# Patient Record
Sex: Female | Born: 1960
Health system: Southern US, Community
[De-identification: ages and names within clinical notes are randomized; demographics above are authoritative.]

## PROBLEM LIST (undated history)

## (undated) DIAGNOSIS — K7460 Unspecified cirrhosis of liver: Secondary | ICD-10-CM

## (undated) DIAGNOSIS — Z87440 Personal history of urinary (tract) infections: Secondary | ICD-10-CM

## (undated) DIAGNOSIS — L0390 Cellulitis, unspecified: Secondary | ICD-10-CM

## (undated) DIAGNOSIS — T7840XA Allergy, unspecified, initial encounter: Secondary | ICD-10-CM

## (undated) DIAGNOSIS — K7400 Hepatic fibrosis, unspecified: Secondary | ICD-10-CM

## (undated) DIAGNOSIS — R161 Splenomegaly, not elsewhere classified: Secondary | ICD-10-CM

## (undated) DIAGNOSIS — K219 Gastro-esophageal reflux disease without esophagitis: Secondary | ICD-10-CM

## (undated) DIAGNOSIS — D649 Anemia, unspecified: Secondary | ICD-10-CM

## (undated) DIAGNOSIS — D869 Sarcoidosis, unspecified: Secondary | ICD-10-CM

## (undated) HISTORY — DX: Cellulitis, unspecified: L03.90

## (undated) HISTORY — DX: Allergy, unspecified, initial encounter: T78.40XA

## (undated) HISTORY — PX: COLONOSCOPY: SHX5424

## (undated) HISTORY — PX: ESOPHAGOGASTRODUODENOSCOPY: SHX1529

## (undated) HISTORY — DX: Hepatic fibrosis, unspecified: K74.00

## (undated) HISTORY — DX: Hepatic fibrosis: K74.0

## (undated) HISTORY — DX: Anemia, unspecified: D64.9

## (undated) HISTORY — PX: LIVER TRANSPLANT: SHX410

---

## 1998-01-03 ENCOUNTER — Other Ambulatory Visit: Admission: RE | Admit: 1998-01-03 | Discharge: 1998-01-03 | Payer: Self-pay | Admitting: Gynecology

## 1998-02-06 ENCOUNTER — Other Ambulatory Visit: Admission: RE | Admit: 1998-02-06 | Discharge: 1998-02-06 | Payer: Self-pay | Admitting: Gynecology

## 2001-01-09 ENCOUNTER — Other Ambulatory Visit: Admission: RE | Admit: 2001-01-09 | Discharge: 2001-01-09 | Payer: Self-pay | Admitting: Gynecology

## 2003-03-03 ENCOUNTER — Other Ambulatory Visit: Admission: RE | Admit: 2003-03-03 | Discharge: 2003-03-03 | Payer: Self-pay | Admitting: Gynecology

## 2007-09-23 ENCOUNTER — Encounter: Admission: RE | Admit: 2007-09-23 | Discharge: 2007-09-23 | Payer: Self-pay | Admitting: Orthopaedic Surgery

## 2007-12-30 HISTORY — PX: SPINAL FUSION: SHX223

## 2008-01-01 ENCOUNTER — Ambulatory Visit (HOSPITAL_COMMUNITY): Admission: RE | Admit: 2008-01-01 | Discharge: 2008-01-02 | Payer: Self-pay | Admitting: Orthopaedic Surgery

## 2008-07-16 ENCOUNTER — Emergency Department (HOSPITAL_BASED_OUTPATIENT_CLINIC_OR_DEPARTMENT_OTHER): Admission: EM | Admit: 2008-07-16 | Discharge: 2008-07-16 | Payer: Self-pay | Admitting: Emergency Medicine

## 2010-10-21 ENCOUNTER — Encounter: Payer: Self-pay | Admitting: Orthopaedic Surgery

## 2011-02-12 NOTE — Op Note (Signed)
Alicia [name], SKYLES             ACCOUNT NO.:  [id number]   MEDICAL RECORD NO.:  [id number]          PATIENT TYPE:  OIB   LOCATION:  2550                         FACILITY:  [facility]   PHYSICIAN:  Mark C. Ophelia Charter, M.D.    DATE OF BIRTH:  1961/09/14   DATE OF PROCEDURE:  01/01/2008  DATE OF DISCHARGE:                               OPERATIVE REPORT   PREOPERATIVE DIAGNOSIS:  Cervical spondylosis C3-4, C4-5 with right  facet arthropathy.   POSTOPERATIVE DIAGNOSIS:  Cervical spondylosis C3-4, C4-5 with right  facet arthropathy.   PROCEDURE:  C3-C4 C4-C5 anterior cervical diskectomy and fusion,  allograft and plate.   SURGEON:  Annell Greening, MD   ASSISTANT:  Maud Deed, PA-C.   ANESTHESIA:  GOT, 7 mL skin local.   ESTIMATED BLOOD LOSS:  Minimal 7 mL.   DRAINS:  One Hemovac neck.   After the induction of general anesthesia and orotracheal intubation,  and head halter traction application, arms tucked to the side with pads  over the ulnar nerve.  The neck was prepped with DuraPrep. A sterile  skin marker was used after squaring the area with towels and Betadine  and Vi-Drape application.  Prominent skin fold was used overlying the  planned level and cricothyroid membrane with carotid tubercle and  fingerbreadths from the clavicle were used for determination of the  appropriate level for the skin incision.  A thyroid sheet and drape was  applied to the sterile Mayo stand at the head.   Incision was made after time-out procedure check list was completed and  the antibiotics were given.  Incision was started at the midline  extending to the left. The platysma was divided in line with the fibers  and a Gelpi retractor was placed.  Prominent thyroid vein was ligated  with 3-0 silk double ligation and then divided.  It was directly over  the operative field.  The longus coli were elevated and teeth retractors  were placed both right and left.  The 4-5 level had the most significant  facet changes and narrowing to the right so it was operatively treated  first.   Smooth blades were placed up and down.  Diskectomy was performed at C4-  5,  disk material was removed and operative microscope was draped and  brought in for the dissection of the posterior aspect of the disk space  and takedown of the posterior longitudinal ligament with exposure of the  dura.  On the right side there was considerable uncovertebral narrowing  and spurring which was causing nerve root compression and spurs were  removed until it was decompressed and there were no extruded disk  fragments.  At the 4-5 level the posterior longitudinal ligament was  taken down all the way across to the left side.  Endplates were repaired  with use of the curettes, small limited use of the bur, and then rasping  sizing with a 6-mm spacer followed by a 7 mm spacer.  The 7 mm lordotic  graft was selected at the 4-5 space and hand traction was applied by the  C.R.N.A. as the graft was  inserted, countersunk 1 mm to 2 mm and was  stable.  Identical procedure was then repeated at the L3-4 level moving  the Cloward self-retaining retractors proximal, taking them out first  and then relocating them underneath the longus coli to protect the  esophagus.  At this level there was posterior longitudinal ligament  thickening, some spurring and less severe narrowing on the right side.  The patient had problems with chronic right neck pain and headaches with  shoulder pain but did not have any cord compression at either level.   Once spurs were removed the posterior longitudinal ligament was kept on  the left side for stability.  There was some epidural venous bleeding. A  patty was placed, waited a few minutes and then rasping trials and then  insertion of a 6-mm lordotic cortical cancellus Biomet graft was  performed.  A 34 mm ViewLock Biomet plate was selected, inserted with  the spike. It was adjusted slightly more  proximal so it was in good  position, checked AP and lateral and then all six screw holes were hand  drilled and 14-mm self-tapping screws were inserted, locked down to the  rings and then checked with final pictures taken under fluoroscopy  documenting the position, appropriate level and good position of the  screw plate as well as graft.   After irrigation with saline solution the operative field was dry.  Hemovac was used with a separate stab incision in-and-out technique, 3-0  Vicryl on the platysma and 4-0 Vicryl subcuticular closure.  Tincture of  Benzoin, Steri-Strips, Marcaine infiltration, 4x4s and tape followed by  a soft cervical collar was then applied.  Instrument count and needle  count was correct.  A check list procedure was completed.      Mark C. Ophelia Charter, M.D.  Electronically Signed     MCY/MEDQ  D:  01/01/2008  T:  01/01/2008  Job:  [id number]

## 2011-06-13 ENCOUNTER — Ambulatory Visit (INDEPENDENT_AMBULATORY_CARE_PROVIDER_SITE_OTHER): Payer: BC Managed Care – PPO | Admitting: Family Medicine

## 2011-06-13 ENCOUNTER — Ambulatory Visit (HOSPITAL_BASED_OUTPATIENT_CLINIC_OR_DEPARTMENT_OTHER)
Admission: RE | Admit: 2011-06-13 | Discharge: 2011-06-13 | Disposition: A | Discharge status: Home / Self Care | Payer: BC Managed Care – PPO | Source: Ambulatory Visit | Attending: Family Medicine | Admitting: Family Medicine

## 2011-06-13 ENCOUNTER — Encounter: Payer: Self-pay | Admitting: Family Medicine

## 2011-06-13 VITALS — BP 116/80 | HR 65 | Temp 98.7°F | Ht 63.5 in | Wt 145.2 lb

## 2011-06-13 DIAGNOSIS — R062 Wheezing: Secondary | ICD-10-CM

## 2011-06-13 DIAGNOSIS — R059 Cough, unspecified: Secondary | ICD-10-CM

## 2011-06-13 DIAGNOSIS — J45909 Unspecified asthma, uncomplicated: Secondary | ICD-10-CM

## 2011-06-13 DIAGNOSIS — R05 Cough: Secondary | ICD-10-CM | POA: Insufficient documentation

## 2011-06-13 DIAGNOSIS — J40 Bronchitis, not specified as acute or chronic: Secondary | ICD-10-CM

## 2011-06-13 DIAGNOSIS — J4550 Severe persistent asthma, uncomplicated: Secondary | ICD-10-CM | POA: Insufficient documentation

## 2011-06-13 DIAGNOSIS — R0989 Other specified symptoms and signs involving the circulatory and respiratory systems: Secondary | ICD-10-CM

## 2011-06-13 DIAGNOSIS — J984 Other disorders of lung: Secondary | ICD-10-CM | POA: Insufficient documentation

## 2011-06-13 MED ORDER — LEVOFLOXACIN 500 MG PO TABS: 500.0000 mg | ORAL_TABLET | Freq: Every day | ORAL | Status: AC

## 2011-06-13 MED ORDER — GUAIFENESIN-CODEINE 100-10 MG/5ML PO SYRP: 5.0000 mL | ORAL_SOLUTION | Freq: Two times a day (BID) | ORAL | Status: DC | PRN

## 2011-06-13 MED ORDER — ALBUTEROL SULFATE (5 MG/ML) 0.5% IN NEBU
2.5000 mg | INHALATION_SOLUTION | Freq: Once | RESPIRATORY_TRACT | Status: AC
  Administered 2011-06-13: 2.5 mg via RESPIRATORY_TRACT

## 2011-06-13 NOTE — Progress Notes (Signed)
  Subjective:     Alicia [name] is a 50 y.o. female who presents for evaluation of asthma. The patient has been previously diagnosed with asthma. @CAPHE @ was diagnosed with asthma at age 60 . The patient has never been admitted to the hospital for asthma. The patient is currently having symptoms / an exacerbation. Current symptoms include dyspnea, non-productive cough and wheezing. Symptoms have been present since a week ago and have been gradually worsening. She denies chest pain and productive cough. Associated symptoms include shortness of breath and wheezing.  This episode appears to have been triggered by dust, food (peanuts, milk, eggs, chocolate, corn and other vegetables), pollens and upper respiratory infection. Treatments tried for the current exacerbation include inhaled corticosteroids and long-acting inhaled beta-adrenergic agonists, which have provided some relief of symptoms. The patient has been having similar episodes for approximately 3 days.  Current Disease Severity [name] has frequent daytime asthma symptoms. She has frequent nighttime asthma symptoms. The patient is using short-acting beta agonists for symptom control several times per day. She has exacerbations requiring oral systemic corticosteroids 0 times per year. Current limitations in activity from asthma: pt has trouble doing anything when she has an exacerbation. Number of days of school or work missed in the last month: 0. Number of urgent/emergent visit in last year: 0   The following portions of the patient's history were reviewed and updated as appropriate: allergies, current medications, past family history, past medical history, past social history, past surgical history and problem list.  Review of Systems Pertinent items are noted in HPI.    Objective:    Oxygen saturation 96% on room air BP 116/80  Pulse 65  Temp(Src) 98.7 F (37.1 C) (Oral)  Ht 5' 3.5" (1.613 m)  Wt 145 lb 3.2 oz (65.862 kg)  BMI 25.32  kg/m2  SpO2 96% General appearance: alert, cooperative, appears stated age and mild distress Head: Normocephalic, without obvious abnormality, atraumatic Eyes: conjunctivae/corneas clear. PERRL, EOM's intact. Fundi benign. Ears: normal TM's and external ear canals both ears Nose: Nares normal. Septum midline. Mucosa normal. No drainage or sinus tenderness. Throat: lips, mucosa, and tongue normal; teeth and gums normal? Neck: no adenopathy, no carotid bruit, no JVD, supple, symmetrical, trachea midline and thyroid not enlarged, symmetric, no tenderness/mass/nodules Lungs: diminished breath sounds LLL and rhonchi LLL Heart: S1, S2 normal Lymph nodes: Cervical, supraclavicular, and axillary nodes normal.    Assessment:    Severe persistent asthma. Apparent precipitants include animal dander, dust, exercise, food (several foods), fumes, pollens, smoke, strong odors and upper respiratory infection. Treatment with albuterol was given in the office. ? pneumonia   Plan:     Review treatment goals of symptom prevention and prevention of exacerbations and use of ER/inpatient care. Medications: discontinue advair and begin qvar 80. Beta-agonist nebulizer treatment given in the office with some relief of symptoms. Discussed distinction between quick-relief and controlled medications. Discussed medication dosage, use, side effects, and goals of treatment in detail.   Discussed avoidance of precipitants. Asthma information handout given. Chest x-ray  levaquin 500 mg 1 po qd ---pt prefers not to use po steroids

## 2011-06-13 NOTE — Patient Instructions (Signed)
Asthma Attacks, Prevention    HOW CAN ASTHMA BE PREVENTED?  Currently, there is no way to prevent asthma from starting. However, you can take steps to control the disease and prevent its symptoms after you have been diagnosed.  Learn about your asthma and how to control it.  Take an active role to control your asthma by working with your caregiver to create and follow an asthma action plan. An asthma action plan guides you in taking your medicines properly, avoiding factors that make your asthma worse, tracking your level of asthma control, responding to worsening asthma, and seeking emergency care when needed. To track your asthma, keep records of your symptoms, check your peak flow number using a peak flow meter (handheld device that shows how well air moves out of your lungs), and get regular asthma checkups.      Other ways to prevent asthma attacks include:   Use medicines as your caregiver directs.    Identify and avoid things that make your asthma worse (as much as you can).    Keep track of your asthma symptoms and level of control.    Get regular checkups for your asthma.    With your caregiver, write a detailed plan for taking medicines and managing an asthma attack. Then be sure to follow your action plan. Asthma is an ongoing condition that needs regular monitoring and treatment.    Identify and avoid asthma triggers. A number of outdoor allergens and irritants (pollen, mold, cold air, air pollution) can trigger asthma attacks. Find out what causes or makes your asthma worse, and take steps to avoid those triggers (see below).    Monitor your breathing. Learn to recognize warning signs of an attack, such as slight coughing, wheezing or shortness of breath. However, your lung function may already decrease before you notice any signs or symptoms, so regularly measure and record your peak airflow with a home peak flow meter.    Identify and treat attacks early. If you act quickly, you're less likely  to have a severe attack. You will also need less medicine to control your symptoms. When your peak flow measurements decrease and alert you to an upcoming attack, take your medicine as instructed, and immediately stop any activity that may have triggered the attack. If your symptoms do not improve, get medical help.    Pay attention to increasing quick-relief inhaler use. If you find yourself relying on your quick-relief inhaler (such as albuterol), your asthma is not under control. See your caregiver about adjusting your treatment.     IDENTIFY AND CONTROL FACTORS THAT MAKE YOUR ASTHMA WORSE  A number of common things can set off or make your asthma symptoms worse (asthma triggers). Keep track of your asthma symptoms for several weeks, detailing all the environmental and emotional factors that are linked with your asthma. When you have an asthma attack, go back to your asthma diary to see which factor, or combination of factors, might have contributed to it. Once you know what these factors are, you can take steps to control many of them.     Allergies: If you have allergies and asthma, it is important to take asthma prevention steps at home. Asthma attacks (worsening of asthma symptoms) can be triggered by allergies, which can cause temporary increased inflammation of your airways. Minimizing contact with the substance to which you are allergic will help prevent an asthma attack.  Animal Dander:    Some people are allergic to the flakes of   skin or dried saliva from animals with fur or feathers. Keep these pets out of your home.    If you can't keep a pet outdoors, keep the pet out of your bedroom and other sleeping areas at all times, and keep the door closed.    Remove carpets and furniture covered with cloth from your home. If that is not possible, keep the pet away from fabric-covered furniture and carpets.   Dust Mites:   Many people with asthma are allergic to dust mites. Dust mites are tiny bugs that  are found in every home, in mattresses, pillows, carpets, fabric-covered furniture, bedcovers, clothes, stuffed toys, fabric, and other fabric-covered items.    Cover your mattress in a special dust-proof cover.    Cover your pillow in a special dust-proof cover, or wash the pillow each week in hot water. Water must be hotter than 130 F to kill dust mites. Cold or warm water used with detergent and bleach can also be effective.    Wash the sheets and blankets on your bed each week in hot water.    Try not to sleep or lie on cloth-covered cushions.    Call ahead when traveling and ask for a smoke-free hotel room. Bring your own bedding and pillows, in case the hotel only supplies feather pillows and down comforters, which may contain dust mites and cause asthma symptoms.   Remove carpets from your bedroom and those laid on concrete, if you can.    Keep stuffed toys out of the bed, or wash the toys weekly in hot water or cooler water with detergent and bleach.   Cockroaches:   Many people with asthma are allergic to the droppings and remains of cockroaches.    Keep food and garbage in closed containers. Never leave food out.    Use poison baits, traps, powders, gels, or paste (for example, boric acid).    If a spray is used to kill cockroaches, stay out of the room until the odor goes away.   Indoor Mold:   Fix leaky faucets, pipes, or other sources of water that have mold around them.    Clean moldy surfaces with a cleaner that has bleach in it.   Pollen and Outdoor Mold:   When pollen or mold spore counts are high, try to keep your windows closed.    Stay indoors with windows closed from late morning to afternoon, if you can. Pollen and some mold spore counts are highest at that time.    Ask your caregiver whether you need to take or increase anti-inflammatory medicine before your allergy season starts.   Irritants:    Tobacco smoke is an irritant. If you smoke, ask your caregiver how you can quit.  Ask family members to quit smoking, too.  Do not allow smoking in your home or car.    If possible, do not use a wood-burning stove, kerosene heater, or fireplace. Minimize exposure to all sources of smoke, including incense, candles, fires, and fireworks.   Try to stay away from strong odors and sprays, such as perfume, talcum powder, hair spray, and paints.    Decrease humidity in your home and use an indoor air cleaning device. Reduce indoor humidity to below 60 percent. Dehumidifiers or central air conditioners can do this.    Try to have someone else vacuum for you once or twice a week, if you can. Stay out of rooms while they are being vacuumed and for a short while afterward.      If you vacuum, use a dust mask from a hardware store, a double-layered or microfilter vacuum cleaner bag, or a vacuum cleaner with a HEPA filter.    Sulfites in foods and beverages can be irritants. Do not drink beer or wine, or eat dried fruit, processed potatoes, or shrimp if they cause asthma symptoms.    Cold air can trigger an asthma attack. Cover your nose and mouth with a scarf on cold or windy days.    Several health conditions can make asthma more difficult to manage, including runny nose, sinus infections, reflux disease, psychological stress, and sleep apnea. Your caregiver will treat these conditions, as well.    Avoid close contact with people who have a cold or the flu, since your asthma symptoms may get worse if you catch the infection from them. Wash your hands thoroughly after touching items that may have been handled by people with a respiratory infection.   Get a flu shot every year to protect against the flu virus, which often makes asthma worse for days or weeks. Also get a pneumonia shot once every five to 10 years.  Drugs:   Aspirin and other painkillers can cause asthma attacks. 10% to 20% of people with asthma have sensitivity to aspirin or a group of painkillers called non-steroidal  anti-inflammatory drugs (NSAIDS), such as ibuprofen and naproxen. These drugs are used to treat pain and reduce fevers.  Asthma attacks caused by any of these medicines can be severe and even fatal. These drugs must be avoided in people who have known aspirin sensitive asthma. Products with acetaminophen are considered safe for people who have asthma. It is important that people with aspirin sensitivity read labels of all over-the-counter drugs used to treat pain, colds, coughs, and fever.   Beta blockers and ACE inhibitors are other drugs which you should discuss with your caregiver, in relation to your asthma.    ALLERGY SKIN TESTING    Ask your asthma caregiver about allergy skin testing or blood testing (RAST test) to identify the allergens to which you are sensitive. If you are found to have allergies, allergy shots (immunotherapy) for asthma may help prevent future allergies and asthma. With allergy shots, small doses of allergens (substances to which you are allergic) are injected under your skin on a regular schedule. Over a period of time, your body may become used to the allergen and less responsive with asthma symptoms. You can also take measures to minimize your exposure to those allergens.    EXERCISE   If you have exercise-induced asthma, or are planning vigorous exercise, or exercise in cold, humid, or dry environments, prevent exercise-induced asthma by following your caregiver's advice regarding asthma treatment before exercising.    Document Released: 09/04/2009    [employer] Patient Information 2011 [employer]

## 2011-06-14 ENCOUNTER — Telehealth: Payer: Self-pay

## 2011-06-14 DIAGNOSIS — R911 Solitary pulmonary nodule: Secondary | ICD-10-CM

## 2011-06-14 NOTE — Telephone Encounter (Signed)
Message copied by Arnette Norris on Fri Jun 14, 2011 10:59 AM ------      Message from: Lelon Perla      Created: Thu Jun 13, 2011  3:41 PM       No pneumonia      Small nodule--- recheck cxr after she is finished with abx=== about 2 weeks

## 2011-06-14 NOTE — Telephone Encounter (Signed)
Discussed with patient and she voiced understanding---- Order put in for CXR in 2 weeks     KP

## 2011-06-24 LAB — COMPREHENSIVE METABOLIC PANEL
AST: 210 — ABNORMAL HIGH
Alkaline Phosphatase: 436 — ABNORMAL HIGH
BUN: 7
CO2: 30
Creatinine, Ser: 0.99
GFR calc Af Amer: >60
Sodium: 139
Total Bilirubin: 1.4 — ABNORMAL HIGH
Total Protein: 7.2

## 2011-06-24 LAB — DIFFERENTIAL
Basophils Absolute: 0
Basophils Relative: 0
Eosinophils Relative: 2
Lymphocytes Relative: 30
Neutro Abs: 2.9

## 2011-06-24 LAB — PROTIME-INR
INR: 1
Prothrombin Time: 13.3

## 2011-06-24 LAB — CBC
Hemoglobin: 14.8
MCHC: 35.2
MCV: 100.5 — ABNORMAL HIGH
WBC: 5

## 2011-06-27 ENCOUNTER — Telehealth: Payer: Self-pay

## 2011-06-27 ENCOUNTER — Ambulatory Visit (HOSPITAL_BASED_OUTPATIENT_CLINIC_OR_DEPARTMENT_OTHER)
Admission: RE | Admit: 2011-06-27 | Discharge: 2011-06-27 | Disposition: A | Discharge status: Home / Self Care | Payer: BC Managed Care – PPO | Source: Ambulatory Visit | Attending: Family Medicine | Admitting: Family Medicine

## 2011-06-27 DIAGNOSIS — R05 Cough: Secondary | ICD-10-CM | POA: Insufficient documentation

## 2011-06-27 DIAGNOSIS — J984 Other disorders of lung: Secondary | ICD-10-CM | POA: Insufficient documentation

## 2011-06-27 DIAGNOSIS — R911 Solitary pulmonary nodule: Secondary | ICD-10-CM

## 2011-06-27 DIAGNOSIS — R059 Cough, unspecified: Secondary | ICD-10-CM

## 2011-06-27 NOTE — Telephone Encounter (Signed)
msg left to call the office     KP 

## 2011-06-27 NOTE — Telephone Encounter (Signed)
Message copied by Arnette Norris on Thu Jun 27, 2011  5:06 PM ------      Message from: Lelon Perla      Created: Thu Jun 27, 2011  1:13 PM       Nodule still evident---  Check CT chest with contrast

## 2011-06-28 ENCOUNTER — Ambulatory Visit (HOSPITAL_BASED_OUTPATIENT_CLINIC_OR_DEPARTMENT_OTHER)
Admission: RE | Admit: 2011-06-28 | Discharge: 2011-06-28 | Disposition: A | Discharge status: Home / Self Care | Payer: BC Managed Care – PPO | Source: Ambulatory Visit | Attending: Family Medicine | Admitting: Family Medicine

## 2011-06-28 DIAGNOSIS — R911 Solitary pulmonary nodule: Secondary | ICD-10-CM

## 2011-06-28 DIAGNOSIS — J984 Other disorders of lung: Secondary | ICD-10-CM | POA: Insufficient documentation

## 2011-06-28 DIAGNOSIS — K7460 Unspecified cirrhosis of liver: Secondary | ICD-10-CM | POA: Insufficient documentation

## 2011-06-28 DIAGNOSIS — K766 Portal hypertension: Secondary | ICD-10-CM

## 2011-06-28 MED ORDER — IOHEXOL 300 MG/ML  SOLN
80.0000 mL | Freq: Once | INTRAMUSCULAR | Status: AC | PRN
  Administered 2011-06-28: 80 mL via INTRAVENOUS

## 2011-06-28 NOTE — Telephone Encounter (Signed)
Discussed with patient and she voiced understanding--orders put in    [place]

## 2011-07-01 ENCOUNTER — Ambulatory Visit (INDEPENDENT_AMBULATORY_CARE_PROVIDER_SITE_OTHER): Payer: BC Managed Care – PPO | Admitting: Family Medicine

## 2011-07-01 ENCOUNTER — Encounter: Payer: Self-pay | Admitting: Family Medicine

## 2011-07-01 VITALS — BP 120/80 | HR 60 | Temp 97.6°F | Wt 140.6 lb

## 2011-07-01 DIAGNOSIS — R911 Solitary pulmonary nodule: Secondary | ICD-10-CM

## 2011-07-01 DIAGNOSIS — J984 Other disorders of lung: Secondary | ICD-10-CM

## 2011-07-01 DIAGNOSIS — D869 Sarcoidosis, unspecified: Secondary | ICD-10-CM | POA: Insufficient documentation

## 2011-07-01 DIAGNOSIS — R918 Other nonspecific abnormal finding of lung field: Secondary | ICD-10-CM

## 2011-07-01 NOTE — Progress Notes (Signed)
  Subjective:    Patient ID: Alicia [name], female    DOB: Dec 30, 1960, 50 y.o.   MRN: [id number]  HPI Pt here to discuss CT.  Her husband is present.  Pt goes to "[employer] [facility] at [employer]  Pt c/o chronic cough for years and never had cxr or pulm w/u per the pt.      Review of Systems As above    Objective:   Physical Exam  Constitutional: She appears well-developed and well-nourished.  Psychiatric: She has a normal mood and affect. Her behavior is normal. Judgment and thought content normal.          Assessment & Plan:

## 2011-07-01 NOTE — Assessment & Plan Note (Signed)
Refer to pulm for further evaluation See CT

## 2011-07-04 ENCOUNTER — Encounter: Payer: Self-pay | Admitting: *Deleted

## 2011-07-04 ENCOUNTER — Ambulatory Visit (INDEPENDENT_AMBULATORY_CARE_PROVIDER_SITE_OTHER): Payer: BC Managed Care – PPO | Admitting: Critical Care Medicine

## 2011-07-04 ENCOUNTER — Encounter: Payer: Self-pay | Admitting: Critical Care Medicine

## 2011-07-04 DIAGNOSIS — R059 Cough, unspecified: Secondary | ICD-10-CM

## 2011-07-04 DIAGNOSIS — R918 Other nonspecific abnormal finding of lung field: Secondary | ICD-10-CM

## 2011-07-04 DIAGNOSIS — K219 Gastro-esophageal reflux disease without esophagitis: Secondary | ICD-10-CM

## 2011-07-04 DIAGNOSIS — J45909 Unspecified asthma, uncomplicated: Secondary | ICD-10-CM

## 2011-07-04 DIAGNOSIS — J382 Nodules of vocal cords: Secondary | ICD-10-CM | POA: Insufficient documentation

## 2011-07-04 DIAGNOSIS — R05 Cough: Secondary | ICD-10-CM

## 2011-07-04 DIAGNOSIS — J383 Other diseases of vocal cords: Secondary | ICD-10-CM

## 2011-07-04 DIAGNOSIS — Q446 Cystic disease of liver: Secondary | ICD-10-CM

## 2011-07-04 MED ORDER — AEROCHAMBER MV MISC: Status: AC

## 2011-07-04 MED ORDER — TRAMADOL HCL 50 MG PO TABS: ORAL_TABLET | ORAL | Status: DC

## 2011-07-04 MED ORDER — TRAMADOL HCL 50 MG PO TABS: ORAL_TABLET | ORAL | Status: AC

## 2011-07-04 MED ORDER — FAMOTIDINE 20 MG PO TABS: 20.0000 mg | ORAL_TABLET | Freq: Every day | ORAL | Status: DC

## 2011-07-04 MED ORDER — BENZONATATE 100 MG PO CAPS: ORAL_CAPSULE | ORAL | Status: DC

## 2011-07-04 MED ORDER — AEROCHAMBER MV MISC: Status: DC

## 2011-07-04 NOTE — Progress Notes (Signed)
Subjective:    Patient ID: Alicia [name], female    DOB: 02-28-1961, 50 y.o.   MRN: [id number]  HPI Comments: Asthma dx for 26yrs.   HAs seen Dr Viann Shove ENT at [facility] for vocal cord nodules/polyps Skin tests/blood tests:  Food allergies, environmental also not as bad  Asthma She complains of cough, difficulty breathing, frequent throat clearing, hoarse voice, shortness of breath and wheezing. There is no chest tightness, hemoptysis or sputum production. Primary symptoms comments: Cough is dry. This is a chronic problem. The current episode started more than 1 year ago. The problem occurs constantly. The problem has been unchanged. The cough is non-productive, barking, croupy, dry, hacking, harsh, hoarse, paroxysmal, vomit inducing, supine and nocturnal. Associated symptoms include chest pain, dyspnea on exertion, headaches, heartburn, malaise/fatigue, postnasal drip, rhinorrhea, sneezing, a sore throat and trouble swallowing. Pertinent negatives include no appetite change, ear congestion, ear pain, fever or myalgias. Her symptoms are aggravated by change in weather, any activity, eating, climbing stairs, exercise, exposure to fumes, exposure to smoke, emotional stress, lying down, pollen, strenuous activity and URI (cold>heat). Her symptoms are alleviated by steroid inhaler and beta-agonist. She reports minimal improvement on treatment. Her past medical history is significant for asthma. There is no history of bronchiectasis, bronchitis, COPD, emphysema or pneumonia.   50 y.o. Chronic cough for years and nodules just found.  Past Medical History  Diagnosis Date  . Anemia   . Asthma   . Allergy   . Hepatic fibrosis      Family History  Problem Relation Age of Onset  . Arthritis Mother   . Heart disease Father     Triple bypass  . Heart attack Paternal Grandmother   . Stroke Paternal Grandfather   . Liver disease Sister     Juvenile cysitc liver disease  . Asthma Sister       History   Social History  . Marital Status: Married    Spouse Name: N/A    Number of Children: N/A  . Years of Education: N/A   Occupational History  . HR Manager    Social History Main Topics  . Smoking status: Never Smoker   . Smokeless tobacco: Never Used  . Alcohol Use: Yes     wine nightly  . Drug Use: No  . Sexually Active: Not on file   Other Topics Concern  . Not on file   Social History Narrative  . No narrative on file     Allergies  Allergen Reactions  . Aspirin Other (See Comments)    Causes Asthma  . Dairy Aid (Lactase)     Causes Asthma attacks  . Eggs Or Egg-Derived Products     Causes Asthma  . Wheat      Outpatient Prescriptions Prior to Visit  Medication Sig Dispense Refill  . albuterol (VENTOLIN HFA) 108 (90 BASE) MCG/ACT inhaler Inhale 2 puffs into the lungs every 6 (six) hours as needed.        . benzonatate (TESSALON) 100 MG capsule Take 1 capsule by mouth as needed.       . furosemide (LASIX) 20 MG tablet Take 1 tablet by mouth daily.       Marland Kitchen guaiFENesin-codeine (ROBITUSSIN AC) 100-10 MG/5ML syrup Take 5 mLs by mouth 2 (two) times daily as needed for cough.  240 mL  0  . nadolol (CORGARD) 40 MG tablet Take 1 tablet by mouth daily.       . pantoprazole (PROTONIX) 40 MG tablet  Take 1 tablet by mouth 2 (two) times daily.       Marland Kitchen spironolactone (ALDACTONE) 25 MG tablet Take 2 tablets by mouth daily.       Marland Kitchen ADVAIR HFA 230-21 MCG/ACT inhaler 2 puffs 2 (two) times daily.          Review of Systems  Constitutional: Positive for chills, malaise/fatigue, diaphoresis and fatigue. Negative for fever, activity change, appetite change and unexpected weight change.  HENT: Positive for congestion, sore throat, hoarse voice, rhinorrhea, sneezing, trouble swallowing, postnasal drip and sinus pressure. Negative for hearing loss, ear pain, nosebleeds, facial swelling, mouth sores, neck pain, neck stiffness, dental problem, voice change, tinnitus and ear  discharge.   Eyes: Positive for itching. Negative for photophobia, discharge and visual disturbance.  Respiratory: Positive for cough, chest tightness, shortness of breath and wheezing. Negative for apnea, hemoptysis, sputum production, choking and stridor.   Cardiovascular: Positive for chest pain and dyspnea on exertion. Negative for palpitations and leg swelling.  Gastrointestinal: Positive for heartburn. Negative for nausea, vomiting, abdominal pain, constipation, blood in stool and abdominal distention.  Genitourinary: Negative for dysuria, urgency, frequency, hematuria, flank pain, decreased urine volume and difficulty urinating.  Musculoskeletal: Positive for back pain. Negative for myalgias, joint swelling, arthralgias and gait problem.  Skin: Negative for color change, pallor and rash.  Neurological: Positive for dizziness, light-headedness and headaches. Negative for tremors, seizures, syncope, speech difficulty, weakness and numbness.  Hematological: Negative for adenopathy. Bruises/bleeds easily.  Psychiatric/Behavioral: Positive for sleep disturbance. Negative for confusion and agitation. The patient is not nervous/anxious.        Objective:   Physical Exam Filed Vitals:   07/04/11 1534  BP: 110/80  Pulse: 60  Temp: 98.1 F (36.7 C)  TempSrc: Oral  Height: 5' 3.5" (1.613 m)  Weight: 139 lb (63.05 kg)  SpO2: 98%    Gen: Pleasant, well-nourished, in no distress,  normal affect  ENT: No lesions,  mouth clear,  oropharynx clear, no postnasal drip  Neck: No JVD, no TMG, no carotid bruits  Lungs: No use of accessory muscles, no dullness to percussion, prominent pseudowheeze  Cardiovascular: RRR, heart sounds normal, no murmur or gallops, no peripheral edema  Abdomen: soft and NT, no HSM,  BS normal  Musculoskeletal: No deformities, no cyanosis or clubbing  Neuro: alert, non focal  Skin: Warm, no lesions or rashes   CT chest 9/27 IMPRESSION:  1. There are small  nonspecific pulmonary nodules identified in  both lungs. These measure up to 5 mm. If there is no known  malignancy I would suggest serial follow-up imaging of these  nodules to establish stability and but may indicate. A first  follow-up examination should be obtained in 3 months.  2. Cirrhosis with portal venous hypertension      Assessment & Plan:   Asthma Cyclical cough on the basis of both upper and lower airway instability with associated reflux disease on a severe basis exacerbated by esophageal stricture. There was evidence of vocal cord dysfunction syndrome with upper airway instability and some wheeze on exam. There is no evidence of sinus disease. There is evidence of lower airway inflammation.  Plan Cyclic cough protocol with tramadol/tessalon Stay on Qvar two puff twice daily, use spacer Add pepcid at bedtime Stay on pantoprazole one 1/2 hour before meals twice daily Follow reflux diet Full pulmonary function studies will be obtained Return 6 weeks   Multiple pulmonary nodules Bilateral pulmonary nodules likely benign and not significant Plan Repeat CT  scan in 6 months    Updated Medication List Outpatient Encounter Prescriptions as of 07/04/2011  Medication Sig Dispense Refill  . albuterol (VENTOLIN HFA) 108 (90 BASE) MCG/ACT inhaler Inhale 2 puffs into the lungs every 6 (six) hours as needed.        . beclomethasone (QVAR) 80 MCG/ACT inhaler Inhale 2 puffs into the lungs 2 (two) times daily.        . benzonatate (TESSALON) 100 MG capsule Take per cough protocol 1-2 every 4 hours  90 capsule  4  . furosemide (LASIX) 20 MG tablet Take 1 tablet by mouth daily.       Marland Kitchen guaiFENesin-codeine (ROBITUSSIN AC) 100-10 MG/5ML syrup Take 5 mLs by mouth 2 (two) times daily as needed for cough.  240 mL  0  . nadolol (CORGARD) 40 MG tablet Take 1 tablet by mouth daily.       . pantoprazole (PROTONIX) 40 MG tablet Take 1 tablet by mouth 2 (two) times daily.       Marland Kitchen  spironolactone (ALDACTONE) 25 MG tablet Take 2 tablets by mouth daily.       Marland Kitchen DISCONTD: benzonatate (TESSALON) 100 MG capsule Take 1 capsule by mouth as needed.       Marland Kitchen DISCONTD: benzonatate (TESSALON) 100 MG capsule Take per cough protocol 1-2 every 4 hours  90 capsule  4  . famotidine (PEPCID) 20 MG tablet Take 1 tablet (20 mg total) by mouth at bedtime.  30 tablet  3  . Spacer/Aero-Holding Chambers (AEROCHAMBER MV) inhaler Use as instructed  1 each  0  . traMADol (ULTRAM) 50 MG tablet Take per cough protocol  1-2 every 4 hour as needed  60 tablet  4  . DISCONTD: ADVAIR HFA 230-21 MCG/ACT inhaler 2 puffs 2 (two) times daily.       Marland Kitchen DISCONTD: famotidine (PEPCID) 20 MG tablet Take 1 tablet (20 mg total) by mouth at bedtime.  30 tablet  3  . DISCONTD: Spacer/Aero-Holding Chambers (AEROCHAMBER MV) inhaler Use as instructed  1 each  0  . DISCONTD: traMADol (ULTRAM) 50 MG tablet Take per cough protocol  1-2 every 4 hour as needed  60 tablet  4

## 2011-07-04 NOTE — Patient Instructions (Addendum)
Cyclic cough protocol with tramadol/tessalon Stay on Qvar two puff twice daily, use spacer Add pepcid at bedtime Stay on pantoprazole one 1/2 hour before meals twice daily Follow reflux diet Nodules are benign, repeat CT will be done in 6 months Full pulmonary function studies will be obtained Return 6 weeks Records from your former lung doctor will be obtained

## 2011-07-05 NOTE — Assessment & Plan Note (Signed)
Cyclical cough on the basis of both upper and lower airway instability with associated reflux disease on a severe basis exacerbated by esophageal stricture. There was evidence of vocal cord dysfunction syndrome with upper airway instability and some wheeze on exam. There is no evidence of sinus disease. There is evidence of lower airway inflammation.  Plan Cyclic cough protocol with tramadol/tessalon Stay on Qvar two puff twice daily, use spacer Add pepcid at bedtime Stay on pantoprazole one 1/2 hour before meals twice daily Follow reflux diet Full pulmonary function studies will be obtained Return 6 weeks

## 2011-07-05 NOTE — Assessment & Plan Note (Signed)
Bilateral pulmonary nodules likely benign and not significant Plan Repeat CT scan in 6 months

## 2011-07-12 LAB — HM COLONOSCOPY

## 2011-07-16 ENCOUNTER — Ambulatory Visit (INDEPENDENT_AMBULATORY_CARE_PROVIDER_SITE_OTHER): Payer: BC Managed Care – PPO | Admitting: Critical Care Medicine

## 2011-07-16 DIAGNOSIS — J45909 Unspecified asthma, uncomplicated: Secondary | ICD-10-CM

## 2011-07-16 LAB — PULMONARY FUNCTION TEST

## 2011-07-16 NOTE — Progress Notes (Signed)
PFT done today. 

## 2011-07-22 ENCOUNTER — Telehealth: Payer: Self-pay | Admitting: Critical Care Medicine

## 2011-07-22 NOTE — Telephone Encounter (Signed)
PFTs show obstruction. Pt on Qvar

## 2011-07-23 ENCOUNTER — Telehealth: Payer: Self-pay | Admitting: Critical Care Medicine

## 2011-07-23 NOTE — Telephone Encounter (Signed)
LMOMTCB 1 sample has been left up front of qvar 80.

## 2011-07-24 NOTE — Telephone Encounter (Signed)
Called and spoke with pt.  Pt aware sample left at front desk for her to pick up.

## 2011-07-25 ENCOUNTER — Encounter: Payer: Self-pay | Admitting: Critical Care Medicine

## 2011-08-15 ENCOUNTER — Encounter: Payer: Self-pay | Admitting: Critical Care Medicine

## 2011-08-15 ENCOUNTER — Ambulatory Visit (INDEPENDENT_AMBULATORY_CARE_PROVIDER_SITE_OTHER): Payer: BC Managed Care – PPO | Admitting: Critical Care Medicine

## 2011-08-15 DIAGNOSIS — J45909 Unspecified asthma, uncomplicated: Secondary | ICD-10-CM

## 2011-08-15 MED ORDER — FORMOTEROL FUMARATE 12 MCG IN CAPS: 12.0000 ug | ORAL_CAPSULE | Freq: Two times a day (BID) | RESPIRATORY_TRACT | Status: DC

## 2011-08-15 MED ORDER — PREDNISONE 10 MG PO TABS: ORAL_TABLET | ORAL | Status: DC

## 2011-08-15 MED ORDER — BECLOMETHASONE DIPROPIONATE 80 MCG/ACT IN AERS: 4.0000 | INHALATION_SPRAY | Freq: Two times a day (BID) | RESPIRATORY_TRACT | Status: DC

## 2011-08-15 NOTE — Progress Notes (Signed)
Subjective:    Patient ID: Alicia [name], female    DOB: [birth year], 50 y.o.   MRN: [id number]  HPI Comments: Asthma dx for 55yrs.   HAs seen Dr Viann Shove ENT at [facility] for vocal cord nodules/polyps Skin tests/blood tests:  Food allergies, environmental also not as bad  Asthma She complains of cough, difficulty breathing, frequent throat clearing, hoarse voice, shortness of breath and wheezing. There is no chest tightness, hemoptysis or sputum production. Primary symptoms comments: Cough is dry. This is a chronic problem. The current episode started more than 1 year ago. The problem occurs constantly. The problem has been unchanged. The cough is non-productive, barking, croupy, dry, hacking, harsh, hoarse, paroxysmal, vomit inducing, supine and nocturnal. Associated symptoms include chest pain, dyspnea on exertion, headaches, heartburn, malaise/fatigue, postnasal drip, rhinorrhea, sneezing, a sore throat and trouble swallowing. Pertinent negatives include no appetite change, ear congestion, ear pain, fever or myalgias. Her symptoms are aggravated by change in weather, any activity, eating, climbing stairs, exercise, exposure to fumes, exposure to smoke, emotional stress, lying down, pollen, strenuous activity and URI (cold>heat). Her symptoms are alleviated by steroid inhaler and beta-agonist. She reports minimal improvement on treatment. Her past medical history is significant for asthma. There is no history of bronchiectasis, bronchitis, COPD, emphysema or pneumonia.   50 y.o. Chronic cough for years and nodules just found.  08/16/2011  At last ov we rec Cyclic cough protocol with tramadol/tessalon Stay on Qvar two puff twice daily, use spacer Add pepcid at bedtime Stay on pantoprazole one 1/2 hour before meals twice daily Follow reflux diet Full pulmonary function studies will be obtained Still awakens at night,  Still dyspneic esp after eating.  Cough is dry.  Still wheezes.  Now on  two puff twice. Is using the spacer device.    No peak flow meter in use.  Past Medical History  Diagnosis Date  . Anemia   . Asthma   . Allergy   . Hepatic fibrosis      Family History  Problem Relation Age of Onset  . Arthritis Mother   . Heart disease Father     Triple bypass  . Heart attack Paternal Grandmother   . Stroke Paternal Grandfather   . Liver disease Sister     Juvenile cysitc liver disease  . Asthma Sister      History   Social History  . Marital Status: Married    Spouse Name: N/A    Number of Children: N/A  . Years of Education: N/A   Occupational History  . HR Manager    Social History Main Topics  . Smoking status: Never Smoker   . Smokeless tobacco: Never Used  . Alcohol Use: Yes     wine nightly  . Drug Use: No  . Sexually Active: Not on file   Other Topics Concern  . Not on file   Social History Narrative  . No narrative on file     Allergies  Allergen Reactions  . Aspirin Other (See Comments)    Causes Asthma  . Dairy Aid (Lactase)     Causes Asthma attacks  . Eggs Or Egg-Derived Products     Causes Asthma  . Wheat      Outpatient Prescriptions Prior to Visit  Medication Sig Dispense Refill  . albuterol (VENTOLIN HFA) 108 (90 BASE) MCG/ACT inhaler Inhale 2 puffs into the lungs every 6 (six) hours as needed.        . benzonatate (TESSALON)  100 MG capsule Take per cough protocol 1-2 every 4 hours  90 capsule  4  . furosemide (LASIX) 20 MG tablet Take 1 tablet by mouth daily.       Marland Kitchen guaiFENesin-codeine (ROBITUSSIN AC) 100-10 MG/5ML syrup Take 5 mLs by mouth 2 (two) times daily as needed for cough.  240 mL  0  . nadolol (CORGARD) 40 MG tablet Take 1 tablet by mouth daily.       . pantoprazole (PROTONIX) 40 MG tablet Take 1 tablet by mouth 2 (two) times daily.       Marland Kitchen Spacer/Aero-Holding Chambers (AEROCHAMBER MV) inhaler Use as instructed  1 each  0  . spironolactone (ALDACTONE) 25 MG tablet Take 2 tablets by mouth daily.        . beclomethasone (QVAR) 80 MCG/ACT inhaler Inhale 2 puffs into the lungs 2 (two) times daily.        . famotidine (PEPCID) 20 MG tablet Take 1 tablet (20 mg total) by mouth at bedtime.  30 tablet  3     Review of Systems  Constitutional: Positive for chills, malaise/fatigue, diaphoresis and fatigue. Negative for fever, activity change, appetite change and unexpected weight change.  HENT: Positive for congestion, sore throat, hoarse voice, rhinorrhea, sneezing, trouble swallowing, postnasal drip and sinus pressure. Negative for hearing loss, ear pain, nosebleeds, facial swelling, mouth sores, neck pain, neck stiffness, dental problem, voice change, tinnitus and ear discharge.   Eyes: Positive for itching. Negative for photophobia, discharge and visual disturbance.  Respiratory: Positive for cough, chest tightness, shortness of breath and wheezing. Negative for apnea, hemoptysis, sputum production, choking and stridor.   Cardiovascular: Positive for chest pain and dyspnea on exertion. Negative for palpitations and leg swelling.  Gastrointestinal: Positive for heartburn. Negative for nausea, vomiting, abdominal pain, constipation, blood in stool and abdominal distention.  Genitourinary: Negative for dysuria, urgency, frequency, hematuria, flank pain, decreased urine volume and difficulty urinating.  Musculoskeletal: Positive for back pain. Negative for myalgias, joint swelling, arthralgias and gait problem.  Skin: Negative for color change, pallor and rash.  Neurological: Positive for dizziness, light-headedness and headaches. Negative for tremors, seizures, syncope, speech difficulty, weakness and numbness.  Hematological: Negative for adenopathy. Bruises/bleeds easily.  Psychiatric/Behavioral: Positive for sleep disturbance. Negative for confusion and agitation. The patient is not nervous/anxious.        Objective:   Physical Exam  Filed Vitals:   08/15/11 1459  BP: 110/80  Pulse: 64    Temp: 98.2 F (36.8 C)  TempSrc: Oral  Height: 5' 3.5" (1.613 m)  Weight: 137 lb (62.143 kg)  SpO2: 98%    Gen: Pleasant, well-nourished, in no distress,  normal affect  ENT: No lesions,  mouth clear,  oropharynx clear, no postnasal drip  Neck: No JVD, no TMG, no carotid bruits  Lungs: No use of accessory muscles, no dullness to percussion, prominent pseudowheeze but improved  Cardiovascular: RRR, heart sounds normal, no murmur or gallops, no peripheral edema  Abdomen: soft and NT, no HSM,  BS normal  Musculoskeletal: No deformities, no cyanosis or clubbing  Neuro: alert, non focal  Skin: Warm, no lesions or rashes   CT chest 9/27 IMPRESSION:  1. There are small nonspecific pulmonary nodules identified in  both lungs. These measure up to 5 mm. If there is no known  malignancy I would suggest serial follow-up imaging of these  nodules to establish stability and but may indicate. A first  follow-up examination should be obtained in 3 months.  2. Cirrhosis with portal venous hypertension      Assessment & Plan:   Asthma Moderate persistent asthma with associated cough variation exacerbated by upper airway instability and associated reflux disease PFTs 10/16:  FeV1 63%  Fef 25 75 31%  TLC 120%  DLCO 77% Records from [facility] obtained and reviewed the patient has a multifactorial cough for upper airway obstruction vocal cord polyp irritable larynx and also lower airway inflammation. Cough had improved after vocal cord polyp removal and Advair use however the patient noted increased upper airway irritation with the Advair doses. The patient was maintained on proton pump inhibitors in the last office visit at the asthma center is documented April 2012 note pulmonary functions in September 2011 showed FEV1 to [facility] ratio of 55% FVC of 79% predicted FEV1 of 56% predicted FEF 25 7526% predicted normal DLCO mild restriction and 10% improvement in FEV1 after bronchodilator  cough control in this patient at that time was being achieved with amitriptyline and Neurontin use.  Please  note the patient has been on Naldolol  40 mg daily for her liver disease and portal hypertension  On current visit the patient has some improvement but was still having active cough and wheezing. We reviewed her inhaler technique is quite good Plan Prednisone in a tapering dose Continued use of Qvar but daughter ventilations twice daily 80 mcg strength Add Foradil one capsule twice daily Avoid  Advair product Continue GERD treatment Nadolol use  is a concern but will maintain at this time due to liver disease Continue to use tramadol at night for cough control     Updated Medication List Outpatient Encounter Prescriptions as of 08/15/2011  Medication Sig Dispense Refill  . albuterol (VENTOLIN HFA) 108 (90 BASE) MCG/ACT inhaler Inhale 2 puffs into the lungs every 6 (six) hours as needed.        . beclomethasone (QVAR) 80 MCG/ACT inhaler Inhale 4 puffs into the lungs 2 (two) times daily.  1 Inhaler    . benzonatate (TESSALON) 100 MG capsule Take per cough protocol 1-2 every 4 hours  90 capsule  4  . furosemide (LASIX) 20 MG tablet Take 1 tablet by mouth daily.       Marland Kitchen guaiFENesin-codeine (ROBITUSSIN AC) 100-10 MG/5ML syrup Take 5 mLs by mouth 2 (two) times daily as needed for cough.  240 mL  0  . nadolol (CORGARD) 40 MG tablet Take 1 tablet by mouth daily.       . pantoprazole (PROTONIX) 40 MG tablet Take 1 tablet by mouth 2 (two) times daily.       Marland Kitchen Spacer/Aero-Holding Chambers (AEROCHAMBER MV) inhaler Use as instructed  1 each  0  . spironolactone (ALDACTONE) 25 MG tablet Take 2 tablets by mouth daily.       Marland Kitchen DISCONTD: beclomethasone (QVAR) 80 MCG/ACT inhaler Inhale 2 puffs into the lungs 2 (two) times daily.        . famotidine (PEPCID) 20 MG tablet Take 1 tablet (20 mg total) by mouth at bedtime.  30 tablet  3  . formoterol (FORADIL AEROLIZER) 12 MCG capsule for inhaler Place  1 capsule (12 mcg total) into inhaler and inhale 2 (two) times daily.  60 capsule  12  . predniSONE (DELTASONE) 10 MG tablet Take 4 for three days 3 for three days 2 for three days 1 for three days and stop  30 tablet  0

## 2011-08-15 NOTE — Patient Instructions (Addendum)
Prednisone 10mg  Take 4 for three days 3 for three days 2 for three days 1 for three days and stop Start Foradil one capsule twice daily Increase Qvar to 4 puff twice daily Use tramadol at night as needed for cough and tessalon during the day Return 3 weeks [place] Use peak flow meter twice daily, record results in diary

## 2011-08-16 NOTE — Assessment & Plan Note (Addendum)
Moderate persistent asthma with associated cough variation exacerbated by upper airway instability and associated reflux disease PFTs 10/16:  FeV1 63%  Fef 25 75 31%  TLC 120%  DLCO 77% Records from [facility] obtained and reviewed the patient has a multifactorial cough for upper airway obstruction vocal cord polyp irritable larynx and also lower airway inflammation. Cough had improved after vocal cord polyp removal and Advair use however the patient noted increased upper airway irritation with the Advair doses. The patient was maintained on proton pump inhibitors in the last office visit at the asthma center is documented April 2012 note pulmonary functions in September 2011 showed FEV1 to [facility] ratio of 55% FVC of 79% predicted FEV1 of 56% predicted FEF 25 7526% predicted normal DLCO mild restriction and 10% improvement in FEV1 after bronchodilator cough control in this patient at that time was being achieved with amitriptyline and Neurontin use.  Please  note the patient has been on Naldolol  40 mg daily for her liver disease and portal hypertension  On current visit the patient has some improvement but was still having active cough and wheezing. We reviewed her inhaler technique is quite good Plan Prednisone in a tapering dose Continued use of Qvar but daughter ventilations twice daily 80 mcg strength Add Foradil one capsule twice daily Avoid  Advair product Continue GERD treatment Nadolol use  is a concern but will maintain at this time due to liver disease Continue to use tramadol at night for cough control

## 2011-09-04 DIAGNOSIS — J449 Chronic obstructive pulmonary disease, unspecified: Secondary | ICD-10-CM | POA: Insufficient documentation

## 2011-09-04 DIAGNOSIS — K766 Portal hypertension: Secondary | ICD-10-CM | POA: Insufficient documentation

## 2011-09-05 ENCOUNTER — Ambulatory Visit (INDEPENDENT_AMBULATORY_CARE_PROVIDER_SITE_OTHER): Payer: BC Managed Care – PPO | Admitting: Critical Care Medicine

## 2011-09-05 ENCOUNTER — Encounter: Payer: Self-pay | Admitting: Critical Care Medicine

## 2011-09-05 DIAGNOSIS — J45909 Unspecified asthma, uncomplicated: Secondary | ICD-10-CM

## 2011-09-05 MED ORDER — MAGIC MOUTHWASH: 15.0000 mL | Freq: Three times a day (TID) | ORAL | Status: DC

## 2011-09-05 MED ORDER — BECLOMETHASONE DIPROPIONATE 80 MCG/ACT IN AERS: 2.0000 | INHALATION_SPRAY | Freq: Two times a day (BID) | RESPIRATORY_TRACT | Status: DC

## 2011-09-05 NOTE — Progress Notes (Signed)
Subjective:    Patient ID: Alicia [name], female    DOB: 1961-02-23, 50 y.o.   MRN: [id number]  HPI Comments: Asthma dx for 16yrs.   HAs seen Dr Viann Shove ENT at [facility] for vocal cord nodules/polyps Skin tests/blood tests:  Food allergies, environmental also not as bad  50 y.o. Chronic cough for years and nodules just found.  11/15 At last ov we rec Cyclic cough protocol with tramadol/tessalon Stay on Qvar two puff twice daily, use spacer Add pepcid at bedtime Stay on pantoprazole one 1/2 hour before meals twice daily Follow reflux diet Full pulmonary function studies will be obtained Still awakens at night,  Still dyspneic esp after eating.  Cough is dry.  Still wheezes.  Now on two puff twice. Is using the spacer device.    No peak flow meter in use.  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 Cough had improved after vocal cord polyp removal and Advair use however the patient noted increased upper airway irritation with the Advair doses. The patient was maintained on proton pump inhibitors in the last office visit at the asthma center is documented April 2012 note pulmonary functions in September 2011 showed FEV1 to [facility] ratio of 55% FVC of 79% predicted FEV1 of 56% predicted FEF 25 7526% predicted normal DLCO mild restriction and 10% improvement in FEV1 after bronchodilator cough control in this patient at that time was being achieved with amitriptyline and Neurontin use.  Please  note the patient has been on Naldolol  40 mg daily for her liver disease and portal hypertension  We recommended: Prednisone in a  tapering dose Continued use of Qvar Two ventilations twice daily 80 mcg strength Add Foradil one capsule twice daily Avoid  Advair product Continue GERD treatment Nadolol use  is a concern but will maintain at this time due to liver disease Continue to use tramadol at night for cough control Does not some better,  While on inhaler , mouth bad with thrush.   Does use tramadol at night but still wakes up at 4am and will start coughing.   Cough is still dry.  Overall does feel better and energetic.  Achilles Dunk  Fax [phone] No real wheeze.  PFR 110-150 at first, now 250-300, worse as day wears on Past Medical History  Diagnosis Date  . Anemia   . Asthma   . Allergy   . Hepatic fibrosis      Family History  Problem Relation Age of Onset  . Arthritis Mother   . Heart disease Father     Triple bypass  . Heart attack Paternal Grandmother   . Stroke Paternal Grandfather   . Liver disease Sister     Juvenile cysitc liver disease  . Asthma Sister      History   Social History  . Marital Status: Married    Spouse Name: N/A    Number of Children: N/A  . Years of Education: N/A   Occupational History  . HR Manager    Social History Main Topics  . Smoking status: Never Smoker   . Smokeless tobacco: Never Used  . Alcohol Use: Yes  wine nightly  . Drug Use: No  . Sexually Active: Not on file   Other Topics Concern  . Not on file   Social History Narrative  . No narrative on file     Allergies  Allergen Reactions  . Aspirin Other (See Comments)    Causes Asthma  . Dairy Aid (Lactase)     Causes Asthma attacks  . Eggs Or Egg-Derived Products     Causes Asthma  . Chocolate     Asthma attack, hives  . Peanut-Containing Drug Products     Asthma, eye swelling, itchy eyes, can't catch her breath  . Wheat     Asthma, hives     Outpatient Prescriptions Prior to Visit  Medication Sig Dispense Refill  . albuterol (VENTOLIN HFA) 108 (90 BASE) MCG/ACT inhaler  Inhale 2 puffs into the lungs every 6 (six) hours as needed.        . benzonatate (TESSALON) 100 MG capsule Take per cough protocol 1-2 every 4 hours  90 capsule  4  . formoterol (FORADIL AEROLIZER) 12 MCG capsule for inhaler Place 1 capsule (12 mcg total) into inhaler and inhale 2 (two) times daily.  60 capsule  12  . furosemide (LASIX) 20 MG tablet Take 1 tablet by mouth daily.       . pantoprazole (PROTONIX) 40 MG tablet Take 1 tablet by mouth 2 (two) times daily.       Marland Kitchen Spacer/Aero-Holding Chambers (AEROCHAMBER MV) inhaler Use as instructed  1 each  0  . spironolactone (ALDACTONE) 25 MG tablet Take 2 tablets by mouth daily.       . beclomethasone (QVAR) 80 MCG/ACT inhaler Inhale 4 puffs into the lungs 2 (two) times daily.  1 Inhaler    . nadolol (CORGARD) 40 MG tablet Take 1 tablet by mouth daily.       . famotidine (PEPCID) 20 MG tablet Take 1 tablet (20 mg total) by mouth at bedtime.  30 tablet  3  . guaiFENesin-codeine (ROBITUSSIN AC) 100-10 MG/5ML syrup Take 5 mLs by mouth 2 (two) times daily as needed for cough.  240 mL  0  . predniSONE (DELTASONE) 10 MG tablet Take 4 for three days 3 for three days 2 for three days 1 for three days and stop  30 tablet  0     Review of Systems  Constitutional: Positive for chills, diaphoresis and fatigue. Negative for activity change and unexpected weight change.  HENT: Positive for congestion and sinus pressure. Negative for hearing loss, nosebleeds, facial swelling, mouth sores, neck pain, neck stiffness, dental problem, voice change, tinnitus and ear discharge.   Eyes: Positive for itching. Negative for photophobia, discharge and visual disturbance.  Respiratory: Positive for chest tightness. Negative for apnea, choking and stridor.   Cardiovascular: Negative for palpitations and leg swelling.  Gastrointestinal: Negative for nausea, vomiting, abdominal pain, constipation, blood in stool and abdominal distention.  Genitourinary: Negative for  dysuria, urgency, frequency, hematuria, flank pain, decreased urine volume and difficulty urinating.  Musculoskeletal: Positive for back pain. Negative for joint swelling, arthralgias and gait problem.  Skin: Negative for color change, pallor and rash.  Neurological: Positive for dizziness and light-headedness. Negative for tremors, seizures, syncope, speech difficulty, weakness and numbness.  Hematological: Negative for adenopathy. Bruises/bleeds easily.  Psychiatric/Behavioral: Positive for sleep disturbance. Negative for confusion and agitation. The patient is not nervous/anxious.        Objective:   Physical Exam  Filed Vitals:   09/05/11 1539  BP:  120/82  Pulse: 65  Temp: 98 F (36.7 C)  Height: 5' 3.5" (1.613 m)  Weight: 136 lb 8 oz (61.916 kg)  SpO2: 98%    Gen: Pleasant, well-nourished, in no distress,  normal affect  ENT: No lesions,  mouth clear,  oropharynx clear, no postnasal drip  Neck: No JVD, no TMG, no carotid bruits  Lungs: No use of accessory muscles, no dullness to percussion, less prominent pseudowheeze   Cardiovascular: RRR, heart sounds normal, no murmur or gallops, no peripheral edema  Abdomen: soft and NT, no HSM,  BS normal  Musculoskeletal: No deformities, no cyanosis or clubbing  Neuro: alert, non focal  Skin: Warm, no lesions or rashes   CT chest 9/27 IMPRESSION:  1. There are small nonspecific pulmonary nodules identified in  both lungs. These measure up to 5 mm. If there is no known  malignancy I would suggest serial follow-up imaging of these  nodules to establish stability and but may indicate. A first  follow-up examination should be obtained in 3 months.  2. 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 Cough had improved after vocal cord polyp removal and Advair use however the patient noted increased upper airway irritation with the Advair doses. The patient was maintained on proton pump inhibitors in the last office visit at the asthma center is documented April 2012 note pulmonary functions in September 2011 showed FEV1 to [facility] ratio of 55% FVC of 79% predicted FEV1 of 56% predicted FEF 25 7526% predicted normal DLCO mild restriction and 10% improvement in FEV1 after bronchodilator cough control in this patient at that time was being achieved with amitriptyline and Neurontin use.  Please  note the patient has been on Naldolol  40 mg daily for her liver disease and portal hypertension  Now improved with foradil. PFR up to 300 Still an issue with non selective beta blocker use Case discussed with dr Achilles Dunk of GI at [facility] who is ok with a trial off corgard. She did not have varicies at the last EGD this year. Note oral thrush needs control Plan Stop Corgard/Naldolol Magic mouth wash swish gargle expectorate three times daily, sent to pharmacy Reduce Qvar to two puff twice daily No other medication changes Return 3-4 weeks        Updated Medication List Outpatient Encounter Prescriptions as of 09/05/2011  Medication Sig Dispense Refill  . albuterol (VENTOLIN HFA) 108 (90 BASE) MCG/ACT inhaler Inhale 2 puffs into the lungs every 6 (six) hours as needed.        . beclomethasone (QVAR) 80 MCG/ACT inhaler Inhale 2 puffs into the lungs 2 (two) times daily.  1 Inhaler    . benzonatate (TESSALON) 100 MG capsule Take per cough protocol 1-2 every 4 hours  90 capsule  4  . formoterol (FORADIL AEROLIZER) 12 MCG capsule for inhaler Place 1 capsule (12 mcg total) into inhaler and inhale 2 (two) times daily.  60  capsule  12  . furosemide (LASIX) 20 MG tablet Take 1 tablet by mouth daily.       . pantoprazole (PROTONIX) 40  MG tablet Take 1 tablet by mouth 2 (two) times daily.       Marland Kitchen Spacer/Aero-Holding Chambers (AEROCHAMBER MV) inhaler Use as instructed  1 each  0  . spironolactone (ALDACTONE) 25 MG tablet Take 2 tablets by mouth daily.       . traMADol (ULTRAM) 50 MG tablet 2 at night      . DISCONTD: beclomethasone (QVAR) 80 MCG/ACT inhaler Inhale 4 puffs into the lungs 2 (two) times daily.  1 Inhaler    . DISCONTD: nadolol (CORGARD) 40 MG tablet Take 1 tablet by mouth daily.       . Alum & Mag Hydroxide-Simeth (MAGIC MOUTHWASH) SOLN Take 15 mLs by mouth 3 (three) times daily. Swish gargle expectorate  180 mL  4  . DISCONTD: Alum & Mag Hydroxide-Simeth (MAGIC MOUTHWASH) SOLN Take 15 mLs by mouth 3 (three) times daily. Swish gargle expectorate  180 mL  4  . DISCONTD: famotidine (PEPCID) 20 MG tablet Take 1 tablet (20 mg total) by mouth at bedtime.  30 tablet  3  . DISCONTD: guaiFENesin-codeine (ROBITUSSIN AC) 100-10 MG/5ML syrup Take 5 mLs by mouth 2 (two) times daily as needed for cough.  240 mL  0  . DISCONTD: predniSONE (DELTASONE) 10 MG tablet Take 4 for three days 3 for three days 2 for three days 1 for three days and stop  30 tablet  0

## 2011-09-05 NOTE — Patient Instructions (Addendum)
Stop Corgard/Naldolol Magic mouth wash swish gargle expectorate three times daily, sent to pharmacy Reduce Qvar to two puff twice daily No other medication changes Return 3-4 weeks

## 2011-09-05 NOTE — Assessment & Plan Note (Addendum)
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 Cough had improved after vocal cord polyp removal and Advair use however the patient noted increased upper airway irritation with the Advair doses. The patient was maintained on proton pump inhibitors in the last office visit at the asthma center is documented April 2012 note pulmonary functions in September 2011 showed FEV1 to [facility] ratio of 55% FVC of 79% predicted FEV1 of 56% predicted FEF 25 7526% predicted normal DLCO mild restriction and 10% improvement in FEV1 after bronchodilator cough control in this patient at that time was being achieved with amitriptyline and Neurontin use.  Please  note the patient has been on Naldolol  40 mg daily for her liver disease and portal hypertension  Now improved with foradil. PFR up to 300 Still an issue with non selective beta blocker use Case discussed with dr Achilles Dunk of GI at [facility] who is ok with a trial off corgard. She did not have varicies at the last EGD this year. Note oral thrush needs control Plan Stop Corgard/Naldolol Magic mouth wash swish gargle expectorate three times daily, sent to pharmacy Reduce Qvar to two puff twice daily No other medication changes Return 3-4 weeks

## 2011-09-18 ENCOUNTER — Telehealth: Payer: Self-pay | Admitting: Critical Care Medicine

## 2011-09-19 NOTE — Telephone Encounter (Signed)
[email or web address]  

## 2011-09-20 MED ORDER — BECLOMETHASONE DIPROPIONATE 80 MCG/ACT IN AERS: 2.0000 | INHALATION_SPRAY | Freq: Two times a day (BID) | RESPIRATORY_TRACT | Status: DC

## 2011-09-20 NOTE — Telephone Encounter (Signed)
Pt states she never received prescription to pharmacy. Sent in Qvar 80 to [facility] with refills. Nothing further needed at this time.

## 2011-10-03 ENCOUNTER — Ambulatory Visit (INDEPENDENT_AMBULATORY_CARE_PROVIDER_SITE_OTHER): Payer: BC Managed Care – PPO | Admitting: Critical Care Medicine

## 2011-10-03 ENCOUNTER — Encounter: Payer: Self-pay | Admitting: Critical Care Medicine

## 2011-10-03 DIAGNOSIS — J45909 Unspecified asthma, uncomplicated: Secondary | ICD-10-CM

## 2011-10-03 NOTE — Patient Instructions (Signed)
No change in medications. Return in         4 months 

## 2011-10-03 NOTE — Assessment & Plan Note (Addendum)
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 Cough had improved after vocal cord polyp removal and Advair use however the patient noted increased upper airway irritation with the Advair doses. The patient was maintained on proton pump inhibitors in the last office visit at the asthma center is documented April 2012 note pulmonary functions in September 2011 showed FEV1 to [facility] ratio of 55% FVC of 79% predicted FEV1 of 56% predicted FEF 25 7526% predicted normal DLCO mild restriction and 10% improvement in FEV1 after bronchodilator cough control in this patient at that time was being achieved with amitriptyline and Neurontin use.  Please  note the patient has been on Naldolol  40 mg daily for her liver disease and portal hypertension  Now improved with foradil. PFR up to 300 Pt also much improved with getting off naldolol.  No recurrent GI bleeding or varix issues.   Plan No change in inhaled or maintenance medications. Return in  4 months

## 2011-10-03 NOTE — Progress Notes (Signed)
Subjective:    Patient ID: Alicia [name], female    DOB: [birth year], 51 y.o.   MRN: [id number]  HPI Comments: Asthma dx for 50yrs.   HAs seen Dr Viann Shove ENT at [facility] for vocal cord nodules/polyps Skin tests/blood tests:  Food allergies, environmental also not as bad  51 y.o. Chronic cough for years and nodules just found.  11/15 At last ov we rec Cyclic cough protocol with tramadol/tessalon Stay on Qvar two puff twice daily, use spacer Add pepcid at bedtime Stay on pantoprazole one 1/2 hour before meals twice daily Follow reflux diet Full pulmonary function studies will be obtained Still awakens at night,  Still dyspneic esp after eating.  Cough is dry.  Still wheezes.  Now on two puff twice. Is using the spacer device.    No peak flow meter in use.  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 Cough had improved after vocal cord polyp removal and Advair use however the patient noted increased upper airway irritation with the Advair doses. The patient was maintained on proton pump inhibitors in the last office visit at the asthma center is documented April 2012 note pulmonary functions in September 2011 showed FEV1 to [facility] ratio of 55% FVC of 79% predicted FEV1 of 56% predicted FEF 25 7526% predicted normal DLCO mild restriction and 10% improvement in FEV1 after bronchodilator cough control in this patient at that time was being achieved with amitriptyline and Neurontin use.  Please  note the patient has been on Naldolol  40 mg daily for her liver disease and portal hypertension  We recommended: Prednisone in a tapering  dose Continued use of Qvar Two ventilations twice daily 80 mcg strength Add Foradil one capsule twice daily Avoid  Advair product Continue GERD treatment Nadolol use  is a concern but will maintain at this time due to liver disease Continue to use tramadol at night for cough control Does not some better,  While on inhaler , mouth bad with thrush.   Does use tramadol at night but still wakes up at 4am and will start coughing.   Cough is still dry.  Overall does feel better and energetic.  Achilles Dunk  Fax [phone] No real wheeze.  PFR 110-150 at first, now 250-300, worse as day wears on   1/3 At last ov we stopped naldolol. Pt has been doing better pulm wise. Now: Cheated on diet and now is hoarse.   Now off naldolol and this has helped the cough. Now if pushes self up and down steps and not coughing as much.  No wheeze. No real chest pain.   No edema in feet. No bleeding Mouth is better, no thrush. Past Medical History  Diagnosis Date  . Anemia   . Asthma   . Allergy   . Hepatic fibrosis      Family History  Problem Relation Age of Onset  . Arthritis Mother   . Heart disease Father     Triple bypass  . Heart attack Paternal Grandmother   . Stroke Paternal Grandfather   . Liver disease Sister     Juvenile cysitc liver disease  . Asthma  Sister      History   Social History  . Marital Status: Married    Spouse Name: N/A    Number of Children: N/A  . Years of Education: N/A   Occupational History  . HR Manager    Social History Main Topics  . Smoking status: Never Smoker   . Smokeless tobacco: Never Used  . Alcohol Use: Yes     wine nightly  . Drug Use: No  . Sexually Active: Not on file   Other Topics Concern  . Not on file   Social History Narrative  . No narrative on file     Allergies  Allergen Reactions  . Aspirin Other (See Comments)    Causes Asthma  . Dairy Aid (Lactase)     Causes Asthma attacks  . Eggs Or Egg-Derived Products      Causes Asthma  . Chocolate     Asthma attack, hives  . Peanut-Containing Drug Products     Asthma, eye swelling, itchy eyes, can't catch her breath  . Wheat     Asthma, hives     Outpatient Prescriptions Prior to Visit  Medication Sig Dispense Refill  . albuterol (VENTOLIN HFA) 108 (90 BASE) MCG/ACT inhaler Inhale 2 puffs into the lungs every 6 (six) hours as needed.        . Alum & Mag Hydroxide-Simeth (MAGIC MOUTHWASH) SOLN Take 15 mLs by mouth 3 (three) times daily. Swish gargle expectorate  180 mL  4  . beclomethasone (QVAR) 80 MCG/ACT inhaler Inhale 2 puffs into the lungs 2 (two) times daily.  1 Inhaler  3  . benzonatate (TESSALON) 100 MG capsule Take per cough protocol 1-2 every 4 hours  90 capsule  4  . formoterol (FORADIL AEROLIZER) 12 MCG capsule for inhaler Place 1 capsule (12 mcg total) into inhaler and inhale 2 (two) times daily.  60 capsule  12  . furosemide (LASIX) 20 MG tablet Take 1 tablet by mouth daily.       . pantoprazole (PROTONIX) 40 MG tablet Take 1 tablet by mouth 2 (two) times daily.       Marland Kitchen Spacer/Aero-Holding Chambers (AEROCHAMBER MV) inhaler Use as instructed  1 each  0  . spironolactone (ALDACTONE) 25 MG tablet Take 2 tablets by mouth daily.       . traMADol (ULTRAM) 50 MG tablet 2 at night         Review of Systems  Constitutional: Negative.  Negative for fever, chills, diaphoresis, activity change, fatigue and unexpected weight change.  HENT: Negative for hearing loss, nosebleeds, congestion, facial swelling, mouth sores, neck pain, neck stiffness, dental problem, voice change, sinus pressure, tinnitus and ear discharge.   Eyes: Negative for photophobia, discharge, itching and visual disturbance.  Respiratory: Negative for apnea, choking, chest tightness and stridor.   Cardiovascular: Negative for palpitations and leg swelling.  Gastrointestinal: Negative for nausea, vomiting, abdominal pain, constipation, blood in stool and abdominal distention.    Genitourinary: Negative for dysuria, urgency, frequency, hematuria, flank pain, decreased urine volume and difficulty urinating.  Musculoskeletal: Positive for back pain. Negative for joint swelling, arthralgias and gait problem.  Skin: Negative for color change, pallor and rash.  Neurological: Negative for dizziness, tremors, seizures, syncope, speech difficulty, weakness, light-headedness and numbness.  Hematological: Negative for adenopathy. Bruises/bleeds easily.  Psychiatric/Behavioral: Positive for sleep disturbance. Negative for confusion and agitation. The patient is not nervous/anxious.        Objective:   Physical Exam  Filed Vitals:  10/03/11 1154  BP: 130/84  Pulse: 91  Temp: 98.2 F (36.8 C)  TempSrc: Oral  Height: 5' 3.5" (1.613 m)  Weight: 148 lb (67.132 kg)  SpO2: 98%    Gen: Pleasant, well-nourished, in no distress,  normal affect  ENT: No lesions,  mouth clear,  oropharynx clear, no postnasal drip  Neck: No JVD, no TMG, no carotid bruits  Lungs: No use of accessory muscles, no dullness to percussion, less prominent pseudowheeze   Cardiovascular: RRR, heart sounds normal, no murmur or gallops, no peripheral edema  Abdomen: soft and NT, no HSM,  BS normal  Musculoskeletal: No deformities, no cyanosis or clubbing  Neuro: alert, non focal  Skin: Warm, no lesions or rashes   CT chest 9/27 IMPRESSION:  1. There are small nonspecific pulmonary nodules identified in  both lungs. These measure up to 5 mm. If there is no known  malignancy I would suggest serial follow-up imaging of these  nodules to establish stability and but may indicate. A first  follow-up examination should be obtained in 3 months.  2. 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 Cough had improved after vocal cord polyp removal and Advair use however the patient noted increased upper airway irritation with the Advair doses. The patient was maintained on proton pump inhibitors in the last office visit at the asthma center is documented April 2012 note pulmonary functions in September 2011 showed FEV1 to [facility] ratio of 55% FVC of 79% predicted FEV1 of 56% predicted FEF 25 7526% predicted normal DLCO mild restriction and 10% improvement in FEV1 after bronchodilator cough control in this patient at that time was being achieved with amitriptyline and Neurontin use.  Please  note the patient has been on Naldolol  40 mg daily for her liver disease and portal hypertension  Now improved with foradil. PFR up to 300 Pt also much improved with getting off naldolol.  No recurrent GI bleeding or varix issues.   Plan No change in inhaled or maintenance medications. Return in  4 months     Updated Medication List Outpatient Encounter Prescriptions as of 10/03/2011  Medication Sig Dispense Refill  . albuterol (VENTOLIN HFA) 108 (90 BASE) MCG/ACT inhaler Inhale 2 puffs into the lungs every 6 (six) hours as needed.        . Alum & Mag Hydroxide-Simeth (MAGIC MOUTHWASH) SOLN Take 15 mLs by mouth 3 (three) times daily. Swish gargle expectorate  180 mL  4  . beclomethasone (QVAR) 80 MCG/ACT inhaler Inhale 2 puffs into the lungs 2 (two) times daily.  1 Inhaler  3  . benzonatate (TESSALON) 100 MG capsule Take per cough protocol 1-2 every 4 hours  90 capsule  4  . formoterol (FORADIL AEROLIZER) 12 MCG capsule for inhaler Place 1 capsule (12 mcg total) into inhaler and inhale 2 (two) times daily.  60 capsule  12  . furosemide (LASIX) 20 MG tablet Take 1 tablet by mouth daily.       . pantoprazole  (PROTONIX) 40 MG tablet Take 1 tablet by mouth 2 (two) times daily.       Marland Kitchen  Spacer/Aero-Holding Chambers (AEROCHAMBER MV) inhaler Use as instructed  1 each  0  . spironolactone (ALDACTONE) 25 MG tablet Take 2 tablets by mouth daily.       . traMADol (ULTRAM) 50 MG tablet 2 at night

## 2011-11-28 ENCOUNTER — Encounter: Payer: Self-pay | Admitting: Critical Care Medicine

## 2011-11-28 ENCOUNTER — Ambulatory Visit (HOSPITAL_BASED_OUTPATIENT_CLINIC_OR_DEPARTMENT_OTHER)
Admission: RE | Admit: 2011-11-28 | Discharge: 2011-11-28 | Disposition: A | Discharge status: Home / Self Care | Payer: BC Managed Care – PPO | Source: Ambulatory Visit | Attending: Critical Care Medicine | Admitting: Critical Care Medicine

## 2011-11-28 ENCOUNTER — Ambulatory Visit (INDEPENDENT_AMBULATORY_CARE_PROVIDER_SITE_OTHER): Payer: BC Managed Care – PPO | Admitting: Critical Care Medicine

## 2011-11-28 DIAGNOSIS — R079 Chest pain, unspecified: Secondary | ICD-10-CM | POA: Insufficient documentation

## 2011-11-28 DIAGNOSIS — R911 Solitary pulmonary nodule: Secondary | ICD-10-CM

## 2011-11-28 DIAGNOSIS — J45909 Unspecified asthma, uncomplicated: Secondary | ICD-10-CM | POA: Insufficient documentation

## 2011-11-28 MED ORDER — RANITIDINE HCL 300 MG PO TABS: 300.0000 mg | ORAL_TABLET | Freq: Every day | ORAL | Status: DC

## 2011-11-28 NOTE — Progress Notes (Signed)
Subjective:    Patient ID: Alicia [name], female    DOB: 1961-07-14, 51 y.o.   MRN: [id number]  HPI Comments: Asthma dx for 66yrs.   HAs seen Dr Viann Shove ENT at [facility] for vocal cord nodules/polyps Skin tests/blood tests:  Food allergies, environmental also not as bad  51 y.o. Chronic cough for years and nodules just found.  11/15 At last ov we rec Cyclic cough protocol with tramadol/tessalon Stay on Qvar two puff twice daily, use spacer Add pepcid at bedtime Stay on pantoprazole one 1/2 hour before meals twice daily Follow reflux diet Full pulmonary function studies will be obtained Still awakens at night,  Still dyspneic esp after eating.  Cough is dry.  Still wheezes.  Now on two puff twice. Is using the spacer device.    No peak flow meter in use.  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 Cough had improved after vocal cord polyp removal and Advair use however the patient noted increased upper airway irritation with the Advair doses. The patient was maintained on proton pump inhibitors in the last office visit at the asthma center is documented April 2012 note pulmonary functions in September 2011 showed FEV1 to [facility] ratio of 55% FVC of 79% predicted FEV1 of 56% predicted FEF 25 7526% predicted normal DLCO mild restriction and 10% improvement in FEV1 after bronchodilator cough control in this patient at that time was being achieved with amitriptyline and Neurontin use.  Please  note the patient has been on Naldolol  40 mg daily for her liver disease and portal hypertension  We recommended: Prednisone in a tapering  dose Continued use of Qvar Two ventilations twice daily 80 mcg strength Add Foradil one capsule twice daily Avoid  Advair product Continue GERD treatment Nadolol use  is a concern but will maintain at this time due to liver disease Continue to use tramadol at night for cough control Does not some better,  While on inhaler , mouth bad with thrush.   Does use tramadol at night but still wakes up at 4am and will start coughing.   Cough is still dry.  Overall does feel better and energetic.  Achilles Dunk  Fax [phone] No real wheeze.  PFR 110-150 at first, now 250-300, worse as day wears on   1/3 At last ov we stopped naldolol. Pt has been doing better pulm wise. Now: Cheated on diet and now is hoarse.   Now off naldolol and this has helped the cough. Now if pushes self up and down steps and not coughing as much.  No wheeze. No real chest pain.   No edema in feet. No bleeding Mouth is better, no thrush.  2/28  Waves of pain, worse for two weeks.  No cough.  Notes some sinus drainage.  Cough better since off nadolol.  Notes nausea with chest pain.  Had emesis but not blood in emesis.   No real heart burn symptoms.  No belching or burping.  No wheeze.  Notes dyspnea with exertion. Has been compliant with diet.   Past Medical History  Diagnosis Date  . Anemia   . Asthma   .  Allergy   . Hepatic fibrosis      Family History  Problem Relation Age of Onset  . Arthritis Mother   . Heart disease Father     Triple bypass  . Heart attack Paternal Grandmother   . Stroke Paternal Grandfather   . Liver disease Sister     Juvenile cysitc liver disease  . Asthma Sister      History   Social History  . Marital Status: Married    Spouse Name: N/A    Number of Children: N/A  . Years of Education: N/A   Occupational History  . HR Manager    Social History Main Topics  . Smoking status: Never Smoker   . Smokeless tobacco: Never Used  . Alcohol Use: Yes     wine nightly  . Drug  Use: No  . Sexually Active: Not on file   Other Topics Concern  . Not on file   Social History Narrative  . No narrative on file     Allergies  Allergen Reactions  . Aspirin Other (See Comments)    Causes Asthma  . Dairy Aid (Lactase)     Causes Asthma attacks  . Eggs Or Egg-Derived Products     Causes Asthma  . Chocolate     Asthma attack, hives  . Peanut-Containing Drug Products     Asthma, eye swelling, itchy eyes, can't catch her breath  . Wheat     Asthma, hives     Outpatient Prescriptions Prior to Visit  Medication Sig Dispense Refill  . albuterol (VENTOLIN HFA) 108 (90 BASE) MCG/ACT inhaler Inhale 2 puffs into the lungs every 6 (six) hours as needed.        . beclomethasone (QVAR) 80 MCG/ACT inhaler Inhale 2 puffs into the lungs 2 (two) times daily.  1 Inhaler  3  . benzonatate (TESSALON) 100 MG capsule Take per cough protocol 1-2 every 4 hours  90 capsule  4  . formoterol (FORADIL AEROLIZER) 12 MCG capsule for inhaler Place 1 capsule (12 mcg total) into inhaler and inhale 2 (two) times daily.  60 capsule  12  . furosemide (LASIX) 20 MG tablet Take 1 tablet by mouth daily.       . pantoprazole (PROTONIX) 40 MG tablet Take 1 tablet by mouth 2 (two) times daily.       Marland Kitchen Spacer/Aero-Holding Chambers (AEROCHAMBER MV) inhaler Use as instructed  1 each  0  . spironolactone (ALDACTONE) 25 MG tablet Take 2 tablets by mouth daily.       . Alum & Mag Hydroxide-Simeth (MAGIC MOUTHWASH) SOLN Take 15 mLs by mouth 3 (three) times daily. Swish gargle expectorate  180 mL  4  . traMADol (ULTRAM) 50 MG tablet 2 at night         Review of Systems  Constitutional: Negative.  Negative for fever, chills, diaphoresis, activity change, fatigue and unexpected weight change.  HENT: Negative for hearing loss, nosebleeds, congestion, facial swelling, mouth sores, neck pain, neck stiffness, dental problem, voice change, sinus pressure, tinnitus and ear discharge.   Eyes: Negative for  photophobia, discharge, itching and visual disturbance.  Respiratory: Negative for apnea, choking, chest tightness and stridor.   Cardiovascular: Negative for palpitations and leg swelling.  Gastrointestinal: Negative for nausea, vomiting, abdominal pain, constipation, blood in stool and abdominal distention.  Genitourinary: Negative for dysuria, urgency, frequency, hematuria, flank pain, decreased urine volume and difficulty urinating.  Musculoskeletal: Positive for back pain. Negative for joint swelling, arthralgias and  gait problem.  Skin: Negative for color change, pallor and rash.  Neurological: Negative for dizziness, tremors, seizures, syncope, speech difficulty, weakness, light-headedness and numbness.  Hematological: Negative for adenopathy. Bruises/bleeds easily.  Psychiatric/Behavioral: Positive for sleep disturbance. Negative for confusion and agitation. The patient is not nervous/anxious.        Objective:   Physical Exam  Filed Vitals:   11/28/11 1019  BP: 118/76  Pulse: 85  Temp: 98.1 F (36.7 C)  TempSrc: Oral  Height: 5\' 3"  (1.6 m)  Weight: 140 lb (63.504 kg)  SpO2: 98%    Gen: Pleasant, well-nourished, in no distress,  normal affect  ENT: No lesions,  mouth clear,  oropharynx clear, no postnasal drip  Neck: No JVD, no TMG, no carotid bruits  Lungs: No use of accessory muscles, no dullness to percussion, less prominent pseudowheeze   Cardiovascular: RRR, heart sounds normal, no murmur or gallops, no peripheral edema  Abdomen: tender in epigastric area, no HSM,  BS normal  Musculoskeletal: No deformities, no cyanosis or clubbing  Neuro: alert, non focal  Skin: Warm, no lesions or rashes   Dg Chest 2 View  11/28/2011  *RADIOLOGY REPORT*  Clinical Data: Chest pain  CHEST - 2 VIEW  Comparison: 06/27/2011  Findings: Cardiomediastinal silhouette is stable.  No acute infiltrate or pleural effusion.  No pulmonary edema.  Stable small nodule in the right upper  lobe.  Bony thorax is unremarkable.  IMPRESSION: No active disease.  No significant change.  Stable small nodule in the right upper lobe.  Original Report Authenticated By: Natasha Mead, M.D.       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 albuterol (VENTOLIN HFA) 108 (90 BASE) MCG/ACT inhaler Inhale 2 puffs into the lungs every 6 (six) hours as needed.        . beclomethasone (QVAR) 80 MCG/ACT inhaler Inhale 2 puffs into the lungs 2 (two) times daily.  1 Inhaler  3  . benzonatate (TESSALON) 100 MG capsule Take per cough protocol 1-2 every 4 hours  90 capsule  4  . formoterol (FORADIL AEROLIZER) 12 MCG capsule for inhaler Place 1 capsule (12 mcg total) into inhaler and inhale 2 (two) times daily.  60 capsule  12  . furosemide (LASIX) 20 MG tablet Take 1 tablet by mouth daily.       . pantoprazole (PROTONIX) 40 MG tablet Take 1 tablet by mouth 2 (two) times daily.       Marland Kitchen Spacer/Aero-Holding Chambers (AEROCHAMBER MV) inhaler Use as instructed  1 each  0  . spironolactone (ALDACTONE) 25 MG tablet Take 2 tablets by mouth daily.       . ranitidine (ZANTAC) 300 MG tablet Take 1 tablet (300 mg total) by mouth at bedtime.  30 tablet  6  . DISCONTD: Alum & Mag Hydroxide-Simeth (MAGIC MOUTHWASH) SOLN Take 15 mLs by mouth 3 (three) times daily. Swish gargle expectorate  180 mL  4  . DISCONTD: traMADol (ULTRAM) 50 MG tablet 2 at night

## 2011-11-28 NOTE — Progress Notes (Signed)
Quick Note:  Notify the patient that the Xray is stable and no pneumonia No change in medications are recommended. Continue current meds as prescribed at last office visit ______ 

## 2011-11-28 NOTE — Patient Instructions (Signed)
Follow reflux diet STRICT Add ranitidine 300mg  at bedtime , Rx [facility] Use protonix twice daily, 1/2 hour before each meal Chest xray today ECG was normal See your GI MD soon Return 4 months

## 2011-11-29 NOTE — Assessment & Plan Note (Signed)
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

## 2011-11-29 NOTE — Progress Notes (Signed)
Quick Note:  I called, spoke with pt. I informed her of xray results and recs per Dr. Delford Field. She verbalized understanding of this and voiced no further questions/cocnerns at this time. ______

## 2012-01-06 ENCOUNTER — Telehealth: Payer: Self-pay | Admitting: Critical Care Medicine

## 2012-01-06 DIAGNOSIS — R918 Other nonspecific abnormal finding of lung field: Secondary | ICD-10-CM

## 2012-01-06 NOTE — Telephone Encounter (Signed)
This pt needs a non contrasted CT of chest scheduled to f/u lung nodules

## 2012-01-06 NOTE — Telephone Encounter (Signed)
Message copied by Storm Frisk on Mon Jan 06, 2012  9:23 AM ------      Message from: Storm Frisk      Created: Fri Jul 05, 2011  4:31 PM       Repeat CT Chst

## 2012-01-06 NOTE — Telephone Encounter (Signed)
Order placed for PCC's to schedule.  Alicia [name] is aware.

## 2012-01-15 ENCOUNTER — Ambulatory Visit (HOSPITAL_BASED_OUTPATIENT_CLINIC_OR_DEPARTMENT_OTHER)
Admission: RE | Admit: 2012-01-15 | Discharge: 2012-01-15 | Disposition: A | Discharge status: Home / Self Care | Payer: BC Managed Care – PPO | Source: Ambulatory Visit | Attending: Critical Care Medicine | Admitting: Critical Care Medicine

## 2012-01-15 DIAGNOSIS — J984 Other disorders of lung: Secondary | ICD-10-CM | POA: Insufficient documentation

## 2012-01-15 DIAGNOSIS — R079 Chest pain, unspecified: Secondary | ICD-10-CM | POA: Insufficient documentation

## 2012-01-15 DIAGNOSIS — K7689 Other specified diseases of liver: Secondary | ICD-10-CM

## 2012-01-15 DIAGNOSIS — I8500 Esophageal varices without bleeding: Secondary | ICD-10-CM

## 2012-01-15 DIAGNOSIS — R918 Other nonspecific abnormal finding of lung field: Secondary | ICD-10-CM

## 2012-01-15 DIAGNOSIS — R911 Solitary pulmonary nodule: Secondary | ICD-10-CM

## 2012-02-10 ENCOUNTER — Other Ambulatory Visit: Payer: Self-pay | Admitting: Critical Care Medicine

## 2012-05-21 ENCOUNTER — Other Ambulatory Visit: Payer: Self-pay | Admitting: Critical Care Medicine

## 2012-07-02 ENCOUNTER — Encounter: Payer: Self-pay | Admitting: Critical Care Medicine

## 2012-07-02 ENCOUNTER — Ambulatory Visit (INDEPENDENT_AMBULATORY_CARE_PROVIDER_SITE_OTHER): Payer: BC Managed Care – PPO | Admitting: Critical Care Medicine

## 2012-07-02 VITALS — BP 110/78 | HR 77 | Temp 98.7°F | Ht 63.0 in | Wt 130.0 lb

## 2012-07-02 DIAGNOSIS — J45909 Unspecified asthma, uncomplicated: Secondary | ICD-10-CM

## 2012-07-02 MED ORDER — CETIRIZINE HCL 10 MG PO CHEW: 10.0000 mg | CHEWABLE_TABLET | Freq: Every day | ORAL | Status: DC

## 2012-07-02 NOTE — Patient Instructions (Addendum)
Stay on foradil and Qvar Stay on zantac and protonix Try over the counter zyrtec 10mg  daily until second hard frost or allergra 180mg  daily, these are over the counter Return 4 months

## 2012-07-02 NOTE — Progress Notes (Signed)
Subjective:    Patient ID: Alicia [name], female    DOB: [birth year], 51 y.o.   MRN: [id number]  HPI Comments: Asthma dx for 61yrs.   HAs seen Dr Viann Shove ENT at [facility] for vocal cord nodules/polyps Skin tests/blood tests:  Food allergies, environmental also not as bad  51 y.o. Chronic cough for years and nodules just found.  11/15 At last ov we rec Cyclic cough protocol with tramadol/tessalon Stay on Qvar two puff twice daily, use spacer Add pepcid at bedtime Stay on pantoprazole one 1/2 hour before meals twice daily Follow reflux diet Full pulmonary function studies will be obtained Still awakens at night,  Still dyspneic esp after eating.  Cough is dry.  Still wheezes.  Now on two puff twice. Is using the spacer device.    No peak flow meter in use.  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 Cough had improved after vocal cord polyp removal and Advair use however the patient noted increased upper airway irritation with the Advair doses. The patient was maintained on proton pump inhibitors in the last office visit at the asthma center is documented April 2012 note pulmonary functions in September 2011 showed FEV1 to [facility] ratio of 55% FVC of 79% predicted FEV1 of 56% predicted FEF 25 7526% predicted normal DLCO mild restriction and 10% improvement in FEV1 after bronchodilator cough control in this patient at that time was being achieved with amitriptyline and Neurontin use.  Please  note the patient has been on Naldolol  40 mg daily for her liver disease and portal hypertension  We recommended: Prednisone in a tapering  dose Continued use of Qvar Two ventilations twice daily 80 mcg strength Add Foradil one capsule twice daily Avoid  Advair product Continue GERD treatment Nadolol use  is a concern but will maintain at this time due to liver disease Continue to use tramadol at night for cough control Does not some better,  While on inhaler , mouth bad with thrush.   Does use tramadol at night but still wakes up at 4am and will start coughing.   Cough is still dry.  Overall does feel better and energetic.  Achilles Dunk  Fax [phone] No real wheeze.  PFR 110-150 at first, now 250-300, worse as day wears on   1/3 At last ov we stopped naldolol. Pt has been doing better pulm wise. Now: Cheated on diet and now is hoarse.   Now off naldolol and this has helped the cough. Now if pushes self up and down steps and not coughing as much.  No wheeze. No real chest pain.   No edema in feet. No bleeding Mouth is better, no thrush.  2/28  Waves of pain, worse for two weeks.  No cough.  Notes some sinus drainage.  Cough better since off nadolol.  Notes nausea with chest pain.  Had emesis but not blood in emesis.   No real heart burn symptoms.  No belching or burping.  No wheeze.  Notes dyspnea with exertion. Has been compliant with diet.   07/02/2012 Pt had no real new issues.  Pt is better with ranitidine QHS.  Testing at [facility].  Eating more bland and more frequent meals.  This has helped.  Cough is better.  Pt notes more recently, more pndrip. Bloody mucus out of the nose.  Pt awakens at night with coughing and pndrip. Foradil/Qvar helps but challenges on [employer] copay.    Past Medical History  Diagnosis Date  . Anemia   . Asthma   . Allergy   . Hepatic fibrosis      Family History  Problem Relation Age of Onset  . Arthritis Mother   . Heart disease Father     Triple bypass  . Heart attack Paternal Grandmother   . Stroke Paternal Grandfather   . Liver disease Sister     Juvenile cysitc liver disease    . Asthma Sister      History   Social History  . Marital Status: Married    Spouse Name: N/A    Number of Children: N/A  . Years of Education: N/A   Occupational History  . HR Manager    Social History Main Topics  . Smoking status: Never Smoker   . Smokeless tobacco: Never Used  . Alcohol Use: Yes     wine nightly  . Drug Use: No  . Sexually Active: Not on file   Other Topics Concern  . Not on file   Social History Narrative  . No narrative on file     Allergies  Allergen Reactions  . Aspirin Other (See Comments)    Causes Asthma  . Dairy Aid (Lactase)     Causes Asthma attacks  . Eggs Or Egg-Derived Products     Causes Asthma  . Chocolate     Asthma attack, hives  . Peanut-Containing Drug Products     Asthma, eye swelling, itchy eyes, can't catch her breath  . Wheat     Asthma, hives     Outpatient Prescriptions Prior to Visit  Medication Sig Dispense Refill  . albuterol (VENTOLIN HFA) 108 (90 BASE) MCG/ACT inhaler Inhale 2 puffs into the lungs every 6 (six) hours as needed.        . benzonatate (TESSALON) 100 MG capsule TAKE 1-2 CAPSULES EVERY 4 HOURS FOR COUGH  90 capsule  3  . formoterol (FORADIL AEROLIZER) 12 MCG capsule for inhaler Place 1 capsule (12 mcg total) into inhaler and inhale 2 (two) times daily.  60 capsule  12  . furosemide (LASIX) 20 MG tablet Take 1 tablet by mouth daily.       . pantoprazole (PROTONIX) 40 MG tablet Take 1 tablet by mouth 2 (two) times daily.       Marland Kitchen QVAR 80 MCG/ACT inhaler INHALE 2 PUFFS INTO THE LUNGS 2 (TWO) TIMES DAILY.  8.7 g  6  . ranitidine (ZANTAC) 300 MG tablet Take 1 tablet (300 mg total) by mouth at bedtime.  30 tablet  6  . Spacer/Aero-Holding Chambers (AEROCHAMBER MV) inhaler Use as instructed  1 each  0  . spironolactone (ALDACTONE) 25 MG tablet Take 2 tablets by mouth daily.          Review of Systems  Constitutional: Negative.  Negative for fever, chills, diaphoresis, activity change, fatigue and  unexpected weight change.  HENT: Negative for hearing loss, nosebleeds, congestion, facial swelling, mouth sores, neck pain, neck stiffness, dental problem, voice change, sinus pressure, tinnitus and ear discharge.   Eyes: Negative for photophobia, discharge, itching and visual disturbance.  Respiratory: Negative for apnea, choking, chest tightness and stridor.   Cardiovascular:  Negative for palpitations and leg swelling.  Gastrointestinal: Negative for nausea, vomiting, abdominal pain, constipation, blood in stool and abdominal distention.  Genitourinary: Negative for dysuria, urgency, frequency, hematuria, flank pain, decreased urine volume and difficulty urinating.  Musculoskeletal: Positive for back pain. Negative for joint swelling, arthralgias and gait problem.  Skin: Negative for color change, pallor and rash.  Neurological: Negative for dizziness, tremors, seizures, syncope, speech difficulty, weakness, light-headedness and numbness.  Hematological: Negative for adenopathy. Bruises/bleeds easily.  Psychiatric/Behavioral: Positive for disturbed wake/sleep cycle. Negative for confusion and agitation. The patient is not nervous/anxious.        Objective:   Physical Exam  Filed Vitals:   07/02/12 1049  BP: 110/78  Pulse: 77  Temp: 98.7 F (37.1 C)  TempSrc: Oral  Height: 5\' 3"  (1.6 m)  Weight: 58.968 kg (130 lb)  SpO2: 98%    Gen: Pleasant, well-nourished, in no distress,  normal affect  ENT: No lesions,  mouth clear,  oropharynx clear, +++  postnasal drip, moderate rhinitis without purulence  Neck: No JVD, no TMG, no carotid bruits  Lungs: No use of accessory muscles, no dullness to percussion, less prominent pseudowheeze   Cardiovascular: RRR, heart sounds normal, no murmur or gallops, no peripheral edema  Abdomen: tender in epigastric area, no HSM,  BS normal  Musculoskeletal: No deformities, no cyanosis or clubbing  Neuro: alert, non focal  Skin: Warm, no lesions  or rashes   No results found.     Assessment & Plan:   severe persistent asthma with upper airway instability Moderate persistent asthma with associated cough variation exacerbated by upper airway instability and associated reflux disease Ppt now by allergic rhinitis Plan Stay on foradil and Qvar Stay on zantac and protonix Try over the counter zyrtec 10mg  daily until second hard frost or allergra 180mg  daily, these are over the counter Return 4 months     Updated Medication List Outpatient Encounter Prescriptions as of 07/02/2012  Medication Sig Dispense Refill  . albuterol (VENTOLIN HFA) 108 (90 BASE) MCG/ACT inhaler Inhale 2 puffs into the lungs every 6 (six) hours as needed.        . benzonatate (TESSALON) 100 MG capsule TAKE 1-2 CAPSULES EVERY 4 HOURS FOR COUGH  90 capsule  3  . formoterol (FORADIL AEROLIZER) 12 MCG capsule for inhaler Place 1 capsule (12 mcg total) into inhaler and inhale 2 (two) times daily.  60 capsule  12  . furosemide (LASIX) 20 MG tablet Take 1 tablet by mouth daily.       . pantoprazole (PROTONIX) 40 MG tablet Take 1 tablet by mouth 2 (two) times daily.       Marland Kitchen QVAR 80 MCG/ACT inhaler INHALE 2 PUFFS INTO THE LUNGS 2 (TWO) TIMES DAILY.  8.7 g  6  . ranitidine (ZANTAC) 300 MG tablet Take 1 tablet (300 mg total) by mouth at bedtime.  30 tablet  6  . Spacer/Aero-Holding Chambers (AEROCHAMBER MV) inhaler Use as instructed  1 each  0  . spironolactone (ALDACTONE) 25 MG tablet Take 2 tablets by mouth daily.       . cetirizine (ZYRTEC) 10 MG chewable tablet Chew 1 tablet (10 mg total) by mouth daily.  30 tablet  2

## 2012-07-03 NOTE — Assessment & Plan Note (Addendum)
Moderate persistent asthma with associated cough variation exacerbated by upper airway instability and associated reflux disease Ppt now by allergic rhinitis Plan Stay on foradil and Qvar Stay on zantac and protonix Try over the counter zyrtec 10mg  daily until second hard frost or allergra 180mg  daily, these are over the counter Return 4 months

## 2012-08-25 ENCOUNTER — Telehealth: Payer: Self-pay | Admitting: Critical Care Medicine

## 2012-08-25 ENCOUNTER — Other Ambulatory Visit: Payer: Self-pay | Admitting: Critical Care Medicine

## 2012-08-25 MED ORDER — FORMOTEROL FUMARATE 12 MCG IN CAPS: 12.0000 ug | ORAL_CAPSULE | Freq: Two times a day (BID) | RESPIRATORY_TRACT | Status: DC

## 2012-08-25 NOTE — Telephone Encounter (Signed)
formoterol (FORADIL AEROLIZER) 12 MCG Place 1 capsule (12 mcg total) into inhaler and inhale 2 (two) times daily. #30 x3rf Sent to [facility] today  ranitidine (ZANTAC) 300 MG tablet Take 1 tablet at bedtime. #30 x 6rf   Sent to [facility] today.  Called patient to return phone call--patient requesting these meds to be refilled. LMOMTCB x1

## 2012-08-26 NOTE — Telephone Encounter (Signed)
lmomtcb x2 on pt cell/home #

## 2012-08-28 NOTE — Telephone Encounter (Signed)
ATC pt and had to lmomtcb on cell and home # x 3- left detailed message on home named [facility] has been called and are ready for pick up . I called [facility] to see if pt has picked up the medications yet.  I was advised they are ready but have not been picked up yet. Will sign off message

## 2013-02-20 ENCOUNTER — Other Ambulatory Visit: Payer: Self-pay | Admitting: Critical Care Medicine

## 2013-04-05 ENCOUNTER — Other Ambulatory Visit: Payer: Self-pay | Admitting: Critical Care Medicine

## 2013-05-08 ENCOUNTER — Other Ambulatory Visit: Payer: Self-pay | Admitting: Critical Care Medicine

## 2013-06-24 ENCOUNTER — Telehealth: Payer: Self-pay | Admitting: *Deleted

## 2013-06-24 DIAGNOSIS — R918 Other nonspecific abnormal finding of lung field: Secondary | ICD-10-CM

## 2013-06-24 NOTE — Telephone Encounter (Signed)
Called, spoke with pt.  Reminded her of below and advised she will be receiving another call to have this scheduled. She verbalized understanding and voiced no further questions or concerns at this time.

## 2013-06-24 NOTE — Telephone Encounter (Signed)
Message copied by Valentino Hue on Thu Jun 24, 2013  9:02 AM ------      Message from: Storm Frisk      Created: Tue Jun 22, 2013  3:07 PM       Time for this pt to get a non contrasted CT Chest for nodule f/u      ----- Message -----         From: Storm Frisk, MD         Sent: 01/15/2012   4:57 PM           To: Storm Frisk, MD            Recheck CT chest        ------

## 2013-06-28 ENCOUNTER — Ambulatory Visit (HOSPITAL_BASED_OUTPATIENT_CLINIC_OR_DEPARTMENT_OTHER)
Admission: RE | Admit: 2013-06-28 | Discharge: 2013-06-28 | Disposition: A | Discharge status: Home / Self Care | Payer: BC Managed Care – PPO | Source: Ambulatory Visit | Attending: Critical Care Medicine | Admitting: Critical Care Medicine

## 2013-06-28 ENCOUNTER — Other Ambulatory Visit: Payer: BC Managed Care – PPO

## 2013-06-28 DIAGNOSIS — K7460 Unspecified cirrhosis of liver: Secondary | ICD-10-CM | POA: Insufficient documentation

## 2013-06-28 DIAGNOSIS — R918 Other nonspecific abnormal finding of lung field: Secondary | ICD-10-CM

## 2013-06-30 NOTE — Progress Notes (Signed)
Quick Note:  Call the pt and tell her CT Chest is stable. No change in nodules. These do not represent CA and she does not need further CT Scanning ______

## 2013-06-30 NOTE — Progress Notes (Signed)
Quick Note:  lmomtcb on pt's home and cell #s ______ 

## 2013-07-01 NOTE — Progress Notes (Signed)
Quick Note:  Called, spoke with pt. Informed her of CT Chest results and recs per Dr. Delford Field. She verbalized understanding of this. Pt is requesting to schedule a f/u with PW to discuss foradil as this makes her shaky and d/t waking up qhs.  We have scheduled this for Monday, Oct 6 at 3 pm in [place]. Pt aware and voiced no further questions or concerns at this time. ______

## 2013-07-03 ENCOUNTER — Other Ambulatory Visit: Payer: Self-pay | Admitting: Critical Care Medicine

## 2013-07-05 ENCOUNTER — Ambulatory Visit (INDEPENDENT_AMBULATORY_CARE_PROVIDER_SITE_OTHER): Payer: BC Managed Care – PPO | Admitting: Critical Care Medicine

## 2013-07-05 ENCOUNTER — Encounter: Payer: Self-pay | Admitting: Critical Care Medicine

## 2013-07-05 VITALS — BP 124/80 | HR 85 | Temp 98.6°F | Ht 63.5 in | Wt 135.0 lb

## 2013-07-05 DIAGNOSIS — J45909 Unspecified asthma, uncomplicated: Secondary | ICD-10-CM

## 2013-07-05 MED ORDER — MOMETASONE FURO-FORMOTEROL FUM 200-5 MCG/ACT IN AERO: 2.0000 | INHALATION_SPRAY | Freq: Two times a day (BID) | RESPIRATORY_TRACT | Status: DC

## 2013-07-05 NOTE — Assessment & Plan Note (Signed)
Severe persistent asthma with upper airway instability stable at this time Plan Because of adverse reactions to Foradil will switch Dulera 202 puff twice daily and discontinue Foradil and Qvar

## 2013-07-05 NOTE — Progress Notes (Signed)
Subjective:    Patient ID: Alicia [name], female    DOB: Oct 12, 1960, 52 y.o.   MRN: [id number]  HPI 52 y.o.WF  07/05/2013 Chief Complaint  Patient presents with  . Follow-up    Reports shakiness from foradil.  Also, waking up qhs coughing with clear mucus.  Sleeping on sleep # bed.  Has chest heaviness - worse when laying down and DOE.    This patient notes increased shakiness from Foradil and awakens at night with coughing as well. The patient did have a recent CT scan of the chest which showed no change in benign lung nodules. The patient states there is some chest tightness. There is no increased use in rescue inhaler. There is still ongoing reflux symptoms.     Past Medical History  Diagnosis Date  . Anemia   . Asthma   . Allergy   . Hepatic fibrosis      Family History  Problem Relation Age of Onset  . Arthritis Mother   . Heart disease Father     Triple bypass  . Heart attack Paternal Grandmother   . Stroke Paternal Grandfather   . Liver disease Sister     Juvenile cysitc liver disease  . Asthma Sister      History   Social History  . Marital Status: Married    Spouse Name: N/A    Number of Children: N/A  . Years of Education: N/A   Occupational History  . HR Manager    Social History Main Topics  . Smoking status: Never Smoker   . Smokeless tobacco: Never Used  . Alcohol Use: Yes     Comment: wine nightly  . Drug Use: No  . Sexual Activity: Not on file   Other Topics Concern  . Not on file   Social History Narrative  . No narrative on file     Allergies  Allergen Reactions  . Aspirin Other (See Comments)    Causes Asthma  . Dairy Aid [Lactase]     Causes Asthma attacks  . Eggs Or Egg-Derived Products     Causes Asthma  . Chocolate     Asthma attack, hives  . Peanut-Containing Drug Products     Asthma, eye swelling, itchy eyes, can't catch her breath  . Wheat     Asthma, hives     Outpatient Prescriptions Prior to Visit   Medication Sig Dispense Refill  . albuterol (VENTOLIN HFA) 108 (90 BASE) MCG/ACT inhaler Inhale 2 puffs into the lungs every 6 (six) hours as needed.        . benzonatate (TESSALON) 100 MG capsule TAKE 1-2 CAPSULES EVERY 4 HOURS FOR COUGH  90 capsule  2  . cetirizine (ZYRTEC) 10 MG chewable tablet Chew 1 tablet (10 mg total) by mouth daily.  30 tablet  2  . furosemide (LASIX) 20 MG tablet Take 1 tablet by mouth daily.       . pantoprazole (PROTONIX) 40 MG tablet Take 1 tablet by mouth 2 (two) times daily.       . ranitidine (ZANTAC) 300 MG tablet TAKE 1 TABLET (300 MG TOTAL) BY MOUTH AT BEDTIME.  30 tablet  6  . spironolactone (ALDACTONE) 25 MG tablet Take 2 tablets by mouth daily.       Marland Kitchen FORADIL AEROLIZER 12 MCG capsule for inhaler PLACE 1 CAPSULE (12 MCG TOTAL) INTO INHALER AND INHALE 2 (TWO) TIMES DAILY.  60 capsule  1  . QVAR 80 MCG/ACT inhaler INHALE 2 PUFFS  INTO THE LUNGS 2 (TWO) TIMES DAILY.  8.7 g  2  . ranitidine (ZANTAC) 300 MG tablet TAKE 1 TABLET (300 MG TOTAL) BY MOUTH AT BEDTIME.  30 tablet  6   No facility-administered medications prior to visit.     Review of Systems  Constitutional: Negative.  Negative for fever, chills, diaphoresis, activity change, fatigue and unexpected weight change.  HENT: Negative for hearing loss, nosebleeds, congestion, facial swelling, mouth sores, neck pain, neck stiffness, dental problem, voice change, sinus pressure, tinnitus and ear discharge.   Eyes: Negative for photophobia, discharge, itching and visual disturbance.  Respiratory: Negative for apnea, choking, chest tightness and stridor.   Cardiovascular: Negative for palpitations and leg swelling.  Gastrointestinal: Negative for nausea, vomiting, abdominal pain, constipation, blood in stool and abdominal distention.  Genitourinary: Negative for dysuria, urgency, frequency, hematuria, flank pain, decreased urine volume and difficulty urinating.  Musculoskeletal: Positive for back pain.  Negative for joint swelling, arthralgias and gait problem.  Skin: Negative for color change, pallor and rash.  Neurological: Negative for dizziness, tremors, seizures, syncope, speech difficulty, weakness, light-headedness and numbness.  Hematological: Negative for adenopathy. Bruises/bleeds easily.  Psychiatric/Behavioral: Positive for sleep disturbance. Negative for confusion and agitation. The patient is not nervous/anxious.        Objective:   Physical Exam  Filed Vitals:   07/05/13 1510  BP: 124/80  Pulse: 85  Temp: 98.6 F (37 C)  TempSrc: Oral  Height: 5' 3.5" (1.613 m)  Weight: 135 lb (61.236 kg)  SpO2: 96%    Gen: Pleasant, well-nourished, in no distress,  normal affect  ENT: No lesions,  mouth clear,  oropharynx clear, +++  postnasal drip, moderate rhinitis without purulence  Neck: No JVD, no TMG, no carotid bruits  Lungs: No use of accessory muscles, no dullness to percussion, less prominent pseudowheeze   Cardiovascular: RRR, heart sounds normal, no murmur or gallops, no peripheral edema  Abdomen: tender in epigastric area, no HSM,  BS normal  Musculoskeletal: No deformities, no cyanosis or clubbing  Neuro: alert, non focal  Skin: Warm, no lesions or rashes     Assessment & Plan:   severe persistent asthma with upper airway instability Severe persistent asthma with upper airway instability stable at this time Plan Because of adverse reactions to Foradil will switch Dulera 202 puff twice daily and discontinue Foradil and Qvar     Updated Medication List Outpatient Encounter Prescriptions as of 07/05/2013  Medication Sig Dispense Refill  . albuterol (VENTOLIN HFA) 108 (90 BASE) MCG/ACT inhaler Inhale 2 puffs into the lungs every 6 (six) hours as needed.        . benzonatate (TESSALON) 100 MG capsule TAKE 1-2 CAPSULES EVERY 4 HOURS FOR COUGH  90 capsule  2  . cetirizine (ZYRTEC) 10 MG chewable tablet Chew 1 tablet (10 mg total) by mouth daily.  30  tablet  2  . furosemide (LASIX) 20 MG tablet Take 1 tablet by mouth daily.       . pantoprazole (PROTONIX) 40 MG tablet Take 1 tablet by mouth 2 (two) times daily.       . ranitidine (ZANTAC) 300 MG tablet TAKE 1 TABLET (300 MG TOTAL) BY MOUTH AT BEDTIME.  30 tablet  6  . spironolactone (ALDACTONE) 25 MG tablet Take 2 tablets by mouth daily.       . [DISCONTINUED] FORADIL AEROLIZER 12 MCG capsule for inhaler PLACE 1 CAPSULE (12 MCG TOTAL) INTO INHALER AND INHALE 2 (TWO) TIMES DAILY.  60  capsule  1  . [DISCONTINUED] QVAR 80 MCG/ACT inhaler INHALE 2 PUFFS INTO THE LUNGS 2 (TWO) TIMES DAILY.  8.7 g  2  . mometasone-formoterol (DULERA) 200-5 MCG/ACT AERO Inhale 2 puffs into the lungs 2 (two) times daily.  1 Inhaler  6  . [DISCONTINUED] ranitidine (ZANTAC) 300 MG tablet TAKE 1 TABLET (300 MG TOTAL) BY MOUTH AT BEDTIME.  30 tablet  6   No facility-administered encounter medications on file as of 07/05/2013.

## 2013-07-05 NOTE — Patient Instructions (Addendum)
Start Dulera two puff twice daily Stop Qvar  Stop Foradil Return 4 months

## 2013-07-26 ENCOUNTER — Telehealth: Payer: Self-pay | Admitting: Critical Care Medicine

## 2013-07-26 MED ORDER — MOMETASONE FURO-FORMOTEROL FUM 200-5 MCG/ACT IN AERO: 2.0000 | INHALATION_SPRAY | Freq: Two times a day (BID) | RESPIRATORY_TRACT | Status: DC

## 2013-07-26 NOTE — Telephone Encounter (Signed)
Rx has been sent in. Pt is aware. 

## 2013-08-19 ENCOUNTER — Other Ambulatory Visit: Payer: Self-pay | Admitting: Critical Care Medicine

## 2013-09-30 LAB — HM PAP SMEAR: HM Pap smear: NORMAL

## 2013-12-28 ENCOUNTER — Other Ambulatory Visit: Payer: Self-pay | Admitting: Critical Care Medicine

## 2014-03-29 ENCOUNTER — Other Ambulatory Visit: Payer: Self-pay | Admitting: Critical Care Medicine

## 2014-04-21 ENCOUNTER — Other Ambulatory Visit: Payer: Self-pay | Admitting: Critical Care Medicine

## 2014-05-02 ENCOUNTER — Other Ambulatory Visit: Payer: Self-pay | Admitting: Critical Care Medicine

## 2014-06-20 ENCOUNTER — Other Ambulatory Visit: Payer: Self-pay | Admitting: Critical Care Medicine

## 2014-08-26 ENCOUNTER — Other Ambulatory Visit: Payer: Self-pay | Admitting: Critical Care Medicine

## 2014-09-08 ENCOUNTER — Encounter: Payer: Self-pay | Admitting: Critical Care Medicine

## 2014-09-08 ENCOUNTER — Ambulatory Visit (INDEPENDENT_AMBULATORY_CARE_PROVIDER_SITE_OTHER): Payer: BC Managed Care – PPO | Admitting: Critical Care Medicine

## 2014-09-08 VITALS — BP 126/72 | HR 92 | Temp 98.2°F | Ht 63.5 in | Wt 138.0 lb

## 2014-09-08 DIAGNOSIS — Z23 Encounter for immunization: Secondary | ICD-10-CM

## 2014-09-08 DIAGNOSIS — J4551 Severe persistent asthma with (acute) exacerbation: Secondary | ICD-10-CM

## 2014-09-08 MED ORDER — AEROCHAMBER MV MISC: Status: DC

## 2014-09-08 NOTE — Progress Notes (Signed)
Subjective:    Patient ID: Alicia [name], female    DOB: [birth year], 53 y.o.   MRN: [id number]  HPI 53 y.o.WF  09/08/2014  Chief Complaint  Patient presents with  . Follow-up    Pt c/o still getting over sinus infection being treated by another provider.  Pt c/o that her dulera makes her shake for several hours after use.    Just now got over sinus infection : Abx and pred rx (second round doxy>>levoflox  ).  Pt had fever.  No real fever now.  Notes some cough  Notes some dyspnea but is better.  Labs 08/2014: K 4.1  On high dose aldactone and potassium. Pt denies any significant sore throat, nasal congestion or excess secretions, fever, chills, sweats, unintended weight loss, pleurtic or exertional chest pain, orthopnea PND, or leg swelling Pt denies any increase in rescue therapy over baseline, denies waking up needing it or having any early am or nocturnal exacerbations of coughing/wheezing/or dyspnea. Pt also denies any obvious fluctuation in symptoms with  weather or environmental change or other alleviating or aggravating factors      Review of Systems  Constitutional: Positive for fever. Negative for chills, diaphoresis, activity change, fatigue and unexpected weight change.  HENT: Positive for postnasal drip, sinus pressure, sore throat and voice change. Negative for congestion, dental problem, ear discharge, facial swelling, hearing loss, mouth sores, nosebleeds and tinnitus.   Eyes: Negative for photophobia, discharge, itching and visual disturbance.  Respiratory: Positive for cough, shortness of breath and wheezing. Negative for apnea, choking, chest tightness and stridor.   Cardiovascular: Negative for palpitations and leg swelling.  Gastrointestinal: Negative for nausea, vomiting, abdominal pain, constipation, blood in stool and abdominal distention.  Genitourinary: Negative for dysuria, urgency, frequency, hematuria, flank pain, decreased urine volume and difficulty  urinating.  Musculoskeletal: Positive for back pain. Negative for joint swelling, arthralgias, gait problem, neck pain and neck stiffness.  Skin: Negative for color change, pallor and rash.  Neurological: Negative for dizziness, tremors, seizures, syncope, speech difficulty, weakness, light-headedness and numbness.  Hematological: Negative for adenopathy. Bruises/bleeds easily.  Psychiatric/Behavioral: Positive for sleep disturbance. Negative for confusion and agitation. The patient is not nervous/anxious.        Objective:   Physical Exam Filed Vitals:   09/08/14 1010  BP: 126/72  Pulse: 92  Temp: 98.2 F (36.8 C)  TempSrc: Oral  Height: 5' 3.5" (1.613 m)  Weight: 138 lb (62.596 kg)  SpO2: 99%    Gen: Pleasant, well-nourished, in no distress,  normal affect  ENT: No lesions,  mouth clear,  oropharynx clear, +++  postnasal drip, moderate rhinitis without purulence  Neck: No JVD, no TMG, no carotid bruits  Lungs: No use of accessory muscles, no dullness to percussion, less prominent pseudowheeze   Cardiovascular: RRR, heart sounds normal, no murmur or gallops, no peripheral edema  Abdomen: tender in epigastric area, no HSM,  BS normal  Musculoskeletal: No deformities, no cyanosis or clubbing  Neuro: alert, non focal  Skin: Warm, no lesions or rashes     Assessment & Plan:   Asthma, severe persistent, with upper airway instability Severe persistent asthma  With upper airway instability syndrome Recent sinusitis and flare now improving Plan Finish antibiotics and prednisone Stay on Dulera, use sample spacer prevnar 13 vaccine was given Return 6 months     Updated Medication List Outpatient Encounter Prescriptions as of 09/08/2014  Medication Sig  . albuterol (VENTOLIN HFA) 108 (90 BASE) MCG/ACT inhaler Inhale 2 puffs  into the lungs every 6 (six) hours as needed.    . benzonatate (TESSALON) 100 MG capsule TAKE 1-2 CAPSULES EVERY 4 HOURS FOR COUGH  . cetirizine  (ZYRTEC) 10 MG chewable tablet Chew 1 tablet (10 mg total) by mouth daily.  . DULERA 200-5 MCG/ACT AERO INHALE 2 PUFFS INTO THE LUNGS 2 (TWO) TIMES DAILY.  . furosemide (LASIX) 20 MG tablet Take 1 tablet by mouth daily.   Marland Kitchen levofloxacin (LEVAQUIN) 750 MG tablet Take 750 mg by mouth daily.  . pantoprazole (PROTONIX) 40 MG tablet Take 1 tablet by mouth 2 (two) times daily.   . predniSONE (DELTASONE) 10 MG tablet Take 10 mg by mouth. 2 week taper  . ranitidine (ZANTAC) 300 MG capsule TAKE ONE CAPSULE BY MOUTH AT BEDTIME  . spironolactone (ALDACTONE) 25 MG tablet Take 4 tablets by mouth daily.   Marland Kitchen KLOR-CON M20 20 MEQ tablet 1 tablet daily.  Marland Kitchen Spacer/Aero-Holding Chambers (AEROCHAMBER MV) inhaler Use as instructed

## 2014-09-08 NOTE — Patient Instructions (Signed)
Finish antibiotics and prednisone Stay on Dulera, use sample spacer prevnar 13 vaccine was given Return 6 months

## 2014-09-09 NOTE — Assessment & Plan Note (Signed)
Severe persistent asthma  With upper airway instability syndrome Recent sinusitis and flare now improving Plan Finish antibiotics and prednisone Stay on Dulera, use sample spacer prevnar 13 vaccine was given Return 6 months

## 2014-10-03 ENCOUNTER — Other Ambulatory Visit: Payer: Self-pay | Admitting: Critical Care Medicine

## 2014-10-04 ENCOUNTER — Other Ambulatory Visit: Payer: Self-pay | Admitting: Critical Care Medicine

## 2014-10-11 ENCOUNTER — Other Ambulatory Visit: Payer: Self-pay | Admitting: Critical Care Medicine

## 2014-10-12 ENCOUNTER — Telehealth: Payer: Self-pay | Admitting: Critical Care Medicine

## 2014-10-12 NOTE — Telephone Encounter (Signed)
Advised pt that if she wants PW to review these results she would need to contact whoever did the xray and sign a records release to have them send PW the results. She agreed and verbalized understanding. Will forward to Crystal so that she can follow up on this.

## 2014-10-13 NOTE — Telephone Encounter (Signed)
lmomtcb for pt to verify if this was a cxr and to see if she has contacted [facility] yet to have results sent to [place]

## 2014-10-14 NOTE — Telephone Encounter (Signed)
lmomtcb for pt 

## 2014-10-17 NOTE — Telephone Encounter (Signed)
LMTCB

## 2014-10-20 NOTE — Telephone Encounter (Signed)
lmomtcb for pt 

## 2014-10-24 ENCOUNTER — Telehealth: Payer: Self-pay | Admitting: Critical Care Medicine

## 2014-10-24 NOTE — Telephone Encounter (Signed)
Received 3 pages from [place] to Dr. Joya Gaskins. 10/24/14/ss

## 2014-10-24 NOTE — Telephone Encounter (Signed)
Noted,  Will review

## 2014-10-24 NOTE — Telephone Encounter (Signed)
Pt aware results were received and will be reviewed by Dr. Joya Gaskins.

## 2014-10-24 NOTE — Telephone Encounter (Signed)
CXR results received and placed in PW's folder for review.

## 2014-10-24 NOTE — Telephone Encounter (Signed)
Spoke with pt- reports she was seen at [facility] on [address] who sent her to [facility] at [address] for cxr.  Pt reports she had this done approx 2 wks ago and has signed a ROI and faxed it to [facility] asking to release this information to our office.   [employer] at [phone].  Spoke with [employer].  She will fax results to triage.  Will await results.

## 2014-10-27 NOTE — Telephone Encounter (Signed)
Called spoke with pt. Made her aware we refer to [facility]NT usually. She will look into this. Will forward back to PW.

## 2014-10-27 NOTE — Telephone Encounter (Signed)
Also wants name of an ent for ear pain [phone]

## 2014-10-31 NOTE — Telephone Encounter (Signed)
i saw CXR.  Not sure she actually has PNA, An OV would help  [name] is my rec

## 2014-10-31 NOTE — Telephone Encounter (Signed)
Called spoke with pt. appt scheduled to see PW 12/01/14 in [place] per pt request. Nothing further needed

## 2014-12-01 ENCOUNTER — Ambulatory Visit: Payer: Self-pay | Admitting: Critical Care Medicine

## 2014-12-15 ENCOUNTER — Ambulatory Visit: Payer: Self-pay | Admitting: Critical Care Medicine

## 2014-12-20 ENCOUNTER — Encounter (INDEPENDENT_AMBULATORY_CARE_PROVIDER_SITE_OTHER): Payer: Self-pay

## 2014-12-20 ENCOUNTER — Encounter: Payer: Self-pay | Admitting: Adult Health

## 2014-12-20 ENCOUNTER — Ambulatory Visit (INDEPENDENT_AMBULATORY_CARE_PROVIDER_SITE_OTHER): Payer: BLUE CROSS/BLUE SHIELD | Admitting: Adult Health

## 2014-12-20 ENCOUNTER — Ambulatory Visit (INDEPENDENT_AMBULATORY_CARE_PROVIDER_SITE_OTHER)
Admission: RE | Admit: 2014-12-20 | Discharge: 2014-12-20 | Disposition: A | Discharge status: Home / Self Care | Payer: BLUE CROSS/BLUE SHIELD | Source: Ambulatory Visit | Attending: Adult Health | Admitting: Adult Health

## 2014-12-20 VITALS — BP 124/72 | HR 87 | Temp 98.7°F | Ht 63.0 in | Wt 141.0 lb

## 2014-12-20 DIAGNOSIS — J4551 Severe persistent asthma with (acute) exacerbation: Secondary | ICD-10-CM

## 2014-12-20 NOTE — Assessment & Plan Note (Signed)
Recurrent exacerbation with bronchitis and sinusitis  Has had several abx and steroids with recurrent sx.  ENT evaluation with reported neg CT sinus per pt .  She is currently improved with cyclical cough >will try to prevent triggers Repeat CXR today   Plan  Begin Delsym 2 tsp Twice daily for cough.  Tessalon Three times a day  For cough  Begin Zyrtec 10mg  in am for drainage  Begin Chlortrimeton 4mg  2 At bedtime  For drainage.  Saline nasal rinses As needed   Chest xray today  Please contact office for sooner follow up if symptoms do not improve or worsen or seek emergency care  Follow up Dr. Joya Gaskins  In 6 weeks and As needed

## 2014-12-20 NOTE — Progress Notes (Signed)
   Subjective:    Patient ID: Alicia [name], female    DOB: 1960/10/15, 54 y.o.   MRN: [id number]  HPI 54 yo female with severe persistent asthma  12/20/2014 Acute OV  Pt presents for an acute office visit.  Complains of recurrent cough , congestion ,nasal congestion, drainage throat clearing and tightness, low grade fever that wax and wane.  Says she has been seen at  [facility] PCP , seen them several times for recurrent sinus infection/bronchtis .  tx w/ 4 abx. Over 6 weeks Dec and Jan.  Referred to ENT in January . Had Chest xray and CT sinus -informed no acute infection  CXR report reviewed showed ? PNA  Left base.  Right now she is some better but still has cough and drainage  No chest pain , orthopnea, edema or hemotpysis , rash or joint swelling.    Review of Systems Constitutional:   No  weight loss, night sweats,  Fevers, chills, fatigue, or  lassitude.  HEENT:   No headaches,  Difficulty swallowing,  Tooth/dental problems, or  Sore throat,                No sneezing, itching, ear ache,  +nasal congestion, post nasal drip,   CV:  No chest pain,  Orthopnea, PND, swelling in lower extremities, anasarca, dizziness, palpitations, syncope.   GI  No heartburn, indigestion, abdominal pain, nausea, vomiting, diarrhea, change in bowel habits, loss of appetite, bloody stools.   Resp   No chest wall deformity  Skin: no rash or lesions.  GU: no dysuria, change in color of urine, no urgency or frequency.  No flank pain, no hematuria   MS:  No joint pain or swelling.  No decreased range of motion.  No back pain.  Psych:  No change in mood or affect. No depression or anxiety.  No memory loss.         Objective:   Physical Exam GEN: A/Ox3; pleasant , NAD, well nourished , recurrent throat clearing.   HEENT:  [place]/AT,  EACs-clear, TMs-wnl, NOSE-clear drainage THROAT-clear, no lesions, no postnasal drip or exudate noted.   NECK:  Supple w/ fair ROM; no JVD; normal carotid  impulses w/o bruits; no thyromegaly or nodules palpated; no lymphadenopathy.  RESP  Clear  P & A; w/o, wheezes/ rales/ or rhonchi.no accessory muscle use, no dullness to percussion  CARD:  RRR, no m/r/g  , no peripheral edema, pulses intact, no cyanosis or clubbing.  GI:   Soft & nt; nml bowel sounds; no organomegaly or masses detected.  Musco: Warm bil, no deformities or joint swelling noted.   Neuro: alert, no focal deficits noted.    Skin: Warm, no lesions or rashes         Assessment & Plan:

## 2014-12-20 NOTE — Patient Instructions (Signed)
Begin Delsym 2 tsp Twice daily for cough.  Tessalon Three times a day  For cough  Begin Zyrtec 10mg  in am for drainage  Begin Chlortrimeton 4mg  2 At bedtime  For drainage.  Saline nasal rinses As needed   Chest xray today  Please contact office for sooner follow up if symptoms do not improve or worsen or seek emergency care  Follow up Dr. Joya Gaskins  In 6 weeks and As needed

## 2014-12-20 NOTE — Progress Notes (Signed)
Quick Note:  Called spoke with patient, advised of cxr results / recs as stated by TP. Pt verbalized her understanding and denied any questions. ______ 

## 2015-03-09 ENCOUNTER — Ambulatory Visit (INDEPENDENT_AMBULATORY_CARE_PROVIDER_SITE_OTHER): Payer: BLUE CROSS/BLUE SHIELD | Admitting: Adult Health

## 2015-03-09 ENCOUNTER — Encounter: Payer: Self-pay | Admitting: Adult Health

## 2015-03-09 ENCOUNTER — Ambulatory Visit: Payer: BLUE CROSS/BLUE SHIELD | Admitting: Adult Health

## 2015-03-09 VITALS — BP 144/82 | HR 85 | Temp 98.1°F | Ht 63.0 in | Wt 144.0 lb

## 2015-03-09 DIAGNOSIS — J4551 Severe persistent asthma with (acute) exacerbation: Secondary | ICD-10-CM

## 2015-03-09 MED ORDER — HYDROCODONE-HOMATROPINE 5-1.5 MG/5ML PO SYRP: 5.0000 mL | ORAL_SOLUTION | Freq: Four times a day (QID) | ORAL | Status: DC | PRN

## 2015-03-09 NOTE — Patient Instructions (Addendum)
Prednisone taper over next week.   Delsym 2 tsp Twice daily for cough.  Tessalon Three times a day  For cough  Hydromet 1-2 tsp every 6hrs as cough , may make you sleepy.  Zyrtec 10mg  in am for drainage  Chlortrimeton 4mg  2 At bedtime  For drainage.  Saline nasal rinses As needed   Please contact office for sooner follow up if symptoms do not improve or worsen or seek emergency care  Follow up  3 months and As needed

## 2015-03-09 NOTE — Progress Notes (Signed)
   Subjective:    Patient ID: Alicia [name], female    DOB: 1961-03-22, 54 y.o.   MRN: [id number]  Asthma Her past medical history is significant for asthma.   54 yo never smoker female with severe persistent asthma Hx of hepatic fibrosis .   03/09/2015 Acute OV  Pt presents for an acute office visit.  Complains of non-productive cough with hoarseness ; drainage , wheezing and pain in side with coughing.  Taking zyrtec and chlortrimeton .  Says she feels good except for cough .  No fever, discolored mucus , chest pain, orthopnea or edema.  Cough is keeping her up at night.  On  Dulera .      Review of Systems Constitutional:   No  weight loss, night sweats,  Fevers, chills, fatigue, or  lassitude.  HEENT:   No headaches,  Difficulty swallowing,  Tooth/dental problems, or  Sore throat,                No sneezing, itching, ear ache,  +nasal congestion, post nasal drip,   CV:  No chest pain,  Orthopnea, PND, swelling in lower extremities, anasarca, dizziness, palpitations, syncope.   GI  No heartburn, indigestion, abdominal pain, nausea, vomiting, diarrhea, change in bowel habits, loss of appetite, bloody stools.   Resp   No chest wall deformity  Skin: no rash or lesions.  GU: no dysuria, change in color of urine, no urgency or frequency.  No flank pain, no hematuria   MS:  No joint pain or swelling.  No decreased range of motion.  No back pain.  Psych:  No change in mood or affect. No depression or anxiety.  No memory loss.         Objective:   Physical Exam GEN: A/Ox3; pleasant , NAD, well nourished , recurrent throat clearing.   HEENT:  [place]/AT,  EACs-clear, TMs-wnl, NOSE-clear drainage THROAT-clear, no lesions, no postnasal drip or exudate noted.   NECK:  Supple w/ fair ROM; no JVD; normal carotid impulses w/o bruits; no thyromegaly or nodules palpated; no lymphadenopathy.  RESP  Clear  P & A; w/o, wheezes/ rales/ or rhonchi.no accessory muscle use, no dullness  to percussion  CARD:  RRR, no m/r/g  , no peripheral edema, pulses intact, no cyanosis or clubbing.  GI:   Soft & nt; nml bowel sounds; no organomegaly or masses detected.  Musco: Warm bil, no deformities or joint swelling noted.   Neuro: alert, no focal deficits noted.    Skin: Warm, no lesions or rashes         Assessment & Plan:

## 2015-03-09 NOTE — Assessment & Plan Note (Signed)
Flare with URI   Plan  Prednisone taper over next week.   Delsym 2 tsp Twice daily for cough.  Tessalon Three times a day  For cough  Hydromet 1-2 tsp every 6hrs as cough , may make you sleepy.  Zyrtec 10mg  in am for drainage  Chlortrimeton 4mg  2 At bedtime  For drainage.  Saline nasal rinses As needed   Please contact office for sooner follow up if symptoms do not improve or worsen or seek emergency care  Follow up  3 months and As needed

## 2015-03-10 ENCOUNTER — Telehealth: Payer: Self-pay | Admitting: Adult Health

## 2015-03-10 MED ORDER — PREDNISONE 10 MG PO TABS: ORAL_TABLET | ORAL | Status: DC

## 2015-03-10 NOTE — Telephone Encounter (Signed)
Patient was suppose to have a prednisone taper sent to her pharmacy on Thursday, but it was not sent to pharmacy.  TP - what taper did you want to send?  Please advise.

## 2015-03-10 NOTE — Telephone Encounter (Signed)
[place] for pred taper Rx sent to [facility] Left detailed message on named voice mail informing her of the above  Will sign off

## 2015-03-13 ENCOUNTER — Telehealth: Payer: Self-pay | Admitting: *Deleted

## 2015-03-13 MED ORDER — PREDNISONE 10 MG PO TABS: ORAL_TABLET | ORAL | Status: DC

## 2015-03-13 NOTE — Telephone Encounter (Signed)
RX for prednisone has been sent in. Nothing further needed

## 2015-03-13 NOTE — Telephone Encounter (Signed)
Pt called stating the prednisone was never received that was suppose to be called in last week by TP. Please advise thanks

## 2015-06-09 ENCOUNTER — Other Ambulatory Visit: Payer: Self-pay | Admitting: Critical Care Medicine

## 2015-06-15 ENCOUNTER — Other Ambulatory Visit: Payer: Self-pay | Admitting: Critical Care Medicine

## 2015-06-22 ENCOUNTER — Ambulatory Visit (INDEPENDENT_AMBULATORY_CARE_PROVIDER_SITE_OTHER): Payer: BLUE CROSS/BLUE SHIELD | Admitting: Adult Health

## 2015-06-22 ENCOUNTER — Ambulatory Visit (HOSPITAL_BASED_OUTPATIENT_CLINIC_OR_DEPARTMENT_OTHER)
Admission: RE | Admit: 2015-06-22 | Discharge: 2015-06-22 | Disposition: A | Discharge status: Home / Self Care | Payer: BLUE CROSS/BLUE SHIELD | Source: Ambulatory Visit | Attending: Adult Health | Admitting: Adult Health

## 2015-06-22 ENCOUNTER — Encounter: Payer: Self-pay | Admitting: Adult Health

## 2015-06-22 VITALS — BP 124/72 | HR 85 | Ht 63.0 in | Wt 164.0 lb

## 2015-06-22 DIAGNOSIS — J45901 Unspecified asthma with (acute) exacerbation: Secondary | ICD-10-CM

## 2015-06-22 DIAGNOSIS — R918 Other nonspecific abnormal finding of lung field: Secondary | ICD-10-CM | POA: Diagnosis not present

## 2015-06-22 DIAGNOSIS — J90 Pleural effusion, not elsewhere classified: Secondary | ICD-10-CM | POA: Diagnosis not present

## 2015-06-22 DIAGNOSIS — J4551 Severe persistent asthma with (acute) exacerbation: Secondary | ICD-10-CM

## 2015-06-22 DIAGNOSIS — J9811 Atelectasis: Secondary | ICD-10-CM | POA: Diagnosis not present

## 2015-06-22 DIAGNOSIS — R079 Chest pain, unspecified: Secondary | ICD-10-CM | POA: Insufficient documentation

## 2015-06-22 DIAGNOSIS — R05 Cough: Secondary | ICD-10-CM | POA: Insufficient documentation

## 2015-06-22 MED ORDER — PREDNISONE 10 MG PO TABS: ORAL_TABLET | ORAL | Status: DC

## 2015-06-22 MED ORDER — BENZONATATE 100 MG PO CAPS: 100.0000 mg | ORAL_CAPSULE | Freq: Three times a day (TID) | ORAL | Status: DC | PRN

## 2015-06-22 MED ORDER — DOXYCYCLINE HYCLATE 100 MG PO TABS: 100.0000 mg | ORAL_TABLET | Freq: Two times a day (BID) | ORAL | Status: DC

## 2015-06-22 NOTE — Progress Notes (Signed)
   Subjective:    Patient ID: Alicia [name], female    DOB: 12/12/1960, 54 y.o.   MRN: [id number]  HPI 54 yo never smoker female with severe persistent asthma, vocal cord polyp Pulmonary nodules with serial CT follow up >CT chest 05/2013 with stable nodules since 06/2011 .  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  Does have clear mucus in am . Constant clearing throat . Feels like she got a  Cold initially but cant stop coughing . Feels like the ragweed is also causing her problems.  Remains on Dulera Twice daily  , zyrtec daily .  Last cxr in March with nad.  She says her  Leg swelling is getting worse over last 3 months .  Followed by Hepatology in [facility] for her hepatic fibrosis . She is on lasix 20-80mg  daily and aldactone  100mg  daily 04/11/15 (Dr. Vinnie Level)  Care everywhere note reviewed from  Says it was recently increased but did not help.  Weight is up 20lbs over last 3 months.  Also has skin tear on right forearm that is red. No streaking orfever.  She denies chest pain, orthopnea, hemoptysis, n/v/d or bleeding .    Review of Systems Constitutional:   No  weight loss, night sweats,  Fevers, chills, fatigue, or  lassitude.  HEENT:   No headaches,  Difficulty swallowing,  Tooth/dental problems, or  Sore throat,                No sneezing, itching, ear ache, nasal congestion, post nasal drip,   CV:  No chest pain,  Orthopnea, PND,  , anasarca, dizziness, palpitations, syncope.   GI  No heartburn, indigestion, abdominal pain, nausea, vomiting, diarrhea, change in bowel habits, loss of appetite, bloody stools.   Resp:    No chest wall deformity  Skin: no rash or lesions.  GU: no dysuria, change in color of urine, no urgency or frequency.  No flank pain, no hematuria   MS:  No joint pain or swelling.   No decreased range of motion.  No back pain.  Psych:  No change in mood or affect. No depression or anxiety.  No memory loss.         Objective:   Physical Exam GEN: A/Ox3; pleasant , NAD, overweight   HEENT:  [place]/AT,  EACs-clear, TMs-wnl, NOSE-clear, THROAT-clear, no lesions, no postnasal drip or exudate noted.   NECK:  Supple w/ fair ROM; no JVD; normal carotid impulses w/o bruits; no thyromegaly or nodules palpated; no lymphadenopathy.  RESP  Few trace wheezes  No rales/ or rhonchi.no accessory muscle use, no dullness to percussion  CARD:  RRR, no m/r/g  , 1-2+ peripheral edema, pulses intact, no cyanosis or clubbing.  GI:   Soft & nt; nml bowel sounds; no organomegaly or masses detected.  Musco: Warm bil, no deformities or joint swelling noted.   Neuro: alert, no focal deficits noted.    Skin: Warm, no lesions or rashes         Assessment & Plan:

## 2015-06-22 NOTE — Assessment & Plan Note (Signed)
Flare with URI /AR  Check cxr today   Plan  Doxycycline 100mg  Twice daily  For 7 days , take with food .  May cause sunburn, use sunscreen  Mucinex DM Twice daily  As needed  Cough/congestion  Prednisone taper over next week.  Chest xray today .  Follow up Dr. Elsworth Soho  In 2- 3 months and As needed   Please contact office for sooner follow up if symptoms do not improve or worsen or seek emergency care

## 2015-06-22 NOTE — Assessment & Plan Note (Signed)
Cont follow up with [facility] Hepatology  Pt with more fluid retention despite lasix and aldactone . -? Unclear dose  Advised to set up follow up ov asap to discuss fluid retention despite increased doses of diuretics.

## 2015-06-22 NOTE — Patient Instructions (Addendum)
Doxycycline 100mg  Twice daily  For 7 days , take with food .  May cause sunburn, use sunscreen  Mucinex DM Twice daily  As needed  Cough/congestion  Prednisone taper over next week.  If sore on arm does not improve see our primary doctor.  Call Liver specialist at [facility] for office visit to address your swelling  Chest xray today .  Follow up Dr. Elsworth Soho  In 2- 3 months and As needed   Please contact office for sooner follow up if symptoms do not improve or worsen or seek emergency care

## 2015-06-26 NOTE — Progress Notes (Signed)
Reviewed & agree with plan  

## 2015-06-26 NOTE — Progress Notes (Signed)
Quick Note:  Called spoke with patient, advised of cxr results / recs as stated by TP. Pt verbalized her understanding and denied any questions. Pt reported she does feel better and was able to see her hepatologist at [facility] and has follow up testing. ______

## 2015-09-07 ENCOUNTER — Encounter: Payer: Self-pay | Admitting: Pulmonary Disease

## 2015-09-07 ENCOUNTER — Ambulatory Visit (INDEPENDENT_AMBULATORY_CARE_PROVIDER_SITE_OTHER): Payer: BLUE CROSS/BLUE SHIELD | Admitting: Pulmonary Disease

## 2015-09-07 VITALS — BP 103/71 | HR 91 | Ht 63.0 in | Wt 162.0 lb

## 2015-09-07 DIAGNOSIS — R053 Chronic cough: Secondary | ICD-10-CM

## 2015-09-07 DIAGNOSIS — R05 Cough: Secondary | ICD-10-CM | POA: Diagnosis not present

## 2015-09-07 DIAGNOSIS — R918 Other nonspecific abnormal finding of lung field: Secondary | ICD-10-CM

## 2015-09-07 MED ORDER — PROMETHAZINE-CODEINE 6.25-10 MG/5ML PO SYRP: 5.0000 mL | ORAL_SOLUTION | Freq: Four times a day (QID) | ORAL | Status: DC | PRN

## 2015-09-07 NOTE — Assessment & Plan Note (Signed)
Treat as acute flare   Limited CT of sinuses Take Mucinex D 600 mg twice daily Take promethazine/codeine 62ml twice daily x 10 days Stay on [facility] and antireflux therapy with PPI and H2 blockers

## 2015-09-07 NOTE — Patient Instructions (Signed)
CT scan of chest without contrast Limited CT of sinuses Take Mucinex D 600 mg twice daily Take promethazine/codeine 110ml twice daily x 10 days Stay on [facility]

## 2015-09-07 NOTE — Progress Notes (Signed)
   Subjective:    Patient ID: Alicia [name], female    DOB: 1961/08/04, 54 y.o.   MRN: [id number]  HPI 54 yo never smoker female with severe persistent asthma, vocal cord polyp She has chronic hoarseness Pulmonary nodules with serial CT follow up >CT chest 05/2013 with stable nodules since 06/2011 .  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 She is on lasix  and aldactone daily 04/11/15 (Dr. Vinnie Level)    09/07/2015  Chief Complaint  Patient presents with  . Follow-up    breathing is not well.  Cannot get rid of cough    She was in 06/2015 Complains of 2 weeks of cough, congestion, drainage and wheezing .   Treated with doxycycline, prednisone tapering Mucinex DM- improved but now worse again  Cough has returned-clear mucus in a.m. and 2 units p.m.Marland Kitchen Constant clearing throat .   She admits to a sinus drip Does have chronic reflux due to the liver issues She is on diuretics but has pedal edema in spite of  Compliant with [facility]   She says her  Leg swelling is getting worse over last 3 months .     PFTs 07/2011-FEV1 63%, moderate airway obstruction, no bronchodilator response Chest x-ray 06/2015 shows increased reticular nodular appearance  Review of Systems neg for any significant sore throat, dysphagia, itching, sneezing, nasal congestion or excess/ purulent secretions, fever, chills, sweats, unintended wt loss, pleuritic or exertional cp, hempoptysis, orthopnea pnd or change in chronic leg swelling. Also denies presyncope, palpitations, heartburn, abdominal pain, nausea, vomiting, diarrhea or change in bowel or urinary habits, dysuria,hematuria, rash, arthralgias, visual complaints, headache, numbness weakness or ataxia.     Objective:   Physical Exam  Gen. Pleasant, well-nourished, in no distress ENT - no lesions, no post nasal drip Neck: No JVD, no thyromegaly, no carotid bruits Lungs:  no use of accessory muscles, no dullness to percussion, clear without rales or rhonchi  Cardiovascular: Rhythm regular, heart sounds  normal, no murmurs or gallops, 2+ peripheral edema Musculoskeletal: No deformities, no cyanosis or clubbing        Assessment & Plan:

## 2015-09-07 NOTE — Assessment & Plan Note (Addendum)
CT scan of chest without contrast Chest x-ray does show increased reticular nodular appearance

## 2015-09-12 ENCOUNTER — Ambulatory Visit (HOSPITAL_BASED_OUTPATIENT_CLINIC_OR_DEPARTMENT_OTHER): Payer: BLUE CROSS/BLUE SHIELD

## 2015-09-14 ENCOUNTER — Telehealth: Payer: Self-pay | Admitting: Pulmonary Disease

## 2015-09-14 NOTE — Telephone Encounter (Signed)
Spoke with pt. She would like to reschedule the CT's that were scheduled for 09/12/15. Pt didn't get our message advising of these appointments until yesterday. She would like to NOT have these scheduled on 09/19/15. Thanks.

## 2015-09-14 NOTE — Telephone Encounter (Signed)
Called pt & gave her phone # to [facility] CT Dept so she can reschedule her CT scans. Nothing further needed.

## 2015-09-18 ENCOUNTER — Ambulatory Visit (HOSPITAL_BASED_OUTPATIENT_CLINIC_OR_DEPARTMENT_OTHER)
Admission: RE | Admit: 2015-09-18 | Discharge: 2015-09-18 | Disposition: A | Discharge status: Home / Self Care | Payer: BLUE CROSS/BLUE SHIELD | Source: Ambulatory Visit | Attending: Pulmonary Disease | Admitting: Pulmonary Disease

## 2015-09-18 DIAGNOSIS — R918 Other nonspecific abnormal finding of lung field: Secondary | ICD-10-CM

## 2015-09-18 DIAGNOSIS — R05 Cough: Secondary | ICD-10-CM

## 2015-09-18 DIAGNOSIS — K7460 Unspecified cirrhosis of liver: Secondary | ICD-10-CM | POA: Diagnosis not present

## 2015-09-18 DIAGNOSIS — R053 Chronic cough: Secondary | ICD-10-CM

## 2015-09-18 DIAGNOSIS — R0602 Shortness of breath: Secondary | ICD-10-CM | POA: Insufficient documentation

## 2015-09-18 DIAGNOSIS — K769 Liver disease, unspecified: Secondary | ICD-10-CM | POA: Diagnosis not present

## 2015-09-18 DIAGNOSIS — I864 Gastric varices: Secondary | ICD-10-CM | POA: Insufficient documentation

## 2015-09-18 DIAGNOSIS — I8500 Esophageal varices without bleeding: Secondary | ICD-10-CM | POA: Diagnosis not present

## 2015-11-09 ENCOUNTER — Ambulatory Visit (INDEPENDENT_AMBULATORY_CARE_PROVIDER_SITE_OTHER): Payer: BLUE CROSS/BLUE SHIELD | Admitting: Pulmonary Disease

## 2015-11-09 ENCOUNTER — Encounter: Payer: Self-pay | Admitting: Pulmonary Disease

## 2015-11-09 ENCOUNTER — Other Ambulatory Visit: Payer: Self-pay | Admitting: Pulmonary Disease

## 2015-11-09 VITALS — BP 108/71 | HR 88 | Ht 63.0 in | Wt 156.0 lb

## 2015-11-09 DIAGNOSIS — R918 Other nonspecific abnormal finding of lung field: Secondary | ICD-10-CM

## 2015-11-09 DIAGNOSIS — R05 Cough: Secondary | ICD-10-CM

## 2015-11-09 DIAGNOSIS — J4550 Severe persistent asthma, uncomplicated: Secondary | ICD-10-CM | POA: Diagnosis not present

## 2015-11-09 DIAGNOSIS — R053 Chronic cough: Secondary | ICD-10-CM

## 2015-11-09 MED ORDER — PROMETHAZINE-CODEINE 6.25-10 MG/5ML PO SYRP: 5.0000 mL | ORAL_SOLUTION | Freq: Four times a day (QID) | ORAL | Status: DC | PRN

## 2015-11-09 NOTE — Assessment & Plan Note (Addendum)
These nodules have been present since 2012 but have increased in size when compared to 2014. Unclear if they represent inflammatory lesions or less likely malignancy in this never smoker. Potential diagnosis would also include sarcoidosis Obtain lab reports from [facility] -especially pro-time, before considering bronchoscopy Discuss with GI Follow-up CT scan in 4 months for nodules

## 2015-11-09 NOTE — Patient Instructions (Signed)
Refill on codeine syrup Obtain lab reports from [facility] with GI Follow-up CT scan in 4 months for nodules Stay on dulera and albuterol in the meantime

## 2015-11-09 NOTE — Progress Notes (Signed)
   Subjective:    Patient ID: Alicia [name], female    DOB: 1960/11/18, 55 y.o.   MRN: [id number]  HPI  55 yo never smoker with severe persistent asthma, vocal cord polyp She has chronic hoarseness She has multiple Pulmonary nodules first noted 06/2011 .  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 She is on lasix  and aldactone daily 04/11/15 (Dr. Ester Rink)    11/09/2015   Chief Complaint  Patient presents with  . Follow-up    Pt is here for a 2 month follow up. Pt c/o SOB with activity, sinus drainage/pressure, occasional wheezing, and cough. Denies any chest congestion/tightness, fever, nausea or vomiting.     2 month follow-up Reports increasing dyspnea, chronic hoarseness, and occasional wheezing She is maintained on dulera and is compliant  Constant clearing throat .  She admits to a sinus drip Does have chronic reflux due to the liver issues-but this seems to be controlled on an aggressive regimen of twice a day Protonix and ranitidine Codeine cough syrup is the only thing that helps with cough She is on diuretics but has pedal edema in spite of  Compliant with [facility]  She recently underwent EGD at [facility]  CT chest showed slight increase in size of nodules especially in the right upper lobe MRI of the liver also showed multiple nodules and cysts    Significant tests/ events   PFTs 07/2011-FEV1 63%, moderate airway obstruction, no bronchodilator response Chest x-ray 06/2015 shows increased reticular nodular appearance CT sinuses clear CT chest 08/2015 >>Innumerable (> 40) subcentimeter solid pulmonary nodules throughout both lungs, largest 7 mm in the right upper lobe, generally centrilobular in distribution, all of which have mildly increased in size since 06/28/2013 Cirrhosis with varices, new liver lesion  MRI liver 10/2015 >> numerous siderotic nodules   Past Medical History    Diagnosis Date  . Anemia   . Asthma   . Allergy   . Hepatic fibrosis ([place])      Review of Systems  neg for any significant sore throat, dysphagia, itching, sneezing, nasal congestion or excess/ purulent secretions, fever, chills, sweats, unintended wt loss, pleuritic or exertional cp, hempoptysis, orthopnea pnd or change in chronic leg swelling.  Also denies presyncope, palpitations, heartburn, abdominal pain, nausea, vomiting, diarrhea or change in bowel or urinary habits, dysuria,hematuria, rash, arthralgias, visual complaints, headache, numbness weakness or ataxia.     Objective:   Physical Exam  Gen. Pleasant, well-nourished, in no distress ENT - no lesions, no post nasal drip Neck: No JVD, no thyromegaly, no carotid bruits Lungs: no use of accessory muscles, no dullness to percussion, clear without rales or rhonchi  Cardiovascular: Rhythm regular, heart sounds  normal, no murmurs or gallops, no peripheral edema Musculoskeletal: No deformities, no cyanosis or clubbing         Assessment & Plan:

## 2015-11-09 NOTE — Assessment & Plan Note (Signed)
Refill on codeine syrup

## 2015-11-09 NOTE — Assessment & Plan Note (Signed)
Stay on dulera and albuterol in the meantime

## 2015-11-28 ENCOUNTER — Telehealth: Payer: Self-pay | Admitting: Pulmonary Disease

## 2015-11-28 DIAGNOSIS — J449 Chronic obstructive pulmonary disease, unspecified: Secondary | ICD-10-CM

## 2015-11-28 NOTE — Telephone Encounter (Signed)
Spoke with pt  He states Dr Rufina Falco and he okayed her to have Bronch  You can review his notes in care everywhere  Please advise thanks!

## 2015-11-29 NOTE — Telephone Encounter (Signed)
Cannot see in care everywhere Can you get me dr Haynes Dage # please?  Also pl advise pt to get labs in anticipation - CBC, CMET, PT/PTT

## 2015-11-29 NOTE — Telephone Encounter (Signed)
Spoke with pt, aware of recs.  Labs ordered for pt.  Dr. Haynes Dage office # [phone]  RA please advise.  Thanks.

## 2015-12-01 ENCOUNTER — Other Ambulatory Visit (INDEPENDENT_AMBULATORY_CARE_PROVIDER_SITE_OTHER): Payer: BLUE CROSS/BLUE SHIELD

## 2015-12-01 DIAGNOSIS — J45909 Unspecified asthma, uncomplicated: Secondary | ICD-10-CM

## 2015-12-01 DIAGNOSIS — J449 Chronic obstructive pulmonary disease, unspecified: Secondary | ICD-10-CM

## 2015-12-01 LAB — CBC WITH DIFFERENTIAL/PLATELET
BASOS ABS: 0 10*3/uL (ref 0.0–0.1)
Basophils Relative: 0 % (ref 0–1)
EOS ABS: 0.1 10*3/uL (ref 0.0–0.7)
Eosinophils Relative: 2 % (ref 0–5)
HCT: 36.9 % (ref 36.0–46.0)
Hemoglobin: 12.9 g/dL (ref 12.0–15.0)
LYMPHS ABS: 1.4 10*3/uL (ref 0.7–4.0)
LYMPHS PCT: 30 % (ref 12–46)
MCH: 33.1 pg (ref 26.0–34.0)
MCHC: 35 g/dL (ref 30.0–36.0)
MCV: 94.6 fL (ref 78.0–100.0)
MPV: 11.9 fL (ref 8.6–12.4)
Monocytes Absolute: 0.6 10*3/uL (ref 0.1–1.0)
Monocytes Relative: 12 % (ref 3–12)
NEUTROS PCT: 56 % (ref 43–77)
Neutro Abs: 2.7 10*3/uL (ref 1.7–7.7)
PLATELETS: 95 10*3/uL — AB (ref 150–400)
RBC: 3.9 MIL/uL (ref 3.87–5.11)
RDW: 14.3 % (ref 11.5–15.5)
WBC: 4.8 10*3/uL (ref 4.0–10.5)

## 2015-12-01 LAB — COMPREHENSIVE METABOLIC PANEL
ALK PHOS: 212 U/L — AB (ref 33–130)
ALT: 44 U/L — AB (ref 6–29)
AST: 62 U/L — ABNORMAL HIGH (ref 10–35)
Albumin: 3.1 g/dL — ABNORMAL LOW (ref 3.6–5.1)
BILIRUBIN TOTAL: 3 mg/dL — AB (ref 0.2–1.2)
BUN: 8 mg/dL (ref 7–25)
CO2: 28 mmol/L (ref 20–31)
CREATININE: 1.02 mg/dL (ref 0.50–1.05)
Calcium: 8.1 mg/dL — ABNORMAL LOW (ref 8.6–10.4)
Chloride: 104 mmol/L (ref 98–110)
Glucose, Bld: 87 mg/dL (ref 65–99)
POTASSIUM: 3.6 mmol/L (ref 3.5–5.3)
Sodium: 139 mmol/L (ref 135–146)
TOTAL PROTEIN: 5.9 g/dL — AB (ref 6.1–8.1)

## 2015-12-04 LAB — APTT: APTT: 41 s — AB (ref 24–37)

## 2015-12-08 ENCOUNTER — Encounter (HOSPITAL_COMMUNITY)
Admission: RE | Disposition: A | Discharge status: Home / Self Care | Payer: Self-pay | Source: Ambulatory Visit | Attending: Pulmonary Disease

## 2015-12-08 ENCOUNTER — Encounter (HOSPITAL_COMMUNITY): Payer: Self-pay | Admitting: Respiratory Therapy

## 2015-12-08 ENCOUNTER — Ambulatory Visit (HOSPITAL_COMMUNITY)
Admission: RE | Admit: 2015-12-08 | Discharge: 2015-12-08 | Disposition: A | Discharge status: Home / Self Care | Payer: BLUE CROSS/BLUE SHIELD | Source: Ambulatory Visit | Attending: Pulmonary Disease | Admitting: Pulmonary Disease

## 2015-12-08 ENCOUNTER — Ambulatory Visit (HOSPITAL_COMMUNITY): Payer: BLUE CROSS/BLUE SHIELD

## 2015-12-08 ENCOUNTER — Inpatient Hospital Stay (HOSPITAL_COMMUNITY): Admission: RE | Admit: 2015-12-08 | Payer: BLUE CROSS/BLUE SHIELD | Source: Ambulatory Visit

## 2015-12-08 DIAGNOSIS — Z9889 Other specified postprocedural states: Secondary | ICD-10-CM

## 2015-12-08 DIAGNOSIS — R05 Cough: Secondary | ICD-10-CM | POA: Insufficient documentation

## 2015-12-08 DIAGNOSIS — D1431 Benign neoplasm of right bronchus and lung: Secondary | ICD-10-CM | POA: Insufficient documentation

## 2015-12-08 DIAGNOSIS — R918 Other nonspecific abnormal finding of lung field: Secondary | ICD-10-CM | POA: Diagnosis present

## 2015-12-08 DIAGNOSIS — J8410 Pulmonary fibrosis, unspecified: Secondary | ICD-10-CM | POA: Diagnosis not present

## 2015-12-08 DIAGNOSIS — K219 Gastro-esophageal reflux disease without esophagitis: Secondary | ICD-10-CM | POA: Diagnosis not present

## 2015-12-08 HISTORY — PX: VIDEO BRONCHOSCOPY: SHX5072

## 2015-12-08 SURGERY — BRONCHOSCOPY, WITH FLUOROSCOPY
Anesthesia: Moderate Sedation | Laterality: Bilateral

## 2015-12-08 MED ORDER — MIDAZOLAM HCL 5 MG/ML IJ SOLN
INTRAMUSCULAR | Status: AC
Start: 2015-12-08 — End: 2015-12-08
  Filled 2015-12-08: qty 2

## 2015-12-08 MED ORDER — SODIUM CHLORIDE 0.9 % IV SOLN
INTRAVENOUS | Status: DC
  Administered 2015-12-08: 08:00:00 via INTRAVENOUS

## 2015-12-08 MED ORDER — LIDOCAINE HCL 2 % EX GEL
CUTANEOUS | Status: DC | PRN
  Administered 2015-12-08: 1

## 2015-12-08 MED ORDER — FENTANYL CITRATE (PF) 100 MCG/2ML IJ SOLN
INTRAMUSCULAR | Status: AC
  Filled 2015-12-08: qty 4

## 2015-12-08 MED ORDER — PHENYLEPHRINE HCL 0.25 % NA SOLN
NASAL | Status: DC | PRN
  Administered 2015-12-08: 2 via NASAL

## 2015-12-08 MED ORDER — MIDAZOLAM HCL 10 MG/2ML IJ SOLN
INTRAMUSCULAR | Status: DC | PRN
  Administered 2015-12-08 (×4): 1 mg via INTRAVENOUS

## 2015-12-08 MED ORDER — FENTANYL CITRATE (PF) 100 MCG/2ML IJ SOLN
25.0000 ug | INTRAMUSCULAR | Status: DC | PRN
  Administered 2015-12-08 (×4): 25 ug via INTRAVENOUS

## 2015-12-08 MED ORDER — LIDOCAINE HCL (PF) 1 % IJ SOLN
INTRAMUSCULAR | Status: DC | PRN
  Administered 2015-12-08: 6 mL

## 2015-12-08 NOTE — Discharge Instructions (Signed)
Flexible Bronchoscopy, Care After These instructions give you information on caring for yourself after your procedure. Your doctor may also give you more specific instructions. Call your doctor if you have any problems or questions after your procedure. HOME CARE  Do not eat or drink anything for 2 hours after your procedure. If you try to eat or drink before the medicine wears off, food or drink could go into your lungs. You could also burn yourself.  After 2 hours have passed and when you can cough and gag normally, you may eat soft food and drink liquids slowly.  The day after the test, you may eat your normal diet.  You may do your normal activities.  Keep all doctor visits. GET HELP RIGHT AWAY IF:  You get more and more short of breath.  You get light-headed.  You feel like you are going to pass out (faint).  You have chest pain.  You have new problems that worry you.  You cough up more than a little blood.  You cough up more blood than before. MAKE SURE YOU:  Understand these instructions.  Will watch your condition.  Will get help right away if you are not doing well or get worse.   This information is not intended to replace advice given to you by your health care provider. Make sure you discuss any questions you have with your health care provider.  Do not eat or drink anything until 10:30 am on 12/08/2015.   Document Released: 07/14/2009 Document Revised: 09/21/2013 Document Reviewed: 05/21/2013 Elsevier Interactive Patient Education [employer].

## 2015-12-08 NOTE — H&P (View-Only) (Signed)
   Subjective:    Patient ID: Alicia [name], female    DOB: [birth year], 55 y.o.   MRN: [id number]  HPI  55 yo never smoker with severe persistent asthma, vocal cord polyp She has chronic hoarseness She has multiple Pulmonary nodules first noted 06/2011 .  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 She is on lasix  and aldactone daily 04/11/15 (Dr. Ester Rink)    11/09/2015   Chief Complaint  Patient presents with  . Follow-up    55 Pt is here for a 2 month follow up. Pt c/o SOB with activity, sinus drainage/pressure, occasional wheezing, and cough. Denies any chest congestion/tightness, fever, nausea or vomiting.     2 month follow-up Reports increasing dyspnea, chronic hoarseness, and occasional wheezing She is maintained on dulera and is compliant  Constant clearing throat .  She admits to a sinus drip Does have chronic reflux due to the liver issues-but this seems to be controlled on an aggressive regimen of twice a day Protonix and ranitidine Codeine cough syrup is the only thing that helps with cough She is on diuretics but has pedal edema in spite of  Compliant with [facility]  She recently underwent EGD at [facility]  CT chest showed slight increase in size of nodules especially in the right upper lobe MRI of the liver also showed multiple nodules and cysts    Significant tests/ events   PFTs 07/2011-FEV1 63%, moderate airway obstruction, no bronchodilator response Chest x-ray 06/2015 shows increased reticular nodular appearance CT sinuses clear CT chest 08/2015 >>Innumerable (> 40) subcentimeter solid pulmonary nodules throughout both lungs, largest 7 mm in the right upper lobe, generally centrilobular in distribution, all of which have mildly increased in size since 06/28/2013 Cirrhosis with varices, new liver lesion  MRI liver 10/2015 >> numerous siderotic nodules   Past Medical History    Diagnosis Date  . Anemia   . Asthma   . Allergy   . Hepatic fibrosis ([place])      Review of Systems  neg for any significant sore throat, dysphagia, itching, sneezing, nasal congestion or excess/ purulent secretions, fever, chills, sweats, unintended wt loss, pleuritic or exertional cp, hempoptysis, orthopnea pnd or change in chronic leg swelling.  Also denies presyncope, palpitations, heartburn, abdominal pain, nausea, vomiting, diarrhea or change in bowel or urinary habits, dysuria,hematuria, rash, arthralgias, visual complaints, headache, numbness weakness or ataxia.     Objective:   Physical Exam  Gen. Pleasant, well-nourished, in no distress ENT - no lesions, no post nasal drip Neck: No JVD, no thyromegaly, no carotid bruits Lungs: no use of accessory muscles, no dullness to percussion, clear without rales or rhonchi  Cardiovascular: Rhythm regular, heart sounds  normal, no murmurs or gallops, no peripheral edema Musculoskeletal: No deformities, no cyanosis or clubbing         Assessment & Plan:

## 2015-12-08 NOTE — Interval H&P Note (Signed)
History and Physical Interval Note:  12/08/2015 8:08 AM  Alicia [name]  has presented today for surgery, with the diagnosis of Pulm nodule   The various methods of treatment have been discussed with the patient and family. After consideration of risks, benefits and other options for treatment, the patient has consented to  Procedure(s): VIDEO BRONCHOSCOPY WITH FLUORO (Bilateral) as a surgical intervention .  The patient's history has been reviewed, patient examined, no change in status, stable for surgery.  I have reviewed the patient's chart and labs.  Questions were answered to the patient's satisfaction.     ALVA,RAKESH V.

## 2015-12-08 NOTE — Op Note (Signed)
Indication: Bilateral unexplained small nodules in the 55 -year-old with hepatic fibrosis  Written informed consent was obtained from the patient prior to the procedure. The risks of the procedure including coughing, bleeding and a small chance of lung cancer requiring a chest tube were discussed with the patient in great detail and evidenced understanding.  4 mg of Versed and  160mcg of fentanyl were used in divided doses during the procedure. Bronchoscope was inserted from the right Nare. The upper airway appeared normal. Vocal cord showed normal appearance in motion. The trachea bronchial tree was then examined to the subsegmental level. No secretions were noted. No endobronchial lesions were noted.  Attention was then turned to the right lower lobe. Bronchoalveolar lavage was obtained from the right lower lobe with good return. Transbronchial biopsies x4 were obtained from the different subsegments of the right lower lobe. The patient tolerated procedure well with minimal bleeding.  A portable chest XR. will be performed to rule out presence of pneumothorax. She was awake and alert in the end of the procedure.  Specimens sent - TBBX  -BAL for afb, fungal, bacterial culture - TBBX for afb cx  Juli Odom V. MD [phone]

## 2015-12-08 NOTE — Progress Notes (Signed)
Video bronchoscopy performed.  Intervention bronchial biopsy. Intervention bronchial washing.  No complications noted.  Will continue to monitor.

## 2015-12-10 LAB — ACID FAST SMEAR (AFB, MYCOBACTERIA)
Acid Fast Smear: NEGATIVE
Acid Fast Smear: NEGATIVE

## 2015-12-10 LAB — CULTURE, BAL-QUANTITATIVE

## 2015-12-10 LAB — CULTURE, BAL-QUANTITATIVE W GRAM STAIN: Culture: NORMAL

## 2015-12-10 LAB — ACID FAST SMEAR (AFB)

## 2015-12-13 ENCOUNTER — Encounter (HOSPITAL_COMMUNITY): Payer: Self-pay | Admitting: Pulmonary Disease

## 2015-12-13 NOTE — Progress Notes (Signed)
Quick Note:  Called and spoke with pt. Reviewed results and recs. Scheduled pt for ov with TP on 12/21/15 at [facility] office. She voiced understanding and had no further questions. ______

## 2015-12-19 ENCOUNTER — Telehealth: Payer: Self-pay | Admitting: Internal Medicine

## 2015-12-19 ENCOUNTER — Other Ambulatory Visit: Payer: Self-pay | Admitting: *Deleted

## 2015-12-19 MED ORDER — RANITIDINE HCL 300 MG PO CAPS: 300.0000 mg | ORAL_CAPSULE | Freq: Every day | ORAL | Status: DC

## 2015-12-19 NOTE — Telephone Encounter (Signed)
Spoke with pharmacist, states that pt's insurance prefers a zantac tablet over a zantac capsule, wants to know if we are ok with this med switch.  I gave approval for med switch.  Nothing further needed.

## 2015-12-21 ENCOUNTER — Other Ambulatory Visit (INDEPENDENT_AMBULATORY_CARE_PROVIDER_SITE_OTHER): Payer: BLUE CROSS/BLUE SHIELD

## 2015-12-21 ENCOUNTER — Telehealth: Payer: Self-pay | Admitting: Adult Health

## 2015-12-21 ENCOUNTER — Ambulatory Visit: Payer: BLUE CROSS/BLUE SHIELD | Admitting: Adult Health

## 2015-12-21 ENCOUNTER — Ambulatory Visit (INDEPENDENT_AMBULATORY_CARE_PROVIDER_SITE_OTHER): Payer: BLUE CROSS/BLUE SHIELD | Admitting: Adult Health

## 2015-12-21 ENCOUNTER — Encounter: Payer: Self-pay | Admitting: Adult Health

## 2015-12-21 VITALS — BP 109/72 | HR 91 | Ht 63.0 in | Wt 151.0 lb

## 2015-12-21 DIAGNOSIS — R918 Other nonspecific abnormal finding of lung field: Secondary | ICD-10-CM

## 2015-12-21 DIAGNOSIS — J4550 Severe persistent asthma, uncomplicated: Secondary | ICD-10-CM | POA: Diagnosis not present

## 2015-12-21 LAB — ANGIOTENSIN CONVERTING ENZYME: Angiotensin-Converting Enzyme: 91 U/L — ABNORMAL HIGH (ref 8–52)

## 2015-12-21 MED ORDER — PROMETHAZINE-CODEINE 6.25-10 MG/5ML PO SYRP: 5.0000 mL | ORAL_SOLUTION | Freq: Four times a day (QID) | ORAL | Status: DC | PRN

## 2015-12-21 NOTE — Patient Instructions (Signed)
Labs today .   Delsym 2 tsp Twice daily for cough.  Tessalon Three times a day  For cough  Please contact office for sooner follow up if symptoms do not improve or worsen or seek emergency care  Follow up  6 weeks with Dr. Elsworth Soho   and As needed

## 2015-12-21 NOTE — Progress Notes (Signed)
Subjective:    Patient ID: Alicia [name], female    DOB: [birth year], 55 y.o.   MRN: [id number]  HPI 55 yo never smoker female with severe persistent asthma, vocal cord polyp Pulmonary nodules with serial CT follow up >CT chest 05/2013 with stable nodules since 06/2011 .  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 CT chest showed slight increase in size of nodules in RUL .  MIR of liver showed multiple nodules and cysts.  She underwent FOB on 12/08/15 , showed nml upper airway , nml Vocal cord motion. No endobronchial lesions. Negative for infection and biopsy did not show cancer.  Path showed scant benign lung tissue w/ non necrotizing granulatous inflammation .  We reviewed her biopsy results.  Remains on Dulera Twice daily  , zyrtec daily .   Denies hemoptysis , rash , fever, orthopnea or edema.   Past Medical History  Diagnosis Date  . Anemia   . Asthma   . Allergy   . Hepatic fibrosis ([place])    Current Outpatient Prescriptions on File Prior to Visit  Medication Sig Dispense Refill  . benzonatate (TESSALON) 100 MG capsule Take 1 capsule (100 mg total) by mouth every 8 (eight) hours as needed. (Patient taking differently: Take 100 mg by mouth every 8 (eight) hours as needed for cough. ) 30 capsule 3  . cetirizine (ZYRTEC) 10 MG chewable tablet Chew 1 tablet (10 mg total) by mouth daily. (Patient taking differently: Chew 10 mg by mouth daily as needed. ) 30 tablet 2  . chlorpheniramine (CHLOR-TRIMETON) 4 MG tablet Take 4 mg by mouth at bedtime. Reported on 11/09/2015    . dextromethorphan (DELSYM) 30 MG/5ML liquid Take 30 mg by mouth as needed for cough.    . dextromethorphan-guaiFENesin (MUCINEX DM) 30-600 MG 12hr tablet Take 1 tablet by mouth 2 (two) times daily as needed for cough. Reported on 11/09/2015    . DULERA 200-5 MCG/ACT AERO INHALE 2 PUFFS BY MOUTH TWO TIMES DAILY. 13  Inhaler 2  . furosemide (LASIX) 20 MG tablet Take 80 mg by mouth daily. Takes 80 mg for two days then 1 day without    . KLOR-CON M20 20 MEQ tablet Take 1 tablet by mouth daily. Reported on 11/09/2015  10  . pantoprazole (PROTONIX) 40 MG tablet Take 1 tablet by mouth 2 (two) times daily.     Marland Kitchen PROAIR HFA 108 (90 Base) MCG/ACT inhaler INHALE 2 PUFFS EVERY FOUR (4) HOURS AS NEEDED FOR WHEEZING. 8.5 Inhaler 0  . promethazine-codeine (PHENERGAN WITH CODEINE) 6.25-10 MG/5ML syrup Take 5 mLs by mouth every 6 (six) hours as needed for cough. 120 mL 0  . pseudoephedrine-guaifenesin (MUCINEX D) 60-600 MG 12 hr tablet Take 1 tablet by mouth every 12 (twelve) hours.    . ranitidine (ZANTAC) 300 MG capsule Take 1 capsule (300 mg total) by mouth at bedtime. 90 capsule 1  . Spacer/Aero-Holding Chambers (AEROCHAMBER MV) inhaler Use as instructed 1 each 0  . spironolactone (ALDACTONE) 100 MG tablet Take 200 mg by mouth daily.    Marland Kitchen spironolactone (ALDACTONE) 25 MG tablet Take 100 mg by mouth daily.      No current facility-administered medications on file prior to visit.     Review of Systems Constitutional:   No  weight loss, night sweats,  Fevers, chills, fatigue, or  lassitude.  HEENT:  No headaches,  Difficulty swallowing,  Tooth/dental problems, or  Sore throat,                No sneezing, itching, ear ache, nasal congestion, post nasal drip,   CV:  No chest pain,  Orthopnea, PND,  , anasarca, dizziness, palpitations, syncope.   GI  No heartburn, indigestion, abdominal pain, nausea, vomiting, diarrhea, change in bowel habits, loss of appetite, bloody stools.   Resp:    No chest wall deformity  Skin: no rash or lesions.  GU: no dysuria, change in color of urine, no urgency or frequency.  No flank pain, no hematuria   MS:  No joint pain or swelling.  No decreased range of motion.  No back pain.  Psych:  No change in mood or affect. No depression or anxiety.  No memory loss.           Objective:   Physical Exam Filed Vitals:   12/21/15 1134  BP: 109/72  Pulse: 91  Height: 5\' 3"  (1.6 m)  Weight: 151 lb (68.493 kg)  SpO2: 98%     GEN: A/Ox3; pleasant , NAD, overweight   HEENT:  [place]/AT,  EACs-clear, TMs-wnl, NOSE-clear, THROAT-clear, no lesions, no postnasal drip or exudate noted.   NECK:  Supple w/ fair ROM; no JVD; normal carotid impulses w/o bruits; no thyromegaly or nodules palpated; no lymphadenopathy.  RESP  Decreased BS , No rales/ or rhonchi.no accessory muscle use, no dullness to percussion  CARD:  RRR, no m/r/g  , tr -1+  peripheral edema, pulses intact, no cyanosis or clubbing.  GI:   Soft & nt; nml bowel sounds; no organomegaly or masses detected.  Musco: Warm bil, no deformities or joint swelling noted.   Neuro: alert, no focal deficits noted.    Skin: Warm, no lesions or rashes   Tammy Parrett NP-C  [facility]  12/21/2015       Assessment & Plan:

## 2015-12-21 NOTE — Telephone Encounter (Signed)
Pt was seen today by TP and STAT labs were ordered.  Received call report from Murray at [facility] stating ACE Levels were elevated.  Please advise. Thanks.

## 2015-12-22 ENCOUNTER — Other Ambulatory Visit: Payer: Self-pay | Admitting: Adult Health

## 2015-12-22 MED ORDER — PREDNISONE 20 MG PO TABS: 20.0000 mg | ORAL_TABLET | Freq: Every day | ORAL | Status: DC

## 2015-12-22 NOTE — Telephone Encounter (Signed)
Spoke with pt

## 2015-12-22 NOTE — Telephone Encounter (Signed)
Notes Recorded by Melvenia Needles, NP on 12/22/2015 at 4:35 PM ACE level is elevated with granulomas on bx c/w sarcoid process  Will start on prednisone 20mg  daily #30 , 1 refill .  Follow with Dr. Elsworth Soho In 3 weeks [place] or [place]  Pred has been sent to the pharmacy. Nothing further needed.

## 2015-12-22 NOTE — Progress Notes (Signed)
Quick Note:  Called and spoke with pt. Reviewed results and recs. Scheduled her for 01/09/16 at 3:45 with RA at [facility] office. She voiced understanding and had no further questions. Nothing further needed. ______

## 2015-12-28 NOTE — Assessment & Plan Note (Signed)
Persistent cough , ? Sarcoid  Check ACE level.  tx cough w/ tessalon/delsym  Cont on current regimen   Plan  Labs today .   Delsym 2 tsp Twice daily for cough.  Tessalon Three times a day  For cough  Please contact office for sooner follow up if symptoms do not improve or worsen or seek emergency care  Follow up  6 weeks with Dr. Elsworth Soho   and As needed

## 2015-12-28 NOTE — Assessment & Plan Note (Signed)
Increased nodules on most recent CT chest  S/p FOB with non necrotizing granulatous inflammation . ? Sarcoid  Check ACE level   Plan  Labs today .    Delsym 2 tsp Twice daily for cough.  Tessalon Three times a day  For cough  Please contact office for sooner follow up if symptoms do not improve or worsen or seek emergency care  Follow up  6 weeks with Dr. Elsworth Soho   and As needed

## 2016-01-08 ENCOUNTER — Other Ambulatory Visit: Payer: Self-pay | Admitting: Adult Health

## 2016-01-09 ENCOUNTER — Ambulatory Visit (INDEPENDENT_AMBULATORY_CARE_PROVIDER_SITE_OTHER): Payer: BLUE CROSS/BLUE SHIELD | Admitting: Pulmonary Disease

## 2016-01-09 ENCOUNTER — Telehealth: Payer: Self-pay | Admitting: Pulmonary Disease

## 2016-01-09 ENCOUNTER — Encounter: Payer: Self-pay | Admitting: Pulmonary Disease

## 2016-01-09 VITALS — BP 110/62 | HR 96 | Ht 64.0 in | Wt 153.6 lb

## 2016-01-09 DIAGNOSIS — R05 Cough: Secondary | ICD-10-CM

## 2016-01-09 DIAGNOSIS — D869 Sarcoidosis, unspecified: Secondary | ICD-10-CM

## 2016-01-09 DIAGNOSIS — R053 Chronic cough: Secondary | ICD-10-CM

## 2016-01-09 LAB — FUNGUS CULTURE WITH STAIN

## 2016-01-09 LAB — FUNGUS CULTURE RESULT

## 2016-01-09 LAB — FUNGAL ORGANISM REFLEX

## 2016-01-09 MED ORDER — PREDNISONE 5 MG PO TABS: ORAL_TABLET | ORAL | Status: DC

## 2016-01-09 MED ORDER — MOMETASONE FURO-FORMOTEROL FUM 200-5 MCG/ACT IN AERO: INHALATION_SPRAY | RESPIRATORY_TRACT | Status: DC

## 2016-01-09 NOTE — Assessment & Plan Note (Signed)
Rinse mouth after dulera - call if thrush feels worse

## 2016-01-09 NOTE — Assessment & Plan Note (Addendum)
We discussed course of sarcoidosis Schedule PFTs  Decrease prednisone to 10 mg daily - stay on this for 3 months & then  Slow taper

## 2016-01-09 NOTE — Telephone Encounter (Signed)
Called and spoke with Jenny Reichmann at [facility]. She states that the pharmacy was calling to get approval for a refill request. I explained to her that I would send the refill request in electronically. She voiced understanding and had no further questions. Rx sent. Nothing further needed.

## 2016-01-09 NOTE — Progress Notes (Signed)
   Subjective:    Patient ID: Alicia [name], female    DOB: 02-11-1961, 55 y.o.   MRN: [id number]  HPI  55 yo never smoker with severe persistent asthma, vocal cord polyp & multiple Pulmonary nodules first noted 06/2011 .  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 . She is on lasix  and aldactone daily 04/11/15 (Dr. Ester Rink)   01/09/2016  Chief Complaint  Patient presents with  . Follow-up    Discuss Bronchoscopy results.  ACE levels were high on blood work.  Per TP needed to be seen by Dr. Elsworth Soho ASAP.     She underwent FOB on 12/08/15 , showed nml upper airway , nml Vocal cord motion. No endobronchial lesions. Negative for infection and biopsy did not show cancer.  Path showed non necrotizing granulatous inflammation . ACE 91  Remains on Dulera Twice daily  , zyrtec daily .   She was started on 20 minute on prednisone about 2 weeks ago She has chronic hoarseness-this has improved Cough is much better Denies hemoptysis , rash , fever, orthopnea or edema.   Significant tests/ events   PFTs 07/2011-FEV1 63%, moderate airway obstruction, no bronchodilator response, nml TLC, DLCO 77%   CT sinuses clear CT chest 08/2015 >>Innumerable (> 40) subcentimeter solid pulmonary nodules throughout both lungs, largest 7 mm in the right upper lobe, generally centrilobular in distribution, all of which have mildly increased in size since 06/28/2013 Cirrhosis with varices, new liver lesion  MRI liver 10/2015 >> numerous siderotic nodules  Review of Systems Patient denies significant dyspnea,cough, hemoptysis,  chest pain, palpitations, pedal edema, orthopnea, paroxysmal nocturnal dyspnea, lightheadedness, nausea, vomiting, abdominal or  leg pains      Objective:   Physical Exam  Gen. Pleasant, well-nourished, in no distress ENT - no lesions, no post nasal drip Neck: No JVD, no thyromegaly, no carotid bruits Lungs: no use of accessory  muscles, no dullness to percussion, clear without rales or rhonchi  Cardiovascular: Rhythm regular, heart sounds  normal, no murmurs or gallops, no peripheral edema Musculoskeletal: No deformities, no cyanosis or clubbing         Assessment & Plan:

## 2016-01-09 NOTE — Patient Instructions (Signed)
We discussed course of sarcoidosis Schedule PFTs  Decrease prednisone to 10 mg daily  STOP codeine cough syrup once done Use DELSYm as needed Refills on tessalon  Rinse mouth after dulera - call if thrush feels worse

## 2016-01-09 NOTE — Assessment & Plan Note (Signed)
STOP codeine cough syrup once done Use DELSYm as needed Refills on tessalon

## 2016-01-09 NOTE — Telephone Encounter (Signed)
Error

## 2016-01-11 ENCOUNTER — Ambulatory Visit: Payer: BLUE CROSS/BLUE SHIELD | Admitting: Adult Health

## 2016-01-23 LAB — ACID FAST CULTURE WITH REFLEXED SENSITIVITIES
ACID FAST CULTURE - AFSCU3: NEGATIVE
ACID FAST CULTURE - AFSCU3: NEGATIVE

## 2016-02-01 ENCOUNTER — Telehealth: Payer: Self-pay | Admitting: Pulmonary Disease

## 2016-02-01 NOTE — Telephone Encounter (Signed)
Last ov with RA on 01/09/16 Patient Instructions     We discussed course of sarcoidosis Schedule PFTs  Decrease prednisone to 10 mg daily  STOP codeine cough syrup once done Use DELSYm as needed Refills on tessalon  Rinse mouth after dulera - call if thrush feels worse   Called spoke with pt. She c/o bloating and swelling due to the prednisone. She also states that since she decreased the prednisone to 10mg  her cough has returned.  She states that she would like RA's rec's before her visit on 04/25/16. I explained to her that I would send the message to RA and would return her call with his recs. She voiced understanding and had no further questions.  RA please advise

## 2016-02-01 NOTE — Telephone Encounter (Signed)
[phone], pt  cb

## 2016-02-01 NOTE — Telephone Encounter (Signed)
Stay on 10 mg Delsym 5 mL when necessary thrice daily for cough

## 2016-02-01 NOTE — Telephone Encounter (Signed)
lmtcb X1 for pt to relay recs.   

## 2016-02-01 NOTE — Telephone Encounter (Signed)
LMTCB

## 2016-02-01 NOTE — Telephone Encounter (Signed)
atc X2, line went to fast busy signal.  Wcb.

## 2016-02-02 NOTE — Telephone Encounter (Signed)
Spoke with pt. She is aware of RA's recommendations. Pt is still having lots of bilateral leg edema. She wants this addressed by RA since it wasn't addressed when RA responded.  RA - please advise on what the pt can do about swelling. Thanks.

## 2016-02-02 NOTE — Telephone Encounter (Signed)
lmtcb x1 for pt. 

## 2016-02-05 NOTE — Telephone Encounter (Signed)
Dr. Alva, please advise. 

## 2016-02-05 NOTE — Telephone Encounter (Signed)
Take extra 40 mg of Lasix daily for 5 days

## 2016-02-05 NOTE — Telephone Encounter (Signed)
Left message for patient to call back  

## 2016-02-06 NOTE — Telephone Encounter (Signed)
Spoke with pt. She is aware to take an extra 40mg  of lasix for 5 days. Nothing further was needed.

## 2016-03-12 ENCOUNTER — Encounter (INDEPENDENT_AMBULATORY_CARE_PROVIDER_SITE_OTHER): Payer: Self-pay

## 2016-03-12 ENCOUNTER — Encounter: Payer: Self-pay | Admitting: Pulmonary Disease

## 2016-03-12 ENCOUNTER — Ambulatory Visit (INDEPENDENT_AMBULATORY_CARE_PROVIDER_SITE_OTHER): Payer: BLUE CROSS/BLUE SHIELD | Admitting: Pulmonary Disease

## 2016-03-12 DIAGNOSIS — J45909 Unspecified asthma, uncomplicated: Secondary | ICD-10-CM

## 2016-03-12 DIAGNOSIS — J449 Chronic obstructive pulmonary disease, unspecified: Secondary | ICD-10-CM | POA: Diagnosis not present

## 2016-03-12 DIAGNOSIS — D869 Sarcoidosis, unspecified: Secondary | ICD-10-CM | POA: Diagnosis not present

## 2016-03-12 DIAGNOSIS — J4489 Other specified chronic obstructive pulmonary disease: Secondary | ICD-10-CM

## 2016-03-12 NOTE — Patient Instructions (Signed)
Schedule PFTs Based on this we will decide about low-dose prednisone Stay on Dulera twice daily

## 2016-03-12 NOTE — Assessment & Plan Note (Signed)
Schedule PFTs Based on this we will decide about low-dose prednisone

## 2016-03-12 NOTE — Progress Notes (Signed)
   Subjective:    Patient ID: Alicia [name], female    DOB: 1961/02/16, 55 y.o.   MRN: [id number]  HPI  55 yo never smoker with severe persistent asthma, vocal cord polyp & multiple Pulmonary nodules first noted 06/2011 .  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 She is on lasix  and aldactone daily 04/11/15 (Dr. Ester Rink)    03/12/2016  Chief Complaint  Patient presents with  . Sarcoidosis    Breathing is worse since last OV. Reports SOB, chest tightness, wheezing and coughing. Spent 5 days at [facility].   53m FU   She underwent FOB on 12/08/15 , showed nml upper airway , nml Vocal cord motion. No endobronchial lesions. Negative for infection and biopsy did not show cancer.  Path showed non necrotizing granulatous inflammation . ACE 91  She took 20 mg prednisone for 2 weeks in 12/2015 then dropped to 10 mg By 01/2016 she developed increasing pedal edema, ascites and had to be hospitalized-required IV diuretics lost about 9-10 pounds and is now maintained on Lasix and Aldactone He was noted to have a high meld score and is being referred/evaluated for transplant  Remains on Dulera Twice daily  , zyrtec daily .    She continues to complain of dyspnea She is due to see an endocrinologist for low cortisol level   Significant tests/ events   PFTs 07/2011-FEV1 63%, moderate airway obstruction, no bronchodilator response, nml TLC, DLCO 77%   CT sinuses clear CT chest 08/2015 >>Innumerable (> 40) subcentimeter solid pulmonary nodules throughout both lungs, largest 7 mm in the right upper lobe, generally centrilobular in distribution, all of which have mildly increased in size since 06/28/2013 Cirrhosis with varices, new liver lesion  MRI liver 10/2015 >> numerous siderotic nodules  Past Medical History  Diagnosis Date  . Anemia   . Asthma   . Allergy   . Hepatic fibrosis ([place])      Review of Systems neg  for any significant sore throat, dysphagia, itching, sneezing, nasal congestion or excess/ purulent secretions, fever, chills, sweats, unintended wt loss, pleuritic or exertional cp, hempoptysis, orthopnea pnd or change in chronic leg swelling. Also denies presyncope, palpitations, heartburn, abdominal pain, nausea, vomiting, diarrhea or change in bowel or urinary habits, dysuria,hematuria, rash, arthralgias, visual complaints, headache, numbness weakness or ataxia.     Objective:   Physical Exam   Gen. Pleasant, well-nourished, in no distress ENT - no lesions, no post nasal drip Neck: No JVD, no thyromegaly, no carotid bruits Lungs: no use of accessory muscles, no dullness to percussion, clear without rales or rhonchi  Cardiovascular: Rhythm regular, heart sounds  normal, no murmurs or gallops, no peripheral edema Musculoskeletal: No deformities, no cyanosis or clubbing         Assessment & Plan:

## 2016-03-12 NOTE — Assessment & Plan Note (Signed)
Stay on Dulera twice daily

## 2016-03-20 ENCOUNTER — Ambulatory Visit (HOSPITAL_COMMUNITY)
Admission: RE | Admit: 2016-03-20 | Discharge: 2016-03-20 | Disposition: A | Discharge status: Home / Self Care | Payer: BLUE CROSS/BLUE SHIELD | Source: Ambulatory Visit | Attending: Pulmonary Disease | Admitting: Pulmonary Disease

## 2016-03-20 DIAGNOSIS — D869 Sarcoidosis, unspecified: Secondary | ICD-10-CM | POA: Insufficient documentation

## 2016-03-20 LAB — PULMONARY FUNCTION TEST
DL/VA % PRED: 97 %
DL/VA: 4.55 ml/min/mmHg/L
DLCO unc % pred: 80 %
DLCO unc: 18.5 ml/min/mmHg
FEF 25-75 POST: 1.03 L/s
FEF 25-75 Pre: 0.65 L/sec
FEF2575-%Change-Post: 58 %
FEF2575-%Pred-Post: 42 %
FEF2575-%Pred-Pre: 27 %
FEV1-%CHANGE-POST: 17 %
FEV1-%PRED-PRE: 51 %
FEV1-%Pred-Post: 59 %
FEV1-PRE: 1.29 L
FEV1-Post: 1.52 L
FEV1FVC-%Change-Post: 3 %
FEV1FVC-%PRED-PRE: 73 %
FEV6-%Change-Post: 13 %
FEV6-%PRED-PRE: 69 %
FEV6-%Pred-Post: 79 %
FEV6-POST: 2.49 L
FEV6-PRE: 2.2 L
FEV6FVC-%Change-Post: 0 %
FEV6FVC-%PRED-POST: 101 %
FEV6FVC-%PRED-PRE: 101 %
FVC-%Change-Post: 13 %
FVC-%PRED-POST: 77 %
FVC-%PRED-PRE: 68 %
FVC-POST: 2.53 L
FVC-PRE: 2.23 L
PRE FEV6/FVC RATIO: 98 %
Post FEV1/FVC ratio: 60 %
Post FEV6/FVC ratio: 99 %
Pre FEV1/FVC ratio: 58 %
RV % pred: 293 %
RV: 5.52 L
TLC % PRED: 174 %
TLC: 8.53 L

## 2016-03-20 MED ORDER — ALBUTEROL SULFATE (2.5 MG/3ML) 0.083% IN NEBU
2.5000 mg | INHALATION_SOLUTION | Freq: Once | RESPIRATORY_TRACT | Status: AC
  Administered 2016-03-20: 2.5 mg via RESPIRATORY_TRACT

## 2016-03-22 ENCOUNTER — Ambulatory Visit (HOSPITAL_BASED_OUTPATIENT_CLINIC_OR_DEPARTMENT_OTHER): Payer: BLUE CROSS/BLUE SHIELD

## 2016-03-25 ENCOUNTER — Telehealth: Payer: Self-pay | Admitting: Pulmonary Disease

## 2016-03-26 NOTE — Telephone Encounter (Signed)
Patient notified of test results. Nothing further needed.

## 2016-04-03 ENCOUNTER — Telehealth: Payer: Self-pay | Admitting: Pulmonary Disease

## 2016-04-03 NOTE — Telephone Encounter (Signed)
This patient called and cancelled her CT on 06/23 stating that she didn't need to do the CT now. When would you like for me to try and reschedule the CT or is it still needed?

## 2016-04-04 NOTE — Telephone Encounter (Signed)
LVM for pt to return call

## 2016-04-04 NOTE — Telephone Encounter (Signed)
lmtcb x2 

## 2016-04-04 NOTE — Telephone Encounter (Signed)
lmtcb x1 for pt. 

## 2016-04-04 NOTE — Telephone Encounter (Signed)
Patient returned call, CB [phone].

## 2016-04-04 NOTE — Telephone Encounter (Signed)
Can discuss on FU visit

## 2016-04-04 NOTE — Telephone Encounter (Signed)
Patient returned call, CB [phone]

## 2016-04-08 ENCOUNTER — Other Ambulatory Visit: Payer: Self-pay | Admitting: Pulmonary Disease

## 2016-04-25 ENCOUNTER — Encounter: Payer: Self-pay | Admitting: Pulmonary Disease

## 2016-04-25 ENCOUNTER — Ambulatory Visit (INDEPENDENT_AMBULATORY_CARE_PROVIDER_SITE_OTHER): Payer: BLUE CROSS/BLUE SHIELD | Admitting: Pulmonary Disease

## 2016-04-25 DIAGNOSIS — J4550 Severe persistent asthma, uncomplicated: Secondary | ICD-10-CM

## 2016-04-25 DIAGNOSIS — D869 Sarcoidosis, unspecified: Secondary | ICD-10-CM | POA: Diagnosis not present

## 2016-04-25 NOTE — Assessment & Plan Note (Signed)
Unfortunately she has not responded much to prednisone clinically in terms of dyspnea or cough  Drop prednisone to 5mg  M/W/F x 2 weeks then STOP Stay on dulera & albuterol

## 2016-04-25 NOTE — Assessment & Plan Note (Signed)
She did have a bronchodilator response on the second pulmonary function testing-hence continue Dulera and albuterol

## 2016-04-25 NOTE — Progress Notes (Signed)
   Subjective:    Patient ID: Alicia [name], female    DOB: [birth year], 55 y.o.   MRN: [id number]  HPI   55 yo never smoker with severe persistent asthma, vocal cord polyp & multiple Pulmonary nodules first noted 06/2011 .  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 She is on lasix  and aldactone daily  (Dr. Ester Rink)   04/25/2016  Chief Complaint  Patient presents with  . Sarcoidosis    has not seen any difference taking the Prednisone, very short of breath.       She took 20 mg prednisone for 2 weeks in 12/2015 then dropped to 10 mg By 01/2016 she developed increasing pedal edema, ascites and had to be hospitalized-required IV diuretics lost about 9-10 pounds and is now maintained on Lasix and Aldactone Based on decreased lung function she was restarted on prednisone trial again-but her dyspnea is unchanged after 4 weeks  Remains on Dulera Twice daily  , zyrtec daily .       Significant tests/ events   PFTs 07/2011-FEV1 63%, moderate airway obstruction, no bronchodilator response, nml TLC, DLCO 77%  PFTs 02/2016 >>moderate airway obstruction that improved with albuterol 17%  - FEV1 51% to 59% DLCO 80%   CT sinuses clear CT chest 08/2015 >>Innumerable (> 40) subcentimeter solid pulmonary nodules throughout both lungs, largest 7 mm in the right upper lobe, generally centrilobular in distribution, all of which have mildly increased in size since 06/28/2013 Cirrhosis with varices, new liver lesion  MRI liver 10/2015 >> numerous siderotic nodules  FOB on 12/08/15 , showed nml upper airway , nml Vocal cord motion. No endobronchial lesions. Negative for infection and biopsy did not show cancer.  Path showed non necrotizing granulatous inflammation  ACE 91     Review of Systems Patient denies significant cough, hemoptysis,  chest pain, palpitations,  pedal edema, orthopnea, paroxysmal nocturnal dyspnea, lightheadedness, nausea, vomiting, abdominal or  leg pains      Objective:   Physical Exam Gen. Pleasant, well-nourished, in no distress ENT - no lesions, no post nasal drip Neck: No JVD, no thyromegaly, no carotid bruits Lungs: no use of accessory muscles, no dullness to percussion, clear without rales or rhonchi  Cardiovascular: Rhythm regular, heart sounds  normal, no murmurs or gallops, no peripheral edema Musculoskeletal: No deformities, no cyanosis or clubbing         Assessment & Plan:

## 2016-04-25 NOTE — Patient Instructions (Signed)
Drop prednisone to 5mg  M/W/F x 2 weeks then STOP Stay on dulera & albuterol

## 2016-05-29 ENCOUNTER — Emergency Department (HOSPITAL_BASED_OUTPATIENT_CLINIC_OR_DEPARTMENT_OTHER): Payer: BLUE CROSS/BLUE SHIELD

## 2016-05-29 ENCOUNTER — Emergency Department (HOSPITAL_BASED_OUTPATIENT_CLINIC_OR_DEPARTMENT_OTHER)
Admission: EM | Admit: 2016-05-29 | Discharge: 2016-05-29 | Disposition: A | Discharge status: Home / Self Care | Payer: BLUE CROSS/BLUE SHIELD | Attending: Emergency Medicine | Admitting: Emergency Medicine

## 2016-05-29 ENCOUNTER — Encounter (HOSPITAL_BASED_OUTPATIENT_CLINIC_OR_DEPARTMENT_OTHER): Payer: Self-pay | Admitting: *Deleted

## 2016-05-29 DIAGNOSIS — D696 Thrombocytopenia, unspecified: Secondary | ICD-10-CM | POA: Diagnosis not present

## 2016-05-29 DIAGNOSIS — R062 Wheezing: Secondary | ICD-10-CM | POA: Diagnosis not present

## 2016-05-29 DIAGNOSIS — K7460 Unspecified cirrhosis of liver: Secondary | ICD-10-CM | POA: Insufficient documentation

## 2016-05-29 DIAGNOSIS — K766 Portal hypertension: Secondary | ICD-10-CM | POA: Diagnosis not present

## 2016-05-29 DIAGNOSIS — J449 Chronic obstructive pulmonary disease, unspecified: Secondary | ICD-10-CM | POA: Diagnosis not present

## 2016-05-29 DIAGNOSIS — R109 Unspecified abdominal pain: Secondary | ICD-10-CM

## 2016-05-29 HISTORY — DX: Sarcoidosis, unspecified: D86.9

## 2016-05-29 LAB — URINALYSIS, ROUTINE W REFLEX MICROSCOPIC
BILIRUBIN URINE: NEGATIVE
Glucose, UA: 100 mg/dL — AB
HGB URINE DIPSTICK: NEGATIVE
KETONES UR: NEGATIVE mg/dL
Leukocytes, UA: NEGATIVE
Nitrite: NEGATIVE
PH: 6.5 (ref 5.0–8.0)
Protein, ur: NEGATIVE mg/dL
SPECIFIC GRAVITY, URINE: 1.009 (ref 1.005–1.030)

## 2016-05-29 LAB — COMPREHENSIVE METABOLIC PANEL
ALK PHOS: 122 U/L (ref 38–126)
ALT: 62 U/L — AB (ref 14–54)
AST: 63 U/L — AB (ref 15–41)
Albumin: 3.1 g/dL — ABNORMAL LOW (ref 3.5–5.0)
Anion gap: 6 (ref 5–15)
BUN: 13 mg/dL (ref 6–20)
CALCIUM: 8.6 mg/dL — AB (ref 8.9–10.3)
CHLORIDE: 104 mmol/L (ref 101–111)
CO2: 23 mmol/L (ref 22–32)
CREATININE: 1.23 mg/dL — AB (ref 0.44–1.00)
GFR calc Af Amer: 55 mL/min — ABNORMAL LOW (ref >60)
GFR calc non Af Amer: 48 mL/min — ABNORMAL LOW (ref >60)
GLUCOSE: 87 mg/dL (ref 65–99)
Potassium: 4.2 mmol/L (ref 3.5–5.1)
SODIUM: 133 mmol/L — AB (ref 135–145)
Total Bilirubin: 2.4 mg/dL — ABNORMAL HIGH (ref 0.3–1.2)
Total Protein: 5.9 g/dL — ABNORMAL LOW (ref 6.5–8.1)

## 2016-05-29 LAB — CBC
HCT: 36 % (ref 36.0–46.0)
HEMOGLOBIN: 12.8 g/dL (ref 12.0–15.0)
MCH: 35.6 pg — ABNORMAL HIGH (ref 26.0–34.0)
MCHC: 35.6 g/dL (ref 30.0–36.0)
MCV: 100 fL (ref 78.0–100.0)
PLATELETS: 72 10*3/uL — AB (ref 150–400)
RBC: 3.6 MIL/uL — AB (ref 3.87–5.11)
RDW: 14.6 % (ref 11.5–15.5)
WBC: 8.1 10*3/uL (ref 4.0–10.5)

## 2016-05-29 LAB — BRAIN NATRIURETIC PEPTIDE: B Natriuretic Peptide: 28.9 pg/mL (ref 0.0–100.0)

## 2016-05-29 LAB — PREGNANCY, URINE: Preg Test, Ur: NEGATIVE

## 2016-05-29 LAB — PROTIME-INR
INR: 1.17
Prothrombin Time: 15 seconds (ref 11.4–15.2)

## 2016-05-29 LAB — AMMONIA: Ammonia: 60 umol/L — ABNORMAL HIGH (ref 9–35)

## 2016-05-29 LAB — I-STAT CG4 LACTIC ACID, ED: Lactic Acid, Venous: 1.51 mmol/L (ref 0.5–1.9)

## 2016-05-29 MED ORDER — MORPHINE SULFATE (PF) 4 MG/ML IV SOLN: 4.0000 mg | Freq: Once | INTRAVENOUS | Status: DC

## 2016-05-29 MED ORDER — LACTULOSE 20 GM/30ML PO SOLN: 15.0000 g | Freq: Two times a day (BID) | ORAL | 0 refills | Status: DC

## 2016-05-29 MED ORDER — FUROSEMIDE 10 MG/ML IJ SOLN
80.0000 mg | Freq: Once | INTRAMUSCULAR | Status: AC
  Administered 2016-05-29: 80 mg via INTRAVENOUS
  Filled 2016-05-29: qty 8

## 2016-05-29 MED ORDER — MORPHINE SULFATE (PF) 2 MG/ML IV SOLN: 2.0000 mg | Freq: Once | INTRAVENOUS | Status: DC

## 2016-05-29 MED ORDER — IOPAMIDOL (ISOVUE-300) INJECTION 61%
80.0000 mL | Freq: Once | INTRAVENOUS | Status: AC | PRN
Start: 2016-05-29 — End: 2016-05-29
  Administered 2016-05-29: 80 mL via INTRAVENOUS

## 2016-05-29 NOTE — ED Triage Notes (Signed)
Pt c/o abd pain x 6 hrs denies n/v

## 2016-05-29 NOTE — ED Notes (Signed)
Patient returned from CT with tech via stretcher 

## 2016-05-29 NOTE — ED Provider Notes (Signed)
[place] DEPT [facility] Provider Note   CSN: [id number] Arrival date & time: 05/29/16  1619   By signing my name below, I, Shanna Cisco, attest that this documentation has been prepared under the direction and in the presence of Shary Decamp, PA-C. Electronically signed by: Shanna Cisco, ED Scribe. 05/29/16. 5:09 PM.   History   Chief Complaint Chief Complaint  Patient presents with  . Abdominal Pain   The history is provided by the patient. No language interpreter was used.   HPI Comments:  Alicia [name] is a 55 y.o. female with a history of congenital hepatic fibrosis and HTN who presents to the Emergency Department complaining of moderate abdominal pain, which started 6 hours ago. Pain localized throughout entire abdomen and is characterized as a waxing and waning "twisting" pain. Associated symptoms include pitting edema and gradually worsening leg cramps. She reports that she's experienced similar symptoms in early June of this year. Denies chest pain or abnormal SOB. Pt has gallstones, but has declined surgery due to varices. No hematochezia. No hematemesis. No other symptoms noted.   Past Medical History:  Diagnosis Date  . Allergy   . Anemia   . Asthma   . Hepatic fibrosis ([place])   . Sarcoidosis [facility])     Patient Active Problem List   Diagnosis Date Noted  . Multiple pulmonary nodules determined by computed tomography of lung   . Chronic cough 09/07/2015  . Hypertension, portal ([place]) 09/04/2011  . Asthma with chronic obstructive pulmonary disease (COPD) ([place]) 09/04/2011  . 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 GERD (gastroesophageal reflux disease) 07/04/2011  . Sarcoid ([place]) 07/01/2011  . Asthma, severe persistent, with upper airway instability 06/13/2011  . 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 SPINAL FUSION  12/2007   Double spinal Fusion  . VIDEO BRONCHOSCOPY Bilateral 12/08/2015   Procedure: VIDEO BRONCHOSCOPY  WITH FLUORO;  Surgeon: Rigoberto Noel, MD;  Location: [place];  Service: Cardiopulmonary;  Laterality: Bilateral;    OB History    No data available       Home Medications    Prior to Admission medications   Medication Sig Start Date End Date Taking? Authorizing Provider  benzonatate (TESSALON) 100 MG capsule TAKE 1 CAPSULE (100 MG TOTAL) BY MOUTH EVERY 8 (EIGHT) HOURS AS NEEDED. 04/09/16   Rigoberto Noel, MD  furosemide (LASIX) 80 MG tablet Take 80 mg by mouth daily.    Historical Provider, MD  KLOR-CON M20 20 MEQ tablet Take 1 tablet by mouth daily. Reported on 11/09/2015 09/01/14   Historical Provider, MD  mometasone-formoterol (DULERA) 200-5 MCG/ACT AERO INHALE 2 PUFFS BY MOUTH TWO TIMES DAILY. 01/09/16   Rigoberto Noel, MD  pantoprazole (PROTONIX) 40 MG tablet Take 1 tablet by mouth 2 (two) times daily.  04/05/11   Historical Provider, MD  PROAIR HFA 108 (90 Base) MCG/ACT inhaler INHALE 2 PUFFS EVERY FOUR (4) HOURS AS NEEDED FOR WHEEZING. 11/10/15   Rigoberto Noel, MD  ranitidine (ZANTAC) 300 MG capsule Take 1 capsule (300 mg total) by mouth at bedtime. 12/19/15   Rigoberto Noel, MD  Spacer/Aero-Holding Chambers (AEROCHAMBER MV) inhaler Use as instructed 09/08/14   Elsie Stain, MD  spironolactone (ALDACTONE) 100 MG tablet Take 200 mg by mouth daily.    Historical Provider, MD    Family History Family History  Problem Relation Age of Onset  . Arthritis Mother   . Heart disease Father  Triple bypass  . Heart attack Paternal Grandmother   . Stroke Paternal Grandfather   . Liver disease Sister     Juvenile cysitc liver disease  . Asthma Sister     Social History Social History  Substance Use Topics  . Smoking status: Never Smoker  . Smokeless tobacco: Never Used  . Alcohol use Yes     Comment: wine nightly   Allergies   Aspirin; Dairy aid [lactase]; Eggs or egg-derived products; Azithromycin; Chocolate; Corn oil; Gabapentin; Peanut-containing drug products; and  Wheat   Review of Systems Review of Systems  Respiratory: Negative for shortness of breath.   Cardiovascular: Positive for leg swelling. Negative for chest pain.  Gastrointestinal: Positive for abdominal pain.  All other systems reviewed and are negative.  Physical Exam Updated Vital Signs BP 125/88 (BP Location: Right Arm)   Pulse 85   Temp 99 F (37.2 C) (Oral)   Resp 20   Ht 5\' 3"  (1.6 m)   Wt 156 lb 4 oz (70.9 kg)   SpO2 99%   BMI 27.68 kg/m   Physical Exam  Constitutional: She is oriented to person, place, and time. She appears well-developed and well-nourished.  HENT:  Head: Normocephalic and atraumatic.  Mouth/Throat: Oropharynx is clear and moist.  Eyes: Conjunctivae and EOM are normal. Pupils are equal, round, and reactive to light.  Neck: Normal range of motion.  Cardiovascular: Normal rate, regular rhythm and normal heart sounds.   Pulmonary/Chest: Effort normal and breath sounds normal.  Abdominal: Soft. Normal appearance and bowel sounds are normal. There is no tenderness. There is no rigidity, no rebound, no guarding, no CVA tenderness, no tenderness at McBurney's point and negative Murphy's sign.  Abdomen distended. No Ascites.   Musculoskeletal: Normal range of motion. She exhibits edema ( 3+ bilaterally).  Edema extends to proximal tibia  Neurological: She is alert and oriented to person, place, and time.  Skin: Skin is warm and dry.  Psychiatric: She has a normal mood and affect.  Nursing note and vitals reviewed.   ED Treatments / Results  DIAGNOSTIC STUDIES:  Oxygen Saturation is 99% on room air, normal by my interpretation.    COORDINATION OF CARE:  5:05 PM Discussed treatment plan with pt at bedside, which includes pain medication, and pt agreed to plan.  Labs (all labs ordered are listed, but only abnormal results are displayed) Labs Reviewed  CBC - Abnormal; Notable for the following:       Result Value   RBC 3.60 (*)    MCH 35.6 (*)     Platelets 72 (*)    All other components within normal limits  COMPREHENSIVE METABOLIC PANEL - Abnormal; Notable for the following:    Sodium 133 (*)    Creatinine, Ser 1.23 (*)    Calcium 8.6 (*)    Total Protein 5.9 (*)    Albumin 3.1 (*)    AST 63 (*)    ALT 62 (*)    Total Bilirubin 2.4 (*)    GFR calc non Af Amer 48 (*)    GFR calc Af Amer 55 (*)    All other components within normal limits  URINALYSIS, ROUTINE W REFLEX MICROSCOPIC (NOT AT [facility]) - Abnormal; Notable for the following:    Glucose, UA 100 (*)    All other components within normal limits  AMMONIA - Abnormal; Notable for the following:    Ammonia 60 (*)    All other components within normal limits  PREGNANCY, URINE  PROTIME-INR  BRAIN NATRIURETIC PEPTIDE  LACTIC ACID, PLASMA  I-STAT CG4 LACTIC ACID, ED    EKG  EKG Interpretation None      Radiology Dg Chest 2 View  Result Date: 05/29/2016 CLINICAL DATA:  Upper abdominal pain, shortness of breath EXAM: CHEST  2 VIEW COMPARISON:  12/08/2015 FINDINGS: The heart size and mediastinal contours are within normal limits. Both lungs are clear. The visualized skeletal structures are unremarkable. IMPRESSION: No active cardiopulmonary disease. Electronically Signed   By: Kathreen Devoid   On: 05/29/2016 18:47   Ct Abdomen Pelvis W Contrast  Addendum Date: 05/29/2016   ADDENDUM REPORT: 05/29/2016 19:23 ADDENDUM: Tiny hypodensities towards the dome of liver have a benign appearance. Electronically Signed   By: Marybelle Killings M.D.   On: 05/29/2016 19:23   Result Date: 05/29/2016 CLINICAL DATA:  Abdominal pain today EXAM: CT ABDOMEN AND PELVIS WITH CONTRAST TECHNIQUE: Multidetector CT imaging of the abdomen and pelvis was performed using the standard protocol following bolus administration of intravenous contrast. CONTRAST:  57mL ISOVUE-300 IOPAMIDOL (ISOVUE-300) INJECTION 61% COMPARISON:  CT chest 09/18/2015 FINDINGS: Lung bases are clear Contour of the liver is nodular.  The left lobe is prominent. Tiny hypodensities towards the dome of the liver are nonspecific. They measure 5 mm or less in size. The portal vein is severely diminutive. This may be result of chronic occlusion and some re- cannulization. There are massive gastric and gastroesophageal varices. 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 The left renal vein is dilated. The spleen is enlarged measuring 14.8 cm in vertical dimension. Pancreas is unremarkable. Adrenal glands are within normal limits Kidneys are within normal limits. The colon is distended with air-fluid levels. Moderate stool burden throughout the descending and sigmoid colon. No obvious focal mass. There are nonspecific air-fluid levels within small bowel loops which are nondistended. The bladder is moderately distended. Uterus and adnexa are within normal limits No free fluid.  No abnormal retroperitoneal adenopathy. Along the lower pericolic gutter, there are irregular small soft tissue linear areas. There is no evidence of omental caking. This finding is of unknown significance. Superior L4 endplate Schmorl's node is associated with a compression deformity. This involves the right side of the superior endplate. L3 in May angioma. IMPRESSION: Cirrhotic liver associated with massive gastric and gastroesophageal varices Splenomegaly There is distention of both small and large bowel. There is moderate stool burden of the distal colon. No obvious mass is present to suggest obstruction. Bladder is distended. There is soft tissue stranding along the right pericolic gutter of unknown significance. These areas may represent small varices. There is no other finding to suggest omental caking or peritoneal carcinomatosis. L4 superior endplate fracture of indeterminate age. Electronically Signed: By: Marybelle Killings M.D. On: 05/29/2016 19:06    Procedures Procedures (including critical care time)  Medications Ordered in ED Medications - No data  to display   Initial Impression / Assessment and Plan / ED Course  I have reviewed the triage vital signs and the nursing notes.  Pertinent labs & imaging results that were available during my care of the patient were reviewed by me and considered in my medical decision making (see chart for details).  Clinical Course    Final Clinical Impressions(s) / ED Diagnoses  I have reviewed and evaluated the relevant laboratory valuesI have reviewed and evaluated the relevant imaging studies. I have reviewed the relevant previous healthcare records.I obtained HPI from historian. Patient discussed with supervising physician  ED  Course:  Assessment: Pt is a 12yF with hx history of congenital hepatic fibrosis and HTN who presents with abdominal pain with onset x 6 hours ago. No N/V. Hx same and admitted to [facility] for IV diuretics. Pt states from portal HTN. On exam, pt in NAD. Nontoxic/nonseptic appearing. VSS. Afebrile. Lungs CTA. Heart RRR. Abdomen nontender soft. 3+ pitting edema noted on BLE up to knees. CBC unremarkable. Noted thrombocytopenia, but this is baseline. CMP with baseline labs from [facility]. UA unremarkable. Lactae negative. Ammonia 60. CXR unremarkable. CT ABD/Pelvis with Cirrohtic liveer with massive gastric and gastroesophageal varices. No hematemesis. No hematochezia. No free fluid. No SBO. Given analgesia in ED as well as IV lasix due to pitting edema. Spoke with GI at [facility]. No indication for admission. Will Rx Lactulose. GI will send message to Dr. Gertie Fey for follow up sooner.Plan is to [place] with follow up to GI. At time of discharge, Patient is in no acute distress. Vital Signs are stable. Patient is able to ambulate. Patient able to tolerate PO.    Disposition/Plan:  DC Home Additional Verbal discharge instructions given and discussed with patient.  Pt Instructed to f/u with PCP in the next week for evaluation and treatment of symptoms. Return precautions given Pt  acknowledges and agrees with plan  Supervising Physician Sherwood Gambler, MD   Final diagnoses:  Abdominal pain  Portal hypertension ([place])  Cirrhosis of liver without ascites, unspecified hepatic cirrhosis type ([place])  Thrombocytopenia ([place])    New Prescriptions New Prescriptions   No medications on file  I personally performed the services described in this documentation, which was scribed in my presence. The recorded information has been reviewed and is accurate.     Shary Decamp, PA-C 05/29/16 2140    Sherwood Gambler, MD 06/01/16 [phone]

## 2016-05-29 NOTE — ED Notes (Signed)
Patient resting at this time, patient placed back on monitor after CT.

## 2016-05-29 NOTE — ED Notes (Signed)
Patient drinking PO contrast given by radiology

## 2016-05-29 NOTE — Discharge Instructions (Signed)
Please read and follow all provided instructions.  Your diagnoses today include:  1. Portal hypertension (HCC)   2. Abdominal pain   3. Cirrhosis of liver without ascites, unspecified hepatic cirrhosis type ([place])   4. Thrombocytopenia ([place])     Tests performed today include: Vital signs. See below for your results today.   Medications prescribed:  Take as prescribed   Home care instructions:  Follow any educational materials contained in this packet.  Follow-up instructions: Please follow-up with your gastroenterologist for further evaluation of symptoms and treatment. They will call you.    Return instructions:  Please return to the Emergency Department if you do not get better, if you get worse, or new symptoms OR  - Fever (temperature greater than 101.61F)  - Bleeding that does not stop with holding pressure to the area    -Severe pain (please note that you may be more sore the day after your accident)  - Chest Pain  - Difficulty breathing  - Severe nausea or vomiting  - Inability to tolerate food and liquids  - Passing out  - Skin becoming red around your wounds  - Change in mental status (confusion or lethargy)  - New numbness or weakness    Please return if you have any other emergent concerns.  Additional Information:  Your vital signs today were: BP 112/65    Pulse 83    Temp 99 F (37.2 C) (Oral)    Resp 19    Ht 5\' 3"  (1.6 m)    Wt 70.9 kg    SpO2 96%    BMI 27.68 kg/m  If your blood pressure (BP) was elevated above 135/85 this visit, please have this repeated by your doctor within one month. ---------------

## 2016-06-05 ENCOUNTER — Ambulatory Visit (INDEPENDENT_AMBULATORY_CARE_PROVIDER_SITE_OTHER): Payer: BLUE CROSS/BLUE SHIELD | Admitting: Family Medicine

## 2016-06-05 ENCOUNTER — Encounter: Payer: Self-pay | Admitting: Family Medicine

## 2016-06-05 DIAGNOSIS — R17 Unspecified jaundice: Secondary | ICD-10-CM | POA: Diagnosis not present

## 2016-06-05 LAB — CBC
HEMATOCRIT: 36.9 % (ref 36.0–46.0)
Hemoglobin: 12.9 g/dL (ref 12.0–15.0)
MCHC: 34.9 g/dL (ref 30.0–36.0)
MCV: 101.3 fl — AB (ref 78.0–100.0)
PLATELETS: 77 10*3/uL — AB (ref 150.0–400.0)
RBC: 3.65 Mil/uL — AB (ref 3.87–5.11)
RDW: 14.5 % (ref 11.5–15.5)
WBC: 7 10*3/uL (ref 4.0–10.5)

## 2016-06-05 LAB — COMPREHENSIVE METABOLIC PANEL
ALT: 54 U/L — ABNORMAL HIGH (ref 0–35)
AST: 43 U/L — ABNORMAL HIGH (ref 0–37)
Albumin: 3.2 g/dL — ABNORMAL LOW (ref 3.5–5.2)
Alkaline Phosphatase: 131 U/L — ABNORMAL HIGH (ref 39–117)
BUN: 12 mg/dL (ref 6–23)
CALCIUM: 8.5 mg/dL (ref 8.4–10.5)
CO2: 26 meq/L (ref 19–32)
Chloride: 100 mEq/L (ref 96–112)
Creatinine, Ser: 1.16 mg/dL (ref 0.40–1.20)
GFR: 51.08 mL/min — AB (ref >60.00)
GLUCOSE: 231 mg/dL — AB (ref 70–99)
POTASSIUM: 4.3 meq/L (ref 3.5–5.1)
Sodium: 131 mEq/L — ABNORMAL LOW (ref 135–145)
Total Bilirubin: 2 mg/dL — ABNORMAL HIGH (ref 0.2–1.2)
Total Protein: 5.7 g/dL — ABNORMAL LOW (ref 6.0–8.3)

## 2016-06-05 LAB — AMMONIA: Ammonia: 74 umol/L — ABNORMAL HIGH (ref 11–35)

## 2016-06-05 NOTE — Patient Instructions (Addendum)
It was very nice seeing you today.  We will check a blood count, CMP and ammonia for you today I am certainly glad to do any labs that are needed by your speciality providers- just let me know If you would, set up mychart for ease of communication Take care and I'll be in touch with your results asap

## 2016-06-05 NOTE — Progress Notes (Signed)
Pre visit review using our clinic review tool, if applicable. No additional management support is needed unless otherwise documented below in the visit note. 

## 2016-06-05 NOTE — Progress Notes (Addendum)
[place] at [facility] [address], [place], [place] [address] [phone]  Date:  06/05/2016   Name:  Alicia [name]   DOB:  1961/06/04   MRN:  [id number]  PCP:  Alicia Blinks, MD    Chief Complaint: Establish Care (Pt here to est care. )   History of Present Illness:  Alicia [name] is a 54 y.o. very pleasant female patient who presents with the following:  Here today to re-establish primary care.  She did see my partner Dr. Etter Sjogren several years ago,but since then has just beeing seeing her specialists  Her pulmonologist is Dr. 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   She got pretty sick in June of this year- she was admitted to [facility] for several days. "I had put on 25 lbs in 2 days. They had to give me lactulose to help get rid of my toxins."  She is taking spiro 200 and lasix 80 daily.  She will get leg cramps if she gets dehydrated and has to carefully manage her gluids She went to the ER about one week ago for fluid retention and illness, but not as severe this time- she was treated with IV lasix and lacturlose again  She is still taking lactulose BID- her ammonia was at 60 at the ER and she would like to check this level and her lytes, liver function today.  She notes that her platelets are generally around 70K  She has asthma, COPD, and sarcoid as well.  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  She is married, they were never able to have children.    Wt Readings from Last 3 Encounters:  06/05/16 155 lb (70.3 kg)  05/29/16 156 lb 4 oz (70.9 kg)  04/25/16 157 lb (71.2 kg)   BP Readings from Last 3 Encounters:  06/05/16 122/70  05/29/16 188/99  04/25/16 110/73     Patient Active Problem List   Diagnosis Date Noted  . Multiple  pulmonary nodules determined by computed tomography of lung   . Chronic cough 09/07/2015  . Hypertension, portal ([place]) 09/04/2011  . Asthma with chronic obstructive pulmonary disease (COPD) ([place]) 09/04/2011  . 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 GERD (gastroesophageal reflux disease) 07/04/2011  . Sarcoid ([place]) 07/01/2011  . Asthma, severe persistent, with upper airway instability 06/13/2011  . 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 Allergy   . Anemia   . Asthma   . Hepatic fibrosis ([place])   . Sarcoidosis [facility])     Past Surgical History:  Procedure Laterality Date  . SPINAL FUSION  12/2007   Double spinal Fusion  . VIDEO BRONCHOSCOPY Bilateral 12/08/2015   Procedure: VIDEO BRONCHOSCOPY WITH FLUORO;  Surgeon: Rigoberto Noel, MD;  Location: [place];  Service: Cardiopulmonary;  Laterality: Bilateral;    Social History  Substance Use Topics  . Smoking status: Never Smoker  . Smokeless tobacco: Never Used  . Alcohol use Yes     Comment: wine nightly    Family History  Problem Relation Age of Onset  . Arthritis Mother   . Heart disease Father     Triple bypass  . Heart attack Paternal Grandmother   . Stroke Paternal Grandfather   .  Liver disease Sister     Juvenile cysitc liver disease  . Asthma Sister     Allergies  Allergen Reactions  . Aspirin Other (See Comments)    Causes Asthma  . Dairy Aid [Lactase]     Causes Asthma attacks  . Eggs Or Egg-Derived Products     Causes Asthma  . Azithromycin Nausea And Vomiting  . Chocolate     Asthma attack, hives  . Corn Oil     Other reaction(s): Other (See Comments) Unknown  . Gabapentin Other (See Comments)    Blisters in mouth  . Peanut-Containing Drug Products     Asthma, eye swelling, itchy eyes, can't catch her breath  . Wheat     Asthma, hives    Medication list has been reviewed and updated.  Current Outpatient Prescriptions on File Prior to Visit  Medication  Sig Dispense Refill  . benzonatate (TESSALON) 100 MG capsule TAKE 1 CAPSULE (100 MG TOTAL) BY MOUTH EVERY 8 (EIGHT) HOURS AS NEEDED. 30 capsule 3  . furosemide (LASIX) 80 MG tablet Take 80 mg by mouth daily.    Marland Kitchen KLOR-CON M20 20 MEQ tablet Take 1 tablet by mouth daily. Reported on 11/09/2015  10  . Lactulose 20 GM/30ML SOLN Take 22.5 mLs (15 g total) by mouth 2 (two) times daily. 500 mL 0  . mometasone-formoterol (DULERA) 200-5 MCG/ACT AERO INHALE 2 PUFFS BY MOUTH TWO TIMES DAILY. 1 Inhaler 5  . pantoprazole (PROTONIX) 40 MG tablet Take 1 tablet by mouth 2 (two) times daily.     Marland Kitchen PROAIR HFA 108 (90 Base) MCG/ACT inhaler INHALE 2 PUFFS EVERY FOUR (4) HOURS AS NEEDED FOR WHEEZING. 8.5 Inhaler 0  . ranitidine (ZANTAC) 300 MG capsule Take 1 capsule (300 mg total) by mouth at bedtime. 90 capsule 1  . Spacer/Aero-Holding Chambers (AEROCHAMBER MV) inhaler Use as instructed 1 each 0  . spironolactone (ALDACTONE) 100 MG tablet Take 200 mg by mouth daily.     No current facility-administered medications on file prior to visit.     Review of Systems: No fever or chills,  No belly pain, no rash As per HPI- otherwise negative.   Physical Examination: Vitals:   06/05/16 1419  BP: 122/70  Pulse: 92  Temp: 98.5 F (36.9 C)   Vitals:   06/05/16 1419  Weight: 155 lb (70.3 kg)  Height: 5\' 3"  (1.6 m)   Body mass index is 27.46 kg/m. Ideal Body Weight: Weight in (lb) to have BMI = 25: 140.8  GEN: WDWN, NAD, Non-toxic, A & O x 3, looks well, mild overweight.  She is articulate and alert- certainly no evidence of encephalopathy at this time HEENT: Atraumatic, Normocephalic. Neck supple. No masses, No LAD.  Bilateral TM wnl, oropharynx normal.  PEERL,EOMI.   Minimal scleral icterus  Ears and Nose: No external deformity. CV: RRR, No M/G/R. No JVD. No thrill. No extra heart sounds. PULM: CTA B, no wheezes, crackles, rhonchi. No retractions. No resp. distress. No accessory muscle use. ABD: S, NT, ND,  +BS. No rebound. She has both hepato and splenomegaly on exam EXTR: No c/c/e NEURO Normal gait.  PSYCH: Normally interactive. Conversant. Not depressed or anxious appearing.  Calm demeanor.    Assessment and Plan: Hepatic fibrosis, congenital - Plan: Comprehensive metabolic panel, Ammonia, CBC  Scleral icterus  Here today to establish care and re-check labs following a recent ER visit.  She has congenital hepatic fibrosis which unfortunately has been getting worse over the last year  or so Compared with recent labs her LFTS are stable, her sodium is slightly low.  CBC is typical for her with thrombocytopenia.  Her ammonia is not improved- will contact her GI doctor tomorrow to ensure proper follow-up and seek guidance about her lactulose treatment  Results for orders placed or performed in visit on 06/05/16  Comprehensive metabolic panel  Result Value Ref Range   Sodium 131 (L) 135 - 145 mEq/L   Potassium 4.3 3.5 - 5.1 mEq/L   Chloride 100 96 - 112 mEq/L   CO2 26 19 - 32 mEq/L   Glucose, Bld 231 (H) 70 - 99 mg/dL   BUN 12 6 - 23 mg/dL   Creatinine, Ser 1.16 0.40 - 1.20 mg/dL   Total Bilirubin 2.0 (H) 0.2 - 1.2 mg/dL   Alkaline Phosphatase 131 (H) 39 - 117 U/L   AST 43 (H) 0 - 37 U/L   ALT 54 (H) 0 - 35 U/L   Total Protein 5.7 (L) 6.0 - 8.3 g/dL   Albumin 3.2 (L) 3.5 - 5.2 g/dL   Calcium 8.5 8.4 - 10.5 mg/dL   GFR 51.08 (L) >60.00 mL/min  Ammonia  Result Value Ref Range   Ammonia 74 (H) 11 - 35 umol/L  CBC  Result Value Ref Range   WBC 7.0 4.0 - 10.5 K/uL   RBC 3.65 (L) 3.87 - 5.11 Mil/uL   Platelets 77.0 (L) 150.0 - 400.0 K/uL   Hemoglobin 12.9 12.0 - 15.0 g/dL   HCT 36.9 36.0 - 46.0 %   MCV 101.3 (H) 78.0 - 100.0 fl   MCHC 34.9 30.0 - 36.0 g/dL   RDW 14.5 11.5 - 15.5 %     Signed Alicia Blinks, MD  Received her labs and called to check on her. Her ammonia is a bit higher.  However, she continues to feel well.  Called and discussed with doc on call for her  hepatologist at [name]; clinical picture is more important than her ammonia level. Her Na is a bit low- will have her restrict fluid to less than 2 quarts daily.  Will need to recheck with myself of her GI doc next week.   She is taking lactulose 15 BID- having BM daily but not large amounts.  She will keep her fluids to under 2 quarts daily.  She will report any change in her condition or weight gain in the meantime

## 2016-06-11 ENCOUNTER — Encounter: Payer: Self-pay | Admitting: Family

## 2016-06-11 ENCOUNTER — Ambulatory Visit (INDEPENDENT_AMBULATORY_CARE_PROVIDER_SITE_OTHER): Payer: BLUE CROSS/BLUE SHIELD | Admitting: Family

## 2016-06-11 DIAGNOSIS — L03116 Cellulitis of left lower limb: Secondary | ICD-10-CM

## 2016-06-11 DIAGNOSIS — E871 Hypo-osmolality and hyponatremia: Secondary | ICD-10-CM | POA: Diagnosis not present

## 2016-06-11 DIAGNOSIS — R739 Hyperglycemia, unspecified: Secondary | ICD-10-CM

## 2016-06-11 DIAGNOSIS — K7460 Unspecified cirrhosis of liver: Secondary | ICD-10-CM

## 2016-06-11 MED ORDER — CEPHALEXIN 500 MG PO CAPS: 500.0000 mg | ORAL_CAPSULE | Freq: Two times a day (BID) | ORAL | 0 refills | Status: DC

## 2016-06-11 NOTE — Progress Notes (Signed)
Subjective:    Patient ID: Alicia [name], female    DOB: [birth year], 55 y.o.   MRN: [id number]  HPI  Alicia [name] is a 55 yr old female who presents today with chief complaint of redness and pain in the left thigh. Pain has been present for 2-3 days.  Reports area is warm and tender.   She does have a history of congenital hepatic fibrosis/portal hypertension and sarcoidosis  She is followe by Dr. Gertie Fey at [facility] (hepatology) and has upcoming appointment with him on 06/27/16.  Her current diuretic regimen includes lasix 80mg  daily, aldactone 200mg  daily.  She is continued on lactulose for elevated ammonia levels.  Wt Readings from Last 3 Encounters:  06/11/16 157 lb 3.2 oz (71.3 kg)  06/05/16 155 lb (70.3 kg)  05/29/16 156 lb 4 oz (70.9 kg)     Review of Systems See HPI  Past Medical History:  Diagnosis Date  . Allergy   . Anemia   . Asthma   . Hepatic fibrosis ([place])   . Sarcoidosis [facility])      Social History   Social History  . Marital status: Married    Spouse name: N/A  . Number of children: N/A  . 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 Smoking status: Never Smoker  . Smokeless tobacco: Never Used  . Alcohol use Yes     Comment: wine nightly  . Drug use: No  . Sexual activity: Not on file   Other Topics Concern  . Not on file   Social History Narrative  . No narrative on file    Past Surgical History:  Procedure Laterality Date  . SPINAL FUSION  12/2007   Double spinal Fusion  . VIDEO BRONCHOSCOPY Bilateral 12/08/2015   Procedure: VIDEO BRONCHOSCOPY WITH FLUORO;  Surgeon: Rigoberto Noel, MD;  Location: [place];  Service: Cardiopulmonary;  Laterality: Bilateral;    Family History  Problem Relation Age of Onset  . Arthritis Mother   . Heart disease Father     Triple bypass  . Heart attack Paternal Grandmother   . Stroke Paternal Grandfather   . Liver disease Sister    Juvenile cysitc liver disease  . Asthma Sister     Allergies  Allergen Reactions  . Aspirin Other (See Comments)    Causes Asthma  . Dairy Aid [Lactase]     Causes Asthma attacks  . Eggs Or Egg-Derived Products     Causes Asthma  . Azithromycin Nausea And Vomiting  . Chocolate     Asthma attack, hives  . Corn Oil     Other reaction(s): Other (See Comments) Unknown  . Gabapentin Other (See Comments)    Blisters in mouth  . Peanut-Containing Drug Products     Asthma, eye swelling, itchy eyes, can't catch her breath  . Wheat     Asthma, hives    Current Outpatient Prescriptions on File Prior to Visit  Medication Sig Dispense Refill  . benzonatate (TESSALON) 100 MG capsule TAKE 1 CAPSULE (100 MG TOTAL) BY MOUTH EVERY 8 (EIGHT) HOURS AS NEEDED. 30 capsule 3  . furosemide (LASIX) 80 MG tablet Take 80 mg by mouth daily.    Marland Kitchen KLOR-CON M20 20 MEQ tablet Take 1 tablet by mouth daily. Reported on 11/09/2015  10  . Lactulose 20 GM/30ML SOLN Take 22.5 mLs (15 g total) by mouth 2 (two) times daily. 500 mL 0  .  mometasone-formoterol (DULERA) 200-5 MCG/ACT AERO INHALE 2 PUFFS BY MOUTH TWO TIMES DAILY. 1 Inhaler 5  . pantoprazole (PROTONIX) 40 MG tablet Take 1 tablet by mouth 2 (two) times daily.     Marland Kitchen PROAIR HFA 108 (90 Base) MCG/ACT inhaler INHALE 2 PUFFS EVERY FOUR (4) HOURS AS NEEDED FOR WHEEZING. 8.5 Inhaler 0  . ranitidine (ZANTAC) 300 MG capsule Take 1 capsule (300 mg total) by mouth at bedtime. 90 capsule 1  . Spacer/Aero-Holding Chambers (AEROCHAMBER MV) inhaler Use as instructed 1 each 0  . spironolactone (ALDACTONE) 100 MG tablet Take 200 mg by mouth daily.     No current facility-administered medications on file prior to visit.     BP 112/62 (BP Location: Left Arm, Cuff Size: Normal)   Pulse 96   Temp 99.8 F (37.7 C) (Oral)   Resp 18   Ht 5\' 3"  (1.6 m)   Wt 157 lb 3.2 oz (71.3 kg)   SpO2 100% Comment: room air  BMI 27.85 kg/m       Objective:   Physical Exam    Constitutional: She is oriented to person, place, and time. She appears well-developed and well-nourished.  Eyes: Scleral icterus is present.  Cardiovascular: Normal rate, regular rhythm and normal heart sounds.   No murmur heard. Pulmonary/Chest: Effort normal and breath sounds normal. No respiratory distress. She has no wheezes.  Abdominal:  Mild abdominal distension/ascites noted.  Musculoskeletal:  1+ bilateral pedal edema  Neurological: She is alert and oriented to person, place, and time.  Skin: Skin is warm and dry.  Skin rash left anterior thigh is warm to touch  Psychiatric: She has a normal mood and affect. Her behavior is normal. Judgment and thought content normal.            Assessment & Plan:  Hyperglycemia- Noted to have sugar 221 last week. Denies recent steroid use. Will obtain A1C.    Cellulitis-  Since there are not blisters and the area is more circular, I favor cellulitis over herpes zoster.  Will initiate keflex.  Advised pt:  Call if increased pain, swelling, redness or if you develop fever. Also, let us know if you develop blisters on your leg. Obtain CBC.    Congenital hepatic fibrosis- will check follow up bmet to assess kidney function/sodium.  Will also obtain follow up ammonia level. Pt is advised to continue to monitor her weights and call if she has any further weight gain.

## 2016-06-11 NOTE — Addendum Note (Signed)
Addended by: Caffie Pinto on: 06/11/2016 04:57 PM   Modules accepted: Orders

## 2016-06-11 NOTE — Progress Notes (Signed)
Pre visit review using our clinic review tool, if applicable. No additional management support is needed unless otherwise documented below in the visit note. 

## 2016-06-11 NOTE — Patient Instructions (Signed)
Please begin keflex (antibiotic) for skin rash on your leg. Call if increased pain, swelling, redness or if you develop fever. Also, let us know if you develop blisters on your leg. Complete lab work prior to leaving. Continue to monitor your weights.  Keep your upcoming appointment with GI.

## 2016-06-11 NOTE — Addendum Note (Signed)
Addended by: Caffie Pinto on: 06/11/2016 04:38 PM   Modules accepted: Orders

## 2016-06-12 ENCOUNTER — Ambulatory Visit: Payer: BLUE CROSS/BLUE SHIELD | Admitting: Family Medicine

## 2016-06-12 ENCOUNTER — Encounter: Payer: Self-pay | Admitting: Family

## 2016-06-12 LAB — BASIC METABOLIC PANEL
BUN: 10 mg/dL (ref 6–23)
CALCIUM: 8.5 mg/dL (ref 8.4–10.5)
CHLORIDE: 103 meq/L (ref 96–112)
CO2: 27 meq/L (ref 19–32)
CREATININE: 1.06 mg/dL (ref 0.40–1.20)
GFR: 56.68 mL/min — ABNORMAL LOW (ref >60.00)
GLUCOSE: 128 mg/dL — AB (ref 70–99)
Potassium: 4.7 mEq/L (ref 3.5–5.1)
Sodium: 138 mEq/L (ref 135–145)

## 2016-06-12 LAB — CBC
HEMATOCRIT: 40.6 % (ref 36.0–46.0)
Hemoglobin: 14.1 g/dL (ref 12.0–15.0)
MCHC: 34.6 g/dL (ref 30.0–36.0)
MCV: 102.4 fl — AB (ref 78.0–100.0)
Platelets: 94 10*3/uL — ABNORMAL LOW (ref 150.0–400.0)
RBC: 3.97 Mil/uL (ref 3.87–5.11)
RDW: 14.8 % (ref 11.5–15.5)
WBC: 10.9 10*3/uL — ABNORMAL HIGH (ref 4.0–10.5)

## 2016-06-12 LAB — HEMOGLOBIN A1C: HEMOGLOBIN A1C: 4.5 % — AB (ref 4.6–6.5)

## 2016-06-12 LAB — AMMONIA: AMMONIA: 41 umol/L (ref <=47)

## 2016-06-13 ENCOUNTER — Emergency Department (HOSPITAL_BASED_OUTPATIENT_CLINIC_OR_DEPARTMENT_OTHER)
Admission: EM | Admit: 2016-06-13 | Discharge: 2016-06-13 | Disposition: A | Discharge status: Home / Self Care | Payer: BLUE CROSS/BLUE SHIELD | Attending: Emergency Medicine | Admitting: Emergency Medicine

## 2016-06-13 ENCOUNTER — Encounter (HOSPITAL_BASED_OUTPATIENT_CLINIC_OR_DEPARTMENT_OTHER): Payer: Self-pay

## 2016-06-13 ENCOUNTER — Telehealth: Payer: Self-pay | Admitting: Family Medicine

## 2016-06-13 DIAGNOSIS — I10 Essential (primary) hypertension: Secondary | ICD-10-CM | POA: Insufficient documentation

## 2016-06-13 DIAGNOSIS — M79605 Pain in left leg: Secondary | ICD-10-CM | POA: Diagnosis present

## 2016-06-13 DIAGNOSIS — J45909 Unspecified asthma, uncomplicated: Secondary | ICD-10-CM | POA: Diagnosis not present

## 2016-06-13 DIAGNOSIS — L03116 Cellulitis of left lower limb: Secondary | ICD-10-CM

## 2016-06-13 LAB — COMPREHENSIVE METABOLIC PANEL
ALBUMIN: 3 g/dL — AB (ref 3.5–5.0)
ALK PHOS: 142 U/L — AB (ref 38–126)
ALT: 68 U/L — ABNORMAL HIGH (ref 14–54)
ANION GAP: 8 (ref 5–15)
AST: 53 U/L — ABNORMAL HIGH (ref 15–41)
BILIRUBIN TOTAL: 2.6 mg/dL — AB (ref 0.3–1.2)
BUN: 14 mg/dL (ref 6–20)
CALCIUM: 8.4 mg/dL — AB (ref 8.9–10.3)
CO2: 23 mmol/L (ref 22–32)
Chloride: 105 mmol/L (ref 101–111)
Creatinine, Ser: 1.01 mg/dL — ABNORMAL HIGH (ref 0.44–1.00)
GFR calc non Af Amer: >60 mL/min (ref >60)
GLUCOSE: 111 mg/dL — AB (ref 65–99)
Potassium: 4.1 mmol/L (ref 3.5–5.1)
Sodium: 136 mmol/L (ref 135–145)
TOTAL PROTEIN: 5.9 g/dL — AB (ref 6.5–8.1)

## 2016-06-13 LAB — CBC
HEMATOCRIT: 36.3 % (ref 36.0–46.0)
HEMOGLOBIN: 12.8 g/dL (ref 12.0–15.0)
MCH: 35.9 pg — AB (ref 26.0–34.0)
MCHC: 35.3 g/dL (ref 30.0–36.0)
MCV: 101.7 fL — AB (ref 78.0–100.0)
Platelets: 74 10*3/uL — ABNORMAL LOW (ref 150–400)
RBC: 3.57 MIL/uL — ABNORMAL LOW (ref 3.87–5.11)
RDW: 14.5 % (ref 11.5–15.5)
WBC: 7.1 10*3/uL (ref 4.0–10.5)

## 2016-06-13 MED ORDER — CEPHALEXIN 500 MG PO CAPS: 500.0000 mg | ORAL_CAPSULE | Freq: Four times a day (QID) | ORAL | 0 refills | Status: DC

## 2016-06-13 MED ORDER — DEXTROSE 5 % IV SOLN
1.0000 g | Freq: Once | INTRAVENOUS | Status: AC
  Administered 2016-06-13: 1 g via INTRAVENOUS
  Filled 2016-06-13: qty 10

## 2016-06-13 NOTE — Telephone Encounter (Signed)
Pt agreed to ED eval, see phone note dated for today.

## 2016-06-13 NOTE — ED Triage Notes (Signed)
C/o infection to left leg-started over the weekend-was seen by PCP Tuesday-started on abx-pt c/o fever, spread of redness-was advised over the phone to come to ED-NAD-steady gait

## 2016-06-13 NOTE — Telephone Encounter (Signed)
Called pt, she states her fever has returned since previous call. Currently 99.55F and climbing. Pt agreed to go to ED as previously advised. She is planning to go to [place] ED.

## 2016-06-13 NOTE — Discharge Instructions (Signed)
You were seen in the ED for cellulitis of the left leg. Please take Keflex 4 times daily to complete a 7 day course. Please follow with your PCP tomorrow. If you feel you are getting worse, have worsening fevers or worsening rash return to the ED for evaluation

## 2016-06-13 NOTE — Telephone Encounter (Signed)
Also left message on work voice mail instructing pt to check her mychart.  Could you please follow up with patient today to make sure she gets the message to go to the ER?    thanks

## 2016-06-13 NOTE — Telephone Encounter (Signed)
Per chart review, pt has arrived in ED as advised.

## 2016-06-13 NOTE — Telephone Encounter (Signed)
Caller name: Relationship to patient: Self  Can be reached: [phone]  Pharmacy:  Reason for call: Patient wants a call back about going to the ER (suggested by Debbrah Alar this morning). States she no longer has a fever and wants to know if she really needs to go to the ER.

## 2016-06-13 NOTE — ED Provider Notes (Signed)
[facility] Provider Note   CSN: [id number] Arrival date & time: 06/13/16  1249     History   Chief Complaint Chief Complaint  Patient presents with  . Leg Pain     HPI   Alicia [name] is a 55 y.o. Female With a significant history for hepatic fibrosis, hypertension presents for concern for worsening cellulitis despite Keflex treatment. Noted painful erythema on left thigh 4 days ago and was seen by her PCP 2 days ago and given Keflex for this. She had a Tmax to 102 at that time. Since starting Keflex she feels that while her rash has spread across her entire thigh it is becoming more patchy in nature. She reports continued low-grade fevers with Tmax 99.6 this AM that when she told her PCP, she was directed to come to the ED. Otherwise she denies insect bites, nausea, vomiting, diarrhea, shortness of breath, chest pain, abdominal pain, dysuria, vaginal irritation.  She denies recent travel, recent cancer diagnoses, recent surgery  Past Medical History:  Diagnosis Date  . Allergy   . Anemia   . Asthma   . Hepatic fibrosis ([place])   . Sarcoidosis [facility])     Patient Active Problem List   Diagnosis Date Noted  . Multiple pulmonary nodules determined by computed tomography of lung   . Chronic cough 09/07/2015  . Hypertension, portal ([place]) 09/04/2011  . Asthma with chronic obstructive pulmonary disease (COPD) ([place]) 09/04/2011  . 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 GERD (gastroesophageal reflux disease) 07/04/2011  . Sarcoid ([place]) 07/01/2011  . Asthma, severe persistent, with upper airway instability 06/13/2011  . 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 SPINAL FUSION  12/2007   Double spinal Fusion  . VIDEO BRONCHOSCOPY Bilateral 12/08/2015   Procedure: VIDEO BRONCHOSCOPY WITH FLUORO;  Surgeon: Rigoberto Noel, MD;  Location: [place];  Service: Cardiopulmonary;  Laterality: Bilateral;    OB History    No  data available       Home Medications    Prior to Admission medications   Medication Sig Start Date End Date Taking? Authorizing Provider  benzonatate (TESSALON) 100 MG capsule TAKE 1 CAPSULE (100 MG TOTAL) BY MOUTH EVERY 8 (EIGHT) HOURS AS NEEDED. 04/09/16   Rigoberto Noel, MD  cephALEXin (KEFLEX) 500 MG capsule Take 1 capsule (500 mg total) by mouth 4 (four) times daily. 06/13/16   Jace Fermin A Alexandra Lipps, MD  cephALEXin (KEFLEX) 500 MG capsule Take 1 capsule (500 mg total) by mouth 4 (four) times daily. 06/13/16   Veatrice Bourbon, MD  furosemide (LASIX) 80 MG tablet Take 80 mg by mouth daily.    Historical Provider, MD  KLOR-CON M20 20 MEQ tablet Take 1 tablet by mouth daily. Reported on 11/09/2015 09/01/14   Historical Provider, MD  Lactulose 20 GM/30ML SOLN Take 22.5 mLs (15 g total) by mouth 2 (two) times daily. 05/29/16   Shary Decamp, PA-C  mometasone-formoterol (DULERA) 200-5 MCG/ACT AERO INHALE 2 PUFFS BY MOUTH TWO TIMES DAILY. 01/09/16   Rigoberto Noel, MD  pantoprazole (PROTONIX) 40 MG tablet Take 1 tablet by mouth 2 (two) times daily.  04/05/11   Historical Provider, MD  PROAIR HFA 108 (90 Base) MCG/ACT inhaler INHALE 2 PUFFS EVERY FOUR (4) HOURS AS NEEDED FOR WHEEZING. 11/10/15   Rigoberto Noel, MD  ranitidine (ZANTAC) 300 MG capsule Take 1 capsule (300 mg total) by mouth at bedtime. 12/19/15   Rigoberto Noel,  MD  Spacer/Aero-Holding [place] (AEROCHAMBER MV) inhaler Use as instructed 09/08/14   Elsie Stain, MD  spironolactone (ALDACTONE) 100 MG tablet Take 200 mg by mouth daily.    Historical Provider, MD    Family History Family History  Problem Relation Age of Onset  . Arthritis Mother   . Heart disease Father     Triple bypass  . Heart attack Paternal Grandmother   . Stroke Paternal Grandfather   . Liver disease Sister     Juvenile cysitc liver disease  . Asthma Sister     Social History Social History  Substance Use Topics  . Smoking status: Never Smoker  . Smokeless tobacco:  Never Used  . Alcohol use Yes     Comment: wine nightly     Allergies   Aspirin; Dairy aid [lactase]; Eggs or egg-derived products; Azithromycin; Chocolate; Corn oil; Gabapentin; Peanut-containing drug products; and Wheat   Review of Systems Review of Systems  Constitutional: Negative.   HENT: Negative.   Eyes: Negative.   Respiratory: Negative.   Cardiovascular: Negative.   Gastrointestinal: Negative.   Genitourinary: Negative.   Musculoskeletal: Negative.   Skin: Positive for rash.       Pain rash on left thigh     Physical Exam Updated Vital Signs BP 117/71 (BP Location: Left Arm)   Pulse 75   Temp 97.9 F (36.6 C) (Oral)   Resp 18   Ht 5\' 3"  (1.6 m)   Wt 70.8 kg   SpO2 98%   BMI 27.63 kg/m   Physical Exam  Constitutional: She is oriented to person, place, and time. She appears well-developed and well-nourished.  HENT:  Head: Normocephalic and atraumatic.  Eyes: Conjunctivae and EOM are normal. Pupils are equal, round, and reactive to light.  Neck: Normal range of motion.  Cardiovascular: Normal rate, regular rhythm and normal heart sounds.   No murmur heard. Abdominal: Soft. Bowel sounds are normal. She exhibits no distension. There is no tenderness.  Neurological: She is alert and oriented to person, place, and time.  Skin: Skin is warm and dry.  Psychiatric: She has a normal mood and affect.     ED Treatments / Results  Labs (all labs ordered are listed, but only abnormal results are displayed) Labs Reviewed  CBC - Abnormal; Notable for the following:       Result Value   RBC 3.57 (*)    MCV 101.7 (*)    MCH 35.9 (*)    Platelets 74 (*)    All other components within normal limits  COMPREHENSIVE METABOLIC PANEL - Abnormal; Notable for the following:    Glucose, Bld 111 (*)    Creatinine, Ser 1.01 (*)    Calcium 8.4 (*)    Total Protein 5.9 (*)    Albumin 3.0 (*)    AST 53 (*)    ALT 68 (*)    Alkaline Phosphatase 142 (*)    Total  Bilirubin 2.6 (*)    All other components within normal limits  CULTURE, BLOOD (ROUTINE X 2)  CULTURE, BLOOD (ROUTINE X 2)    EKG  EKG Interpretation None       Radiology No results found.  Procedures Procedures (including critical care time)  Medications Ordered in ED Medications  cefTRIAXone (ROCEPHIN) 1 g in dextrose 5 % 50 mL IVPB (1 g Intravenous New Bag/Given 06/13/16 1504)     Initial Impression / Assessment and Plan / ED Course  I have reviewed the triage vital  signs and the nursing notes.  Pertinent labs & imaging results that were available during my care of the patient were reviewed by me and considered in my medical decision making (see chart for details).  Clinical Course    Uncomplicated  Final Clinical Impressions(s) / ED Diagnoses   Final diagnoses:  Cellulitis of left lower extremity   55 year old female with a significant history of  hepatic fibrosis, hypertension presents for concern for worsening cellulitis given low grade temperature this AM to 99.6. Wells score 0 so less likely a DVT, no history of trauma or insect bites. She is non toxic appearing, with normal vital signs. Given history, her rash appears to be improving and she has not had an other true fever for the last 4 days. WBC improved to 7.1 from 10.9 2 days ago. Given overall improving rash and she was treated with IV Rocephin the ED and her kelfex was increased to 500 QID. She has an appointment with her PCP tomorrow and was counseled to follow as scheduled. ED return precautions were reviewed. She was discharged from the ED in stable considition   New Prescriptions New Prescriptions   CEPHALEXIN (KEFLEX) 500 MG CAPSULE    Take 1 capsule (500 mg total) by mouth 4 (four) times daily.    Terence Googe A. Lincoln Brigham MD, [facility] Resident PGY-3 Pager [phone]    Veatrice Bourbon, MD 06/13/16 [place], MD 06/13/16 [place], MD 06/15/16 [phone]

## 2016-06-13 NOTE — Telephone Encounter (Signed)
Please let patient know I am out of the office. If she is unwilling to go to the ED, she needs OV today with one of our providers pls.

## 2016-06-14 ENCOUNTER — Encounter: Payer: Self-pay | Admitting: Physician Assistant

## 2016-06-14 ENCOUNTER — Ambulatory Visit (INDEPENDENT_AMBULATORY_CARE_PROVIDER_SITE_OTHER): Payer: BLUE CROSS/BLUE SHIELD | Admitting: Physician Assistant

## 2016-06-14 VITALS — BP 109/66 | HR 78 | Temp 98.2°F | Resp 16 | Ht 63.0 in | Wt 157.2 lb

## 2016-06-14 DIAGNOSIS — L03116 Cellulitis of left lower limb: Secondary | ICD-10-CM

## 2016-06-14 NOTE — Progress Notes (Signed)
Patient presents to clinic today c/o for ER follow-up of cellulitis of Left thigh. Patient originally presented to PCP office on 06/11/2016. Started on Keflex and given strict return precautions. Since that time, she had developed a fever with MAXIMUM TEMPERATURE of 103F. Also worsening redness and tenderness. Call PCP office and was encouraged to go to the emergency room. Patient presented to the ER on 06/13/2016. Was started on IV Rocephin. Patient was stable and discharged on increased frequency of Keflex. Even strict follow-up here in the office. Patient endorses taking medications as directed. Endorses symptoms have markedly improved. Endorses redness has resolved. Does note some mild residual tenderness skin. Denies recurrence of fever. Denies new symptoms.  Past Medical History:  Diagnosis Date  . Allergy   . Anemia   . Asthma   . Hepatic fibrosis ([place])   . Sarcoidosis [facility])     Current Outpatient Prescriptions on File Prior to Visit  Medication Sig Dispense Refill  . benzonatate (TESSALON) 100 MG capsule TAKE 1 CAPSULE (100 MG TOTAL) BY MOUTH EVERY 8 (EIGHT) HOURS AS NEEDED. 30 capsule 3  . cephALEXin (KEFLEX) 500 MG capsule Take 1 capsule (500 mg total) by mouth 4 (four) times daily. 14 capsule 0  . furosemide (LASIX) 80 MG tablet Take 80 mg by mouth daily.    Marland Kitchen KLOR-CON M20 20 MEQ tablet Take 1 tablet by mouth daily. Reported on 11/09/2015  10  . Lactulose 20 GM/30ML SOLN Take 22.5 mLs (15 g total) by mouth 2 (two) times daily. 500 mL 0  . mometasone-formoterol (DULERA) 200-5 MCG/ACT AERO INHALE 2 PUFFS BY MOUTH TWO TIMES DAILY. 1 Inhaler 5  . pantoprazole (PROTONIX) 40 MG tablet Take 1 tablet by mouth 2 (two) times daily.     Marland Kitchen PROAIR HFA 108 (90 Base) MCG/ACT inhaler INHALE 2 PUFFS EVERY FOUR (4) HOURS AS NEEDED FOR WHEEZING. 8.5 Inhaler 0  . ranitidine (ZANTAC) 300 MG capsule Take 1 capsule (300 mg total) by mouth at bedtime. 90 capsule 1  . Spacer/Aero-Holding Chambers  (AEROCHAMBER MV) inhaler Use as instructed 1 each 0  . spironolactone (ALDACTONE) 100 MG tablet Take 200 mg by mouth daily.     No current facility-administered medications on file prior to visit.     Allergies  Allergen Reactions  . Aspirin Other (See Comments)    Causes Asthma  . Dairy Aid [Lactase]     Causes Asthma attacks  . Eggs Or Egg-Derived Products     Causes Asthma  . Azithromycin Nausea And Vomiting  . Chocolate     Asthma attack, hives  . Corn Oil     CORN ONLY Other reaction(s): Other (See Comments) Unknown  . Gabapentin Other (See Comments)    Blisters in mouth  . Peanut-Containing Drug Products     Asthma, eye swelling, itchy eyes, can't catch her breath  . Wheat     Asthma, hives    Family History  Problem Relation Age of Onset  . Arthritis Mother   . Heart disease Father     Triple bypass  . Heart attack Paternal Grandmother   . Stroke Paternal Grandfather   . Liver disease Sister     Juvenile cysitc liver disease  . Asthma Sister     Social History   Social History  . Marital status: Married    Spouse name: N/A  . Number of children: N/A  . Years of education: N/A   Occupational History  . HR Manager [employer]  Social History Main Topics  . Smoking status: Never Smoker  . Smokeless tobacco: Never Used  . Alcohol use Yes     Comment: wine nightly  . Drug use: No  . Sexual activity: Not Asked   Other Topics Concern  . None   Social History Narrative  . None   Review of Systems - See HPI.  All other ROS are negative.  BP 109/66 (BP Location: Right Arm, Patient Position: Sitting, Cuff Size: Normal)   Pulse 78   Temp 98.2 F (36.8 C) (Oral)   Resp 16   Ht '5\' 3"'  (1.6 m)   Wt 157 lb 4 oz (71.3 kg)   SpO2 100%   BMI 27.86 kg/m   Physical Exam  Constitutional: She is oriented to person, place, and time and well-developed, well-nourished, and in no distress.  HENT:  Head: Normocephalic and atraumatic.  Eyes: Conjunctivae  are normal.  Neck: Neck supple.  Cardiovascular: Normal rate, regular rhythm, normal heart sounds and intact distal pulses.   Pulmonary/Chest: Effort normal. No respiratory distress. She has no wheezes. She has no rales. She exhibits no tenderness.  Neurological: She is alert and oriented to person, place, and time.  Skin: Skin is warm and dry. No rash noted.     Psychiatric: Affect normal.  Vitals reviewed.   Recent Results (from the past 2160 hour(s))  Pulmonary function test     Status: None   Collection Time: 03/20/16  1:55 PM  Result Value Ref Range   FVC-Pre 2.23 L   FVC-%Pred-Pre 68 %   FVC-Post 2.53 L   FVC-%Pred-Post 77 %   FVC-%Change-Post 13 %   FEV1-Pre 1.29 L   FEV1-%Pred-Pre 51 %   FEV1-Post 1.52 L   FEV1-%Pred-Post 59 %   FEV1-%Change-Post 17 %   FEV6-Pre 2.20 L   FEV6-%Pred-Pre 69 %   FEV6-Post 2.49 L   FEV6-%Pred-Post 79 %   FEV6-%Change-Post 13 %   Pre FEV1/FVC ratio 58 %   FEV1FVC-%Pred-Pre 73 %   Post FEV1/FVC ratio 60 %   FEV1FVC-%Change-Post 3 %   Pre FEV6/FVC Ratio 98 %   FEV6FVC-%Pred-Pre 101 %   Post FEV6/FVC ratio 99 %   FEV6FVC-%Pred-Post 101 %   FEV6FVC-%Change-Post 0 %   FEF 25-75 Pre 0.65 L/sec   FEF2575-%Pred-Pre 27 %   FEF 25-75 Post 1.03 L/sec   FEF2575-%Pred-Post 42 %   FEF2575-%Change-Post 58 %   RV 5.52 L   RV % pred 293 %   TLC 8.53 L   TLC % pred 174 %   DLCO unc 18.50 ml/min/mmHg   DLCO unc % pred 80 %   DL/VA 4.55 ml/min/mmHg/L   DL/VA % pred 97 %  Urinalysis, Routine w reflex microscopic (not at [facility])     Status: Abnormal   Collection Time: 05/29/16  5:18 PM  Result Value Ref Range   Color, Urine YELLOW YELLOW   APPearance CLEAR CLEAR   Specific Gravity, Urine 1.009 1.005 - 1.030   pH 6.5 5.0 - 8.0   Glucose, UA 100 (A) NEGATIVE mg/dL   Hgb urine dipstick NEGATIVE NEGATIVE   Bilirubin Urine NEGATIVE NEGATIVE   Ketones, ur NEGATIVE NEGATIVE mg/dL   Protein, ur NEGATIVE NEGATIVE mg/dL   Nitrite NEGATIVE NEGATIVE     Leukocytes, UA NEGATIVE NEGATIVE    Comment: MICROSCOPIC NOT DONE ON URINES WITH NEGATIVE PROTEIN, BLOOD, LEUKOCYTES, NITRITE, OR GLUCOSE <1000 mg/dL.  Pregnancy, urine     Status: None   Collection Time:  05/29/16  5:18 PM  Result Value Ref Range   Preg Test, Ur NEGATIVE NEGATIVE    Comment:        THE SENSITIVITY OF THIS METHODOLOGY IS >20 mIU/mL.   CBC     Status: Abnormal   Collection Time: 05/29/16  5:35 PM  Result Value Ref Range   WBC 8.1 4.0 - 10.5 K/uL   RBC 3.60 (L) 3.87 - 5.11 MIL/uL   Hemoglobin 12.8 12.0 - 15.0 g/dL   HCT 36.0 36.0 - 46.0 %   MCV 100.0 78.0 - 100.0 fL   MCH 35.6 (H) 26.0 - 34.0 pg   MCHC 35.6 30.0 - 36.0 g/dL   RDW 14.6 11.5 - 15.5 %   Platelets 72 (L) 150 - 400 K/uL    Comment: PLATELET COUNT CONFIRMED BY SMEAR SPECIMEN CHECKED FOR CLOTS LARGE PLATELETS PRESENT PLATELETS APPEAR DECREASED   Comprehensive metabolic panel     Status: Abnormal   Collection Time: 05/29/16  5:35 PM  Result Value Ref Range   Sodium 133 (L) 135 - 145 mmol/L   Potassium 4.2 3.5 - 5.1 mmol/L   Chloride 104 101 - 111 mmol/L   CO2 23 22 - 32 mmol/L   Glucose, Bld 87 65 - 99 mg/dL   BUN 13 6 - 20 mg/dL   Creatinine, Ser 1.23 (H) 0.44 - 1.00 mg/dL   Calcium 8.6 (L) 8.9 - 10.3 mg/dL   Total Protein 5.9 (L) 6.5 - 8.1 g/dL   Albumin 3.1 (L) 3.5 - 5.0 g/dL   AST 63 (H) 15 - 41 U/L   ALT 62 (H) 14 - 54 U/L   Alkaline Phosphatase 122 38 - 126 U/L   Total Bilirubin 2.4 (H) 0.3 - 1.2 mg/dL   GFR calc non Af Amer 48 (L) >60 mL/min   GFR calc Af Amer 55 (L) >60 mL/min    Comment: (NOTE) The eGFR has been calculated using the CKD EPI equation. This calculation has not been validated in all clinical situations. eGFR's persistently <60 mL/min signify possible Chronic Kidney Disease.    Anion gap 6 5 - 15  Ammonia     Status: Abnormal   Collection Time: 05/29/16  5:35 PM  Result Value Ref Range   Ammonia 60 (H) 9 - 35 umol/L  Brain natriuretic peptide     Status: None    Collection Time: 05/29/16  5:35 PM  Result Value Ref Range   B Natriuretic Peptide 28.9 0.0 - 100.0 pg/mL  I-Stat CG4 Lactic Acid, ED     Status: None   Collection Time: 05/29/16  5:49 PM  Result Value Ref Range   Lactic Acid, Venous 1.51 0.5 - 1.9 mmol/L  Protime-INR     Status: None   Collection Time: 05/29/16  7:15 PM  Result Value Ref Range   Prothrombin Time 15.0 11.4 - 15.2 seconds   INR 1.17   Comprehensive metabolic panel     Status: Abnormal   Collection Time: 06/05/16  2:50 PM  Result Value Ref Range   Sodium 131 (L) 135 - 145 mEq/L   Potassium 4.3 3.5 - 5.1 mEq/L   Chloride 100 96 - 112 mEq/L   CO2 26 19 - 32 mEq/L   Glucose, Bld 231 (H) 70 - 99 mg/dL   BUN 12 6 - 23 mg/dL   Creatinine, Ser 1.16 0.40 - 1.20 mg/dL   Total Bilirubin 2.0 (H) 0.2 - 1.2 mg/dL   Alkaline Phosphatase 131 (H) 39 - 117  U/L   AST 43 (H) 0 - 37 U/L   ALT 54 (H) 0 - 35 U/L   Total Protein 5.7 (L) 6.0 - 8.3 g/dL   Albumin 3.2 (L) 3.5 - 5.2 g/dL   Calcium 8.5 8.4 - 10.5 mg/dL   GFR 51.08 (L) >60.00 mL/min  Ammonia     Status: Abnormal   Collection Time: 06/05/16  2:50 PM  Result Value Ref Range   Ammonia 74 (H) 11 - 35 umol/L  CBC     Status: Abnormal   Collection Time: 06/05/16  2:50 PM  Result Value Ref Range   WBC 7.0 4.0 - 10.5 K/uL   RBC 3.65 (L) 3.87 - 5.11 Mil/uL   Platelets 77.0 (L) 150.0 - 400.0 K/uL   Hemoglobin 12.9 12.0 - 15.0 g/dL   HCT 36.9 36.0 - 46.0 %   MCV 101.3 (H) 78.0 - 100.0 fl   MCHC 34.9 30.0 - 36.0 g/dL   RDW 14.5 11.5 - 15.5 %  Hemoglobin A1c     Status: Abnormal   Collection Time: 06/11/16  4:12 PM  Result Value Ref Range   Hgb A1c MFr Bld 4.5 (L) 4.6 - 6.5 %    Comment: Glycemic Control Guidelines for People with Diabetes:Non Diabetic:  <6%Goal of Therapy: <7%Additional Action Suggested:  >1%   Basic metabolic panel     Status: Abnormal   Collection Time: 06/11/16  4:12 PM  Result Value Ref Range   Sodium 138 135 - 145 mEq/L   Potassium 4.7 3.5 - 5.1  mEq/L   Chloride 103 96 - 112 mEq/L   CO2 27 19 - 32 mEq/L   Glucose, Bld 128 (H) 70 - 99 mg/dL   BUN 10 6 - 23 mg/dL   Creatinine, Ser 1.06 0.40 - 1.20 mg/dL   Calcium 8.5 8.4 - 10.5 mg/dL   GFR 56.68 (L) >60.00 mL/min  CBC     Status: Abnormal   Collection Time: 06/11/16  4:12 PM  Result Value Ref Range   WBC 10.9 (H) 4.0 - 10.5 K/uL   RBC 3.97 3.87 - 5.11 Mil/uL   Platelets 94.0 (L) 150.0 - 400.0 K/uL   Hemoglobin 14.1 12.0 - 15.0 g/dL   HCT 40.6 36.0 - 46.0 %   MCV 102.4 (H) 78.0 - 100.0 fl   MCHC 34.6 30.0 - 36.0 g/dL   RDW 14.8 11.5 - 15.5 %  AMMONIA     Status: None   Collection Time: 06/11/16  4:38 PM  Result Value Ref Range   Ammonia 41 < OR = 47 umol/L    Comment: ** Please note change in reference range(s). **     CBC     Status: Abnormal   Collection Time: 06/13/16  1:45 PM  Result Value Ref Range   WBC 7.1 4.0 - 10.5 K/uL   RBC 3.57 (L) 3.87 - 5.11 MIL/uL   Hemoglobin 12.8 12.0 - 15.0 g/dL   HCT 36.3 36.0 - 46.0 %   MCV 101.7 (H) 78.0 - 100.0 fL   MCH 35.9 (H) 26.0 - 34.0 pg   MCHC 35.3 30.0 - 36.0 g/dL   RDW 14.5 11.5 - 15.5 %   Platelets 74 (L) 150 - 400 K/uL    Comment: SPECIMEN CHECKED FOR CLOTS PLATELET COUNT CONFIRMED BY SMEAR   Comprehensive metabolic panel     Status: Abnormal   Collection Time: 06/13/16  1:45 PM  Result Value Ref Range   Sodium 136 135 - 145 mmol/L  Potassium 4.1 3.5 - 5.1 mmol/L   Chloride 105 101 - 111 mmol/L   CO2 23 22 - 32 mmol/L   Glucose, Bld 111 (H) 65 - 99 mg/dL   BUN 14 6 - 20 mg/dL   Creatinine, Ser 1.01 (H) 0.44 - 1.00 mg/dL   Calcium 8.4 (L) 8.9 - 10.3 mg/dL   Total Protein 5.9 (L) 6.5 - 8.1 g/dL   Albumin 3.0 (L) 3.5 - 5.0 g/dL   AST 53 (H) 15 - 41 U/L   ALT 68 (H) 14 - 54 U/L   Alkaline Phosphatase 142 (H) 38 - 126 U/L   Total Bilirubin 2.6 (H) 0.3 - 1.2 mg/dL   GFR calc non Af Amer >60 >60 mL/min   GFR calc Af Amer >60 >60 mL/min    Comment: (NOTE) The eGFR has been calculated using the CKD EPI  equation. This calculation has not been validated in all clinical situations. eGFR's persistently <60 mL/min signify possible Chronic Kidney Disease.    Anion gap 8 5 - 15   Assessment/Plan: 1. Cellulitis of left thigh Continue Keflex QID. Continue supportive measures. FU scheduled for early next week. Reviewed alarm signs and symptoms with patient that would prompt immediate return to the ER. Patient voices understanding and agreement with plan.    Leeanne Rio, PA-C

## 2016-06-14 NOTE — Patient Instructions (Signed)
Please continue the Keflex four times per day as directed. If you have any issue picking up the new prescription, let me know. Your leg looks great today. Follow-up next week as scheduled.  If there is any recurrence of symptoms this weekend, especially fever, please go to the ER.

## 2016-06-18 LAB — CULTURE, BLOOD (ROUTINE X 2)
Culture: NO GROWTH
Culture: NO GROWTH

## 2016-06-19 ENCOUNTER — Encounter: Payer: Self-pay | Admitting: Family Medicine

## 2016-06-19 ENCOUNTER — Ambulatory Visit (INDEPENDENT_AMBULATORY_CARE_PROVIDER_SITE_OTHER): Payer: BLUE CROSS/BLUE SHIELD | Admitting: Family Medicine

## 2016-06-19 VITALS — BP 112/70 | HR 94 | Temp 98.5°F | Ht 63.0 in | Wt 158.2 lb

## 2016-06-19 DIAGNOSIS — R600 Localized edema: Secondary | ICD-10-CM

## 2016-06-19 DIAGNOSIS — L03116 Cellulitis of left lower limb: Secondary | ICD-10-CM | POA: Diagnosis not present

## 2016-06-19 DIAGNOSIS — E871 Hypo-osmolality and hyponatremia: Secondary | ICD-10-CM

## 2016-06-19 NOTE — Progress Notes (Signed)
[place] at [facility] [address], [place], [place] [address] [id number] [phone]  Date:  06/19/2016   Name:  Alicia [name]   DOB:  09-30-1961   MRN:  [id number]  PCP:  Lamar Blinks, MD    Chief Complaint: Follow-up (Pt here for f/u visit. Pt has a yeast infection from taking antibiotics. )   History of Present Illness:  Alicia [name] is a 55 y.o. very pleasant female patient who presents with the following:  Seen by myself on 9/6 to establish care- history of congenital hepatic fibrosis which had been quiet for years but made her quite ill in June of this year.  Follow-up plan from last visit Received her labs and called to check on her. Her ammonia is a bit higher.  However, she continues to feel well.  Called and discussed with doc on call for her hepatologist at [facility]; clinical picture is more important than her ammonia level. Her Na is a bit low- will have her restrict fluid to less than 2 quarts daily.  Will need to recheck with myself of her GI doc next week.   She is taking lactulose 15 BID- having BM daily but not large amounts.  She will keep her fluids to under 2 quarts daily.  She will report any change in her condition or weight gain in the meantime  Since our last visit she had 2 office visits and an ER visit for cellulitis of her left leg- as of her last visit on 9/20 she was doing a lot better.  She has "no idea" what triggered the infection on her leg. She also had a CMP on 9/14 that showed normal sodium and her weight is stable Her leg is doing well today, no further fevers.  She has not noted a fever at home in several days.  She does feel tired, but this is not unusual for her.   The leg is no longer painful- her soreness is almost 100% gone.   Wt Readings from Last 3 Encounters:  06/19/16 158 lb 3.2 oz (71.8 kg)  06/14/16 157 lb 4 oz (71.3 kg)  06/13/16 156 lb (70.8 kg)  on 9/7 she was 155 lb.  She feels that her  weight gain is due to fluid in her legs She feels like her thinking and mentation is good, but she may be hypersensitive to forgetting things as she is afraid she will get encephalopathic again  She has noted a vaginal yeast infection- this is very typical for her following use of abx.   She is using an OTC remedy for this which does seem to be helping She notes hand/ foot/ leg cramps at night- she is using calcium and vitamin d on the advice of her liver doc to try and reduce that  She is still taking her lasix and spiro- she does use compression stockings some of the time but does not find them to be comfortable  Patient Active Problem List   Diagnosis Date Noted  . Multiple pulmonary nodules determined by computed tomography of lung   . Chronic cough 09/07/2015  . 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 Asthma with chronic obstructive pulmonary disease (COPD) ([place]) 09/04/2011  . 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 GERD (gastroesophageal reflux disease) 07/04/2011  . Sarcoid ([place]) 07/01/2011  . Asthma, severe persistent, with upper airway instability 06/13/2011  . 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  Allergy   . Anemia   . Asthma   . Hepatic fibrosis ([place])   . Sarcoidosis [facility])     Past Surgical History:  Procedure Laterality Date  . SPINAL FUSION  12/2007   Double spinal Fusion  . VIDEO BRONCHOSCOPY Bilateral 12/08/2015   Procedure: VIDEO BRONCHOSCOPY WITH FLUORO;  Surgeon: Rigoberto Noel, MD;  Location: [facility];  Service: Cardiopulmonary;  Laterality: Bilateral;    Social History  Substance Use Topics  . Smoking status: Never Smoker  . Smokeless tobacco: Never Used  . Alcohol use Yes     Comment: wine nightly    Family History  Problem Relation Age of Onset  . Arthritis Mother   . Heart disease Father     Triple bypass  . Heart attack Paternal Grandmother   . Stroke Paternal Grandfather   . Liver disease Sister      Juvenile cysitc liver disease  . Asthma Sister     Allergies  Allergen Reactions  . Aspirin Other (See Comments)    Causes Asthma  . Dairy Aid [Lactase]     Causes Asthma attacks  . Eggs Or Egg-Derived Products     Causes Asthma  . Azithromycin Nausea And Vomiting  . Chocolate     Asthma attack, hives  . Corn Oil     CORN ONLY Other reaction(s): Other (See Comments) Unknown  . Gabapentin Other (See Comments)    Blisters in mouth  . Peanut-Containing Drug Products     Asthma, eye swelling, itchy eyes, can't catch her breath  . Wheat     Asthma, hives    Medication list has been reviewed and updated.  Current Outpatient Prescriptions on File Prior to Visit  Medication Sig Dispense Refill  . benzonatate (TESSALON) 100 MG capsule TAKE 1 CAPSULE (100 MG TOTAL) BY MOUTH EVERY 8 (EIGHT) HOURS AS NEEDED. 30 capsule 3  . cephALEXin (KEFLEX) 500 MG capsule Take 1 capsule (500 mg total) by mouth 4 (four) times daily. 14 capsule 0  . furosemide (LASIX) 80 MG tablet Take 80 mg by mouth daily.    Marland Kitchen KLOR-CON M20 20 MEQ tablet Take 1 tablet by mouth daily. Reported on 11/09/2015  10  . mometasone-formoterol (DULERA) 200-5 MCG/ACT AERO INHALE 2 PUFFS BY MOUTH TWO TIMES DAILY. 1 Inhaler 5  . pantoprazole (PROTONIX) 40 MG tablet Take 1 tablet by mouth 2 (two) times daily.     Marland Kitchen PROAIR HFA 108 (90 Base) MCG/ACT inhaler INHALE 2 PUFFS EVERY FOUR (4) HOURS AS NEEDED FOR WHEEZING. 8.5 Inhaler 0  . ranitidine (ZANTAC) 300 MG capsule Take 1 capsule (300 mg total) by mouth at bedtime. 90 capsule 1  . Spacer/Aero-Holding Chambers (AEROCHAMBER MV) inhaler Use as instructed 1 each 0  . spironolactone (ALDACTONE) 100 MG tablet Take 200 mg by mouth daily.     No current facility-administered medications on file prior to visit.     Review of Systems:  As per HPI- otherwise negative. She feels fatigued. She is still going to work full time Seeing her liver doc on 9/28, and endocrinology at [facility] next  week also on 9/29   Physical Examination: Vitals:   06/19/16 1311  BP: 112/70  Pulse: 94  Temp: 99 F (37.2 C)   Vitals:   06/19/16 1311  Weight: 158 lb 3.2 oz (71.8 kg)  Height: 5\' 3"  (1.6 m)   Body mass index is 28.02 kg/m. Ideal Body Weight: Weight in (lb) to have BMI = 25: 140.8  GEN: WDWN, NAD, Non-toxic, A & O x 3, looks well HEENT: Atraumatic, Normocephalic. Neck supple. No masses, No LAD. Ears and Nose: No external deformity. CV: RRR, No M/G/R. No JVD. No thrill. No extra heart sounds. PULM: CTA B, no wheezes, crackles, rhonchi. No retractions. No resp. distress. No accessory muscle use. EXTR: No c/c.  She does have soft edema of both feet and ankles.  This does not appear to be chronic edema as the skin is soft and normal, not woody and there is no hyperpigmentation NEURO Normal gait.  Her left leg no longer shows any redness, tenderness or other sign of cellulitis. It appears normal  PSYCH: Normally interactive. Conversant. Not depressed or anxious appearing.  Calm demeanor.    Assessment and Plan: Cellulitis of left thigh  Congenital hepatic fibrosis  Hyponatremia  Bilateral edema of lower extremity  Here edema is likely due to her hepatic fibrosis Cellulitis is resolved Defer labs today as she is seeing her hepatologist next week.  Discussed strategies for dealing with her lower extremity edema such as elevating her legs and trying athletic style compression socks She will carefully monitor her weight and condition and will seek help if continuing to gain fluid or if not feeling well    Signed Lamar Blinks, MD

## 2016-06-19 NOTE — Patient Instructions (Addendum)
Try some of the athletic style compression socks instead of the medical stockings- these may be more comfortable for you.  I like the Go2 brand that you can get on [email or web address] Keep you eye out for the egg free flu shot.   Let me know if the vaginal yeast infection does not clear up!   Let me know if you need any follow-up from your hepatologist visit and let's plan to see each other in about 4 months

## 2016-06-21 ENCOUNTER — Other Ambulatory Visit: Payer: Self-pay | Admitting: Pulmonary Disease

## 2016-06-24 ENCOUNTER — Telehealth: Payer: Self-pay | Admitting: Family Medicine

## 2016-06-24 NOTE — Telephone Encounter (Signed)
Patient Relation: Self Patient [id number]  Patient is seeing Dr. March Rummage at [facility] and they have ordered labs. She is wondering if she would be able to start having her labs done at our office, rather than driving all around to find a lab. She is going to be needing labs every few months.  This is one of the reasons she established care with [place]. Could Dr. Lorelei Pont order the labs and share with [facility]? Or is there a way we would be able to help her with this? Please advise.

## 2016-06-25 ENCOUNTER — Encounter: Payer: Self-pay | Admitting: Family Medicine

## 2016-06-25 DIAGNOSIS — E2740 Unspecified adrenocortical insufficiency: Secondary | ICD-10-CM

## 2016-06-25 NOTE — Telephone Encounter (Signed)
My chart message; sure, just let me know what labs she needs

## 2016-06-26 NOTE — Telephone Encounter (Signed)
Ordered labs- results need to be faxed to [facility] endocrinology attn Baxter Kail, fax [phone]

## 2016-06-27 ENCOUNTER — Emergency Department (HOSPITAL_BASED_OUTPATIENT_CLINIC_OR_DEPARTMENT_OTHER)
Admission: EM | Admit: 2016-06-27 | Discharge: 2016-06-27 | Disposition: A | Discharge status: Home / Self Care | Payer: BLUE CROSS/BLUE SHIELD | Source: Home / Self Care | Attending: Emergency Medicine | Admitting: Emergency Medicine

## 2016-06-27 ENCOUNTER — Encounter (HOSPITAL_BASED_OUTPATIENT_CLINIC_OR_DEPARTMENT_OTHER): Payer: Self-pay | Admitting: *Deleted

## 2016-06-27 ENCOUNTER — Emergency Department (HOSPITAL_BASED_OUTPATIENT_CLINIC_OR_DEPARTMENT_OTHER): Payer: BLUE CROSS/BLUE SHIELD

## 2016-06-27 DIAGNOSIS — Z79899 Other long term (current) drug therapy: Secondary | ICD-10-CM | POA: Insufficient documentation

## 2016-06-27 DIAGNOSIS — I10 Essential (primary) hypertension: Secondary | ICD-10-CM | POA: Insufficient documentation

## 2016-06-27 DIAGNOSIS — J45909 Unspecified asthma, uncomplicated: Secondary | ICD-10-CM | POA: Insufficient documentation

## 2016-06-27 DIAGNOSIS — Z23 Encounter for immunization: Secondary | ICD-10-CM | POA: Insufficient documentation

## 2016-06-27 DIAGNOSIS — L03115 Cellulitis of right lower limb: Secondary | ICD-10-CM | POA: Diagnosis not present

## 2016-06-27 DIAGNOSIS — L0390 Cellulitis, unspecified: Secondary | ICD-10-CM

## 2016-06-27 LAB — CBC WITH DIFFERENTIAL/PLATELET
BASOS ABS: 0 10*3/uL (ref 0.0–0.1)
Basophils Relative: 0 %
EOS PCT: 1 %
Eosinophils Absolute: 0.1 10*3/uL (ref 0.0–0.7)
HEMATOCRIT: 37.7 % (ref 36.0–46.0)
Hemoglobin: 13 g/dL (ref 12.0–15.0)
LYMPHS PCT: 8 %
Lymphs Abs: 0.8 10*3/uL (ref 0.7–4.0)
MCH: 35.2 pg — ABNORMAL HIGH (ref 26.0–34.0)
MCHC: 34.5 g/dL (ref 30.0–36.0)
MCV: 102.2 fL — AB (ref 78.0–100.0)
Monocytes Absolute: 0.8 10*3/uL (ref 0.1–1.0)
Monocytes Relative: 8 %
NEUTROS ABS: 8.1 10*3/uL — AB (ref 1.7–7.7)
NEUTROS PCT: 83 %
PLATELETS: 65 10*3/uL — AB (ref 150–400)
RBC: 3.69 MIL/uL — AB (ref 3.87–5.11)
RDW: 14.7 % (ref 11.5–15.5)
WBC: 9.8 10*3/uL (ref 4.0–10.5)

## 2016-06-27 LAB — COMPREHENSIVE METABOLIC PANEL
ALT: 50 U/L (ref 14–54)
AST: 57 U/L — AB (ref 15–41)
Albumin: 3 g/dL — ABNORMAL LOW (ref 3.5–5.0)
Alkaline Phosphatase: 134 U/L — ABNORMAL HIGH (ref 38–126)
Anion gap: 6 (ref 5–15)
BUN: 15 mg/dL (ref 6–20)
CHLORIDE: 106 mmol/L (ref 101–111)
CO2: 25 mmol/L (ref 22–32)
Calcium: 8.4 mg/dL — ABNORMAL LOW (ref 8.9–10.3)
Creatinine, Ser: 1.06 mg/dL — ABNORMAL HIGH (ref 0.44–1.00)
GFR calc Af Amer: >60 mL/min (ref >60)
GFR, EST NON AFRICAN AMERICAN: 57 mL/min — AB (ref >60)
Glucose, Bld: 201 mg/dL — ABNORMAL HIGH (ref 65–99)
POTASSIUM: 3.9 mmol/L (ref 3.5–5.1)
SODIUM: 137 mmol/L (ref 135–145)
TOTAL PROTEIN: 5.7 g/dL — AB (ref 6.5–8.1)
Total Bilirubin: 4.5 mg/dL — ABNORMAL HIGH (ref 0.3–1.2)

## 2016-06-27 LAB — I-STAT CG4 LACTIC ACID, ED: LACTIC ACID, VENOUS: 1.74 mmol/L (ref 0.5–1.9)

## 2016-06-27 MED ORDER — VANCOMYCIN HCL IN DEXTROSE 1-5 GM/200ML-% IV SOLN
1000.0000 mg | Freq: Once | INTRAVENOUS | Status: AC
  Administered 2016-06-27: 1000 mg via INTRAVENOUS
  Filled 2016-06-27: qty 200

## 2016-06-27 MED ORDER — SODIUM CHLORIDE 0.9 % IV BOLUS (SEPSIS)
1000.0000 mL | Freq: Once | INTRAVENOUS | Status: AC
  Administered 2016-06-27: 1000 mL via INTRAVENOUS

## 2016-06-27 MED ORDER — IBUPROFEN 200 MG PO TABS
600.0000 mg | ORAL_TABLET | Freq: Once | ORAL | Status: AC
  Administered 2016-06-27: 600 mg via ORAL
  Filled 2016-06-27: qty 1

## 2016-06-27 MED ORDER — TETANUS-DIPHTH-ACELL PERTUSSIS 5-2.5-18.5 LF-MCG/0.5 IM SUSP
0.5000 mL | Freq: Once | INTRAMUSCULAR | Status: AC
  Administered 2016-06-27: 0.5 mL via INTRAMUSCULAR
  Filled 2016-06-27: qty 0.5

## 2016-06-27 MED ORDER — DOXYCYCLINE HYCLATE 100 MG PO CAPS: 100.0000 mg | ORAL_CAPSULE | Freq: Two times a day (BID) | ORAL | 0 refills | Status: DC

## 2016-06-27 MED ORDER — VANCOMYCIN HCL IN DEXTROSE 1-5 GM/200ML-% IV SOLN
INTRAVENOUS | Status: AC
  Filled 2016-06-27: qty 200

## 2016-06-27 MED FILL — DOXYCYCLINE HYC 100 MG CAP: 100 | 10 days supply | Qty: 20 | Fill #0

## 2016-06-27 NOTE — ED Notes (Signed)
Patient is resting comfortably. 

## 2016-06-27 NOTE — Telephone Encounter (Signed)
Called pt to find out which labs she will need. Pt states that she has already spoke with Dr. Lorelei Pont. I was able to verify that future labs have been ordered.

## 2016-06-27 NOTE — ED Triage Notes (Signed)
Pt reports abrasion to her lle on Sunday, yesterday redness and swelling noted. Pt states she just finished anitbiotics for cellulitis in her right leg.

## 2016-06-27 NOTE — ED Provider Notes (Signed)
[facility] DEPT Provider Note   CSN: [id number] Arrival date & time: 06/27/16  0841     History   Chief Complaint Chief Complaint  Patient presents with  . Leg Pain    HPI Alicia [name] is a 55 y.o. female.  HPI   Alicia [name] is a 55 y.o. female, with a history of recent lower extremity cellulitis, presenting to the ED with right lower leg pain, swelling, and redness first noticed yesterday. Pt states she was scratched by a plant while working in her garden four days ago. Tmax last night was 99.7. Last ibuprofen was this morning at 6 AM. Patient was recently treated with Keflex and subsequent IV Rocephin on 9/14 for a cellulitis on her left thigh. Tetanus not up to date. Denies neuro deficits, falls, or any other injuries or complaints.    Past Medical History:  Diagnosis Date  . Allergy   . Anemia   . Asthma   . Hepatic fibrosis ([place])   . Sarcoidosis [facility])     Patient Active Problem List   Diagnosis Date Noted  . Multiple pulmonary nodules determined by computed tomography of lung   . Chronic cough 09/07/2015  . Hypertension, portal ([place]) 09/04/2011  . Asthma with chronic obstructive pulmonary disease (COPD) ([place]) 09/04/2011  . 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 GERD (gastroesophageal reflux disease) 07/04/2011  . Sarcoid ([place]) 07/01/2011  . Asthma, severe persistent, with upper airway instability 06/13/2011  . 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 SPINAL FUSION  12/2007   Double spinal Fusion  . VIDEO BRONCHOSCOPY Bilateral 12/08/2015   Procedure: VIDEO BRONCHOSCOPY WITH FLUORO;  Surgeon: Rigoberto Noel, MD;  Location: [facility];  Service: Cardiopulmonary;  Laterality: Bilateral;    OB History    No data available       Home Medications    Prior to Admission medications   Medication Sig Start Date End Date Taking? Authorizing Provider  benzonatate (TESSALON) 100 MG capsule TAKE 1 CAPSULE  (100 MG TOTAL) BY MOUTH EVERY 8 (EIGHT) HOURS AS NEEDED. 04/09/16   Rigoberto Noel, MD  cephALEXin (KEFLEX) 500 MG capsule Take 1 capsule (500 mg total) by mouth 4 (four) times daily. 06/13/16   Veatrice Bourbon, MD  doxycycline (VIBRAMYCIN) 100 MG capsule Take 1 capsule (100 mg total) by mouth 2 (two) times daily. 06/27/16   Aryanah Enslow C Ira Dougher, PA-C  furosemide (LASIX) 80 MG tablet Take 80 mg by mouth daily.    Historical Provider, MD  KLOR-CON M20 20 MEQ tablet Take 1 tablet by mouth daily. Reported on 11/09/2015 09/01/14   Historical Provider, MD  mometasone-formoterol (DULERA) 200-5 MCG/ACT AERO INHALE 2 PUFFS BY MOUTH TWO TIMES DAILY. 01/09/16   Rigoberto Noel, MD  pantoprazole (PROTONIX) 40 MG tablet Take 1 tablet by mouth 2 (two) times daily.  04/05/11   Historical Provider, MD  PROAIR HFA 108 (90 Base) MCG/ACT inhaler INHALE 2 PUFFS EVERY FOUR (4) HOURS AS NEEDED FOR WHEEZING. 11/10/15   Rigoberto Noel, MD  ranitidine (ZANTAC) 300 MG tablet TAKE 1 TABLET AT BEDTIME 06/26/16   Rigoberto Noel, MD  Spacer/Aero-Holding Chambers (AEROCHAMBER MV) inhaler Use as instructed 09/08/14   Elsie Stain, MD  spironolactone (ALDACTONE) 100 MG tablet Take 200 mg by mouth daily.    Historical Provider, MD    Family History Family History  Problem Relation Age of Onset  . Arthritis Mother   . Heart  disease Father     Triple bypass  . Migraines Father   . Heart attack Paternal Grandmother   . Stroke Paternal Grandfather   . Liver disease Sister     Juvenile cysitc liver disease  . Asthma Sister     Social History Social History  Substance Use Topics  . Smoking status: Never Smoker  . Smokeless tobacco: Never Used  . Alcohol use Yes     Comment: wine nightly     Allergies   Aspirin; Dairy aid [lactase]; Eggs or egg-derived products; Azithromycin; Chocolate; Corn oil; Gabapentin; Peanut-containing drug products; and Wheat   Review of Systems Review of Systems  Constitutional: Positive for chills and fever.   Gastrointestinal: Negative for nausea and vomiting.  Musculoskeletal: Positive for myalgias.  Skin: Positive for color change and wound.  All other systems reviewed and are negative.    Physical Exam Updated Vital Signs BP 128/71 (BP Location: Right Arm)   Pulse 96   Temp 98.2 F (36.8 C) (Oral)   Resp 20   Ht 5\' 3"  (1.6 m)   Wt 70.3 kg   SpO2 97%   BMI 27.46 kg/m   Physical Exam  Constitutional: She appears well-developed and well-nourished. No distress.  HENT:  Head: Normocephalic and atraumatic.  Eyes: Conjunctivae are normal.  Neck: Neck supple.  Cardiovascular: Normal rate, regular rhythm, normal heart sounds and intact distal pulses.   Pulmonary/Chest: Effort normal and breath sounds normal. No respiratory distress.  Abdominal: Soft. There is no tenderness. There is no guarding.  Musculoskeletal: She exhibits edema and tenderness.  Swelling, erythema, and tenderness to the right lower leg. Weeping wound noted to the right anterior lower leg.  Lymphadenopathy:    She has no cervical adenopathy.  Neurological: She is alert.  Skin: Skin is warm and dry. She is not diaphoretic.  Psychiatric: She has a normal mood and affect. Her behavior is normal.  Nursing note and vitals reviewed.        ED Treatments / Results  Labs (all labs ordered are listed, but only abnormal results are displayed) Labs Reviewed  COMPREHENSIVE METABOLIC PANEL - Abnormal; Notable for the following:       Result Value   Glucose, Bld 201 (*)    Creatinine, Ser 1.06 (*)    Calcium 8.4 (*)    Total Protein 5.7 (*)    Albumin 3.0 (*)    AST 57 (*)    Alkaline Phosphatase 134 (*)    Total Bilirubin 4.5 (*)    GFR calc non Af Amer 57 (*)    All other components within normal limits  CBC WITH DIFFERENTIAL/PLATELET - Abnormal; Notable for the following:    RBC 3.69 (*)    MCV 102.2 (*)    MCH 35.2 (*)    Platelets 65 (*)    Neutro Abs 8.1 (*)    All other components within normal  limits  I-STAT CG4 LACTIC ACID, ED    EKG  EKG Interpretation None       Radiology Dg Tibia/fibula Right  Result Date: 06/27/2016 CLINICAL DATA:  55 year old female with abrasion to right distal tib-fib 3 days ago with increased pain redness and swelling. Chronic liver disease. Initial encounter. EXAM: RIGHT TIBIA AND FIBULA - 2 VIEW COMPARISON:  None. FINDINGS: Soft tissue swelling and stranding about the right tib-fib. No subcutaneous gas. No radiopaque foreign body identified. No acute osseous abnormality identified. Alignment at the right knee and ankle is preserved. No knee or  ankle joint effusion is evident. IMPRESSION: Soft tissue swelling/inflammation. No acute osseous abnormality identified. Electronically Signed   By: Genevie Ann M.D.   On: 06/27/2016 10:21    Procedures Procedures (including critical care time)  Medications Ordered in ED Medications  sodium chloride 0.9 % bolus 1,000 mL (0 mLs Intravenous Stopped 06/27/16 1314)  Tdap (BOOSTRIX) injection 0.5 mL (0.5 mLs Intramuscular Given 06/27/16 1054)  vancomycin (VANCOCIN) IVPB 1000 mg/200 mL premix (0 mg Intravenous Stopped 06/27/16 1310)  ibuprofen (ADVIL,MOTRIN) tablet 600 mg (600 mg Oral Given 06/27/16 1053)     Initial Impression / Assessment and Plan / ED Course  I have reviewed the triage vital signs and the nursing notes.  Pertinent labs & imaging results that were available during my care of the patient were reviewed by me and considered in my medical decision making (see chart for details).  Clinical Course    Patient presents with a lower right extremity cellulitis due to a wound sustained 4 days ago. Patient's area of infection appears to be isolated to the lower right leg. Patient is nontoxic appearing, afebrile, not tachycardic, not tachypneic, not hypotensive, maintains SPO2 of 100% on room air, and is in no apparent distress. Patient has no signs of sepsis or other serious or life-threatening condition.  Thrombocytopenia noted, but also noted to be consistent with previous values. Other lab abnormalities are consistent with, or improvements of, previous values. Patient appears appropriate for outpatient therapy at this time. Strict return precautions discussed. Patient to follow up with her PCP for a wound check in the next few days.  Vitals:   06/27/16 0848 06/27/16 1115 06/27/16 1313  BP: 128/71 (!) 112/53 99/55  Pulse: 96 86 85  Resp: 20 18 16   Temp: 98.2 F (36.8 C) 99.3 F (37.4 C) 98.3 F (36.8 C)  TempSrc: Oral Oral Oral  SpO2: 97% 100% 100%  Weight: 70.3 kg    Height: 5\' 3"  (1.6 m)         Final Clinical Impressions(s) / ED Diagnoses   Final diagnoses:  Cellulitis  Cellulitis of right lower extremity    New Prescriptions Discharge Medication List as of 06/27/2016 12:56 PM    START taking these medications   Details  doxycycline (VIBRAMYCIN) 100 MG capsule Take 1 capsule (100 mg total) by mouth 2 (two) times daily., Starting Thu 06/27/2016, [place], PA-C 06/28/16 0945    Fredia Sorrow, MD 06/29/16 [phone]
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
## 2016-06-27 NOTE — Discharge Instructions (Signed)
There were signs of a skin infection, cellulitis on your exam today. You were given a course of IV antibiotics. You will be sent home with a course of oral antibiotics. Please take all of your antibiotics until finished!   You may develop abdominal discomfort or diarrhea from the antibiotic.  You may help offset this with probiotics which you can buy or get in yogurt. Do not eat or take the probiotics until 2 hours after your antibiotic.   Ibuprofen or naproxen for pain.  Should your symptoms worsen, or you develop fever, vomiting, or other major concerns, proceed immediately to [facility], as you may have to be admitted.

## 2016-06-28 ENCOUNTER — Encounter (HOSPITAL_COMMUNITY): Payer: Self-pay | Admitting: Oncology

## 2016-06-28 ENCOUNTER — Inpatient Hospital Stay (HOSPITAL_COMMUNITY)
Admission: EM | Admit: 2016-06-28 | Discharge: 2016-07-01 | DRG: 603 | Disposition: A | Discharge status: Home / Self Care | Payer: BLUE CROSS/BLUE SHIELD | Attending: Nephrology | Admitting: Nephrology

## 2016-06-28 DIAGNOSIS — Z91010 Allergy to peanuts: Secondary | ICD-10-CM

## 2016-06-28 DIAGNOSIS — Z8261 Family history of arthritis: Secondary | ICD-10-CM

## 2016-06-28 DIAGNOSIS — J449 Chronic obstructive pulmonary disease, unspecified: Secondary | ICD-10-CM | POA: Diagnosis present

## 2016-06-28 DIAGNOSIS — J4550 Severe persistent asthma, uncomplicated: Secondary | ICD-10-CM | POA: Diagnosis present

## 2016-06-28 DIAGNOSIS — N179 Acute kidney failure, unspecified: Secondary | ICD-10-CM | POA: Diagnosis present

## 2016-06-28 DIAGNOSIS — D869 Sarcoidosis, unspecified: Secondary | ICD-10-CM | POA: Diagnosis present

## 2016-06-28 DIAGNOSIS — K740 Hepatic fibrosis: Secondary | ICD-10-CM | POA: Diagnosis present

## 2016-06-28 DIAGNOSIS — Z91012 Allergy to eggs: Secondary | ICD-10-CM

## 2016-06-28 DIAGNOSIS — Z981 Arthrodesis status: Secondary | ICD-10-CM

## 2016-06-28 DIAGNOSIS — Z79899 Other long term (current) drug therapy: Secondary | ICD-10-CM

## 2016-06-28 DIAGNOSIS — L03115 Cellulitis of right lower limb: Principal | ICD-10-CM | POA: Diagnosis present

## 2016-06-28 DIAGNOSIS — L0390 Cellulitis, unspecified: Secondary | ICD-10-CM | POA: Diagnosis present

## 2016-06-28 DIAGNOSIS — Z91018 Allergy to other foods: Secondary | ICD-10-CM

## 2016-06-28 DIAGNOSIS — Z8249 Family history of ischemic heart disease and other diseases of the circulatory system: Secondary | ICD-10-CM

## 2016-06-28 DIAGNOSIS — K7460 Unspecified cirrhosis of liver: Secondary | ICD-10-CM | POA: Diagnosis present

## 2016-06-28 DIAGNOSIS — Z823 Family history of stroke: Secondary | ICD-10-CM

## 2016-06-28 DIAGNOSIS — Z888 Allergy status to other drugs, medicaments and biological substances status: Secondary | ICD-10-CM

## 2016-06-28 DIAGNOSIS — Z91011 Allergy to milk products: Secondary | ICD-10-CM

## 2016-06-28 DIAGNOSIS — K219 Gastro-esophageal reflux disease without esophagitis: Secondary | ICD-10-CM | POA: Diagnosis present

## 2016-06-28 DIAGNOSIS — D696 Thrombocytopenia, unspecified: Secondary | ICD-10-CM | POA: Diagnosis present

## 2016-06-28 LAB — CBC WITH DIFFERENTIAL/PLATELET
Basophils Absolute: 0 10*3/uL (ref 0.0–0.1)
Basophils Relative: 0 %
EOS PCT: 1 %
Eosinophils Absolute: 0.1 10*3/uL (ref 0.0–0.7)
HCT: 37.4 % (ref 36.0–46.0)
Hemoglobin: 12.9 g/dL (ref 12.0–15.0)
LYMPHS ABS: 1 10*3/uL (ref 0.7–4.0)
LYMPHS PCT: 12 %
MCH: 34.7 pg — AB (ref 26.0–34.0)
MCHC: 34.5 g/dL (ref 30.0–36.0)
MCV: 100.5 fL — AB (ref 78.0–100.0)
MONO ABS: 1.1 10*3/uL — AB (ref 0.1–1.0)
Monocytes Relative: 13 %
Neutro Abs: 6.3 10*3/uL (ref 1.7–7.7)
Neutrophils Relative %: 74 %
PLATELETS: 90 10*3/uL — AB (ref 150–400)
RBC: 3.72 MIL/uL — ABNORMAL LOW (ref 3.87–5.11)
RDW: 14.4 % (ref 11.5–15.5)
WBC: 8.4 10*3/uL (ref 4.0–10.5)

## 2016-06-28 LAB — BASIC METABOLIC PANEL
Anion gap: 7 (ref 5–15)
BUN: 14 mg/dL (ref 6–20)
CALCIUM: 8.7 mg/dL — AB (ref 8.9–10.3)
CO2: 24 mmol/L (ref 22–32)
Chloride: 107 mmol/L (ref 101–111)
Creatinine, Ser: 1.32 mg/dL — ABNORMAL HIGH (ref 0.44–1.00)
GFR calc Af Amer: 51 mL/min — ABNORMAL LOW (ref >60)
GFR, EST NON AFRICAN AMERICAN: 44 mL/min — AB (ref >60)
GLUCOSE: 135 mg/dL — AB (ref 65–99)
POTASSIUM: 4.2 mmol/L (ref 3.5–5.1)
Sodium: 138 mmol/L (ref 135–145)

## 2016-06-28 LAB — I-STAT CG4 LACTIC ACID, ED: Lactic Acid, Venous: 1.16 mmol/L (ref 0.5–1.9)

## 2016-06-28 MED ORDER — SODIUM CHLORIDE 0.9 % IV SOLN
INTRAVENOUS | Status: DC
  Administered 2016-06-28: 23:00:00 via INTRAVENOUS

## 2016-06-28 MED ORDER — CEFAZOLIN IN D5W 1 GM/50ML IV SOLN
1.0000 g | Freq: Once | INTRAVENOUS | Status: AC
  Administered 2016-06-28: 1 g via INTRAVENOUS
  Filled 2016-06-28: qty 50

## 2016-06-28 MED ORDER — SODIUM CHLORIDE 0.9 % IV BOLUS (SEPSIS)
1000.0000 mL | Freq: Once | INTRAVENOUS | Status: AC
  Administered 2016-06-28: 1000 mL via INTRAVENOUS
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
## 2016-06-28 NOTE — ED Notes (Signed)
Bed: [facility]4 Expected date:  Expected time:  Means of arrival:  Comments: 

## 2016-06-28 NOTE — ED Triage Notes (Signed)
Pt seen yesterday at [facility] for cellulitis of RLE.  Pt was told to present to the ED if the redness extended outside marking.  New area of redness marked and dated.  Pt states pain is a 5/10 when ambulating.  Pt ambulating to triage, stopped and wheelchair gotten for pt.

## 2016-06-29 ENCOUNTER — Observation Stay (HOSPITAL_COMMUNITY): Payer: BLUE CROSS/BLUE SHIELD

## 2016-06-29 DIAGNOSIS — K219 Gastro-esophageal reflux disease without esophagitis: Secondary | ICD-10-CM | POA: Diagnosis present

## 2016-06-29 DIAGNOSIS — M7989 Other specified soft tissue disorders: Secondary | ICD-10-CM

## 2016-06-29 DIAGNOSIS — K740 Hepatic fibrosis: Secondary | ICD-10-CM | POA: Diagnosis present

## 2016-06-29 DIAGNOSIS — Z823 Family history of stroke: Secondary | ICD-10-CM | POA: Diagnosis not present

## 2016-06-29 DIAGNOSIS — J449 Chronic obstructive pulmonary disease, unspecified: Secondary | ICD-10-CM | POA: Diagnosis present

## 2016-06-29 DIAGNOSIS — J4550 Severe persistent asthma, uncomplicated: Secondary | ICD-10-CM | POA: Diagnosis not present

## 2016-06-29 DIAGNOSIS — L03115 Cellulitis of right lower limb: Secondary | ICD-10-CM | POA: Diagnosis present

## 2016-06-29 DIAGNOSIS — Z8249 Family history of ischemic heart disease and other diseases of the circulatory system: Secondary | ICD-10-CM | POA: Diagnosis not present

## 2016-06-29 DIAGNOSIS — Z8261 Family history of arthritis: Secondary | ICD-10-CM | POA: Diagnosis not present

## 2016-06-29 DIAGNOSIS — M79609 Pain in unspecified limb: Secondary | ICD-10-CM

## 2016-06-29 DIAGNOSIS — Z91010 Allergy to peanuts: Secondary | ICD-10-CM | POA: Diagnosis not present

## 2016-06-29 DIAGNOSIS — D696 Thrombocytopenia, unspecified: Secondary | ICD-10-CM | POA: Diagnosis present

## 2016-06-29 DIAGNOSIS — K7460 Unspecified cirrhosis of liver: Secondary | ICD-10-CM | POA: Diagnosis present

## 2016-06-29 DIAGNOSIS — D869 Sarcoidosis, unspecified: Secondary | ICD-10-CM | POA: Diagnosis present

## 2016-06-29 DIAGNOSIS — Z981 Arthrodesis status: Secondary | ICD-10-CM | POA: Diagnosis not present

## 2016-06-29 DIAGNOSIS — Z79899 Other long term (current) drug therapy: Secondary | ICD-10-CM | POA: Diagnosis not present

## 2016-06-29 DIAGNOSIS — Z91012 Allergy to eggs: Secondary | ICD-10-CM | POA: Diagnosis not present

## 2016-06-29 DIAGNOSIS — Z91018 Allergy to other foods: Secondary | ICD-10-CM | POA: Diagnosis not present

## 2016-06-29 DIAGNOSIS — N179 Acute kidney failure, unspecified: Secondary | ICD-10-CM | POA: Diagnosis present

## 2016-06-29 DIAGNOSIS — Z888 Allergy status to other drugs, medicaments and biological substances status: Secondary | ICD-10-CM | POA: Diagnosis not present

## 2016-06-29 DIAGNOSIS — Z91011 Allergy to milk products: Secondary | ICD-10-CM | POA: Diagnosis not present

## 2016-06-29 LAB — BASIC METABOLIC PANEL
Anion gap: 5 (ref 5–15)
BUN: 13 mg/dL (ref 6–20)
CHLORIDE: 111 mmol/L (ref 101–111)
CO2: 23 mmol/L (ref 22–32)
CREATININE: 1.18 mg/dL — AB (ref 0.44–1.00)
Calcium: 8 mg/dL — ABNORMAL LOW (ref 8.9–10.3)
GFR calc Af Amer: 58 mL/min — ABNORMAL LOW (ref >60)
GFR calc non Af Amer: 50 mL/min — ABNORMAL LOW (ref >60)
GLUCOSE: 133 mg/dL — AB (ref 65–99)
POTASSIUM: 3.8 mmol/L (ref 3.5–5.1)
SODIUM: 139 mmol/L (ref 135–145)

## 2016-06-29 LAB — CBC WITH DIFFERENTIAL/PLATELET
Basophils Absolute: 0 10*3/uL (ref 0.0–0.1)
Basophils Relative: 0 %
EOS ABS: 0.1 10*3/uL (ref 0.0–0.7)
Eosinophils Relative: 1 %
HEMATOCRIT: 31.1 % — AB (ref 36.0–46.0)
HEMOGLOBIN: 10.9 g/dL — AB (ref 12.0–15.0)
LYMPHS ABS: 0.9 10*3/uL (ref 0.7–4.0)
LYMPHS PCT: 17 %
MCH: 34.4 pg — AB (ref 26.0–34.0)
MCHC: 35 g/dL (ref 30.0–36.0)
MCV: 98.1 fL (ref 78.0–100.0)
MONOS PCT: 14 %
Monocytes Absolute: 0.7 10*3/uL (ref 0.1–1.0)
NEUTROS PCT: 68 %
Neutro Abs: 3.4 10*3/uL (ref 1.7–7.7)
Platelets: 72 10*3/uL — ABNORMAL LOW (ref 150–400)
RBC: 3.17 MIL/uL — AB (ref 3.87–5.11)
RDW: 14.2 % (ref 11.5–15.5)
WBC: 5 10*3/uL (ref 4.0–10.5)

## 2016-06-29 LAB — SEDIMENTATION RATE: Sed Rate: 11 mm/hr (ref 0–22)

## 2016-06-29 LAB — HIV ANTIBODY (ROUTINE TESTING W REFLEX): HIV Screen 4th Generation wRfx: NONREACTIVE

## 2016-06-29 LAB — MRSA PCR SCREENING: MRSA by PCR: NEGATIVE

## 2016-06-29 LAB — GLUCOSE, CAPILLARY: GLUCOSE-CAPILLARY: 199 mg/dL — AB (ref 65–99)

## 2016-06-29 LAB — C-REACTIVE PROTEIN: CRP: 4.5 mg/dL — ABNORMAL HIGH (ref <1.0)

## 2016-06-29 MED ORDER — POLYETHYLENE GLYCOL 3350 17 G PO PACK
17.0000 g | PACK | Freq: Every day | ORAL | Status: DC | PRN
Start: 2016-06-29 — End: 2016-07-01

## 2016-06-29 MED ORDER — ALBUTEROL SULFATE (2.5 MG/3ML) 0.083% IN NEBU: 2.5000 mg | INHALATION_SOLUTION | RESPIRATORY_TRACT | Status: DC | PRN

## 2016-06-29 MED ORDER — ACETAMINOPHEN 325 MG PO TABS
650.0000 mg | ORAL_TABLET | Freq: Four times a day (QID) | ORAL | Status: DC | PRN
  Administered 2016-06-29 – 2016-06-30 (×5): 650 mg via ORAL
  Filled 2016-06-29 (×5): qty 2

## 2016-06-29 MED ORDER — ALBUTEROL SULFATE HFA 108 (90 BASE) MCG/ACT IN AERS: 2.0000 | INHALATION_SPRAY | RESPIRATORY_TRACT | Status: DC | PRN

## 2016-06-29 MED ORDER — OXYCODONE HCL 5 MG PO TABS: 5.0000 mg | ORAL_TABLET | ORAL | Status: DC | PRN

## 2016-06-29 MED ORDER — VANCOMYCIN HCL IN DEXTROSE 1-5 GM/200ML-% IV SOLN
1000.0000 mg | Freq: Once | INTRAVENOUS | Status: AC
  Administered 2016-06-29: 1000 mg via INTRAVENOUS
  Filled 2016-06-29: qty 200

## 2016-06-29 MED ORDER — LACTULOSE 10 GM/15ML PO SOLN
20.0000 g | Freq: Two times a day (BID) | ORAL | Status: DC
  Administered 2016-06-29 – 2016-07-01 (×6): 20 g via ORAL
  Filled 2016-06-29 (×6): qty 30

## 2016-06-29 MED ORDER — MOMETASONE FURO-FORMOTEROL FUM 200-5 MCG/ACT IN AERO
2.0000 | INHALATION_SPRAY | Freq: Two times a day (BID) | RESPIRATORY_TRACT | Status: DC
  Administered 2016-06-29 – 2016-07-01 (×5): 2 via RESPIRATORY_TRACT
  Filled 2016-06-29: qty 8.8

## 2016-06-29 MED ORDER — PROMETHAZINE HCL 25 MG PO TABS: 12.5000 mg | ORAL_TABLET | Freq: Four times a day (QID) | ORAL | Status: DC | PRN

## 2016-06-29 MED ORDER — PANTOPRAZOLE SODIUM 40 MG PO TBEC
40.0000 mg | DELAYED_RELEASE_TABLET | Freq: Two times a day (BID) | ORAL | Status: DC
  Administered 2016-06-29 – 2016-07-01 (×6): 40 mg via ORAL
  Filled 2016-06-29 (×6): qty 1

## 2016-06-29 MED ORDER — ACETAMINOPHEN 650 MG RE SUPP: 650.0000 mg | Freq: Four times a day (QID) | RECTAL | Status: DC | PRN

## 2016-06-29 MED ORDER — VANCOMYCIN HCL IN DEXTROSE 1-5 GM/200ML-% IV SOLN
1000.0000 mg | Freq: Two times a day (BID) | INTRAVENOUS | Status: DC
  Filled 2016-06-29: qty 200

## 2016-06-29 MED ORDER — VANCOMYCIN HCL 10 G IV SOLR
1250.0000 mg | INTRAVENOUS | Status: DC
  Administered 2016-06-29 – 2016-06-30 (×2): 1250 mg via INTRAVENOUS
  Filled 2016-06-29 (×3): qty 1250

## 2016-06-29 MED ORDER — BENZONATATE 100 MG PO CAPS: 100.0000 mg | ORAL_CAPSULE | Freq: Three times a day (TID) | ORAL | Status: DC | PRN

## 2016-06-29 NOTE — H&P (Signed)
History and Physical    Alicia [name] [id number] DOB: [birth year] DOA: 06/28/2016  PCP: Lamar Blinks, MD   Patient coming from: Home  Chief Complaint: Right leg swelling, redness, pain  HPI: Alicia [name] is a 55 y.o. 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 Patient reports that she was gardening on 06/23/2016 and suffered an abrasion on her anterior right lower extremity caused by a branch. That night, she noted copious clear liquid drainage from the site and this continued for another 2 days. By 06/26/2016, the distal right lower extremity becomes swollen, red, and extremely tender. There also seemed to be a pustule forming adjacent to the initial abrasion. Patient has had chills and malaise with this, but no definite fever. She was evaluated at [facility] on 06/27/2016, given a dose of intravenous vancomycin, and discharged home with doxycycline. The area of erythema on the distal right leg was outlined with a skin pen during the visit to [facility] and erythema and edema has since extended beyond this. She recently was treated for left leg cellulitis with Rocephin and then Keflex and reports that this has resolved. Prior to that, she denies any personal history of skin or soft tissue infection, but notes that her son, who lives in their household, has recurrent skin infections with MRSA. Patient denies lightheadedness, change in vision or hearing, or focal numbness or weakness. She also denies chest pain, palpitations, dyspnea, or cough.   ED Course: Upon arrival to the ED, patient is found to be afebrile, saturating well on room air, and with vital signs stable. Chemistry panel features a serum creatinine 1.32, up from an apparent baseline of 1.0. CBC is notable for a chronic  thrombocytopenia with platelet count 90,000 which is better than recent priors. Lactic acid is reassuring at 1.16. Blood cultures were obtained, 1 L of normal saline was given, and patient was treated with empiric Ancef in the emergency department.  Given the patient's continued worsening despite appropriate outpatient antibiotics, and her chronic immunosuppression secondary to cirrhosis, she will be observed on the medical-surgical unit for treatment with IV antibiotics.  Review of Systems:  All other systems reviewed and apart from HPI, are negative.  Past Medical History:  Diagnosis Date  . Allergy   . Anemia   . Asthma   . Hepatic fibrosis ([place])   . Sarcoidosis [facility])     Past Surgical History:  Procedure Laterality Date  . SPINAL FUSION  12/2007   Double spinal Fusion  . VIDEO BRONCHOSCOPY Bilateral 12/08/2015   Procedure: VIDEO BRONCHOSCOPY WITH FLUORO;  Surgeon: Rigoberto Noel, MD;  Location: [facility];  Service: Cardiopulmonary;  Laterality: Bilateral;     reports that she has never smoked. She has never used smokeless tobacco. She reports that she drinks alcohol. She reports that she does not use drugs.  Allergies  Allergen Reactions  . Aspirin Other (See Comments)    Causes Asthma  . Dairy Aid [Lactase]     Causes Asthma attacks  . Eggs Or Egg-Derived Products     Causes Asthma  . Azithromycin Nausea And Vomiting  . Chocolate     Asthma attack, hives  . Corn Oil Other (See Comments)    Big doses--asthma attack --has integrated into diet  . Gabapentin Other (See Comments)    Blisters in mouth  .  Peanut-Containing Drug Products     Asthma, eye swelling, itchy eyes, can't catch her breath  . Wheat     Asthma, hivesBig doses--big doses -has integrated into diet    Family History  Problem Relation Age of Onset  . Arthritis Mother   . Heart disease Father     Triple bypass  . Migraines Father   . Heart attack Paternal Grandmother   . Stroke Paternal Grandfather    . Liver disease Sister     Juvenile cysitc liver disease  . Asthma Sister      Prior to Admission medications   Medication Sig Start Date End Date Taking? Authorizing Provider  acetaminophen (TYLENOL) 500 MG tablet Take 500 mg by mouth every 6 (six) hours as needed for moderate pain.   Yes Historical Provider, MD  benzonatate (TESSALON) 100 MG capsule TAKE 1 CAPSULE (100 MG TOTAL) BY MOUTH EVERY 8 (EIGHT) HOURS AS NEEDED. Patient taking differently: TAKE 1 CAPSULE (100 MG TOTAL) BY MOUTH EVERY 8 (EIGHT) HOURS AS NEEDED FOR COUGH. 04/09/16  Yes Rigoberto Noel, MD  CALCIUM-MAGNESIUM-VITAMIN D PO Take 1 tablet by mouth daily.   Yes Historical Provider, MD  doxycycline (VIBRAMYCIN) 100 MG capsule Take 1 capsule (100 mg total) by mouth 2 (two) times daily. 06/27/16  Yes Shawn C Joy, PA-C  furosemide (LASIX) 40 MG tablet Take 120 mg by mouth daily.   Yes Historical Provider, MD  KLOR-CON M20 20 MEQ tablet Take 1 tablet by mouth daily. Reported on 11/09/2015 09/01/14  Yes Historical Provider, MD  lactulose (CHRONULAC) 10 GM/15ML solution Take 20 g by mouth 2 (two) times daily. 06/27/16  Yes Historical Provider, MD  mometasone-formoterol (DULERA) 200-5 MCG/ACT AERO INHALE 2 PUFFS BY MOUTH TWO TIMES DAILY. 01/09/16  Yes Rigoberto Noel, MD  pantoprazole (PROTONIX) 40 MG tablet Take 1 tablet by mouth 2 (two) times daily.  04/05/11  Yes Historical Provider, MD  PROAIR HFA 108 (90 Base) MCG/ACT inhaler INHALE 2 PUFFS EVERY FOUR (4) HOURS AS NEEDED FOR WHEEZING. 11/10/15  Yes Rigoberto Noel, MD  ranitidine (ZANTAC) 300 MG tablet TAKE 1 TABLET AT BEDTIME 06/26/16  Yes Rigoberto Noel, MD  Spacer/Aero-Holding Chambers (AEROCHAMBER MV) inhaler Use as instructed 09/08/14  Yes Elsie Stain, MD  spironolactone (ALDACTONE) 100 MG tablet Take 300 mg by mouth daily.    Yes Historical Provider, MD  cephALEXin (KEFLEX) 500 MG capsule Take 1 capsule (500 mg total) by mouth 4 (four) times daily. Patient not taking: Reported on  06/28/2016 06/13/16   Veatrice Bourbon, MD    Physical Exam: Vitals:   06/28/16 2037 06/28/16 2318 06/28/16 2340  BP: 141/99 116/67 132/61  Pulse: 84 92 90  Resp: 20 18 18   Temp: 99.3 F (37.4 C)  98.6 F (37 C)  TempSrc: Oral  Oral  SpO2: 94% 99% 98%  Weight: 70.3 kg (155 lb)  72.3 kg (159 lb 6.3 oz)  Height: 5\' 3"  (1.6 m)        Constitutional: NAD, calm, comfortable Eyes: PERTLA, lids and conjunctivae normal ENMT: Mucous membranes are moist. Posterior pharynx clear of any exudate or lesions.   Neck: normal, supple, no masses, no thyromegaly Respiratory: clear to auscultation bilaterally, no wheezing, no crackles. Normal respiratory effort.    Cardiovascular: S1 & S2 heard, regular rate and rhythm. 2+ pedal pulses. No significant JVD. Abdomen: No distension, no tenderness, no masses palpated. Bowel sounds normal.  Musculoskeletal: no clubbing / cyanosis. Aside from RLE findings  below, no joint deformity upper and lower extremities. Normal muscle tone.  Skin: Scattered ecchymoses about b/l UE's. Distal RLE with erythema, edema, heat, and tenderness centered around a 1.5 cm wound at the anterior aspect. Otherwise, skin is warm, dry, well-perfused. Neurologic: CN 2-12 grossly intact. Sensation intact, DTR normal. Strength 5/5 in all 4 limbs.  Psychiatric: Normal judgment and insight. Alert and oriented x 3. Normal mood and affect.     Labs on Admission: I have personally reviewed following labs and imaging studies  CBC:  Recent Labs Lab 06/27/16 0950 06/28/16 2207  WBC 9.8 8.4  NEUTROABS 8.1* 6.3  HGB 13.0 12.9  HCT 37.7 37.4  MCV 102.2* 100.5*  PLT 65* 90*   Basic Metabolic Panel:  Recent Labs Lab 06/27/16 0950 06/28/16 2207  NA 137 138  K 3.9 4.2  CL 106 107  CO2 25 24  GLUCOSE 201* 135*  BUN 15 14  CREATININE 1.06* 1.32*  CALCIUM 8.4* 8.7*   GFR: Estimated Creatinine Clearance: 44.8 mL/min (by C-G formula based on SCr of 1.32 mg/dL (H)). Liver Function  Tests:  Recent Labs Lab 06/27/16 0950  AST 57*  ALT 50  ALKPHOS 134*  BILITOT 4.5*  PROT 5.7*  ALBUMIN 3.0*   No results for input(s): LIPASE, AMYLASE in the last 168 hours. No results for input(s): AMMONIA in the last 168 hours. Coagulation Profile: No results for input(s): INR, PROTIME in the last 168 hours. Cardiac Enzymes: No results for input(s): CKTOTAL, CKMB, CKMBINDEX, TROPONINI in the last 168 hours. BNP (last 3 results) No results for input(s): PROBNP in the last 8760 hours. HbA1C: No results for input(s): HGBA1C in the last 72 hours. CBG: No results for input(s): GLUCAP in the last 168 hours. Lipid Profile: No results for input(s): CHOL, HDL, LDLCALC, TRIG, CHOLHDL, LDLDIRECT in the last 72 hours. Thyroid Function Tests: No results for input(s): TSH, T4TOTAL, FREET4, T3FREE, THYROIDAB in the last 72 hours. Anemia Panel: No results for input(s): VITAMINB12, FOLATE, FERRITIN, TIBC, IRON, RETICCTPCT in the last 72 hours. Urine analysis:    Component Value Date/Time   COLORURINE YELLOW 05/29/2016 Arial 05/29/2016 1718   LABSPEC 1.009 05/29/2016 1718   PHURINE 6.5 05/29/2016 1718   GLUCOSEU 100 (A) 05/29/2016 1718   HGBUR NEGATIVE 05/29/2016 1718   BILIRUBINUR NEGATIVE 05/29/2016 1718   KETONESUR NEGATIVE 05/29/2016 1718   PROTEINUR NEGATIVE 05/29/2016 1718   NITRITE NEGATIVE 05/29/2016 1718   LEUKOCYTESUR NEGATIVE 05/29/2016 1718   Sepsis Labs: @LABRCNTIP (procalcitonin:4,lacticidven:4) )No results found for this or any previous visit (from the past 240 hour(s)).   Radiological Exams on Admission: Dg Tibia/fibula Right  Result Date: 06/27/2016 CLINICAL DATA:  55 year old female with abrasion to right distal tib-fib 3 days ago with increased pain redness and swelling. Chronic liver disease. Initial encounter. EXAM: RIGHT TIBIA AND FIBULA - 2 VIEW COMPARISON:  None. FINDINGS: Soft tissue swelling and stranding about the right tib-fib. No  subcutaneous gas. No radiopaque foreign body identified. No acute osseous abnormality identified. Alignment at the right knee and ankle is preserved. No knee or ankle joint effusion is evident. IMPRESSION: Soft tissue swelling/inflammation. No acute osseous abnormality identified. Electronically Signed   By: Genevie Ann M.D.   On: 06/27/2016 10:21    EKG: Not performed, will obtain as appropriate.   Assessment/Plan   1. Cellulitis of RLE  - Began following abrasion while gardening on 9/24 - Seen at [facility] on 9/27, given a dose of IV vancomycin, and discharged  home with doxycycline - Presents here with expansion of the involved area and increased pain  - No systemic infectious features  - She is immunocompromised by cirrhosis and has a son in the home with recurrent MRSA skin infections  - Blood culture incubating  - Radiographs from [facility] with no gas, radio-opaque foreign body, or underlying bony abnormality  - Started on empiric vancomycin   2. AKI - SCr 1.32 on admission, up from apparent baseline of 1.0 - Likely secondary to the infectious process  - Given a liter of NS in ED  - Hold diuretics for now, repeat chem panel in am   3. Asthma, severe persistent - Stable, no wheezing or distress  - Continue scheduled Dulera and prn albuterol    4. 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 GERD - No EGD report in EMR  - Managed with Protonix BID and Zantac qHS at home - Continue BID Protonix; Zantac is non-formulary and she reports allergy to an ingredient in Pepcid   6. Thrombocytopenia  - Platelets 90,000 on admission and improved from recent priors  - Likely secondary to cirrhosis; no s/s of active bleeding     DVT prophylaxis: SCD Code Status: Full    Family Communication: Husband updated at bedside Disposition Plan: Observe on med-surg Consults called: None Admission status: Observation     Vianne Bulls, MD [facility] Pager [phone]  If 7PM-7AM, please contact night-coverage [email or web address] Password [email or web address]  06/29/2016, 12:08 AM

## 2016-06-29 NOTE — Progress Notes (Signed)
Subjective: Patient admitted this morning, see detailed H&P by Dr Myna Hidalgo, in brief 55 year old female with a history of congenital hepatic fibrosis, persistent asthma, sarcoidosis presented with cellulitis of the right lower extremity.  Patient still complains of pain right lower extremity.  Vitals:   06/29/16 0511 06/29/16 1433  BP: (!) 106/53 (!) 103/56  Pulse: 78 86  Resp: 17 16  Temp: 98.7 F (37.1 C) 98.6 F (37 C)    Exam- marked erythema noted in the right lower extremity, positive tender to palpation  A/P Right lower extremity cellulitis Acute kidney injury Asthma Cirrhosis from congenital liver fibrosis Thrombocytopenia  Patient started on IV vancomycin, follow blood cultures    [facility] Pager- [phone]

## 2016-06-29 NOTE — Progress Notes (Signed)
Pharmacy Antibiotic Note  Alicia [name] is a 55 y.o. female admitted on 06/28/2016 with cellulitis.  Pharmacy has been consulted for Vancomycin dosing.  Plan: Vancomycin 1250mg  IV every 24 hours.  Goal trough 10-15 mcg/mL.  Height: 5\' 3"  (160 cm) Weight: 159 lb 6.3 oz (72.3 kg) IBW/kg (Calculated) : 52.4  Temp (24hrs), Avg:99 F (37.2 C), Min:98.6 F (37 C), Max:99.3 F (37.4 C)   Recent Labs Lab 06/27/16 0950 06/27/16 1002 06/28/16 2207 06/28/16 2217  WBC 9.8  --  8.4  --   CREATININE 1.06*  --  1.32*  --   LATICACIDVEN  --  1.74  --  1.16    Estimated Creatinine Clearance: 44.8 mL/min (by C-G formula based on SCr of 1.32 mg/dL (H)).    Allergies  Allergen Reactions  . Aspirin Other (See Comments)    Causes Asthma  . Dairy Aid [Lactase]     Causes Asthma attacks  . Eggs Or Egg-Derived Products     Causes Asthma  . Azithromycin Nausea And Vomiting  . Chocolate     Asthma attack, hives  . Corn Oil Other (See Comments)    Big doses--asthma attack --has integrated into diet  . Gabapentin Other (See Comments)    Blisters in mouth  . Peanut-Containing Drug Products     Asthma, eye swelling, itchy eyes, can't catch her breath  . Wheat     Asthma, hivesBig doses--big doses -has integrated into diet    Antimicrobials this admission: Vancomycin 9/30 >>  Dose adjustments this admission: -  Microbiology results: pending  Thank you for allowing pharmacy to be a part of this patient's care.  Alicia [name] 06/29/2016 4:21 AM

## 2016-06-29 NOTE — Progress Notes (Signed)
VASCULAR LAB PRELIMINARY  PRELIMINARY  PRELIMINARY  PRELIMINARY  Right lower extremity venous duplex  Preliminary report:  Right:  No evidence of DVT, superficial thrombosis, or Baker's cyst.  Argil Mahl, RVS 06/29/2016, 9:59 AM

## 2016-06-30 DIAGNOSIS — D696 Thrombocytopenia, unspecified: Secondary | ICD-10-CM

## 2016-06-30 DIAGNOSIS — L03115 Cellulitis of right lower limb: Principal | ICD-10-CM

## 2016-06-30 DIAGNOSIS — N179 Acute kidney failure, unspecified: Secondary | ICD-10-CM

## 2016-06-30 LAB — CBC
HCT: 32.6 % — ABNORMAL LOW (ref 36.0–46.0)
Hemoglobin: 11.2 g/dL — ABNORMAL LOW (ref 12.0–15.0)
MCH: 33.8 pg (ref 26.0–34.0)
MCHC: 34.4 g/dL (ref 30.0–36.0)
MCV: 98.5 fL (ref 78.0–100.0)
PLATELETS: 76 10*3/uL — AB (ref 150–400)
RBC: 3.31 MIL/uL — AB (ref 3.87–5.11)
RDW: 14 % (ref 11.5–15.5)
WBC: 4 10*3/uL (ref 4.0–10.5)

## 2016-06-30 LAB — BASIC METABOLIC PANEL
Anion gap: 6 (ref 5–15)
BUN: 12 mg/dL (ref 6–20)
CALCIUM: 8.3 mg/dL — AB (ref 8.9–10.3)
CO2: 22 mmol/L (ref 22–32)
Chloride: 108 mmol/L (ref 101–111)
Creatinine, Ser: 1.03 mg/dL — ABNORMAL HIGH (ref 0.44–1.00)
GFR, EST NON AFRICAN AMERICAN: 59 mL/min — AB (ref >60)
GLUCOSE: 130 mg/dL — AB (ref 65–99)
POTASSIUM: 4.4 mmol/L (ref 3.5–5.1)
SODIUM: 136 mmol/L (ref 135–145)

## 2016-06-30 LAB — GLUCOSE, CAPILLARY: GLUCOSE-CAPILLARY: 143 mg/dL — AB (ref 65–99)

## 2016-06-30 MED ORDER — FLUCONAZOLE 200 MG PO TABS
200.0000 mg | ORAL_TABLET | Freq: Once | ORAL | Status: AC
Start: 2016-06-30 — End: 2016-06-30
  Administered 2016-06-30: 200 mg via ORAL
  Filled 2016-06-30 (×2): qty 1

## 2016-06-30 NOTE — Progress Notes (Signed)
Pt having leg cramps. Takes Klor-Con at home. Has not had any since being her per pt. Nurse paged on-call provider to make K. Schorr aware of leg cramps. Nurse waiting for reply.

## 2016-06-30 NOTE — Progress Notes (Signed)
PROGRESS NOTE    Alicia [name]  [id number] DOB: 11/25/1960 DOA: 06/28/2016 PCP: Lamar Blinks, MD    Brief Narrative: 55 y.o. 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  Assessment & Plan:   # Right lower extremity cellulitis: No significant improvement. Continue IV vancomycin. Follow up culture results. Doppler ultrasound negative for DVT. Continue supportive care. -Ordered a dose of fluconazole to prevent fungal infection. Patient reported that she had fungal vaginitis in the past when she was on antibiotics.  #  Acute kidney injury on admission likely hemodynamically mediated. Serum creatinine level already improved.  #Congenital hepatic fibrosis: Stable. Continue lactulose and supportive care.    #GERD (gastroesophageal reflux disease): On Protonix. Stable    #Thrombocytopenia ([place]): In the setting of liver disease. Platelet level around baseline. No sign of bleeding.   DVT prophylaxis: SCD on left lower extremity. Encourage early embolism. No anticoagulant isn't because of some cytopenia. Code Status: Full code Family Communication: No family present at bedside Disposition Plan: Likely discharge home in 1-2 days  Antimicrobials: IV vancomycin started on September 30.  Subjective: The patient was seen and examined at bedside. Denied headache, dizziness, chest pain, shortness of breath, nausea vomiting or abdominal pain. Right lower extremity pain is stable with current medications.  Objective: Vitals:   06/29/16 2017 06/29/16 2239 06/30/16 0515 06/30/16 0727  BP:  130/68 113/69   Pulse:  84 78   Resp:  18 18   Temp:  98.4 F (36.9 C) 98.9 F (37.2 C)   TempSrc:  Oral Oral   SpO2: 96% 97% 99%   Weight:    77.5 kg (170 lb 13.7 oz)  Height:          Intake/Output Summary (Last 24 hours) at 06/30/16 1235 Last data filed at 06/30/16 0520  Gross per 24 hour  Intake              730 ml  Output             1100 ml  Net             -370 ml   Filed Weights   06/28/16 2340 06/29/16 0511 06/30/16 0727  Weight: 72.3 kg (159 lb 6.3 oz) 74.1 kg (163 lb 5.8 oz) 77.5 kg (170 lb 13.7 oz)    Examination:  General exam: Appears calm and comfortable  Respiratory system: Clear to auscultation. Respiratory effort normal. Cardiovascular system: S1 & S2 heard, RRR.  No pedal edema. Gastrointestinal system: Abdomen is nondistended, soft and nontender. Normal bowel sounds heard. Central nervous system: Alert and oriented. No focal neurological deficits. Extremities: Symmetric 5 x 5 power. No edema. Skin: .Right lower extremity has erythema, warmth and tenderness. No visible discharge noticed Psychiatry: Judgement and insight appear normal. Mood & affect appropriate.     Data Reviewed: I have personally reviewed following labs and imaging studies  CBC:  Recent Labs Lab 06/27/16 0950 06/28/16 2207 06/29/16 0421 06/30/16 0444  WBC 9.8 8.4 5.0 4.0  NEUTROABS 8.1* 6.3 3.4  --   HGB 13.0 12.9 10.9* 11.2*  HCT 37.7 37.4 31.1* 32.6*  MCV 102.2* 100.5* 98.1 98.5  PLT 65* 90* 72* 76*   Basic Metabolic Panel:  Recent Labs Lab 06/27/16 0950 06/28/16 2207 06/29/16 0421 06/30/16 0444  NA 137 138 139 136  K 3.9 4.2 3.8 4.4  CL 106 107 111 108  CO2 25 24 23 22   GLUCOSE 201* 135* 133* 130*  BUN 15 14 13 12   CREATININE 1.06* 1.32* 1.18* 1.03*  CALCIUM 8.4* 8.7* 8.0* 8.3*   GFR: Estimated Creatinine Clearance: 59.4 mL/min (by C-G formula based on SCr of 1.03 mg/dL (H)). Liver Function Tests:  Recent Labs Lab 06/27/16 0950  AST 57*  ALT 50  ALKPHOS 134*  BILITOT 4.5*  PROT 5.7*  ALBUMIN 3.0*   No results for input(s): LIPASE, AMYLASE in the last 168 hours. No results for input(s): AMMONIA in the last 168 hours. Coagulation  Profile: No results for input(s): INR, PROTIME in the last 168 hours. Cardiac Enzymes: No results for input(s): CKTOTAL, CKMB, CKMBINDEX, TROPONINI in the last 168 hours. BNP (last 3 results) No results for input(s): PROBNP in the last 8760 hours. HbA1C: No results for input(s): HGBA1C in the last 72 hours. CBG:  Recent Labs Lab 06/29/16 0853 06/30/16 0734  GLUCAP 199* 143*   Lipid Profile: No results for input(s): CHOL, HDL, LDLCALC, TRIG, CHOLHDL, LDLDIRECT in the last 72 hours. Thyroid Function Tests: No results for input(s): TSH, T4TOTAL, FREET4, T3FREE, THYROIDAB in the last 72 hours. Anemia Panel: No results for input(s): VITAMINB12, FOLATE, FERRITIN, TIBC, IRON, RETICCTPCT in the last 72 hours. Sepsis Labs:  Recent Labs Lab 06/27/16 1002 06/28/16 2217  LATICACIDVEN 1.74 1.16    Recent Results (from the past 240 hour(s))  MRSA PCR Screening     Status: None   Collection Time: 06/29/16 12:30 AM  Result Value Ref Range Status   MRSA by PCR NEGATIVE NEGATIVE Final    Comment:        The GeneXpert MRSA Assay (FDA approved for NASAL specimens only), is one component of a comprehensive MRSA colonization surveillance program. It is not intended to diagnose MRSA infection nor to guide or monitor treatment for MRSA infections.          Radiology Studies: No results found.      Scheduled Meds: . fluconazole  200 mg Oral Once  . lactulose  20 g Oral BID  . mometasone-formoterol  2 puff Inhalation BID  . pantoprazole  40 mg Oral BID  . vancomycin  1,250 mg Intravenous Q24H   Continuous Infusions:    LOS: 1 day    Time spent: 25 minutes    Donivin Wirt Tanna Furry, MD [facility] Pager [phone]  If 7PM-7AM, please contact night-coverage [email or web address] Password [email or web address] 06/30/2016, 12:35 PM

## 2016-07-01 LAB — GLUCOSE, CAPILLARY: GLUCOSE-CAPILLARY: 122 mg/dL — AB (ref 65–99)

## 2016-07-01 MED ORDER — DOXYCYCLINE HYCLATE 100 MG PO CAPS: 100.0000 mg | ORAL_CAPSULE | Freq: Two times a day (BID) | ORAL | 0 refills | Status: AC

## 2016-07-01 MED ORDER — CYCLOBENZAPRINE HCL 10 MG PO TABS
10.0000 mg | ORAL_TABLET | Freq: Three times a day (TID) | ORAL | Status: DC | PRN
  Administered 2016-07-01: 10 mg via ORAL
  Filled 2016-07-01: qty 1

## 2016-07-01 MED ORDER — AMOXICILLIN-POT CLAVULANATE 875-125 MG PO TABS: 1.0000 | ORAL_TABLET | Freq: Two times a day (BID) | ORAL | 0 refills | Status: AC

## 2016-07-01 NOTE — Progress Notes (Signed)
Patient alert and oriented with no pain, VSS. Patient's spouse at bedside. Patient given discharge instructions and prescriptions. Patient verbalized understanding of discharge instructions and follow-up. All questions and concerns answered. Patient left unit via wheelchair.

## 2016-07-01 NOTE — Discharge Summary (Signed)
Physician Discharge Summary  Alicia [name] [id number] DOB: 1960/12/12 DOA: 06/28/2016  PCP: Lamar Blinks, MD  Admit date: 06/28/2016 Discharge date: 07/01/2016  Admitted From:  Disposition:   Recommendations for Outpatient Follow-up:  1. Follow up with PCP in 1-2 weeks 2. Please obtain BMP/CBC in one week 3. Please follow up on the following pending results:blood cultures.  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  Patient was treated for right lower extremity cellulitis with IV vancomycin in the hospital. Doppler ultrasound negative for DVT. Patient had clinical improvement in rash. Cultures are pending. Patient denied fever, chills, nausea, vomiting, chest pain or shortness of breath. Her right lower extremity cellulitis rash and pain improved. She'll be discharged home with oral antibiotics for 7 days. I advised patient to follow-up with her PCP.  Patient Dilaudid acute kidney injury on admission likely hemodynamically mediated. Serum creatinine level improved on discharge. On discharge patient denied any needs and verbalized understanding of follow-up instructions.   Discharge Diagnoses:  Principal Problem:   Cellulitis Active Problems:   Asthma, severe persistent, with upper airway instability   Congenital hepatic fibrosis   GERD (gastroesophageal reflux disease)   Thrombocytopenia (HCC)   AKI (acute kidney injury) [facility])    Discharge Instructions  Discharge Instructions    Call MD for:  persistant dizziness or light-headedness    Complete by:  As directed    Call MD for:  redness,  tenderness, or signs of infection (pain, swelling, redness, odor or green/yellow discharge around incision site)    Complete by:  As directed    Call MD for:  temperature >100.4    Complete by:  As directed    Diet - low sodium heart healthy    Complete by:  As directed    Increase activity slowly    Complete by:  As directed        Medication List    STOP taking these medications   cephALEXin 500 MG capsule Commonly known as:  KEFLEX     TAKE these medications   acetaminophen 500 MG tablet Commonly known as:  TYLENOL Take 500 mg by mouth every 6 (six) hours as needed for moderate pain.   AEROCHAMBER MV inhaler Use as instructed   amoxicillin-clavulanate 875-125 MG tablet Commonly known as:  AUGMENTIN Take 1 tablet by mouth 2 (two) times daily.   benzonatate 100 MG capsule Commonly known as:  TESSALON TAKE 1 CAPSULE (100 MG TOTAL) BY MOUTH EVERY 8 (EIGHT) HOURS AS NEEDED. What changed:  See the new instructions.   CALCIUM-MAGNESIUM-VITAMIN D PO Take 1 tablet by mouth daily.   doxycycline 100 MG capsule Commonly known as:  VIBRAMYCIN Take 1 capsule (100 mg total) by mouth 2 (two) times daily.   furosemide 40 MG tablet Commonly known as:  LASIX Take 120 mg by mouth daily.   KLOR-CON M20 20 MEQ tablet Generic drug:  potassium chloride SA Take 1 tablet by mouth daily. Reported on 11/09/2015   lactulose 10 GM/15ML solution Commonly known as:  CHRONULAC Take 20 g by mouth 2 (two) times daily.   mometasone-formoterol 200-5 MCG/ACT Aero Commonly known as:  DULERA INHALE 2 PUFFS BY MOUTH TWO TIMES DAILY.   pantoprazole 40 MG tablet  Commonly known as:  PROTONIX Take 1 tablet by mouth 2 (two) times daily.   PROAIR HFA 108 (90 Base) MCG/ACT inhaler Generic drug:  albuterol INHALE 2 PUFFS EVERY FOUR (4) HOURS AS NEEDED FOR WHEEZING.   ranitidine 300 MG tablet Commonly known as:  ZANTAC TAKE 1 TABLET AT BEDTIME   spironolactone 100 MG tablet Commonly known as:   ALDACTONE Take 300 mg by mouth daily.      Follow-up Information    COPLAND,JESSICA, MD. Schedule an appointment as soon as possible for a visit in 1 week(s).   Specialty:  Family Medicine Contact information: [address] [place] [address] [phone]          Allergies  Allergen Reactions  . Aspirin Other (See Comments)    Causes Asthma  . Dairy Aid [Lactase]     Causes Asthma attacks  . Eggs Or Egg-Derived Products     Causes Asthma  . Azithromycin Nausea And Vomiting  . Chocolate     Asthma attack, hives  . Corn Oil Other (See Comments)    Big doses--asthma attack --has integrated into diet  . Gabapentin Other (See Comments)    Blisters in mouth  . Peanut-Containing Drug Products     Asthma, eye swelling, itchy eyes, can't catch her breath  . Wheat     Asthma, hivesBig doses--big doses -has integrated into diet     Procedures/Studies: Dg Tibia/fibula Right  Result Date: 06/27/2016 CLINICAL DATA:  55 year old female with abrasion to right distal tib-fib 3 days ago with increased pain redness and swelling. Chronic liver disease. Initial encounter. EXAM: RIGHT TIBIA AND FIBULA - 2 VIEW COMPARISON:  None. FINDINGS: Soft tissue swelling and stranding about the right tib-fib. No subcutaneous gas. No radiopaque foreign body identified. No acute osseous abnormality identified. Alignment at the right knee and ankle is preserved. No knee or ankle joint effusion is evident. IMPRESSION: Soft tissue swelling/inflammation. No acute osseous abnormality identified. Electronically Signed   By: Genevie Ann M.D.   On: 06/27/2016 10:21     Subjective: Patient was seen and examined at bedside. Denied fever, chills, headache, dizziness, nausea, vomiting, chest pain, shortness of breath. Denied right lower exudative pain. Reports the redness is improving significantly.   Discharge Exam: Vitals:   06/30/16 2146 07/01/16 0609  BP: 118/67 116/65  Pulse: 99 80  Resp:  18 18  Temp: 98.4 F (36.9 C) 98.8 F (37.1 C)   Vitals:   06/30/16 1505 06/30/16 2146 07/01/16 0609 07/01/16 1033  BP: 121/72 118/67 116/65   Pulse: 99 99 80   Resp: 17 18 18    Temp: 98 F (36.7 C) 98.4 F (36.9 C) 98.8 F (37.1 C)   TempSrc: Oral Oral Oral   SpO2: 99% 97% 97% 98%  Weight:      Height:        General: Pt is alert, awake, not in acute distress Cardiovascular: RRR, S1/S2 +, no rubs, no gallops Respiratory: CTA bilaterally, no wheezing, no rhonchi Abdominal: Soft, NT, ND, bowel sounds + Extremities: no edema, no cyanosis. Right lower extremity redness improving, much better than yesterday.    The results of significant diagnostics from this hospitalization (including imaging, microbiology, ancillary and laboratory) are listed below for reference.     Microbiology: Recent Results (from the past 240 hour(s))  Culture, blood (routine x 2)     Status: None (Preliminary result)   Collection Time: 06/28/16 10:05 PM  Result Value Ref Range  Status   Specimen Description BLOOD RIGHT WRIST  Final   Special Requests BOTTLES DRAWN AEROBIC AND ANAEROBIC 10CC  Final   Culture   Final    NO GROWTH 1 DAY Performed at [facility]    Report Status PENDING  Incomplete  MRSA PCR Screening     Status: None   Collection Time: 06/29/16 12:30 AM  Result Value Ref Range Status   MRSA by PCR NEGATIVE NEGATIVE Final    Comment:        The GeneXpert MRSA Assay (FDA approved for NASAL specimens only), is one component of a comprehensive MRSA colonization surveillance program. It is not intended to diagnose MRSA infection nor to guide or monitor treatment for MRSA infections.      Labs: BNP (last 3 results)  Recent Labs  05/29/16 1735  BNP [id number]   Basic Metabolic Panel:  Recent Labs Lab 06/27/16 0950 06/28/16 2207 06/29/16 0421 06/30/16 0444  NA 137 138 139 136  K 3.9 4.2 3.8 4.4  CL 106 107 111 108  CO2 25 24 23 22   GLUCOSE 201* 135* 133* 130*   BUN 15 14 13 12   CREATININE 1.06* 1.32* 1.18* 1.03*  CALCIUM 8.4* 8.7* 8.0* 8.3*   Liver Function Tests:  Recent Labs Lab 06/27/16 0950  AST 57*  ALT 50  ALKPHOS 134*  BILITOT 4.5*  PROT 5.7*  ALBUMIN 3.0*   No results for input(s): LIPASE, AMYLASE in the last 168 hours. No results for input(s): AMMONIA in the last 168 hours. CBC:  Recent Labs Lab 06/27/16 0950 06/28/16 2207 06/29/16 0421 06/30/16 0444  WBC 9.8 8.4 5.0 4.0  NEUTROABS 8.1* 6.3 3.4  --   HGB 13.0 12.9 10.9* 11.2*  HCT 37.7 37.4 31.1* 32.6*  MCV 102.2* 100.5* 98.1 98.5  PLT 65* 90* 72* 76*   Cardiac Enzymes: No results for input(s): CKTOTAL, CKMB, CKMBINDEX, TROPONINI in the last 168 hours. BNP: Invalid input(s): POCBNP CBG:  Recent Labs Lab 06/29/16 0853 06/30/16 0734 07/01/16 0733  GLUCAP 199* 143* 122*   D-Dimer No results for input(s): DDIMER in the last 72 hours. Hgb A1c No results for input(s): HGBA1C in the last 72 hours. Lipid Profile No results for input(s): CHOL, HDL, LDLCALC, TRIG, CHOLHDL, LDLDIRECT in the last 72 hours. Thyroid function studies No results for input(s): TSH, T4TOTAL, T3FREE, THYROIDAB in the last 72 hours.  Invalid input(s): FREET3 Anemia work up No results for input(s): VITAMINB12, FOLATE, FERRITIN, TIBC, IRON, RETICCTPCT in the last 72 hours. Urinalysis    Component Value Date/Time   COLORURINE YELLOW 05/29/2016 1718   APPEARANCEUR CLEAR 05/29/2016 1718   LABSPEC 1.009 05/29/2016 1718   PHURINE 6.5 05/29/2016 1718   GLUCOSEU 100 (A) 05/29/2016 1718   HGBUR NEGATIVE 05/29/2016 1718   BILIRUBINUR NEGATIVE 05/29/2016 1718   KETONESUR NEGATIVE 05/29/2016 1718   PROTEINUR NEGATIVE 05/29/2016 1718   NITRITE NEGATIVE 05/29/2016 1718   LEUKOCYTESUR NEGATIVE 05/29/2016 1718   Sepsis Labs Invalid input(s): PROCALCITONIN,  WBC,  LACTICIDVEN Microbiology Recent Results (from the past 240 hour(s))  Culture, blood (routine x 2)     Status: None (Preliminary  result)   Collection Time: 06/28/16 10:05 PM  Result Value Ref Range Status   Specimen Description BLOOD RIGHT WRIST  Final   Special Requests BOTTLES DRAWN AEROBIC AND ANAEROBIC 10CC  Final   Culture   Final    NO GROWTH 1 DAY Performed at [facility]    Report Status PENDING  Incomplete  MRSA PCR Screening     Status: None   Collection Time: 06/29/16 12:30 AM  Result Value Ref Range Status   MRSA by PCR NEGATIVE NEGATIVE Final    Comment:        The GeneXpert MRSA Assay (FDA approved for NASAL specimens only), is one component of a comprehensive MRSA colonization surveillance program. It is not intended to diagnose MRSA infection nor to guide or monitor treatment for MRSA infections.      Time coordinating discharge: 26 minutes  SIGNED:   Rosita Fire, MD  [facility] 07/01/2016, 11:23 AM Pager   If 7PM-7AM, please contact night-coverage [email or web address] Password [email or web address]

## 2016-07-01 NOTE — Progress Notes (Signed)
Date: July 01, 2016 Discharge orders checked for needs. No needs present at time of discharge. Cyncere Sontag, RN, BSN, CCM   [phone] 

## 2016-07-03 ENCOUNTER — Other Ambulatory Visit (INDEPENDENT_AMBULATORY_CARE_PROVIDER_SITE_OTHER): Payer: BLUE CROSS/BLUE SHIELD

## 2016-07-03 ENCOUNTER — Telehealth: Payer: Self-pay | Admitting: Family Medicine

## 2016-07-03 DIAGNOSIS — E2740 Unspecified adrenocortical insufficiency: Secondary | ICD-10-CM

## 2016-07-03 LAB — CORTISOL: CORTISOL PLASMA: 1.3 ug/dL

## 2016-07-03 MED ORDER — HYDROCORTISONE 5 MG PO TABS
ORAL_TABLET | ORAL | 0 refills | Status: DC
Start: 2016-07-03 — End: 2016-10-03

## 2016-07-03 NOTE — Telephone Encounter (Signed)
Received her cortisol level this am which is quite low at 1.3.  Called [facility] and spoke with Dr. Kathlene November with endocrinology.  She needs to start on glucocorticoids. Will rx hydrocortisone 10 mg am and 5 mg pm for her- will need to triple her dose if she gets sick for 3 days. She should hear from endocrinology for an appt soon- call them if she does not.  When rx hydrocortisone tablets a warning came up re her corn oil allergy.  Pt confirms that she had a mild reaction on allergy testing only, she is able to eat corn oil without any difficulty.  We do not expect she will have any trouble with allergic reaction but she will dc med if any concern

## 2016-07-03 NOTE — Telephone Encounter (Signed)
Pt came in office and dropped off a document requesting to have some orders put in for her (lab orders). It was asked by Dr Barron Schmid and to please send results to Dr Barron Schmid office. Document given directly to Provider (Copland).

## 2016-07-04 LAB — CULTURE, BLOOD (ROUTINE X 2): Culture: NO GROWTH

## 2016-07-05 LAB — ACTH: C206 ACTH: 9 pg/mL (ref 6–50)

## 2016-07-11 ENCOUNTER — Encounter: Payer: Self-pay | Admitting: Adult Health

## 2016-07-11 ENCOUNTER — Ambulatory Visit (INDEPENDENT_AMBULATORY_CARE_PROVIDER_SITE_OTHER): Payer: BLUE CROSS/BLUE SHIELD | Admitting: Family Medicine

## 2016-07-11 ENCOUNTER — Encounter: Payer: Self-pay | Admitting: Family Medicine

## 2016-07-11 ENCOUNTER — Ambulatory Visit (INDEPENDENT_AMBULATORY_CARE_PROVIDER_SITE_OTHER): Payer: BLUE CROSS/BLUE SHIELD | Admitting: Adult Health

## 2016-07-11 VITALS — BP 118/62 | HR 90 | Temp 98.2°F | Ht 63.0 in | Wt 156.4 lb

## 2016-07-11 DIAGNOSIS — Z09 Encounter for follow-up examination after completed treatment for conditions other than malignant neoplasm: Secondary | ICD-10-CM

## 2016-07-11 DIAGNOSIS — J4550 Severe persistent asthma, uncomplicated: Secondary | ICD-10-CM | POA: Diagnosis not present

## 2016-07-11 DIAGNOSIS — Z23 Encounter for immunization: Secondary | ICD-10-CM | POA: Diagnosis not present

## 2016-07-11 DIAGNOSIS — D869 Sarcoidosis, unspecified: Secondary | ICD-10-CM | POA: Diagnosis not present

## 2016-07-11 LAB — CBC WITH DIFFERENTIAL/PLATELET
Basophils Absolute: 0 10*3/uL (ref 0.0–0.1)
Basophils Relative: 0.5 % (ref 0.0–3.0)
EOS ABS: 0 10*3/uL (ref 0.0–0.7)
Eosinophils Relative: 0.4 % (ref 0.0–5.0)
HCT: 38.5 % (ref 36.0–46.0)
HEMOGLOBIN: 13.3 g/dL (ref 12.0–15.0)
LYMPHS ABS: 0.8 10*3/uL (ref 0.7–4.0)
Lymphocytes Relative: 10.4 % — ABNORMAL LOW (ref 12.0–46.0)
MCHC: 34.6 g/dL (ref 30.0–36.0)
MCV: 100 fl (ref 78.0–100.0)
MONO ABS: 0.7 10*3/uL (ref 0.1–1.0)
Monocytes Relative: 10.2 % (ref 3.0–12.0)
NEUTROS PCT: 78.5 % — AB (ref 43.0–77.0)
Neutro Abs: 5.6 10*3/uL (ref 1.4–7.7)
Platelets: 78 10*3/uL — ABNORMAL LOW (ref 150.0–400.0)
RBC: 3.86 Mil/uL — AB (ref 3.87–5.11)
RDW: 14.6 % (ref 11.5–15.5)
WBC: 7.2 10*3/uL (ref 4.0–10.5)

## 2016-07-11 NOTE — Assessment & Plan Note (Signed)
Improved control without flare   Plan  Patient Instructions  Continue on current regimen  Keep up good work .  Follow up with Dr. Elsworth Soho  In 3 months and As needed

## 2016-07-11 NOTE — Patient Instructions (Signed)
Continue on current regimen  Keep up good work .  Follow up with Dr. Elsworth Soho  In 3 months and As needed

## 2016-07-11 NOTE — Progress Notes (Signed)
Pre visit review using our clinic review tool, if applicable. No additional management support is needed unless otherwise documented below in the visit note. 

## 2016-07-11 NOTE — Progress Notes (Signed)
Chief Complaint  Patient presents with  . Follow-up    ED Follow up;Pt report cellulitis in LT leg and then moved to RT leg     Subjective: Patient is a 55 y.o. female here for hospital f/u.  Inpt for RLE cellulitis. Finished abx and is now doing much better. No pain, fevers, or redness. She is ready to go back to work. She does need some follow up lab work today.  ROS: Const: No fevers Skin: No rash  Family History  Problem Relation Age of Onset  . Arthritis Mother   . Heart disease Father     Triple bypass  . Migraines Father   . Heart attack Paternal Grandmother   . Stroke Paternal Grandfather   . Liver disease Sister     Juvenile cysitc liver disease  . Asthma Sister    Past Medical History:  Diagnosis Date  . Allergy   . Anemia   . Asthma   . Cellulitis   . Hepatic fibrosis ([place])   . Sarcoidosis ([place])    Allergies  Allergen Reactions  . Aspirin Other (See Comments)    Causes Asthma  . Dairy Aid [Lactase]     Causes Asthma attacks  . Eggs Or Egg-Derived Products     Causes Asthma  . Azithromycin Nausea And Vomiting  . Chocolate     Asthma attack, hives  . Corn Oil Other (See Comments)    Big doses--asthma attack --has integrated into diet  . Gabapentin Other (See Comments)    Blisters in mouth  . Peanut-Containing Drug Products     Asthma, eye swelling, itchy eyes, can't catch her breath  . Wheat     Asthma, hivesBig doses--big doses -has integrated into diet    Current Outpatient Prescriptions:  .  acetaminophen (TYLENOL) 500 MG tablet, Take 500 mg by mouth every 6 (six) hours as needed for moderate pain., Disp: , Rfl:  .  benzonatate (TESSALON) 100 MG capsule, TAKE 1 CAPSULE (100 MG TOTAL) BY MOUTH EVERY 8 (EIGHT) HOURS AS NEEDED. (Patient taking differently: TAKE 1 CAPSULE (100 MG TOTAL) BY MOUTH EVERY 8 (EIGHT) HOURS AS NEEDED FOR COUGH.), Disp: 30 capsule, Rfl: 3 .  furosemide (LASIX) 40 MG tablet, Take 120 mg by mouth daily., Disp: , Rfl:  .   hydrocortisone (CORTEF) 5 MG tablet, Take 2 pills (10 mg) in the morning and 1 pill (5mg ) in the afternoon daily.  Adjust as directed by MD, Disp: 90 tablet, Rfl: 0 .  KLOR-CON M20 20 MEQ tablet, Take 1 tablet by mouth daily. Reported on 11/09/2015, Disp: , Rfl: 10 .  lactulose (CHRONULAC) 10 GM/15ML solution, Take 20 g by mouth 2 (two) times daily., Disp: , Rfl:  .  mometasone-formoterol (DULERA) 200-5 MCG/ACT AERO, INHALE 2 PUFFS BY MOUTH TWO TIMES DAILY., Disp: 1 Inhaler, Rfl: 5 .  pantoprazole (PROTONIX) 40 MG tablet, Take 1 tablet by mouth 2 (two) times daily. , Disp: , Rfl:  .  PROAIR HFA 108 (90 Base) MCG/ACT inhaler, INHALE 2 PUFFS EVERY FOUR (4) HOURS AS NEEDED FOR WHEEZING., Disp: 8.5 Inhaler, Rfl: 0 .  ranitidine (ZANTAC) 300 MG tablet, TAKE 1 TABLET AT BEDTIME, Disp: 90 tablet, Rfl: 1 .  Spacer/Aero-Holding Chambers (AEROCHAMBER MV) inhaler, Use as instructed, Disp: 1 each, Rfl: 0 .  spironolactone (ALDACTONE) 100 MG tablet, Take 300 mg by mouth daily. , Disp: , Rfl:  .  CALCIUM-MAGNESIUM-VITAMIN D PO, Take 1 tablet by mouth daily., Disp: , Rfl:  Objective: BP 118/62 (BP Location: Left Arm, Patient Position: Sitting, Cuff Size: Large)   Pulse 90   Temp 98.2 F (36.8 C) (Oral)   Ht 5\' 3"  (1.6 m)   Wt 156 lb 6.4 oz (70.9 kg)   SpO2 99%   BMI 27.71 kg/m  General: Awake, appears stated age Heart: RRR, no murmurs, no bruits, 2+pitting edema b/l up to prox 1/2 of tibia Lungs: CTAB, no rales, wheezes or rhonchi. Normal effort Skin: LE's without erythema or drainage, no TTP Psych: Age appropriate judgment and insight, normal affect and mood  Assessment and Plan: Hospital discharge follow-up - Plan: CBC w/Diff, Basic Metabolic Panel (BMET)  Orders as above. Swelling is chronic and 2/2 liver issues.  F/u on labs per hospital D/C summary. All other f/u has been completed. F/u prn. The patient voiced understanding and agreement to the plan.  [place], DO 07/11/16   11:46 AM

## 2016-07-11 NOTE — Progress Notes (Signed)
Subjective:    Patient ID: Alicia [name], female    DOB: 08-31-1961, 55 y.o.   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 She is on lasix  and aldactone daily  (Dr. Ester Rink)   07/11/2016 Follow up: Sarcoid and Asthma  Pt returns for a 3 month follow up . Says overall her breathing is doing well. No flare of cough or wheezing .  She remains on dulera Twice daily  .  She was tapered off steroids last ov . Did not notice any change in breathing off prednisone.  She is following with endocrinology now undergoing workup for adrenal insufficiency.  She is on solucortef.   Recently admitted for cellulitis in leg , tx w /abx. It is improving slowly .   Continue to follow up hepatology at [facility]. They are talking in future may consider live liver transplant.   Fluid levels /LE edema is doing okay on current diuretics. On aldactone and lasix.  Gets labs often.   Works full time. Becoming harder b/c of her medical issues. She needs the medical benefits.    Significant tests/ events   PFTs 07/2011-FEV1 63%, moderate airway obstruction, no bronchodilator response, nml TLC, DLCO 77%  PFTs 02/2016 >>moderate airway obstruction that improved with albuterol 17%  - FEV1 51% to 59% DLCO 80%   CT sinuses clear CT chest 08/2015 >>Innumerable (> 40) subcentimeter solid pulmonary nodules throughout both lungs, largest 7 mm in the right upper lobe, generally centrilobular in distribution, all of which have mildly increased in size since 06/28/2013 Cirrhosis with varices, new liver lesion  MRI liver 10/2015 >> numerous siderotic nodules  FOB on 12/08/15 , showed nml upper airway , nml Vocal cord motion. No endobronchial  lesions. Negative for infection and biopsy did not show cancer.  Path showed non necrotizing granulatous inflammation  ACE 91   Past Medical History:  Diagnosis Date  . Allergy   . Anemia   . Asthma   . Hepatic fibrosis ([place])   . Sarcoidosis [facility])    Current Outpatient Prescriptions on File Prior to Visit  Medication Sig Dispense Refill  . benzonatate (TESSALON) 100 MG capsule TAKE 1 CAPSULE (100 MG TOTAL) BY MOUTH EVERY 8 (EIGHT) HOURS AS NEEDED. (Patient taking differently: TAKE 1 CAPSULE (100 MG TOTAL) BY MOUTH EVERY 8 (EIGHT) HOURS AS NEEDED FOR COUGH.) 30 capsule 3  . furosemide (LASIX) 40 MG tablet Take 120 mg by mouth daily.    . hydrocortisone (CORTEF) 5 MG tablet Take 2 pills (10 mg) in the morning and 1 pill (5mg ) in the afternoon daily.  Adjust as directed by MD 90 tablet 0  . KLOR-CON M20 20 MEQ tablet Take 1 tablet by mouth daily. Reported on 11/09/2015  10  . lactulose (CHRONULAC) 10 GM/15ML solution Take 20 g by mouth 2 (two) times daily.    . mometasone-formoterol (DULERA) 200-5 MCG/ACT AERO INHALE 2 PUFFS BY MOUTH TWO TIMES DAILY. 1 Inhaler 5  . pantoprazole (PROTONIX) 40 MG tablet Take 1 tablet by mouth 2 (two) times daily.     Marland Kitchen PROAIR HFA 108 (90 Base) MCG/ACT inhaler INHALE 2 PUFFS EVERY FOUR (4) HOURS AS NEEDED FOR WHEEZING. 8.5 Inhaler 0  . ranitidine (ZANTAC) 300 MG tablet TAKE 1  TABLET AT BEDTIME 90 tablet 1  . Spacer/Aero-Holding Chambers (AEROCHAMBER MV) inhaler Use as instructed 1 each 0  . spironolactone (ALDACTONE) 100 MG tablet Take 300 mg by mouth daily.     Marland Kitchen acetaminophen (TYLENOL) 500 MG tablet Take 500 mg by mouth every 6 (six) hours as needed for moderate pain.    Marland Kitchen CALCIUM-MAGNESIUM-VITAMIN D PO Take 1 tablet by mouth daily.     No current facility-administered medications on file prior to visit.        Review of Systems Patient denies significant cough, hemoptysis,  chest pain, palpitations, pedal edema, orthopnea, paroxysmal nocturnal dyspnea,  lightheadedness, nausea, vomiting, abdominal or  leg pains      Objective:   Physical Exam   Vitals:   07/11/16 1020  BP: 113/72  Pulse: 85  SpO2: 95%  Weight: 156 lb (70.8 kg)  Height: 5\' 7"  (1.702 m)    Gen. Pleasant, well-nourished, in no distress ENT - no lesions, no post nasal drip Neck: No JVD, no thyromegaly, no carotid bruits Lungs: no use of accessory muscles, no dullness to percussion, clear without rales or rhonchi  Cardiovascular: Rhythm regular, heart sounds  normal, no murmurs or gallops, tr  peripheral edema Along right LE healing ulcer.  Musculoskeletal: No deformities, no cyanosis or clubbing   Janese Radabaugh NP-C  [facility]  07/11/2016

## 2016-07-11 NOTE — Assessment & Plan Note (Signed)
Stable without flare -no change on steroids . Weaned off 03/2016 .   Plan  Patient Instructions  Continue on current regimen  Keep up good work .  Follow up with Dr. Elsworth Soho  In 3 months and As needed

## 2016-07-12 ENCOUNTER — Other Ambulatory Visit (INDEPENDENT_AMBULATORY_CARE_PROVIDER_SITE_OTHER): Payer: BLUE CROSS/BLUE SHIELD

## 2016-07-12 DIAGNOSIS — Z09 Encounter for follow-up examination after completed treatment for conditions other than malignant neoplasm: Secondary | ICD-10-CM

## 2016-07-12 LAB — CBC WITH DIFFERENTIAL/PLATELET
BASOS ABS: 0 10*3/uL (ref 0.0–0.1)
Basophils Relative: 0.1 % (ref 0.0–3.0)
EOS PCT: 0.6 % (ref 0.0–5.0)
Eosinophils Absolute: 0 10*3/uL (ref 0.0–0.7)
HEMATOCRIT: 38.3 % (ref 36.0–46.0)
Hemoglobin: 13.1 g/dL (ref 12.0–15.0)
LYMPHS ABS: 1.1 10*3/uL (ref 0.7–4.0)
LYMPHS PCT: 17.1 % (ref 12.0–46.0)
MCHC: 34.3 g/dL (ref 30.0–36.0)
MCV: 100.9 fl — AB (ref 78.0–100.0)
MONOS PCT: 12.1 % — AB (ref 3.0–12.0)
Monocytes Absolute: 0.8 10*3/uL (ref 0.1–1.0)
NEUTROS PCT: 70.1 % (ref 43.0–77.0)
Neutro Abs: 4.5 10*3/uL (ref 1.4–7.7)
Platelets: 66 10*3/uL — ABNORMAL LOW (ref 150.0–400.0)
RBC: 3.8 Mil/uL — ABNORMAL LOW (ref 3.87–5.11)
RDW: 14.4 % (ref 11.5–15.5)
WBC: 6.5 10*3/uL (ref 4.0–10.5)

## 2016-07-12 LAB — BASIC METABOLIC PANEL
BUN: 18 mg/dL (ref 6–23)
CO2: 28 meq/L (ref 19–32)
Calcium: 8.6 mg/dL (ref 8.4–10.5)
Chloride: 102 mEq/L (ref 96–112)
Creatinine, Ser: 1.1 mg/dL (ref 0.40–1.20)
GFR: 54.29 mL/min — AB (ref >60.00)
GLUCOSE: 115 mg/dL — AB (ref 70–99)
POTASSIUM: 4.2 meq/L (ref 3.5–5.1)
SODIUM: 137 meq/L (ref 135–145)

## 2016-07-12 NOTE — Progress Notes (Signed)
Reviewed & agree with plan  

## 2016-07-16 ENCOUNTER — Telehealth: Payer: Self-pay | Admitting: Family Medicine

## 2016-07-16 ENCOUNTER — Encounter: Payer: Self-pay | Admitting: Family Medicine

## 2016-07-16 NOTE — Telephone Encounter (Signed)
Called pt- this is fine, please give me more details LMOM and sent mychart

## 2016-07-16 NOTE — Telephone Encounter (Signed)
Caller name: Relationship to patient: Self  Can be reached: [phone]   Reason for call: Patient would like a call back to discuss reduced hours at work. Needs recomendation from Dr. Lorelei Pont in order to get hours reduced

## 2016-07-19 ENCOUNTER — Other Ambulatory Visit: Payer: Self-pay | Admitting: Pulmonary Disease

## 2016-08-06 ENCOUNTER — Other Ambulatory Visit: Payer: Self-pay | Admitting: Pulmonary Disease

## 2016-10-03 ENCOUNTER — Ambulatory Visit (HOSPITAL_BASED_OUTPATIENT_CLINIC_OR_DEPARTMENT_OTHER)
Admission: RE | Admit: 2016-10-03 | Discharge: 2016-10-03 | Disposition: A | Discharge status: Home / Self Care | Payer: BLUE CROSS/BLUE SHIELD | Source: Ambulatory Visit | Attending: Family Medicine | Admitting: Family Medicine

## 2016-10-03 ENCOUNTER — Ambulatory Visit (INDEPENDENT_AMBULATORY_CARE_PROVIDER_SITE_OTHER): Payer: BLUE CROSS/BLUE SHIELD | Admitting: Family Medicine

## 2016-10-03 VITALS — BP 130/82 | HR 90 | Temp 100.1°F | Ht 63.0 in | Wt 157.6 lb

## 2016-10-03 DIAGNOSIS — D7589 Other specified diseases of blood and blood-forming organs: Secondary | ICD-10-CM | POA: Diagnosis not present

## 2016-10-03 DIAGNOSIS — R509 Fever, unspecified: Secondary | ICD-10-CM

## 2016-10-03 DIAGNOSIS — J209 Acute bronchitis, unspecified: Secondary | ICD-10-CM | POA: Diagnosis not present

## 2016-10-03 DIAGNOSIS — R05 Cough: Secondary | ICD-10-CM | POA: Diagnosis not present

## 2016-10-03 DIAGNOSIS — R053 Chronic cough: Secondary | ICD-10-CM

## 2016-10-03 MED ORDER — BENZONATATE 100 MG PO CAPS: 100.0000 mg | ORAL_CAPSULE | Freq: Three times a day (TID) | ORAL | 0 refills | Status: DC | PRN

## 2016-10-03 MED ORDER — DOXYCYCLINE HYCLATE 100 MG PO CAPS: 100.0000 mg | ORAL_CAPSULE | Freq: Two times a day (BID) | ORAL | 0 refills | Status: DC

## 2016-10-03 NOTE — Progress Notes (Addendum)
[facility] [address] [place], [place] [address] [id number] [phone]  Date:  10/03/2016   Name:  Alicia [name]   DOB:  1961-08-14   MRN:  [id number]  PCP:  Lamar Blinks, MD    Chief Complaint: Cough (c/o mostly dry cough but first thing in the morning prod cough with yellow mucus. Also having weakness, headache and dizziness. Pt had endoscopy Dec.6th and started feeling poorly afterwards. )   History of Present Illness:  Alicia [name] is a 56 y.o. very pleasant female patient who presents with the following:  On 12/6 she had endoscopy- this was done for surveillance of her liver condition.  About a week later she noted onset of illness with sx of fatigue, feeling "realy draggy," headaches, runny nose.  She felt uncomfortable and raw in her sinuses. Then she developed a low grade fever- she first noted this about 2 weeks ago.  It tends to get worse at night.  She is also coughing a lot- her throat feels raw She will bring up some yellow mucus at time Her temp has gotten up to 100.5  No nausea, vomiting or diarrhea, but she does not feel very hungry- she is making herself eat however  She is taking ibuprofen at night for the fever- no meds so far today  She has a history of congenital hepatis fibrosis which is managed by her liver specialist at [facility] She denies any travel out of the country No rash  Patient Active Problem List   Diagnosis Date Noted  . Cellulitis 06/28/2016  . Thrombocytopenia ([place]) 06/28/2016  . AKI (acute kidney injury) ([place]) 06/28/2016  . Multiple pulmonary nodules determined by computed tomography of lung   . Chronic cough 09/07/2015  . Hypertension, portal ([place]) 09/04/2011  . Asthma with chronic obstructive pulmonary disease (COPD) ([place]) 09/04/2011  . 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 GERD (gastroesophageal reflux disease) 07/04/2011  . Sarcoid ([place]) 07/01/2011  . Asthma, severe persistent,  with upper airway instability 06/13/2011  . 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 Allergy   . Anemia   . Asthma   . Cellulitis   . Hepatic fibrosis ([place])   . Sarcoidosis [facility])     Past Surgical History:  Procedure Laterality Date  . SPINAL FUSION  12/2007   Double spinal Fusion  . VIDEO BRONCHOSCOPY Bilateral 12/08/2015   Procedure: VIDEO BRONCHOSCOPY WITH FLUORO;  Surgeon: Rigoberto Noel, MD;  Location: [facility];  Service: Cardiopulmonary;  Laterality: Bilateral;    Social History  Substance Use Topics  . Smoking status: Never Smoker  . Smokeless tobacco: Never Used  . Alcohol use Yes     Comment: wine nightly    Family History  Problem Relation Age of Onset  . Arthritis Mother   . Heart disease Father     Triple bypass  . Migraines Father   . Heart attack Paternal Grandmother   . Stroke Paternal Grandfather   . Liver disease Sister     Juvenile cysitc liver disease  . Asthma Sister     Allergies  Allergen Reactions  . Aspirin Other (See Comments)    Causes Asthma  . Dairy Aid [Lactase]     Causes Asthma attacks  . Eggs Or Egg-Derived Products     Causes Asthma  . Azithromycin Nausea And Vomiting  . Chocolate     Asthma attack, hives  .  Corn Oil Other (See Comments)    Big doses--asthma attack --has integrated into diet  . Gabapentin Other (See Comments)    Blisters in mouth  . Peanut-Containing Drug Products     Asthma, eye swelling, itchy eyes, can't catch her breath  . Wheat     Asthma, hivesBig doses--big doses -has integrated into diet    Medication list has been reviewed and updated.  Current Outpatient Prescriptions on File Prior to Visit  Medication Sig Dispense Refill  . benzonatate (TESSALON) 100 MG capsule TAKE 1 CAPSULE (100 MG TOTAL) BY MOUTH EVERY 8 (EIGHT) HOURS AS NEEDED. 30 capsule 3  . CALCIUM-MAGNESIUM-VITAMIN D PO Take 1 tablet by mouth daily.    . DULERA 200-5 MCG/ACT AERO INHALE  2 PUFFS BY MOUTH TWO TIMES DAILY. 13 Inhaler 5  . furosemide (LASIX) 40 MG tablet Take 120 mg by mouth daily.    Marland Kitchen lactulose (CHRONULAC) 10 GM/15ML solution Take 20 g by mouth 2 (two) times daily.    . pantoprazole (PROTONIX) 40 MG tablet Take 1 tablet by mouth 2 (two) times daily.     Marland Kitchen PROAIR HFA 108 (90 Base) MCG/ACT inhaler INHALE 2 PUFFS EVERY FOUR (4) HOURS AS NEEDED FOR WHEEZING. 8.5 Inhaler 0  . ranitidine (ZANTAC) 300 MG tablet TAKE 1 TABLET AT BEDTIME 90 tablet 1  . Spacer/Aero-Holding Chambers (AEROCHAMBER MV) inhaler Use as instructed 1 each 0  . spironolactone (ALDACTONE) 100 MG tablet Take 300 mg by mouth daily.      No current facility-administered medications on file prior to visit.     Review of Systems:  As per HPI- otherwise negative.   Physical Examination: Blood pressure 130/82, pulse (!) 108, temperature 100.1 F (37.8 C), temperature source Oral, height 5\' 3"  (1.6 m), weight 157 lb 9.6 oz (71.5 kg), SpO2 98 %.  Vitals:   10/03/16 1513  Weight: 157 lb 9.6 oz (71.5 kg)   Body mass index is 27.92 kg/m. Ideal Body Weight:    GEN: WDWN, NAD, Non-toxic, A & O x 3, looks well, mild overweight, dry cough in room HEENT: Atraumatic, Normocephalic. Neck supple. No masses, No LAD. Bilateral TM wnl, oropharynx normal.  PEERL,EOMI.   Ears and Nose: No external deformity. CV: RRR, No M/G/R. No JVD. No thrill. No extra heart sounds. PULM: CTA B, no wheezes, crackles, rhonchi. No retractions. No resp. distress. No accessory muscle use. ABD: S, NT, ND,she does have hepatomegaly however EXTR: No c/c/e NEURO Normal gait.  PSYCH: Normally interactive. Conversant. Not depressed or anxious appearing.  Calm demeanor.   Dg Chest 2 View  Result Date: 10/03/2016 CLINICAL DATA:  Cough, congestion. EXAM: CHEST  2 VIEW COMPARISON:  Radiographs of May 29, 2016. FINDINGS: The heart size and mediastinal contours are within normal limits. Both lungs are clear. No pneumothorax or  pleural effusion is noted. The visualized skeletal structures are unremarkable. IMPRESSION: No active cardiopulmonary disease. Electronically Signed   By: Marijo Conception, M.D.   On: 10/03/2016 15:50    Assessment and Plan: Persistent cough - Plan: DG Chest 2 View, benzonatate (TESSALON) 100 MG capsule  Low grade fever - Plan: DG Chest 2 View, CBC with Differential/Platelet  Acute bronchitis, unspecified organism - Plan: doxycycline (VIBRAMYCIN) 100 MG capsule  Here today with a cough for 3 weeks No pneumonia on her film today- will get a CBC as well due to persistent low grade fevers Treat for possible bacterial bronchitis with doxycycline. She will alert me if not getting  better soon- Sooner if worse.    Signed Lamar Blinks, MD  Received her labs 1/5-most c/w a viral infection She has thrombocytopenia at her normal level She is resting at home today, her temp has been up to 99.9.  She will contact me if low grade fevers do not resolve over the next 2 days- Sooner if worse.  Also will order a CBC, B12 and folate to do later as a lab visit only   Results for orders placed or performed in visit on 10/03/16  CBC with Differential/Platelet  Result Value Ref Range   WBC 5.3 4.0 - 10.5 K/uL   RBC 3.41 (L) 3.87 - 5.11 Mil/uL   Hemoglobin 12.1 12.0 - 15.0 g/dL   HCT 34.8 (L) 36.0 - 46.0 %   MCV 102.4 (H) 78.0 - 100.0 fl   MCHC 34.9 30.0 - 36.0 g/dL   RDW 14.8 11.5 - 15.5 %   Platelets 73.0 Repeated and verified X2. (L) 150.0 - 400.0 K/uL   Neutrophils Relative % 68.8 43.0 - 77.0 %   Lymphocytes Relative 8.6 (L) 12.0 - 46.0 %   Monocytes Relative 21.7 Repeated and verified X2. (H) 3.0 - 12.0 %   Eosinophils Relative 0.7 0.0 - 5.0 %   Basophils Relative 0.2 0.0 - 3.0 %   Neutro Abs 3.6 1.4 - 7.7 K/uL   Lymphs Abs 0.5 (L) 0.7 - 4.0 K/uL   Monocytes Absolute 1.2 (H) 0.1 - 1.0 K/uL   Eosinophils Absolute 0.0 0.0 - 0.7 K/uL   Basophils Absolute 0.0 0.0 - 0.1 K/uL

## 2016-10-03 NOTE — Progress Notes (Signed)
Pre visit review using our clinic review tool, if applicable. No additional management support is needed unless otherwise documented below in the visit note. 

## 2016-10-03 NOTE — Patient Instructions (Signed)
It was nice to see you today- we will treat you with doxycycline twice a day for 10 days with food and water Also use the tessalon perles as needed I will be in touch with your blood results asap If you are getting worse please let me know

## 2016-10-04 ENCOUNTER — Encounter: Payer: Self-pay | Admitting: Family Medicine

## 2016-10-04 LAB — CBC WITH DIFFERENTIAL/PLATELET
BASOS ABS: 0 10*3/uL (ref 0.0–0.1)
Basophils Relative: 0.2 % (ref 0.0–3.0)
EOS ABS: 0 10*3/uL (ref 0.0–0.7)
EOS PCT: 0.7 % (ref 0.0–5.0)
HCT: 34.8 % — ABNORMAL LOW (ref 36.0–46.0)
Hemoglobin: 12.1 g/dL (ref 12.0–15.0)
LYMPHS ABS: 0.5 10*3/uL — AB (ref 0.7–4.0)
Lymphocytes Relative: 8.6 % — ABNORMAL LOW (ref 12.0–46.0)
MCHC: 34.9 g/dL (ref 30.0–36.0)
MCV: 102.4 fl — ABNORMAL HIGH (ref 78.0–100.0)
MONO ABS: 1.2 10*3/uL — AB (ref 0.1–1.0)
Monocytes Relative: 21.7 % — ABNORMAL HIGH (ref 3.0–12.0)
NEUTROS PCT: 68.8 % (ref 43.0–77.0)
Neutro Abs: 3.6 10*3/uL (ref 1.4–7.7)
Platelets: 73 10*3/uL — ABNORMAL LOW (ref 150.0–400.0)
RBC: 3.41 Mil/uL — AB (ref 3.87–5.11)
RDW: 14.8 % (ref 11.5–15.5)
WBC: 5.3 10*3/uL (ref 4.0–10.5)

## 2016-10-04 NOTE — Addendum Note (Signed)
Addended by: Lamar Blinks C on: 10/04/2016 01:40 PM   Modules accepted: Orders

## 2016-10-09 ENCOUNTER — Encounter (HOSPITAL_BASED_OUTPATIENT_CLINIC_OR_DEPARTMENT_OTHER): Payer: Self-pay

## 2016-10-09 ENCOUNTER — Emergency Department (HOSPITAL_BASED_OUTPATIENT_CLINIC_OR_DEPARTMENT_OTHER)
Admission: EM | Admit: 2016-10-09 | Discharge: 2016-10-09 | Disposition: A | Discharge status: Home / Self Care | Payer: BLUE CROSS/BLUE SHIELD | Attending: Emergency Medicine | Admitting: Emergency Medicine

## 2016-10-09 ENCOUNTER — Emergency Department (HOSPITAL_BASED_OUTPATIENT_CLINIC_OR_DEPARTMENT_OTHER): Payer: BLUE CROSS/BLUE SHIELD

## 2016-10-09 ENCOUNTER — Telehealth: Payer: Self-pay | Admitting: Family Medicine

## 2016-10-09 DIAGNOSIS — R0602 Shortness of breath: Secondary | ICD-10-CM | POA: Insufficient documentation

## 2016-10-09 DIAGNOSIS — J45909 Unspecified asthma, uncomplicated: Secondary | ICD-10-CM | POA: Diagnosis not present

## 2016-10-09 DIAGNOSIS — I10 Essential (primary) hypertension: Secondary | ICD-10-CM | POA: Diagnosis not present

## 2016-10-09 DIAGNOSIS — Z79899 Other long term (current) drug therapy: Secondary | ICD-10-CM | POA: Diagnosis not present

## 2016-10-09 DIAGNOSIS — R079 Chest pain, unspecified: Secondary | ICD-10-CM | POA: Insufficient documentation

## 2016-10-09 DIAGNOSIS — R05 Cough: Secondary | ICD-10-CM | POA: Diagnosis not present

## 2016-10-09 DIAGNOSIS — R059 Cough, unspecified: Secondary | ICD-10-CM

## 2016-10-09 LAB — CBC WITH DIFFERENTIAL/PLATELET
BASOS ABS: 0 10*3/uL (ref 0.0–0.1)
Basophils Relative: 0 %
EOS ABS: 0 10*3/uL (ref 0.0–0.7)
Eosinophils Relative: 0 %
HCT: 34.8 % — ABNORMAL LOW (ref 36.0–46.0)
HEMOGLOBIN: 12.5 g/dL (ref 12.0–15.0)
LYMPHS ABS: 1.8 10*3/uL (ref 0.7–4.0)
Lymphocytes Relative: 27 %
MCH: 35.2 pg — ABNORMAL HIGH (ref 26.0–34.0)
MCHC: 35.9 g/dL (ref 30.0–36.0)
MCV: 98 fL (ref 78.0–100.0)
MONO ABS: 1 10*3/uL (ref 0.1–1.0)
Monocytes Relative: 14 %
NEUTROS PCT: 59 %
Neutro Abs: 4 10*3/uL (ref 1.7–7.7)
PLATELETS: 96 10*3/uL — AB (ref 150–400)
RBC: 3.55 MIL/uL — AB (ref 3.87–5.11)
RDW: 14.2 % (ref 11.5–15.5)
WBC: 6.8 10*3/uL (ref 4.0–10.5)

## 2016-10-09 LAB — BASIC METABOLIC PANEL
ANION GAP: 10 (ref 5–15)
BUN: 15 mg/dL (ref 6–20)
CALCIUM: 8.3 mg/dL — AB (ref 8.9–10.3)
CO2: 30 mmol/L (ref 22–32)
CREATININE: 1.25 mg/dL — AB (ref 0.44–1.00)
Chloride: 90 mmol/L — ABNORMAL LOW (ref 101–111)
GFR calc Af Amer: 54 mL/min — ABNORMAL LOW (ref >60)
GFR calc non Af Amer: 47 mL/min — ABNORMAL LOW (ref >60)
GLUCOSE: 183 mg/dL — AB (ref 65–99)
Potassium: 2.5 mmol/L — CL (ref 3.5–5.1)
Sodium: 130 mmol/L — ABNORMAL LOW (ref 135–145)

## 2016-10-09 MED ORDER — PREDNISONE 50 MG PO TABS: 60.0000 mg | ORAL_TABLET | Freq: Once | ORAL | Status: DC

## 2016-10-09 MED ORDER — LEVOFLOXACIN IN D5W 500 MG/100ML IV SOLN
500.0000 mg | INTRAVENOUS | Status: DC
  Administered 2016-10-09: 500 mg via INTRAVENOUS
  Filled 2016-10-09: qty 100

## 2016-10-09 MED ORDER — POTASSIUM CHLORIDE CRYS ER 20 MEQ PO TBCR
40.0000 meq | EXTENDED_RELEASE_TABLET | Freq: Once | ORAL | Status: AC
  Administered 2016-10-09: 40 meq via ORAL
  Filled 2016-10-09: qty 2

## 2016-10-09 MED ORDER — IPRATROPIUM-ALBUTEROL 0.5-2.5 (3) MG/3ML IN SOLN
3.0000 mL | Freq: Four times a day (QID) | RESPIRATORY_TRACT | Status: DC
  Administered 2016-10-09: 3 mL via RESPIRATORY_TRACT
  Filled 2016-10-09: qty 3

## 2016-10-09 MED ORDER — ALBUTEROL SULFATE HFA 108 (90 BASE) MCG/ACT IN AERS: 1.0000 | INHALATION_SPRAY | Freq: Four times a day (QID) | RESPIRATORY_TRACT | 0 refills | Status: DC | PRN

## 2016-10-09 MED ORDER — HYDROCODONE-HOMATROPINE 5-1.5 MG/5ML PO SYRP: 5.0000 mL | ORAL_SOLUTION | Freq: Four times a day (QID) | ORAL | 0 refills | Status: DC | PRN

## 2016-10-09 MED ORDER — METHYLPREDNISOLONE SODIUM SUCC 125 MG IJ SOLR
125.0000 mg | Freq: Once | INTRAMUSCULAR | Status: AC
  Administered 2016-10-09: 125 mg via INTRAVENOUS
  Filled 2016-10-09: qty 2

## 2016-10-09 MED ORDER — IOPAMIDOL (ISOVUE-370) INJECTION 76%
100.0000 mL | Freq: Once | INTRAVENOUS | Status: AC | PRN
  Administered 2016-10-09: 100 mL via INTRAVENOUS

## 2016-10-09 MED ORDER — LEVOFLOXACIN 750 MG PO TABS: 750.0000 mg | ORAL_TABLET | Freq: Every day | ORAL | 0 refills | Status: DC

## 2016-10-09 NOTE — Telephone Encounter (Signed)
Gave her a call back- seen last week and started on doxycycline.   She states that she continues to have intermittent temps up to 100.3 She may feel lightheaded.   No abd pain or back pain She is drinking plenty of fluids She is having SOB with exertion.  Asked her to come in and be seen in the ER here at the med center and she agrees   Dg Chest 2 View  Result Date: 10/03/2016 CLINICAL DATA:  Cough, congestion. EXAM: CHEST  2 VIEW COMPARISON:  Radiographs of May 29, 2016. FINDINGS: The heart size and mediastinal contours are within normal limits. Both lungs are clear. No pneumothorax or pleural effusion is noted. The visualized skeletal structures are unremarkable. IMPRESSION: No active cardiopulmonary disease. Electronically Signed   By: Marijo Conception, M.D.   On: 10/03/2016 15:50

## 2016-10-09 NOTE — ED Provider Notes (Signed)
[place] DEPT [facility] Provider Note   CSN: [id number] Arrival date & time: 10/09/16  1416     History   Chief Complaint Chief Complaint  Patient presents with  . Cough    HPI Rhyder Cryder is a 56 y.o. female.  56 year old female past medical history of sarcoidosis, padded fibrosis, asthma and allergies presents to the emergency department today secondary to prolonged cough. Patient states that she's had for almost exactly 1 month of cough, fevers every day and progressively worsening malaise. She saw her primary doctor approximately one week ago and started on doxycycline and does not seem to have helped at all and her symptoms have continued here worse. She now has sore throat and a hoarse voice as well. States she's never had bronchitis like this before. She had decreased energy as well.    Cough  This is a recurrent problem. The current episode started more than 1 week ago. The problem occurs constantly. The problem has been gradually worsening. The cough is productive of sputum. The maximum temperature recorded prior to her arrival was 100 to 100.9 F. Associated symptoms include chest pain, sore throat and shortness of breath. Pertinent negatives include no chills. She is not a smoker. Her past medical history is significant for bronchitis. Past medical history comments: sarcoidosis.    Past Medical History:  Diagnosis Date  . Allergy   . Anemia   . Asthma   . Cellulitis   . Hepatic fibrosis ([place])   . Sarcoidosis [facility])     Patient Active Problem List   Diagnosis Date Noted  . Cellulitis 06/28/2016  . Thrombocytopenia ([place]) 06/28/2016  . AKI (acute kidney injury) ([place]) 06/28/2016  . Multiple pulmonary nodules determined by computed tomography of lung   . Chronic cough 09/07/2015  . Hypertension, portal ([place]) 09/04/2011  . Asthma with chronic obstructive pulmonary disease (COPD) ([place]) 09/04/2011  . 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 GERD (gastroesophageal reflux  disease) 07/04/2011  . Sarcoid ([place]) 07/01/2011  . Asthma, severe persistent, with upper airway instability 06/13/2011  . 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 SPINAL FUSION  12/2007   Double spinal Fusion  . VIDEO BRONCHOSCOPY Bilateral 12/08/2015   Procedure: VIDEO BRONCHOSCOPY WITH FLUORO;  Surgeon: Rigoberto Noel, MD;  Location: [facility];  Service: Cardiopulmonary;  Laterality: Bilateral;    OB History    No data available       Home Medications    Prior to Admission medications   Medication Sig Start Date End Date Taking? Authorizing Provider  ALBUTEROL IN Inhale into the lungs.   Yes Historical Provider, MD  Benzonatate (TESSALON PO) Take by mouth.   Yes Historical Provider, MD  mometasone-formoterol (DULERA) 100-5 MCG/ACT AERO Inhale 2 puffs into the lungs 2 (two) times daily.   Yes Historical Provider, MD  PREDNISONE PO Take by mouth.   Yes Historical Provider, MD  albuterol (PROVENTIL HFA;VENTOLIN HFA) 108 (90 Base) MCG/ACT inhaler Inhale 1-2 puffs into the lungs every 6 (six) hours as needed for wheezing or shortness of breath (or cough). 10/09/16   Merrily Pew, MD  furosemide (LASIX) 40 MG tablet Take 120 mg by mouth daily.    Historical Provider, MD  HYDROcodone-homatropine (HYCODAN) 5-1.5 MG/5ML syrup Take 5 mLs by mouth every 6 (six) hours as needed for cough. 10/09/16   Merrily Pew, MD  lactulose (CHRONULAC) 10 GM/15ML solution Take 20 g by mouth 2 (two) times daily. 06/27/16  Historical Provider, MD  levofloxacin (LEVAQUIN) 750 MG tablet Take 1 tablet (750 mg total) by mouth daily. X 7 days 10/09/16   Merrily Pew, MD  pantoprazole (PROTONIX) 40 MG tablet Take 1 tablet by mouth 2 (two) times daily.  04/05/11   Historical Provider, MD  ranitidine (ZANTAC) 300 MG tablet TAKE 1 TABLET AT BEDTIME 06/26/16   Rigoberto Noel, MD  spironolactone (ALDACTONE) 100 MG tablet Take 300 mg by mouth daily.     Historical  Provider, MD    Family History Family History  Problem Relation Age of Onset  . Arthritis Mother   . Heart disease Father     Triple bypass  . Migraines Father   . Heart attack Paternal Grandmother   . Stroke Paternal Grandfather   . Liver disease Sister     Juvenile cysitc liver disease  . Asthma Sister     Social History Social History  Substance Use Topics  . Smoking status: Never Smoker  . Smokeless tobacco: Never Used  . Alcohol use No     Allergies   Aspirin; Dairy aid [lactase]; Eggs or egg-derived products; Azithromycin; Chocolate; Corn oil; Gabapentin; Peanut-containing drug products; and Wheat   Review of Systems Review of Systems  Constitutional: Negative for chills.  HENT: Positive for sore throat.   Respiratory: Positive for cough and shortness of breath.   Cardiovascular: Positive for chest pain.  All other systems reviewed and are negative.    Physical Exam Updated Vital Signs BP 123/69 (BP Location: Left Arm)   Pulse 87   Temp 98.1 F (36.7 C) (Oral)   Resp 20   Ht 5\' 3"  (1.6 m)   Wt 148 lb (67.1 kg)   SpO2 96%   BMI 26.22 kg/m   Physical Exam  Constitutional: She is oriented to person, place, and time. She appears well-developed and well-nourished.  HENT:  Head: Normocephalic and atraumatic.  Small white plaques in posterior oropharynx  Eyes: Conjunctivae and EOM are normal.  Neck: Normal range of motion.  Cardiovascular: Normal rate and regular rhythm.   Pulmonary/Chest: Effort normal. No stridor. No respiratory distress. She has wheezes (LLL).  Abdominal: She exhibits no distension.  Musculoskeletal: She exhibits no edema or deformity.  Neurological: She is alert and oriented to person, place, and time. Coordination normal.  Skin: Skin is warm and dry.  Nursing note and vitals reviewed.    ED Treatments / Results  Labs (all labs ordered are listed, but only abnormal results are displayed) Labs Reviewed  CBC WITH  DIFFERENTIAL/PLATELET - Abnormal; Notable for the following:       Result Value   RBC 3.55 (*)    HCT 34.8 (*)    MCH 35.2 (*)    Platelets 96 (*)    All other components within normal limits  BASIC METABOLIC PANEL - Abnormal; Notable for the following:    Sodium 130 (*)    Potassium 2.5 (*)    Chloride 90 (*)    Glucose, Bld 183 (*)    Creatinine, Ser 1.25 (*)    Calcium 8.3 (*)    GFR calc non Af Amer 47 (*)    GFR calc Af Amer 54 (*)    All other components within normal limits    EKG  EKG Interpretation None       Radiology Dg Chest 2 View  Result Date: 10/09/2016 CLINICAL DATA:  Cough, fever EXAM: CHEST  2 VIEW COMPARISON:  10/03/2016 FINDINGS: The heart size and  mediastinal contours are within normal limits. Both lungs are clear. The visualized skeletal structures are unremarkable. IMPRESSION: No active cardiopulmonary disease. Electronically Signed   By: Kathreen Devoid   On: 10/09/2016 15:05   Ct Angio Chest Pe W Or Wo Contrast  Result Date: 10/09/2016 CLINICAL DATA:  Coughing dizziness for several days. Personal history of sarcoid. EXAM: CT ANGIOGRAPHY CHEST WITH CONTRAST TECHNIQUE: Multidetector CT imaging of the chest was performed using the standard protocol during bolus administration of intravenous contrast. Multiplanar CT image reconstructions and MIPs were obtained to evaluate the vascular anatomy. CONTRAST:  100 mL Isovue 370 COMPARISON:  CT of the chest with contrast 09/18/2015 FINDINGS: Cardiovascular: The heart size is normal. The aorta and great vessels are within normal limits. Pulmonary artery opacification is excellent. There are no focal filling defects to suggest pulmonary emboli. Mediastinum/Nodes: No significant cyst mediastinal or axillary adenopathy is present. The thoracic inlet is within normal limits. Dense breast tissue with calcifications is similar to the prior exam. Lungs/Pleura: Numerous bilateral pulmonary nodules are similar to the prior exam. The  largest nodule is stable in the right upper lobe. AP diameter measurement of 8 mm is not significantly changed. A 5 mm nodule in left upper lobe is stable. No significant new nodules are present. There is no significant airspace consolidation or effusion. Upper Abdomen: Cirrhosis of the liver is again noted. A hyperdense lesion in the medial left lobe is stable at 4 mm. No new lesions are present. Splenomegaly is evident. 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 Distal esophageal varices are noted. Musculoskeletal: Vertebral body heights and alignment are maintained. No focal lytic or blastic lesions are present. Review of the MIP images confirms the above findings. IMPRESSION: 1. Stable appearance of numerous bilateral pulmonary nodules. The differential diagnosis please multifocal adenocarcinoma or papillomatosis. Granulomatous cysts or rheumatoid nodules are also considered. This most likely represents sarcoid given the patient's positive history. 2. No significant progression of lung disease. 3. No pulmonary embolus. 4. Similar appearance of large gastroesophageal varices. 5. Hepatic cirrhosis with splenomegaly. Electronically Signed   By: San Morelle M.D.   On: 10/09/2016 18:06    Procedures Procedures (including critical care time)  Medications Ordered in ED Medications  ipratropium-albuterol (DUONEB) 0.5-2.5 (3) MG/3ML nebulizer solution 3 mL (3 mLs Nebulization Given 10/09/16 1535)  levofloxacin (LEVAQUIN) IVPB 500 mg (0 mg Intravenous Stopped 10/09/16 1749)  methylPREDNISolone sodium succinate (SOLU-MEDROL) 125 mg/2 mL injection 125 mg (125 mg Intravenous Given 10/09/16 1542)  iopamidol (ISOVUE-370) 76 % injection 100 mL (100 mLs Intravenous Contrast Given 10/09/16 1729)  potassium chloride SA (K-DUR,KLOR-CON) CR tablet 40 mEq (40 mEq Oral Given 10/09/16 1747)     Initial Impression / Assessment and Plan / ED Course  I have reviewed the triage vital signs and the nursing  notes.  Pertinent labs & imaging results that were available during my care of the patient were reviewed by me and considered in my medical decision making (see chart for details).  Clinical Course    Possibly Prolonged bronchitis is most likely cause. I will give her a DuoNeb and steroids and change her antibiotics from doxycycline to Levaquin. However secondary to the 1 month of symptoms and fevers worse at night and persistent malaise I will also do a CT scan of her chest to evaluate for pulmonary embolus, pneumonia, cancer or abscess. PE scan ok. Symptomatically improved with duoneb/steroids/abx. Plan for dc on abx. Already on prednisone for adrenal issues so won't give rx for that.  Will switch abx to levaquin. Plan for pcp follow up in a week. Also dr Dagmar Hait to follow up the ct scan (had one ordered for February anyway). Return here for new/worsening symptoms.   Final Clinical Impressions(s) / ED Diagnoses   Final diagnoses:  Cough    New Prescriptions New Prescriptions   ALBUTEROL (PROVENTIL HFA;VENTOLIN HFA) 108 (90 BASE) MCG/ACT INHALER    Inhale 1-2 puffs into the lungs every 6 (six) hours as needed for wheezing or shortness of breath (or cough).   HYDROCODONE-HOMATROPINE (HYCODAN) 5-1.5 MG/5ML SYRUP    Take 5 mLs by mouth every 6 (six) hours as needed for cough.   LEVOFLOXACIN (LEVAQUIN) 750 MG TABLET    Take 1 tablet (750 mg total) by mouth daily. X 7 days     Merrily Pew, MD 10/09/16 1840

## 2016-10-09 NOTE — ED Triage Notes (Signed)
C/o cough, SOB-was seen last week by PCP dx with bronchitis-started on doxy-pt states she is worse-slow steady gait

## 2016-10-09 NOTE — Telephone Encounter (Signed)
Patient called stating that she was seen on 10/03/16 and she is not feeling any better. She states that she is still struggling with the cough, her voice, and the fevers coming back. She is still taking the antibiotic but does not feel like it is doing much. She would like to know if there are any other recommendations or if she needs to come back in. Please advise   Phone: [phone]

## 2016-10-09 NOTE — ED Notes (Signed)
RT at bedside for breathing tx.

## 2016-10-16 ENCOUNTER — Ambulatory Visit: Payer: BLUE CROSS/BLUE SHIELD | Admitting: Family Medicine

## 2016-10-18 ENCOUNTER — Emergency Department (HOSPITAL_BASED_OUTPATIENT_CLINIC_OR_DEPARTMENT_OTHER)
Admission: EM | Admit: 2016-10-18 | Discharge: 2016-10-19 | Disposition: A | Discharge status: Home / Self Care | Payer: BLUE CROSS/BLUE SHIELD | Attending: Emergency Medicine | Admitting: Emergency Medicine

## 2016-10-18 ENCOUNTER — Encounter (HOSPITAL_BASED_OUTPATIENT_CLINIC_OR_DEPARTMENT_OTHER): Payer: Self-pay | Admitting: Emergency Medicine

## 2016-10-18 ENCOUNTER — Emergency Department (HOSPITAL_BASED_OUTPATIENT_CLINIC_OR_DEPARTMENT_OTHER): Payer: BLUE CROSS/BLUE SHIELD

## 2016-10-18 ENCOUNTER — Telehealth: Payer: Self-pay | Admitting: Family Medicine

## 2016-10-18 DIAGNOSIS — R609 Edema, unspecified: Secondary | ICD-10-CM | POA: Diagnosis not present

## 2016-10-18 DIAGNOSIS — Z91010 Allergy to peanuts: Secondary | ICD-10-CM | POA: Insufficient documentation

## 2016-10-18 DIAGNOSIS — D869 Sarcoidosis, unspecified: Secondary | ICD-10-CM | POA: Diagnosis not present

## 2016-10-18 DIAGNOSIS — J4550 Severe persistent asthma, uncomplicated: Secondary | ICD-10-CM | POA: Insufficient documentation

## 2016-10-18 DIAGNOSIS — R05 Cough: Secondary | ICD-10-CM | POA: Diagnosis present

## 2016-10-18 DIAGNOSIS — Z79899 Other long term (current) drug therapy: Secondary | ICD-10-CM | POA: Diagnosis not present

## 2016-10-18 DIAGNOSIS — R059 Cough, unspecified: Secondary | ICD-10-CM

## 2016-10-18 DIAGNOSIS — J449 Chronic obstructive pulmonary disease, unspecified: Secondary | ICD-10-CM | POA: Insufficient documentation

## 2016-10-18 LAB — CBC WITH DIFFERENTIAL/PLATELET
BASOS ABS: 0 10*3/uL (ref 0.0–0.1)
Basophils Relative: 0 %
Eosinophils Absolute: 0.1 10*3/uL (ref 0.0–0.7)
Eosinophils Relative: 1 %
HEMATOCRIT: 34.4 % — AB (ref 36.0–46.0)
Hemoglobin: 12.3 g/dL (ref 12.0–15.0)
LYMPHS PCT: 18 %
Lymphs Abs: 1.8 10*3/uL (ref 0.7–4.0)
MCH: 35 pg — ABNORMAL HIGH (ref 26.0–34.0)
MCHC: 35.8 g/dL (ref 30.0–36.0)
MCV: 98 fL (ref 78.0–100.0)
Monocytes Absolute: 1.7 10*3/uL — ABNORMAL HIGH (ref 0.1–1.0)
Monocytes Relative: 17 %
NEUTROS ABS: 6.5 10*3/uL (ref 1.7–7.7)
Neutrophils Relative %: 65 %
Platelets: 101 10*3/uL — ABNORMAL LOW (ref 150–400)
RBC: 3.51 MIL/uL — AB (ref 3.87–5.11)
RDW: 14.2 % (ref 11.5–15.5)
WBC: 10.1 10*3/uL (ref 4.0–10.5)

## 2016-10-18 LAB — BASIC METABOLIC PANEL
ANION GAP: 7 (ref 5–15)
BUN: 13 mg/dL (ref 6–20)
CO2: 30 mmol/L (ref 22–32)
Calcium: 8.1 mg/dL — ABNORMAL LOW (ref 8.9–10.3)
Chloride: 96 mmol/L — ABNORMAL LOW (ref 101–111)
Creatinine, Ser: 1.01 mg/dL — ABNORMAL HIGH (ref 0.44–1.00)
GFR calc Af Amer: >60 mL/min (ref >60)
GLUCOSE: 152 mg/dL — AB (ref 65–99)
POTASSIUM: 3.1 mmol/L — AB (ref 3.5–5.1)
Sodium: 133 mmol/L — ABNORMAL LOW (ref 135–145)

## 2016-10-18 MED ORDER — METHYLPREDNISOLONE SODIUM SUCC 125 MG IJ SOLR
125.0000 mg | Freq: Once | INTRAMUSCULAR | Status: AC
  Administered 2016-10-18: 125 mg via INTRAVENOUS
  Filled 2016-10-18: qty 2

## 2016-10-18 MED ORDER — BENZONATATE 100 MG PO CAPS
200.0000 mg | ORAL_CAPSULE | Freq: Once | ORAL | Status: AC
  Administered 2016-10-18: 200 mg via ORAL
  Filled 2016-10-18: qty 2

## 2016-10-18 NOTE — ED Notes (Signed)
MD talking with pt. Will attempt EKG again in a few minutes.

## 2016-10-18 NOTE — ED Triage Notes (Signed)
Pt being treated for bronchitis and treated with two different antibiotics.  Pt has been on doxy and levaquin.  Pt continues with productive cough and chest congestion.  Some sob with exertion.

## 2016-10-18 NOTE — ED Provider Notes (Signed)
[facility] DEPT [facility] Provider Note   CSN: [id number] Arrival date & time: 10/18/16  1848  By signing my name below, I, Dolores Hoose, attest that this documentation has been prepared under the direction and in the presence of Kiante Petrovich, MD . Electronically Signed: Dolores Hoose, Scribe. 10/18/2016. 11:05 PM.  History   Chief Complaint Chief Complaint  Patient presents with  . Shortness of Breath  . Cough   The history is provided by the patient. No language interpreter was used.  Shortness of Breath  This is a chronic problem. The problem occurs continuously.The current episode started more than 1 week ago. The problem has not changed since onset.Associated symptoms include cough. Pertinent negatives include no chest pain. Treatments tried: Antibiotics. The treatment provided no relief. Associated medical issues include asthma.  Cough  This is a chronic problem. The current episode started more than 1 week ago. The problem occurs every few minutes. The problem has not changed since onset.The cough is productive of sputum. There has been no fever. Associated symptoms include shortness of breath. Pertinent negatives include no chest pain. Treatments tried: abx. The treatment provided no relief. She is not a smoker. Her past medical history is significant for asthma.   HPI Comments:  Alicia [name] is a 56 y.o. female with pmhx of congenital hepatic fibrosis who presents to the Emergency Department complaining of persistent, unchanged cough onset over a month ago. She reports associated SOB and chest congestion. Pt was originally seen by her PCP in December 2017 and given doxycycline. She states that she was not feeling better and so called her PCP 10 days ago who told her to come to the ED. Pt came to the ED and had her abx switched to [place]. She returns today after having no relief with the [place]. Pt denies any chest pain.   Past Medical History:  Diagnosis Date  . Allergy     . Anemia   . Asthma   . Cellulitis   . Hepatic fibrosis ([place])   . Sarcoidosis [facility])     Patient Active Problem List   Diagnosis Date Noted  . Cellulitis 06/28/2016  . Thrombocytopenia ([place]) 06/28/2016  . AKI (acute kidney injury) ([place]) 06/28/2016  . Multiple pulmonary nodules determined by computed tomography of lung   . Chronic cough 09/07/2015  . Hypertension, portal ([place]) 09/04/2011  . Asthma with chronic obstructive pulmonary disease (COPD) ([place]) 09/04/2011  . 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 GERD (gastroesophageal reflux disease) 07/04/2011  . Sarcoid ([place]) 07/01/2011  . Asthma, severe persistent, with upper airway instability 06/13/2011  . 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 SPINAL FUSION  12/2007   Double spinal Fusion  . VIDEO BRONCHOSCOPY Bilateral 12/08/2015   Procedure: VIDEO BRONCHOSCOPY WITH FLUORO;  Surgeon: Rigoberto Noel, MD;  Location: [facility];  Service: Cardiopulmonary;  Laterality: Bilateral;    OB History    No data available       Home Medications    Prior to Admission medications   Medication Sig Start Date End Date Taking? Authorizing Provider  albuterol (PROVENTIL HFA;VENTOLIN HFA) 108 (90 Base) MCG/ACT inhaler Inhale 1-2 puffs into the lungs every 6 (six) hours as needed for wheezing or shortness of breath (or cough). 10/09/16   Merrily Pew, MD  Benzonatate (TESSALON PO) Take by mouth.    Historical Provider, MD  furosemide (LASIX) 40 MG tablet Take 120 mg by mouth daily.  Historical Provider, MD  HYDROcodone-homatropine (HYCODAN) 5-1.5 MG/5ML syrup Take 5 mLs by mouth every 6 (six) hours as needed for cough. 10/09/16   Merrily Pew, MD  lactulose (CHRONULAC) 10 GM/15ML solution Take 20 g by mouth 2 (two) times daily. 06/27/16   Historical Provider, MD  levofloxacin (LEVAQUIN) 750 MG tablet Take 1 tablet (750 mg total) by mouth daily. X 7 days 10/09/16   Merrily Pew, MD   mometasone-formoterol [facility]) 100-5 MCG/ACT AERO Inhale 2 puffs into the lungs 2 (two) times daily.    Historical Provider, MD  pantoprazole (PROTONIX) 40 MG tablet Take 1 tablet by mouth 2 (two) times daily.  04/05/11   Historical Provider, MD  PREDNISONE PO Take 3 mg by mouth.     Historical Provider, MD  ranitidine (ZANTAC) 300 MG tablet TAKE 1 TABLET AT BEDTIME 06/26/16   Rigoberto Noel, MD  spironolactone (ALDACTONE) 100 MG tablet Take 300 mg by mouth daily.     Historical Provider, MD    Family History Family History  Problem Relation Age of Onset  . Arthritis Mother   . Heart disease Father     Triple bypass  . Migraines Father   . Heart attack Paternal Grandmother   . Stroke Paternal Grandfather   . Liver disease Sister     Juvenile cysitc liver disease  . Asthma Sister     Social History Social History  Substance Use Topics  . Smoking status: Never Smoker  . Smokeless tobacco: Never Used  . Alcohol use No     Allergies   Aspirin; Dairy aid [lactase]; Eggs or egg-derived products; Azithromycin; Chocolate; Gabapentin; and Peanut-containing drug products   Review of Systems Review of Systems  HENT: Positive for congestion.   Respiratory: Positive for cough and shortness of breath.   Cardiovascular: Negative for chest pain.  All other systems reviewed and are negative.    Physical Exam Updated Vital Signs BP 135/78 (BP Location: Right Arm)   Pulse 84   Temp 98.3 F (36.8 C) (Oral)   Resp 14   Ht 5\' 3"  (1.6 m)   Wt 150 lb (68 kg)   SpO2 96%   BMI 26.57 kg/m   Physical Exam  Constitutional: She is oriented to person, place, and time. She appears well-developed and well-nourished.  HENT:  Head: Normocephalic and atraumatic.  Mouth/Throat: Oropharynx is clear and moist.  Eyes: Conjunctivae and EOM are normal. Pupils are equal, round, and reactive to light.  Neck: Normal range of motion. Neck supple.  No bruit  Cardiovascular: Normal rate, regular rhythm  and normal heart sounds.   No murmur heard. Pulmonary/Chest: Effort normal. No stridor. No respiratory distress.  Diminished breath sounds bilaterally.   Abdominal: Soft. Bowel sounds are normal. She exhibits no distension.  Musculoskeletal: Normal range of motion.  Pedal edema bilaterally.   Neurological: She is alert and oriented to person, place, and time.  Skin: Skin is warm and dry.   ED Treatments / Results  DIAGNOSTIC STUDIES:  Oxygen Saturation is 96% on RA, adequate by my interpretation.    COORDINATION OF CARE:  11:14 PM Discussed treatment plan with pt at bedside which includes blood work and EKG and pt agreed to plan.  Labs (all labs ordered are listed, but only abnormal results are displayed) Labs Reviewed  CBC WITH DIFFERENTIAL/PLATELET  BASIC METABOLIC PANEL  TROPONIN I    EKG  EKG Interpretation None       Radiology Dg Chest 2 View  Result  Date: 10/18/2016 CLINICAL DATA:  Cough and congestion EXAM: CHEST  2 VIEW COMPARISON:  Chest radiograph 10/09/2016 FINDINGS: Normal mediastinum and cardiac silhouette. Normal pulmonary vasculature. No evidence of effusion, infiltrate, or pneumothorax. No acute bony abnormality. RIGHT upper lobe pulmonary nodule correlates with nodules on CT. IMPRESSION: No acute cardiopulmonary process. Stable pulmonary nodules presumably related to sarcoidosis. Electronically Signed   By: Suzy Bouchard M.D.   On: 10/18/2016 19:40    Procedures Procedures (including critical care time)  Medications Ordered in ED Medications  benzonatate (TESSALON) capsule 200 mg (200 mg Oral Given 10/18/16 2348)  methylPREDNISolone sodium succinate (SOLU-MEDROL) 125 mg/2 mL injection 125 mg (125 mg Intravenous Given 10/18/16 2349)  iopamidol (ISOVUE-370) 76 % injection 100 mL (100 mLs Intravenous Contrast Given 10/19/16 0037)    No PE no signs of infection.  Suspect this is a multifactorial problem.  Has not resolved with multiple courses of  antibiotics.  Continue steroids start tessalon and carafate and follow up with pulmonology and ENT.     Final Clinical Impressions(s) / ED Diagnoses  Chronic cough: All questions answered to patient's satisfaction. Based on history and exam patient has been appropriately medically screened and emergency conditions excluded. Patient is stable for discharge at this time. Strict return precautions given for any further episodes, persistent fever, weakness or any concerns. I personally performed the services described in this documentation, which was scribed in my presence. The recorded information has been reviewed and is accurate.       Veatrice Kells, MD 10/19/16 1317

## 2016-10-18 NOTE — ED Notes (Signed)
ED Provider at bedside. 

## 2016-10-18 NOTE — Telephone Encounter (Signed)
Pt called in. She says that she was given levofloxacin when she went to the hospital. Pt says that she isn't  feeling better.  Pt scheduled her ER follow up. Pt has completed medication given and would like to know if provider could refill medication until visit.    [id number]   Pharmacy: [id number] - [place], [place]

## 2016-10-19 LAB — TROPONIN I: Troponin I: <0.03 ng/mL (ref <0.03)

## 2016-10-19 MED ORDER — PREDNISONE 20 MG PO TABS: ORAL_TABLET | ORAL | 0 refills | Status: DC

## 2016-10-19 MED ORDER — IOPAMIDOL (ISOVUE-370) INJECTION 76%
100.0000 mL | Freq: Once | INTRAVENOUS | Status: AC | PRN
  Administered 2016-10-19: 100 mL via INTRAVENOUS

## 2016-10-19 MED ORDER — BENZONATATE 100 MG PO CAPS: 100.0000 mg | ORAL_CAPSULE | Freq: Three times a day (TID) | ORAL | 0 refills | Status: DC

## 2016-10-19 MED ORDER — ALBUTEROL SULFATE HFA 108 (90 BASE) MCG/ACT IN AERS: 1.0000 | INHALATION_SPRAY | Freq: Four times a day (QID) | RESPIRATORY_TRACT | 0 refills | Status: DC | PRN

## 2016-10-19 MED ORDER — SUCRALFATE 1 GM/10ML PO SUSP: 1.0000 g | Freq: Three times a day (TID) | ORAL | 0 refills | Status: DC

## 2016-10-19 MED ORDER — FLUTICASONE PROPIONATE (INHAL) 100 MCG/BLIST IN AEPB: 1.0000 | INHALATION_SPRAY | Freq: Two times a day (BID) | RESPIRATORY_TRACT | 0 refills | Status: DC

## 2016-10-21 ENCOUNTER — Encounter: Payer: Self-pay | Admitting: Family Medicine

## 2016-10-21 ENCOUNTER — Ambulatory Visit (INDEPENDENT_AMBULATORY_CARE_PROVIDER_SITE_OTHER): Payer: BLUE CROSS/BLUE SHIELD | Admitting: Family Medicine

## 2016-10-21 VITALS — BP 137/86 | HR 98 | Temp 98.7°F | Ht 63.0 in | Wt 150.2 lb

## 2016-10-21 DIAGNOSIS — R600 Localized edema: Secondary | ICD-10-CM

## 2016-10-21 DIAGNOSIS — E876 Hypokalemia: Secondary | ICD-10-CM

## 2016-10-21 DIAGNOSIS — D860 Sarcoidosis of lung: Secondary | ICD-10-CM | POA: Diagnosis not present

## 2016-10-21 DIAGNOSIS — R0602 Shortness of breath: Secondary | ICD-10-CM

## 2016-10-21 MED ORDER — IPRATROPIUM-ALBUTEROL 0.5-2.5 (3) MG/3ML IN SOLN: 3.0000 mL | RESPIRATORY_TRACT | 0 refills | Status: DC | PRN

## 2016-10-21 MED ORDER — IPRATROPIUM-ALBUTEROL 0.5-2.5 (3) MG/3ML IN SOLN
3.0000 mL | Freq: Once | RESPIRATORY_TRACT | Status: AC
  Administered 2016-10-21: 3 mL via RESPIRATORY_TRACT

## 2016-10-21 NOTE — Progress Notes (Signed)
[place] at [facility] [address] [place], [place] [address] [id number] [phone]  Date:  10/21/2016   Name:  Alicia [name]   DOB:  19-Apr-1961   MRN:  [id number]  PCP:  Lamar Blinks, MD    Chief Complaint: Hospitalization Follow-up (Pt here for hospital f/u. Pt was seen on 10/18/16 for cough. )   History of Present Illness:  Alicia [name] is a 56 y.o. very pleasant female patient who presents with the following: History of sarcoidosis and asthma as well as portal hypertension/ congenital hepatic fibrosis I saw her on 1/4 for bronchitis- started her on doxycycline.  However she did not improve, went to ED on 1/10 and noted to have low grade fevers up to 100.9.   She was changed to levaquin.  She also had a CT of her chest that looked ok/ no PE as below: 10/09/16 IMPRESSION: 1. Stable appearance of numerous bilateral pulmonary nodules. The differential diagnosis please multifocal adenocarcinoma or papillomatosis. Granulomatous cysts or rheumatoid nodules are also considered. This most likely represents sarcoid given the patient's positive history. 2. No significant progression of lung disease. 3. No pulmonary embolus. 4. Similar appearance of large gastroesophageal varices. 5. Hepatic cirrhosis with splenomegaly.  On 1/19 she saw her GI provider at [facility]- they also referred her to pulmonology for further eval of her lung disease Went back to the ER on 1/19 as well as she was finishing the levaquin and was not feeling that well.  They repeated a CT angio as below 10/19/16 IMPRESSION: 1. No pulmonary embolus. 2. Stable appearance of multiple pulmonary nodules. 3. Mild central bronchial thickening.  No pneumonia. 4. Stable upper abdominal findings with hepatic cirrhosis and multiple upper abdominal varices.  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  She would prefer not to have to increase her dose if possible.  She did also get a dose of solumedrol in the ER on 1/19 and a 5 day pred taper She uses dulera, and does have a rescue inhaler.  Albuterol only She was given a duoneb in the ER on 1/10- this really helped her and she would like to do this today as well if we can.  At this time she no longer has fevers but notes that she is still having a hard time breathing.  She feels like she is not moving air well/.  Some cough No GI symptoms Her weight is stable- she is on high doses of diuretics for LE edema due to her liver disease Lasix 120/d Spiro 300/d  Wt Readings from Last 3 Encounters:  10/21/16 150 lb 3.2 oz (68.1 kg)  10/18/16 150 lb (68 kg)  10/09/16 148 lb (67.1 kg)    Patient Active Problem List   Diagnosis Date Noted  . Cellulitis 06/28/2016  . Thrombocytopenia ([place]) 06/28/2016  . AKI (acute kidney injury) ([place]) 06/28/2016  . Multiple pulmonary nodules determined by computed tomography of lung   . Chronic cough 09/07/2015  . Hypertension, portal ([place]) 09/04/2011  . Asthma with chronic obstructive pulmonary disease (COPD) ([place]) 09/04/2011  . 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 GERD (gastroesophageal reflux disease) 07/04/2011  . Sarcoid ([place]) 07/01/2011  . Asthma, severe persistent, with upper airway instability 06/13/2011  . 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 Allergy   . Anemia   .  Asthma   . Cellulitis   . Hepatic fibrosis ([place])   . Sarcoidosis [facility])     Past Surgical History:  Procedure Laterality Date  . SPINAL FUSION  12/2007   Double spinal Fusion  . VIDEO BRONCHOSCOPY Bilateral 12/08/2015   Procedure: VIDEO BRONCHOSCOPY WITH FLUORO;  Surgeon: Rigoberto Noel, MD;  Location: [facility];  Service: Cardiopulmonary;  Laterality: Bilateral;    Social History  Substance Use Topics  . Smoking status: Never Smoker  . Smokeless tobacco: Never Used   . Alcohol use No    Family History  Problem Relation Age of Onset  . Arthritis Mother   . Heart disease Father     Triple bypass  . Migraines Father   . Heart attack Paternal Grandmother   . Stroke Paternal Grandfather   . Liver disease Sister     Juvenile cysitc liver disease  . Asthma Sister     Allergies  Allergen Reactions  . Aspirin Other (See Comments)    Causes Asthma  . Dairy Aid [Lactase]     Causes Asthma attacks  . Eggs Or Egg-Derived Products     Causes Asthma  . Azithromycin Nausea And Vomiting  . Chocolate     Asthma attack, hives  . Gabapentin Other (See Comments)    Blisters in mouth  . Peanut-Containing Drug Products     Asthma, eye swelling, itchy eyes, can't catch her breath    Medication list has been reviewed and updated.  Current Outpatient Prescriptions on File Prior to Visit  Medication Sig Dispense Refill  . albuterol (PROVENTIL HFA;VENTOLIN HFA) 108 (90 Base) MCG/ACT inhaler Inhale 1-2 puffs into the lungs every 6 (six) hours as needed for wheezing or shortness of breath. 1 Inhaler 0  . benzonatate (TESSALON) 100 MG capsule Take 1 capsule (100 mg total) by mouth every 8 (eight) hours. 21 capsule 0  . Fluticasone Propionate, Inhal, (FLOVENT DISKUS) 100 MCG/BLIST AEPB Inhale 1 Inhaler into the lungs 2 (two) times daily. 1 each 0  . furosemide (LASIX) 40 MG tablet Take 120 mg by mouth daily.    Marland Kitchen HYDROcodone-homatropine (HYCODAN) 5-1.5 MG/5ML syrup Take 5 mLs by mouth every 6 (six) hours as needed for cough. 120 mL 0  . lactulose (CHRONULAC) 10 GM/15ML solution Take 20 g by mouth 2 (two) times daily.    . mometasone-formoterol (DULERA) 100-5 MCG/ACT AERO Inhale 2 puffs into the lungs 2 (two) times daily.    . pantoprazole (PROTONIX) 40 MG tablet Take 1 tablet by mouth 2 (two) times daily.     . predniSONE (DELTASONE) 20 MG tablet 3 tabs po day one, then 2 po daily x 4 days 11 tablet 0  . ranitidine (ZANTAC) 300 MG tablet TAKE 1 TABLET AT BEDTIME  90 tablet 1  . spironolactone (ALDACTONE) 100 MG tablet Take 300 mg by mouth daily.     . sucralfate (CARAFATE) 1 GM/10ML suspension Take 10 mLs (1 g total) by mouth 4 (four) times daily -  with meals and at bedtime. 420 mL 0   No current facility-administered medications on file prior to visit.     Review of Systems:  As per HPI- otherwise negative.   Physical Examination: Vitals:   10/21/16 1356  BP: 137/86  Pulse: 98  Temp: 98.7 F (37.1 C)  O2 sat is 97% Vitals:   10/21/16 1356  Weight: 150 lb 3.2 oz (68.1 kg)  Height: 5\' 3"  (1.6 m)   Body mass index is 26.61 kg/m.  Ideal Body Weight: Weight in (lb) to have BMI = 25: 140.8  GEN: WDWN, NAD, Non-toxic, A & O x 3, looks well except she appears to be working harder than normal to breathe HEENT: Atraumatic, Normocephalic. Neck supple. No masses, No LAD.  Bilateral TM wnl, oropharynx normal.  PEERL,EOMI.   Ears and Nose: No external deformity. CV: RRR, No M/G/R. No JVD. No thrill. No extra heart sounds. PULM: lungs are clear but decreased air movement. No retractions. No resp. distress. No accessory muscle use. ABD: S, NT, ND, EXTR: No c/c.  Mild edema of both feet and ankles- 1+ max.  She is wearing compression stockings NEURO Normal gait.  PSYCH: Normally interactive. Conversant. Not depressed or anxious appearing.  Calm demeanor.   Given a duoneb treatment- this helped her quite a bit and she felt that her breathing was much improved.  She was breathing easily, moving good air on exam  Assessment and Plan: Shortness of breath - Plan: ipratropium-albuterol (DUONEB) 0.5-2.5 (3) MG/3ML nebulizer solution 3 mL, ipratropium-albuterol (DUONEB) 0.5-2.5 (3) MG/3ML SOLN  Congenital hepatic fibrosis  Bilateral edema of lower extremity  Pulmonary sarcoidosis (HCC)  Here today to follow-up on a recent lung problem.  She has been dx with sarcoid and is seeing pulmonology this spring. For now she is on advair and has responded  very well to in office duoneb.   Will rx a neb machine for her to use at home so she may continue treatments as needed Will keep in touch with her to monitor her progress- she will also need a K check in the next couple of days since adding duoneb  Signed Lamar Blinks, MD

## 2016-10-21 NOTE — Patient Instructions (Addendum)
Please go to [facility] to pick up a nebulizer machine- they are not covered by insurance and are about $30 Use the nebulizer treatment as needed for your shortness of breath. If you are not getting better over the next few days please let me know!   Address: [address], [place], [place] [address]   Phone: [phone]

## 2016-10-21 NOTE — Progress Notes (Signed)
Pre visit review using our clinic review tool, if applicable. No additional management support is needed unless otherwise documented below in the visit note. 

## 2016-10-22 NOTE — Telephone Encounter (Signed)
Called pt- she is doing better.  She will come in for a K level in the next week to monitor

## 2016-11-04 ENCOUNTER — Encounter (HOSPITAL_BASED_OUTPATIENT_CLINIC_OR_DEPARTMENT_OTHER): Payer: Self-pay

## 2016-11-04 ENCOUNTER — Emergency Department (HOSPITAL_BASED_OUTPATIENT_CLINIC_OR_DEPARTMENT_OTHER)
Admission: EM | Admit: 2016-11-04 | Discharge: 2016-11-04 | Disposition: A | Discharge status: Home / Self Care | Payer: BLUE CROSS/BLUE SHIELD | Attending: Emergency Medicine | Admitting: Emergency Medicine

## 2016-11-04 DIAGNOSIS — S81812A Laceration without foreign body, left lower leg, initial encounter: Secondary | ICD-10-CM | POA: Diagnosis present

## 2016-11-04 DIAGNOSIS — Y929 Unspecified place or not applicable: Secondary | ICD-10-CM | POA: Insufficient documentation

## 2016-11-04 DIAGNOSIS — Z79899 Other long term (current) drug therapy: Secondary | ICD-10-CM | POA: Diagnosis not present

## 2016-11-04 DIAGNOSIS — Y999 Unspecified external cause status: Secondary | ICD-10-CM | POA: Diagnosis not present

## 2016-11-04 DIAGNOSIS — W268XXA Contact with other sharp object(s), not elsewhere classified, initial encounter: Secondary | ICD-10-CM | POA: Insufficient documentation

## 2016-11-04 DIAGNOSIS — J45909 Unspecified asthma, uncomplicated: Secondary | ICD-10-CM | POA: Diagnosis not present

## 2016-11-04 DIAGNOSIS — Y939 Activity, unspecified: Secondary | ICD-10-CM | POA: Diagnosis not present

## 2016-11-04 MED ORDER — LIDOCAINE-EPINEPHRINE 2 %-1:100000 IJ SOLN: 20.0000 mL | Freq: Once | INTRAMUSCULAR | Status: DC

## 2016-11-04 MED ORDER — IBUPROFEN 400 MG PO TABS
400.0000 mg | ORAL_TABLET | Freq: Once | ORAL | Status: AC
  Administered 2016-11-04: 400 mg via ORAL
  Filled 2016-11-04: qty 1

## 2016-11-04 MED ORDER — LIDOCAINE HCL 2 % IJ SOLN
INTRAMUSCULAR | Status: AC
  Administered 2016-11-04: 23:00:00
  Filled 2016-11-04: qty 20

## 2016-11-04 NOTE — ED Provider Notes (Signed)
[facility] DEPT M[facility] Provider Note   CSN: [id number] Arrival date & time: 11/04/16  1503  By signing my name below, I, Alicia [name], attest that this documentation has been prepared under the direction and in the presence of [facility], PA-C. Electronically Signed: Dora [name], Scribe. 11/04/2016. 8:08 PM.  History   Chief Complaint Chief Complaint  Patient presents with  . Leg Injury    The history is provided by the patient. No language interpreter was used.     HPI Comments: Alicia [name] is a 56 y.o. female with PMHx significant for hepatic fibrosis and thrombocytopenia who presents to the Emergency Department complaining of a laceration to her left lower leg sustained about an hour PTA. She states she inadvertently struck her left lower leg on a car bumper and lacerated it. No falls. Pt states she bled from the wound site initially but notes the bleeding is now controlled with gauze and a bandage. She reports pain secondary to the wound. She states her last tetanus vaccination was 7-10 years ago, however chart review shows tetanus updated at ER visit in 2017. Pt denies numbness/tingling, muscle weakness or any other associated symptoms.  Past Medical History:  Diagnosis Date  . Allergy   . Anemia   . Asthma   . Cellulitis   . Hepatic fibrosis ([place])   . Sarcoidosis [facility])     Patient Active Problem List   Diagnosis Date Noted  . Cellulitis 06/28/2016  . Thrombocytopenia ([place]) 06/28/2016  . AKI (acute kidney injury) ([place]) 06/28/2016  . Multiple pulmonary nodules determined by computed tomography of lung   . Chronic cough 09/07/2015  . Hypertension, portal ([place]) 09/04/2011  . Asthma with chronic obstructive pulmonary disease (COPD) ([place]) 09/04/2011  . 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 GERD (gastroesophageal reflux disease) 07/04/2011  . Sarcoid ([place]) 07/01/2011  . Asthma, severe persistent, with upper airway instability 06/13/2011  . Congenital hepatic fibrosis  07/03/2000    Past Surgical History:  Procedure Laterality Date  . SPINAL FUSION  12/2007   Double spinal Fusion  . VIDEO BRONCHOSCOPY Bilateral 12/08/2015   Procedure: VIDEO BRONCHOSCOPY WITH FLUORO;  Surgeon: Rigoberto Noel, MD;  Location: [place];  Service: Cardiopulmonary;  Laterality: Bilateral;    OB History    No data available       Home Medications    Prior to Admission medications   Medication Sig Start Date End Date Taking? Authorizing Provider  albuterol (ACCUNEB) 1.25 MG/3ML nebulizer solution Take 1 ampule by nebulization every 6 (six) hours as needed for wheezing.   Yes Historical Provider, MD  albuterol (PROVENTIL HFA;VENTOLIN HFA) 108 (90 Base) MCG/ACT inhaler Inhale 1-2 puffs into the lungs every 6 (six) hours as needed for wheezing or shortness of breath. 10/19/16   April Palumbo, MD  benzonatate (TESSALON) 100 MG capsule Take 1 capsule (100 mg total) by mouth every 8 (eight) hours. 10/19/16   April Palumbo, MD  Fluticasone Propionate, Inhal, (FLOVENT DISKUS) 100 MCG/BLIST AEPB Inhale 1 Inhaler into the lungs 2 (two) times daily. 10/19/16   April Palumbo, MD  furosemide (LASIX) 40 MG tablet Take 120 mg by mouth daily.    Historical Provider, MD  HYDROcodone-homatropine (HYCODAN) 5-1.5 MG/5ML syrup Take 5 mLs by mouth every 6 (six) hours as needed for cough. 10/09/16   Merrily Pew, MD  ipratropium-albuterol (DUONEB) 0.5-2.5 (3) MG/3ML SOLN Take 3 mLs by nebulization every 4 (four) hours as needed. 10/21/16   Darreld Mclean, MD  lactulose ([place]) 10  GM/15ML solution Take 20 g by mouth 2 (two) times daily. 06/27/16   Historical Provider, MD  mometasone-formoterol (DULERA) 100-5 MCG/ACT AERO Inhale 2 puffs into the lungs 2 (two) times daily.    Historical Provider, MD  pantoprazole (PROTONIX) 40 MG tablet Take 1 tablet by mouth 2 (two) times daily.  04/05/11   Historical Provider, MD  predniSONE (DELTASONE) 20 MG tablet 3 tabs po day one, then 2 po daily x 4 days  10/19/16   April Palumbo, MD  ranitidine (ZANTAC) 300 MG tablet TAKE 1 TABLET AT BEDTIME 06/26/16   Rigoberto Noel, MD  spironolactone (ALDACTONE) 100 MG tablet Take 300 mg by mouth daily.     Historical Provider, MD  sucralfate (CARAFATE) 1 GM/10ML suspension Take 10 mLs (1 g total) by mouth 4 (four) times daily -  with meals and at bedtime. 10/19/16   Veatrice Kells, MD    Family History Family History  Problem Relation Age of Onset  . Arthritis Mother   . Heart disease Father     Triple bypass  . Migraines Father   . Heart attack Paternal Grandmother   . Stroke Paternal Grandfather   . Liver disease Sister     Juvenile cysitc liver disease  . Asthma Sister     Social History Social History  Substance Use Topics  . Smoking status: Never Smoker  . Smokeless tobacco: Never Used  . Alcohol use No     Allergies   Aspirin; Dairy aid [lactase]; Eggs or egg-derived products; Azithromycin; Chocolate; Gabapentin; and Peanut-containing drug products   Review of Systems Review of Systems  Musculoskeletal: Positive for myalgias.  Skin: Positive for wound.  All other systems reviewed and are negative.    Physical Exam Updated Vital Signs BP 113/65 (BP Location: Right Arm)   Pulse 89   Temp 99.5 F (37.5 C) (Oral)   Resp 20   Ht 5\' 3"  (1.6 m)   Wt 68 kg   SpO2 97%   BMI 26.57 kg/m   Physical Exam  Constitutional: She is oriented to person, place, and time. She appears well-developed and well-nourished. No distress.  HENT:  Head: Normocephalic and atraumatic.  Eyes: Conjunctivae and EOM are normal.  Neck: Neck supple. No tracheal deviation present.  Cardiovascular: Normal rate.   Pulmonary/Chest: Effort normal. No respiratory distress.  Musculoskeletal: Normal range of motion.  Neurological: She is alert and oriented to person, place, and time.  Skin: Skin is warm and dry. Laceration noted.  5 cm laceration to the lateral aspect of the left lower extremity with no  surrounding erythema.  Psychiatric: She has a normal mood and affect. Her behavior is normal.  Nursing note and vitals reviewed.   ED Treatments / Results  Labs (all labs ordered are listed, but only abnormal results are displayed) Labs Reviewed - No data to display  EKG  EKG Interpretation None       Radiology No results found.  Procedures .Marland KitchenLaceration Repair Date/Time: 11/04/2016 10:08 PM Performed by: Seward Speck Authorized by: Seward Speck   Consent:    Consent obtained:  Verbal   Consent given by:  Patient Anesthesia (see MAR for exact dosages):    Anesthesia method:  Local infiltration   Local anesthetic:  Lidocaine 2% WITH epi (5 mL) Laceration details:    Location:  Leg   Leg location:  L lower leg   Length (cm):  5 Repair type:    Repair type:  Intermediate Pre-procedure details:  Preparation:  Patient was prepped and draped in usual sterile fashion Treatment:    Area cleansed with:  Betadine   Amount of cleaning:  Standard   Irrigation solution:  Sterile saline   Irrigation method:  Syringe Skin repair:    Repair method:  Sutures   Suture size:  6-0   Suture material:  Nylon   Suture technique:  Horizontal mattress   Number of sutures:  4 Approximation:    Approximation:  Close   Vermilion border: well-aligned   Post-procedure details:    Dressing: compression.   Patient tolerance of procedure:  Tolerated well, no immediate complications Comments:     1 simple interrupted 6-0 suture and 1 steri-strip also placed.    (including critical care time)  DIAGNOSTIC STUDIES: Oxygen Saturation is 100% on RA, normal by my interpretation.    COORDINATION OF CARE: 8:14 PM Discussed treatment plan with pt at bedside and pt agreed to plan.  Medications Ordered in ED Medications  lidocaine-EPINEPHrine (XYLOCAINE W/EPI) 2 %-1:100000 (with pres) injection 20 mL (not administered)  ibuprofen (ADVIL,MOTRIN) tablet 400 mg (400 mg Oral Given  11/04/16 1925)  lidocaine (XYLOCAINE) 2 % (with pres) injection (  Given by Other 11/04/16 2233)     Initial Impression / Assessment and Plan / ED Course  I have reviewed the triage vital signs and the nursing notes.  Pertinent labs & imaging results that were available during my care of the patient were reviewed by me and considered in my medical decision making (see chart for details).   Suhana Crete presents to ED for laceration of left lower extremity. Laceration repaired as dictated above. Patient counseled on home wound care. Discussed prophylactic ABX vs. Close monitoring and follow up for any signs of infection. Patient opts to defer ABX treatment at this time. Per chart review, patient received a tetanus vaccine on 06/27/16. Follow up with PCP/urgent care or return to ER for suture removal in 7 days. Patient was urged to return to the Emergency Department for worsening pain, swelling, expanding erythema especially if it streaks away from the affected area, fever, or for any additional concerns. Patient verbalized understanding. All questions answered.    Final Clinical Impressions(s) / ED Diagnoses   Final diagnoses:  Laceration of left lower leg, initial encounter    New Prescriptions Discharge Medication List as of 11/04/2016 10:11 PM     I personally performed the services described in this documentation, which was scribed in my presence. The recorded information has been reviewed and is accurate.    [facility] Josely Moffat, PA-C 11/05/16 0020    Drenda Freeze, MD 11/05/16 [phone]

## 2016-11-04 NOTE — ED Triage Notes (Signed)
Pt state she cut left LE on ?car approx 1 hour PAT-dsg intact with no bleed through-pt reports hx of multiple skin breaks due to liver disease-NAD-steady slow gait

## 2016-11-04 NOTE — ED Notes (Signed)
Pt in wheelchair.  Pt comfortable.  NAD noted.

## 2016-11-04 NOTE — Discharge Instructions (Signed)
Keep wound clean with mild soap and water. Keep area covered with a topical antibiotic ointment and bandage, keep bandage dry, and do not submerge in water for 24 hours. Ice and elevate for additional pain relief and swelling. Alternate between ibuprofen and Tylenol for additional pain relief. Follow up with your primary care doctor or the [facility] [place] in approximately 7-10 days for wound recheck and suture removal. Monitor area for signs of infection to include, but not limited to: increasing pain, spreading redness, drainage/pus, worsening swelling, or fevers. Return to emergency department for emergent changing or worsening symptoms.   WOUND CARE Keep area clean and dry for 24 hours. Do not remove bandage, if applied. After 24 hours,you should change it at least once a day. Also, change the dressing if it becomes wet or dirty, or as directed by your caregiver.  Wash the wound with soap and water 2 times a day. Rinse the wound off with water to remove all soap. Pat the wound dry with a clean towel.  You may shower as usual after the first 24 hours. Do not soak the wound in water until the sutures are removed.  Once the wound has healed, scarring can be minimized by covering the wound with sunscreen during the day for 1 full year. Do not apply any ointments or creams to the wound while stitches/staples are in place, as this may cause delayed healing. Return if you experience any of the following signs of infection: Swelling, redness, pus drainage, streaking, fever >101.0 F Return if you experience excessive bleeding that does not stop after 15-20 minutes of constant, firm pressure.

## 2016-11-04 NOTE — ED Notes (Signed)
Lac to left lower leg on  Car,  Arrived w dressing intact  Bleeding controlled

## 2016-11-07 ENCOUNTER — Other Ambulatory Visit (INDEPENDENT_AMBULATORY_CARE_PROVIDER_SITE_OTHER): Payer: BLUE CROSS/BLUE SHIELD

## 2016-11-07 ENCOUNTER — Other Ambulatory Visit: Payer: Self-pay | Admitting: Family Medicine

## 2016-11-07 DIAGNOSIS — E876 Hypokalemia: Secondary | ICD-10-CM

## 2016-11-07 DIAGNOSIS — D7589 Other specified diseases of blood and blood-forming organs: Secondary | ICD-10-CM | POA: Diagnosis not present

## 2016-11-07 DIAGNOSIS — Z5181 Encounter for therapeutic drug level monitoring: Secondary | ICD-10-CM

## 2016-11-07 LAB — CBC
HCT: 32.5 % — ABNORMAL LOW (ref 36.0–46.0)
Hemoglobin: 11.2 g/dL — ABNORMAL LOW (ref 12.0–15.0)
MCHC: 34.4 g/dL (ref 30.0–36.0)
MCV: 101.6 fl — ABNORMAL HIGH (ref 78.0–100.0)
PLATELETS: 77 10*3/uL — AB (ref 150.0–400.0)
RBC: 3.2 Mil/uL — AB (ref 3.87–5.11)
RDW: 14.8 % (ref 11.5–15.5)
WBC: 4.4 10*3/uL (ref 4.0–10.5)

## 2016-11-07 LAB — FOLATE: FOLATE: 11.9 ng/mL (ref >5.9)

## 2016-11-07 LAB — BASIC METABOLIC PANEL
BUN: 16 mg/dL (ref 6–23)
CHLORIDE: 108 meq/L (ref 96–112)
CO2: 26 mEq/L (ref 19–32)
CREATININE: 1.31 mg/dL — AB (ref 0.40–1.20)
Calcium: 8 mg/dL — ABNORMAL LOW (ref 8.4–10.5)
GFR: 44.32 mL/min — ABNORMAL LOW (ref >60.00)
Glucose, Bld: 249 mg/dL — ABNORMAL HIGH (ref 70–99)
POTASSIUM: 3.3 meq/L — AB (ref 3.5–5.1)
SODIUM: 139 meq/L (ref 135–145)

## 2016-11-07 LAB — POTASSIUM: POTASSIUM: 3.3 meq/L — AB (ref 3.5–5.1)

## 2016-11-07 LAB — VITAMIN B12: Vitamin B-12: 791 pg/mL (ref 211–911)

## 2016-11-08 ENCOUNTER — Encounter: Payer: Self-pay | Admitting: Family Medicine

## 2016-11-08 ENCOUNTER — Ambulatory Visit (INDEPENDENT_AMBULATORY_CARE_PROVIDER_SITE_OTHER): Payer: BLUE CROSS/BLUE SHIELD | Admitting: Family Medicine

## 2016-11-08 ENCOUNTER — Ambulatory Visit (HOSPITAL_BASED_OUTPATIENT_CLINIC_OR_DEPARTMENT_OTHER)
Admission: RE | Admit: 2016-11-08 | Discharge: 2016-11-08 | Disposition: A | Discharge status: Home / Self Care | Payer: BLUE CROSS/BLUE SHIELD | Source: Ambulatory Visit | Attending: Family Medicine | Admitting: Family Medicine

## 2016-11-08 VITALS — BP 118/60 | HR 84 | Temp 98.6°F | Ht 63.0 in | Wt 165.2 lb

## 2016-11-08 DIAGNOSIS — M79661 Pain in right lower leg: Secondary | ICD-10-CM

## 2016-11-08 DIAGNOSIS — K766 Portal hypertension: Secondary | ICD-10-CM

## 2016-11-08 NOTE — Progress Notes (Signed)
Chief Complaint  Patient presents with  . Leg Pain    (B) pt fell 2 weeks ago    Subjective: Patient is a 56 y.o. female here for b/l leg pain.  She had a fall 2 weeks ago and landed on her R calf as she fell down some stairs. It immediately bruised. Since then, she has been having more swelling and pain b/l, but worse on the R. Her hepatologist wanted her to be evaluated with PCP to ensure she does not have a DVT. She does have a cough, but has sarcoid, asthma and is getting over bronchitis. She is not having any chest pain or shortness of breath.   ROS: Heart: Denies chest pain or palpitations, +LE edema, worse on R Lungs: Denies SOB, +cough  Family History  Problem Relation Age of Onset  . Arthritis Mother   . Heart disease Father     Triple bypass  . Migraines Father   . Heart attack Paternal Grandmother   . Stroke Paternal Grandfather   . Liver disease Sister     Juvenile cysitc liver disease  . Asthma Sister    Past Medical History:  Diagnosis Date  . Allergy   . Anemia   . Asthma   . Cellulitis   . Hepatic fibrosis ([place])   . Sarcoidosis ([place])    Allergies  Allergen Reactions  . Aspirin Other (See Comments)    Causes Asthma  . Dairy Aid [Lactase]     Causes Asthma attacks  . Eggs Or Egg-Derived Products     Causes Asthma  . Azithromycin Nausea And Vomiting  . Chocolate     Asthma attack, hives  . Gabapentin Other (See Comments)    Blisters in mouth  . Peanut-Containing Drug Products     Asthma, eye swelling, itchy eyes, can't catch her breath    Current Outpatient Prescriptions:  .  albuterol (ACCUNEB) 1.25 MG/3ML nebulizer solution, Take 1 ampule by nebulization every 6 (six) hours as needed for wheezing., Disp: , Rfl:  .  albuterol (PROVENTIL HFA;VENTOLIN HFA) 108 (90 Base) MCG/ACT inhaler, Inhale 1-2 puffs into the lungs every 6 (six) hours as needed for wheezing or shortness of breath., Disp: 1 Inhaler, Rfl: 0 .  Fluticasone Propionate, Inhal,  (FLOVENT DISKUS) 100 MCG/BLIST AEPB, Inhale 1 Inhaler into the lungs 2 (two) times daily., Disp: 1 each, Rfl: 0 .  furosemide (LASIX) 40 MG tablet, Take 120 mg by mouth daily., Disp: , Rfl:  .  ipratropium-albuterol (DUONEB) 0.5-2.5 (3) MG/3ML SOLN, Take 3 mLs by nebulization every 4 (four) hours as needed., Disp: 360 mL, Rfl: 0 .  lactulose (CHRONULAC) 10 GM/15ML solution, Take 20 g by mouth 2 (two) times daily., Disp: , Rfl:  .  mometasone-formoterol (DULERA) 100-5 MCG/ACT AERO, Inhale 2 puffs into the lungs 2 (two) times daily., Disp: , Rfl:  .  pantoprazole (PROTONIX) 40 MG tablet, Take 1 tablet by mouth 2 (two) times daily. , Disp: , Rfl:  .  ranitidine (ZANTAC) 300 MG tablet, TAKE 1 TABLET AT BEDTIME, Disp: 90 tablet, Rfl: 1 .  spironolactone (ALDACTONE) 100 MG tablet, Take 300 mg by mouth daily. , Disp: , Rfl:   Objective: BP 118/60 (BP Location: Left Arm, Patient Position: Sitting, Cuff Size: Normal)   Pulse 84   Temp 98.6 F (37 C) (Oral)   Ht 5\' 3"  (1.6 m)   Wt 165 lb 3.2 oz (74.9 kg)   SpO2 98%   BMI 29.26 kg/m  General: Awake,  appears stated age 78: MMM, EOMi Heart: RRR, no murmurs, 4+ pitting edema that tapers to 1+ up to distal 1/3 of R thigh, 3+ pitting edema on L that goes to prox portion of knee Lungs: CTAB, no rales, wheezes or rhonchi. No accessory muscle use MSK: +calf tenderness on R (entire leg TTP b/l), neg Homan's b/l Psych: Age appropriate judgment and insight, normal affect and mood  Assessment and Plan: Right calf pain - Plan: US Venous Img Lower Unilateral Right  Orders as above. Given the asymmetric swelling, pain and hx of trauma, will obtain duplex to r/o clot. I have given my personal cell phone number for the results network and will contact her to potentially start anticoagulation should she have a DVT. She was instructed to answer a blocked number. If results are negative, she is told that there will be no call until next week on Monday. F/u prn  otherwise. The patient voiced understanding and agreement to the plan.  [place], DO 11/08/16  3:49 PM

## 2016-11-08 NOTE — Patient Instructions (Addendum)
Your results will likely be final this evening. If everything is normal, we will not reach out to you (no news is good news). If there is anything that requires management, I will call you from a blocked number. Please answer your blocked calls tonight.  Keep walking as much as able.

## 2016-11-08 NOTE — Progress Notes (Signed)
Pre visit review using our clinic review tool, if applicable. No additional management support is needed unless otherwise documented below in the visit note. 

## 2016-11-11 ENCOUNTER — Telehealth: Payer: Self-pay | Admitting: Family Medicine

## 2016-11-11 ENCOUNTER — Other Ambulatory Visit (INDEPENDENT_AMBULATORY_CARE_PROVIDER_SITE_OTHER): Payer: BLUE CROSS/BLUE SHIELD

## 2016-11-11 DIAGNOSIS — K766 Portal hypertension: Secondary | ICD-10-CM

## 2016-11-12 LAB — BASIC METABOLIC PANEL
BUN: 9 mg/dL (ref 6–23)
CO2: 29 mEq/L (ref 19–32)
CREATININE: 0.98 mg/dL (ref 0.40–1.20)
Calcium: 8.2 mg/dL — ABNORMAL LOW (ref 8.4–10.5)
Chloride: 105 mEq/L (ref 96–112)
GFR: 61.96 mL/min (ref >60.00)
GLUCOSE: 143 mg/dL — AB (ref 70–99)
Potassium: 3.5 mEq/L (ref 3.5–5.1)
SODIUM: 137 meq/L (ref 135–145)

## 2016-11-13 ENCOUNTER — Ambulatory Visit: Payer: BLUE CROSS/BLUE SHIELD | Admitting: Family Medicine

## 2016-11-14 ENCOUNTER — Ambulatory Visit: Payer: BLUE CROSS/BLUE SHIELD | Admitting: Family Medicine

## 2016-11-17 ENCOUNTER — Other Ambulatory Visit: Payer: Self-pay | Admitting: Family Medicine

## 2016-11-17 DIAGNOSIS — R0602 Shortness of breath: Secondary | ICD-10-CM

## 2016-12-12 ENCOUNTER — Encounter: Payer: Self-pay | Admitting: Emergency Medicine

## 2016-12-19 ENCOUNTER — Encounter: Payer: Self-pay | Admitting: Family Medicine

## 2016-12-19 ENCOUNTER — Telehealth: Payer: Self-pay | Admitting: Family Medicine

## 2016-12-19 ENCOUNTER — Other Ambulatory Visit (INDEPENDENT_AMBULATORY_CARE_PROVIDER_SITE_OTHER): Payer: BLUE CROSS/BLUE SHIELD

## 2016-12-19 DIAGNOSIS — K766 Portal hypertension: Secondary | ICD-10-CM | POA: Diagnosis not present

## 2016-12-19 LAB — COMPREHENSIVE METABOLIC PANEL
ALBUMIN: 3 g/dL — AB (ref 3.5–5.2)
ALT: 26 U/L (ref 0–35)
AST: 37 U/L (ref 0–37)
Alkaline Phosphatase: 125 U/L — ABNORMAL HIGH (ref 39–117)
BUN: 16 mg/dL (ref 6–23)
CALCIUM: 9.1 mg/dL (ref 8.4–10.5)
CHLORIDE: 100 meq/L (ref 96–112)
CO2: 28 mEq/L (ref 19–32)
Creatinine, Ser: 1.46 mg/dL — ABNORMAL HIGH (ref 0.40–1.20)
GFR: 39.1 mL/min — ABNORMAL LOW (ref >60.00)
Glucose, Bld: 136 mg/dL — ABNORMAL HIGH (ref 70–99)
Potassium: 4 mEq/L (ref 3.5–5.1)
SODIUM: 134 meq/L — AB (ref 135–145)
Total Bilirubin: 2.6 mg/dL — ABNORMAL HIGH (ref 0.2–1.2)
Total Protein: 5.5 g/dL — ABNORMAL LOW (ref 6.0–8.3)

## 2016-12-19 LAB — PROTIME-INR
INR: 1.2 ratio — AB (ref 0.8–1.0)
PROTHROMBIN TIME: 12.7 s (ref 9.6–13.1)

## 2016-12-19 NOTE — Telephone Encounter (Signed)
Called [facility] about fax I received for [name]- I cannot really read it.  Staff looked up message- they want [place] to order a standing CMP and Pt/INR for her to do every 2 weeks and then fax results to her GI provider, Dr. Barron Schmid at [phone]  Will place orders for her

## 2016-12-20 ENCOUNTER — Other Ambulatory Visit: Payer: Self-pay | Admitting: Pulmonary Disease

## 2016-12-31 ENCOUNTER — Other Ambulatory Visit: Payer: Self-pay | Admitting: Pulmonary Disease

## 2017-01-02 ENCOUNTER — Other Ambulatory Visit (INDEPENDENT_AMBULATORY_CARE_PROVIDER_SITE_OTHER): Payer: BLUE CROSS/BLUE SHIELD

## 2017-01-02 DIAGNOSIS — K766 Portal hypertension: Secondary | ICD-10-CM

## 2017-01-02 LAB — COMPREHENSIVE METABOLIC PANEL
ALK PHOS: 131 U/L — AB (ref 39–117)
ALT: 27 U/L (ref 0–35)
AST: 31 U/L (ref 0–37)
Albumin: 3 g/dL — ABNORMAL LOW (ref 3.5–5.2)
BILIRUBIN TOTAL: 2.2 mg/dL — AB (ref 0.2–1.2)
BUN: 12 mg/dL (ref 6–23)
CALCIUM: 8.9 mg/dL (ref 8.4–10.5)
CO2: 29 mEq/L (ref 19–32)
Chloride: 99 mEq/L (ref 96–112)
Creatinine, Ser: 1.32 mg/dL — ABNORMAL HIGH (ref 0.40–1.20)
GFR: 43.91 mL/min — AB (ref >60.00)
Glucose, Bld: 134 mg/dL — ABNORMAL HIGH (ref 70–99)
POTASSIUM: 3.8 meq/L (ref 3.5–5.1)
Sodium: 133 mEq/L — ABNORMAL LOW (ref 135–145)
TOTAL PROTEIN: 5.6 g/dL — AB (ref 6.0–8.3)

## 2017-01-02 LAB — PROTIME-INR
INR: 1.2 ratio — ABNORMAL HIGH (ref 0.8–1.0)
PROTHROMBIN TIME: 12.8 s (ref 9.6–13.1)

## 2017-01-02 NOTE — Telephone Encounter (Signed)
Patient currently on her way to have labs conducted from Dr. Gertie Fey at [facility], chart doesn't reflect orders.please advise

## 2017-01-17 ENCOUNTER — Telehealth: Payer: Self-pay | Admitting: Family Medicine

## 2017-01-17 ENCOUNTER — Other Ambulatory Visit (INDEPENDENT_AMBULATORY_CARE_PROVIDER_SITE_OTHER): Payer: BLUE CROSS/BLUE SHIELD

## 2017-01-17 ENCOUNTER — Encounter: Payer: Self-pay | Admitting: Family Medicine

## 2017-01-17 DIAGNOSIS — K766 Portal hypertension: Secondary | ICD-10-CM

## 2017-01-17 LAB — COMPREHENSIVE METABOLIC PANEL
ALBUMIN: 2.7 g/dL — AB (ref 3.6–5.1)
ALT: 25 U/L (ref 6–29)
AST: 29 U/L (ref 10–35)
Alkaline Phosphatase: 129 U/L (ref 33–130)
BILIRUBIN TOTAL: 1.7 mg/dL — AB (ref 0.2–1.2)
BUN: 11 mg/dL (ref 7–25)
CALCIUM: 8.3 mg/dL — AB (ref 8.6–10.4)
CHLORIDE: 107 mmol/L (ref 98–110)
CO2: 22 mmol/L (ref 20–31)
CREATININE: 1.23 mg/dL — AB (ref 0.50–1.05)
GLUCOSE: 118 mg/dL — AB (ref 65–99)
Potassium: 3.7 mmol/L (ref 3.5–5.3)
SODIUM: 138 mmol/L (ref 135–146)
Total Protein: 5.1 g/dL — ABNORMAL LOW (ref 6.1–8.1)

## 2017-01-17 NOTE — Telephone Encounter (Signed)
Patient needs order for labs entered. States she gets labs every 2 weeks to check her Liver functions.

## 2017-01-17 NOTE — Addendum Note (Signed)
Addended by: Peggyann Shoals on: 01/17/2017 04:15 PM   Modules accepted: Orders

## 2017-01-17 NOTE — Telephone Encounter (Signed)
°  Relation to AL:PFXT Call back number: [phone]   Reason for call:   Dr. Lorelei Pont,  Patient would like to come in today and have labs drawn but chart doesn't reflect orders, please advise

## 2017-01-18 LAB — PROTIME-INR
INR: 1.2 — ABNORMAL HIGH
Prothrombin Time: 12.5 s — ABNORMAL HIGH (ref 9.0–11.5)

## 2017-01-20 ENCOUNTER — Ambulatory Visit (INDEPENDENT_AMBULATORY_CARE_PROVIDER_SITE_OTHER): Payer: BLUE CROSS/BLUE SHIELD | Admitting: Family Medicine

## 2017-01-20 VITALS — BP 102/64 | HR 98 | Temp 98.4°F | Ht 63.0 in | Wt 148.4 lb

## 2017-01-20 DIAGNOSIS — L03116 Cellulitis of left lower limb: Secondary | ICD-10-CM | POA: Diagnosis not present

## 2017-01-20 DIAGNOSIS — R600 Localized edema: Secondary | ICD-10-CM

## 2017-01-20 DIAGNOSIS — K766 Portal hypertension: Secondary | ICD-10-CM

## 2017-01-20 MED ORDER — CEPHALEXIN 500 MG PO CAPS: 500.0000 mg | ORAL_CAPSULE | Freq: Three times a day (TID) | ORAL | 0 refills | Status: DC

## 2017-01-20 NOTE — Progress Notes (Signed)
[facility] at [facility] [place], [place], [place] [address] [phone]  Date:  01/20/2017   Name:  Alicia [name]   DOB:  27-Jan-1961   MRN:  [id number]  PCP:  Lamar Blinks, MD    Chief Complaint: Cellulitis (c/o possible cellulitis on left leg. )   History of Present Illness:  Alicia [name] is a 56 y.o. very pleasant female patient who presents with the following:  Last seen by myself in January History of COPD, portal hypertension and chronic edema due to her liver disorder We do her labs on a regular basis for her hepatologist at [facility]- these have looked ok  She has hepatic fibrosis- they are thinking of doing a living donor transplant for her through [place].  She is excited about the prospect of having better long term health   She is also seeing pulmonology at [facility]- her breathing seems to be doing ok at this time.  They hope that her planned liver transplant may help with her lung condition as well.  She is thought to have sarcoid but there is some chance that she may have a different pulmonary condition related to her hepatic fibrosis  BP Readings from Last 3 Encounters:  01/20/17 102/64  11/08/16 118/60  11/04/16 113/65   Wt Readings from Last 3 Encounters:  01/20/17 148 lb 6.4 oz (67.3 kg)  11/08/16 165 lb 3.2 oz (74.9 kg)  11/04/16 150 lb (68 kg)   Her weight varies some with her level of fluid retention due to her liver disease. Right now she does not feel like she is too overloaded although her bilateral feet and ankles are swollen.  The skin on her legs is quite delicate and easily injured.  A few days ago she scratched her left shin with a fingernail and gouged out an area of skin.  She is concerned because this area is tender and a bit warm, and has some redness.  She is concerned about infection She has not had any systemic sign of infection such as fever, chills, or flu like illness.  She is keeping the  wound clean and covered- it is weeping a little bit   Patient Active Problem List   Diagnosis Date Noted  . Cellulitis 06/28/2016  . Thrombocytopenia ([place]) 06/28/2016  . AKI (acute kidney injury) ([place]) 06/28/2016  . Multiple pulmonary nodules determined by computed tomography of lung   . Chronic cough 09/07/2015  . Hypertension, portal ([place]) 09/04/2011  . Asthma with chronic obstructive pulmonary disease (COPD) ([place]) 09/04/2011  . 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 GERD (gastroesophageal reflux disease) 07/04/2011  . Sarcoid ([place]) 07/01/2011  . Asthma, severe persistent, with upper airway instability 06/13/2011  . 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 Allergy   . Anemia   . Asthma   . Cellulitis   . Hepatic fibrosis ([place])   . Sarcoidosis [facility])     Past Surgical History:  Procedure Laterality Date  . SPINAL FUSION  12/2007   Double spinal Fusion  . VIDEO BRONCHOSCOPY Bilateral 12/08/2015   Procedure: VIDEO BRONCHOSCOPY WITH FLUORO;  Surgeon: Rigoberto Noel, MD;  Location: [place];  Service: Cardiopulmonary;  Laterality: Bilateral;    Social History  Substance Use Topics  . Smoking status: Never Smoker  . Smokeless tobacco: Never Used  . Alcohol use No    Family History  Problem Relation Age of Onset  .  Arthritis Mother   . Heart disease Father     Triple bypass  . Migraines Father   . Heart attack Paternal Grandmother   . Stroke Paternal Grandfather   . Liver disease Sister     Juvenile cysitc liver disease  . Asthma Sister     Allergies  Allergen Reactions  . Aspirin Other (See Comments)    Causes Asthma  . Dairy Aid [Lactase]     Causes Asthma attacks  . Eggs Or Egg-Derived Products     Causes Asthma  . Azithromycin Nausea And Vomiting  . Chocolate     Asthma attack, hives  . Gabapentin Other (See Comments)    Blisters in mouth  . Peanut-Containing Drug Products     Asthma, eye swelling, itchy eyes,  can't catch her breath    Medication list has been reviewed and updated.  Current Outpatient Prescriptions on File Prior to Visit  Medication Sig Dispense Refill  . albuterol (ACCUNEB) 1.25 MG/3ML nebulizer solution Take 1 ampule by nebulization every 6 (six) hours as needed for wheezing.    Marland Kitchen albuterol (PROVENTIL HFA;VENTOLIN HFA) 108 (90 Base) MCG/ACT inhaler Inhale 1-2 puffs into the lungs every 6 (six) hours as needed for wheezing or shortness of breath. 1 Inhaler 0  . Fluticasone Propionate, Inhal, (FLOVENT DISKUS) 100 MCG/BLIST AEPB Inhale 1 Inhaler into the lungs 2 (two) times daily. 1 each 0  . furosemide (LASIX) 40 MG tablet Take 120 mg by mouth daily.    Marland Kitchen ipratropium-albuterol (DUONEB) 0.5-2.5 (3) MG/3ML SOLN TAKE 3 MLS BY NEBULIZATION EVERY 4 (FOUR) HOURS AS NEEDED. 360 mL 0  . lactulose (CHRONULAC) 10 GM/15ML solution Take 20 g by mouth 2 (two) times daily.    . mometasone-formoterol (DULERA) 100-5 MCG/ACT AERO Inhale 2 puffs into the lungs 2 (two) times daily.    . pantoprazole (PROTONIX) 40 MG tablet Take 1 tablet by mouth 2 (two) times daily.     . ranitidine (ZANTAC) 300 MG tablet TAKE 1 TABLET AT BEDTIME 90 tablet 1  . spironolactone (ALDACTONE) 100 MG tablet Take 300 mg by mouth daily.      No current facility-administered medications on file prior to visit.     Review of Systems:  As per HPI- otherwise negative.  Admits to feeling tired a lot of the time. She has cut down on her hours at work so she can rest more    Physical Examination: Vitals:   01/20/17 1151  BP: 102/64  Pulse: 98  Temp: 98.4 F (36.9 C)   Vitals:   01/20/17 1151  Weight: 148 lb 6.4 oz (67.3 kg)  Height: 5\' 3"  (1.6 m)   Body mass index is 26.29 kg/m. Ideal Body Weight: Weight in (lb) to have BMI = 25: 140.8  GEN: WDWN, NAD, Non-toxic, A & O x 3, looks like her normal self, in good spirits HEENT: Atraumatic, Normocephalic. Neck supple. No masses, No LAD. Ears and Nose: No external  deformity. CV: RRR, No M/G/R. No JVD. No thrill. No extra heart sounds. PULM: CTA B, no wheezes, crackles, rhonchi. No retractions. No resp. distress. No accessory muscle use. ABD: S, NT, ND EXTR: No c/c. She has very soft, pitting edema of both LE to the mid shins.  The skin is shiny and thin. Easily torn.  On the left anterior shin there is a less than dime sized skin defect with mild surrounding erythema of the skin.  This area is mildly tender NEURO Normal gait.  PSYCH:  Normally interactive. Conversant. Not depressed or anxious appearing.  Calm demeanor.   Cleaned wound with peroxide and dressed with a non- stick protective dressing  Assessment and Plan: Cellulitis of left anterior lower leg - Plan: cephALEXin (KEFLEX) 500 MG capsule  Portal hypertension (HCC)  Congenital hepatic fibrosis  Bilateral edema of lower extremity  Treat for mild cellulitis with keflex TID  She will let me know if not improving in the next 2-3 days- Sooner if worse.  Discussed how to dress and protect wound   Signed Lamar Blinks, MD

## 2017-01-20 NOTE — Patient Instructions (Signed)
We are going to start you on keflex for your leg- please let me know if you are not improving over the next 2-3 days; sooner if you are getting worse  Keep the wound clean and covered with a non- stick dressing when you are out. It is ok to let it air out some when you are at home however

## 2017-02-09 ENCOUNTER — Other Ambulatory Visit: Payer: Self-pay | Admitting: Pulmonary Disease

## 2017-02-14 ENCOUNTER — Other Ambulatory Visit (INDEPENDENT_AMBULATORY_CARE_PROVIDER_SITE_OTHER): Payer: BLUE CROSS/BLUE SHIELD

## 2017-02-14 DIAGNOSIS — K766 Portal hypertension: Secondary | ICD-10-CM

## 2017-02-14 LAB — COMPREHENSIVE METABOLIC PANEL
ALK PHOS: 188 U/L — AB (ref 33–130)
ALT: 47 U/L — AB (ref 6–29)
AST: 51 U/L — ABNORMAL HIGH (ref 10–35)
Albumin: 3 g/dL — ABNORMAL LOW (ref 3.6–5.1)
BUN: 14 mg/dL (ref 7–25)
CHLORIDE: 105 mmol/L (ref 98–110)
CO2: 22 mmol/L (ref 20–31)
Calcium: 8.2 mg/dL — ABNORMAL LOW (ref 8.6–10.4)
Creat: 1.26 mg/dL — ABNORMAL HIGH (ref 0.50–1.05)
GLUCOSE: 130 mg/dL — AB (ref 65–99)
POTASSIUM: 3.2 mmol/L — AB (ref 3.5–5.3)
Sodium: 140 mmol/L (ref 135–146)
Total Bilirubin: 1.8 mg/dL — ABNORMAL HIGH (ref 0.2–1.2)
Total Protein: 5.4 g/dL — ABNORMAL LOW (ref 6.1–8.1)

## 2017-02-15 LAB — PROTIME-INR
INR: 1.1
Prothrombin Time: 11.4 s (ref 9.0–11.5)

## 2017-02-18 NOTE — Progress Notes (Signed)
Results faxed to Dr. Gertie Fey.

## 2017-02-20 ENCOUNTER — Ambulatory Visit: Payer: BLUE CROSS/BLUE SHIELD | Admitting: Family Medicine

## 2017-03-01 ENCOUNTER — Emergency Department (HOSPITAL_BASED_OUTPATIENT_CLINIC_OR_DEPARTMENT_OTHER): Payer: BLUE CROSS/BLUE SHIELD

## 2017-03-01 ENCOUNTER — Inpatient Hospital Stay (HOSPITAL_BASED_OUTPATIENT_CLINIC_OR_DEPARTMENT_OTHER)
Admission: EM | Admit: 2017-03-01 | Discharge: 2017-03-02 | DRG: 603 | Disposition: A | Discharge status: Home / Self Care | Payer: BLUE CROSS/BLUE SHIELD | Attending: Internal Medicine | Admitting: Internal Medicine

## 2017-03-01 ENCOUNTER — Encounter (HOSPITAL_BASED_OUTPATIENT_CLINIC_OR_DEPARTMENT_OTHER): Payer: Self-pay | Admitting: *Deleted

## 2017-03-01 DIAGNOSIS — E2740 Unspecified adrenocortical insufficiency: Secondary | ICD-10-CM | POA: Diagnosis present

## 2017-03-01 DIAGNOSIS — N183 Chronic kidney disease, stage 3 unspecified: Secondary | ICD-10-CM | POA: Diagnosis present

## 2017-03-01 DIAGNOSIS — L03311 Cellulitis of abdominal wall: Secondary | ICD-10-CM | POA: Diagnosis present

## 2017-03-01 DIAGNOSIS — L03116 Cellulitis of left lower limb: Secondary | ICD-10-CM

## 2017-03-01 DIAGNOSIS — Z7682 Awaiting organ transplant status: Secondary | ICD-10-CM | POA: Diagnosis not present

## 2017-03-01 DIAGNOSIS — K7460 Unspecified cirrhosis of liver: Secondary | ICD-10-CM | POA: Diagnosis present

## 2017-03-01 DIAGNOSIS — K7290 Hepatic failure, unspecified without coma: Secondary | ICD-10-CM | POA: Diagnosis present

## 2017-03-01 DIAGNOSIS — Z79899 Other long term (current) drug therapy: Secondary | ICD-10-CM

## 2017-03-01 DIAGNOSIS — D696 Thrombocytopenia, unspecified: Secondary | ICD-10-CM | POA: Diagnosis present

## 2017-03-01 DIAGNOSIS — D631 Anemia in chronic kidney disease: Secondary | ICD-10-CM | POA: Diagnosis present

## 2017-03-01 DIAGNOSIS — Z91018 Allergy to other foods: Secondary | ICD-10-CM

## 2017-03-01 DIAGNOSIS — E876 Hypokalemia: Secondary | ICD-10-CM | POA: Diagnosis present

## 2017-03-01 DIAGNOSIS — E7220 Disorder of urea cycle metabolism, unspecified: Secondary | ICD-10-CM | POA: Diagnosis present

## 2017-03-01 DIAGNOSIS — K766 Portal hypertension: Secondary | ICD-10-CM | POA: Diagnosis present

## 2017-03-01 DIAGNOSIS — Z886 Allergy status to analgesic agent status: Secondary | ICD-10-CM

## 2017-03-01 DIAGNOSIS — K740 Hepatic fibrosis: Secondary | ICD-10-CM | POA: Diagnosis present

## 2017-03-01 DIAGNOSIS — M791 Myalgia: Secondary | ICD-10-CM | POA: Diagnosis present

## 2017-03-01 DIAGNOSIS — K219 Gastro-esophageal reflux disease without esophagitis: Secondary | ICD-10-CM | POA: Diagnosis present

## 2017-03-01 DIAGNOSIS — E739 Lactose intolerance, unspecified: Secondary | ICD-10-CM | POA: Diagnosis present

## 2017-03-01 DIAGNOSIS — D649 Anemia, unspecified: Secondary | ICD-10-CM | POA: Diagnosis not present

## 2017-03-01 DIAGNOSIS — D869 Sarcoidosis, unspecified: Secondary | ICD-10-CM | POA: Diagnosis present

## 2017-03-01 LAB — URINALYSIS, ROUTINE W REFLEX MICROSCOPIC
BILIRUBIN URINE: NEGATIVE
Glucose, UA: 100 mg/dL — AB
HGB URINE DIPSTICK: NEGATIVE
KETONES UR: NEGATIVE mg/dL
Leukocytes, UA: NEGATIVE
Nitrite: NEGATIVE
Protein, ur: NEGATIVE mg/dL
SPECIFIC GRAVITY, URINE: 1.015 (ref 1.005–1.030)
pH: 6 (ref 5.0–8.0)

## 2017-03-01 LAB — COMPREHENSIVE METABOLIC PANEL
ALT: 35 U/L (ref 14–54)
AST: 65 U/L — ABNORMAL HIGH (ref 15–41)
Albumin: 2.4 g/dL — ABNORMAL LOW (ref 3.5–5.0)
Alkaline Phosphatase: 171 U/L — ABNORMAL HIGH (ref 38–126)
Anion gap: 7 (ref 5–15)
BILIRUBIN TOTAL: 3.3 mg/dL — AB (ref 0.3–1.2)
BUN: 15 mg/dL (ref 6–20)
CHLORIDE: 101 mmol/L (ref 101–111)
CO2: 28 mmol/L (ref 22–32)
CREATININE: 1.11 mg/dL — AB (ref 0.44–1.00)
Calcium: 8.2 mg/dL — ABNORMAL LOW (ref 8.9–10.3)
GFR calc Af Amer: >60 mL/min (ref >60)
GFR, EST NON AFRICAN AMERICAN: 54 mL/min — AB (ref >60)
GLUCOSE: 117 mg/dL — AB (ref 65–99)
POTASSIUM: 2.9 mmol/L — AB (ref 3.5–5.1)
SODIUM: 136 mmol/L (ref 135–145)
Total Protein: 4.8 g/dL — ABNORMAL LOW (ref 6.5–8.1)

## 2017-03-01 LAB — CBC WITH DIFFERENTIAL/PLATELET
Basophils Absolute: 0 10*3/uL (ref 0.0–0.1)
Basophils Relative: 0 %
EOS ABS: 0 10*3/uL (ref 0.0–0.7)
EOS PCT: 1 %
HCT: 30.2 % — ABNORMAL LOW (ref 36.0–46.0)
Hemoglobin: 10.5 g/dL — ABNORMAL LOW (ref 12.0–15.0)
LYMPHS ABS: 0.9 10*3/uL (ref 0.7–4.0)
LYMPHS PCT: 18 %
MCH: 34.2 pg — AB (ref 26.0–34.0)
MCHC: 34.8 g/dL (ref 30.0–36.0)
MCV: 98.4 fL (ref 78.0–100.0)
MONO ABS: 0.7 10*3/uL (ref 0.1–1.0)
MONOS PCT: 14 %
Neutro Abs: 3.5 10*3/uL (ref 1.7–7.7)
Neutrophils Relative %: 68 %
PLATELETS: 65 10*3/uL — AB (ref 150–400)
RBC: 3.07 MIL/uL — AB (ref 3.87–5.11)
RDW: 15.3 % (ref 11.5–15.5)
WBC: 5.2 10*3/uL (ref 4.0–10.5)

## 2017-03-01 LAB — I-STAT CG4 LACTIC ACID, ED: LACTIC ACID, VENOUS: 1.84 mmol/L (ref 0.5–1.9)

## 2017-03-01 LAB — SEDIMENTATION RATE: SED RATE: 10 mm/h (ref 0–22)

## 2017-03-01 LAB — AMMONIA: Ammonia: 114 umol/L — ABNORMAL HIGH (ref 9–35)

## 2017-03-01 LAB — PROTIME-INR
INR: 1.24
Prothrombin Time: 15.7 seconds — ABNORMAL HIGH (ref 11.4–15.2)

## 2017-03-01 LAB — LACTIC ACID, PLASMA: LACTIC ACID, VENOUS: 0.9 mmol/L (ref 0.5–1.9)

## 2017-03-01 LAB — C-REACTIVE PROTEIN: CRP: 1.4 mg/dL — ABNORMAL HIGH (ref <1.0)

## 2017-03-01 LAB — BRAIN NATRIURETIC PEPTIDE: B Natriuretic Peptide: 99.4 pg/mL (ref 0.0–100.0)

## 2017-03-01 LAB — PROCALCITONIN: PROCALCITONIN: 0.36 ng/mL

## 2017-03-01 MED ORDER — CEFTRIAXONE SODIUM 1 G IJ SOLR
1.0000 g | Freq: Once | INTRAMUSCULAR | Status: AC
  Administered 2017-03-01: 1 g via INTRAVENOUS
  Filled 2017-03-01: qty 10

## 2017-03-01 MED ORDER — ALBUTEROL SULFATE (2.5 MG/3ML) 0.083% IN NEBU: 2.5000 mg | INHALATION_SOLUTION | Freq: Four times a day (QID) | RESPIRATORY_TRACT | Status: DC | PRN

## 2017-03-01 MED ORDER — ZOLPIDEM TARTRATE 5 MG PO TABS: 5.0000 mg | ORAL_TABLET | Freq: Every evening | ORAL | Status: DC | PRN

## 2017-03-01 MED ORDER — VANCOMYCIN HCL IN DEXTROSE 1-5 GM/200ML-% IV SOLN
1000.0000 mg | Freq: Once | INTRAVENOUS | Status: AC
  Administered 2017-03-01: 1000 mg via INTRAVENOUS
  Filled 2017-03-01: qty 200

## 2017-03-01 MED ORDER — PANTOPRAZOLE SODIUM 40 MG PO TBEC
40.0000 mg | DELAYED_RELEASE_TABLET | Freq: Two times a day (BID) | ORAL | Status: DC
  Administered 2017-03-01 – 2017-03-02 (×2): 40 mg via ORAL
  Filled 2017-03-01 (×2): qty 1

## 2017-03-01 MED ORDER — HYDROCORTISONE NA SUCCINATE PF 100 MG IJ SOLR
50.0000 mg | Freq: Once | INTRAMUSCULAR | Status: AC
  Administered 2017-03-01: 50 mg via INTRAVENOUS
  Filled 2017-03-01: qty 2

## 2017-03-01 MED ORDER — IPRATROPIUM-ALBUTEROL 0.5-2.5 (3) MG/3ML IN SOLN
3.0000 mL | Freq: Four times a day (QID) | RESPIRATORY_TRACT | Status: DC
  Administered 2017-03-02: 3 mL via RESPIRATORY_TRACT
  Filled 2017-03-01: qty 3

## 2017-03-01 MED ORDER — BUDESONIDE 0.25 MG/2ML IN SUSP
0.2500 mg | Freq: Two times a day (BID) | RESPIRATORY_TRACT | Status: DC
  Administered 2017-03-01 – 2017-03-02 (×2): 0.25 mg via RESPIRATORY_TRACT
  Filled 2017-03-01 (×2): qty 2

## 2017-03-01 MED ORDER — POTASSIUM CHLORIDE CRYS ER 20 MEQ PO TBCR
40.0000 meq | EXTENDED_RELEASE_TABLET | Freq: Once | ORAL | Status: AC
  Administered 2017-03-01: 40 meq via ORAL
  Filled 2017-03-01: qty 2

## 2017-03-01 MED ORDER — MOMETASONE FURO-FORMOTEROL FUM 200-5 MCG/ACT IN AERO: 2.0000 | INHALATION_SPRAY | Freq: Two times a day (BID) | RESPIRATORY_TRACT | Status: DC

## 2017-03-01 MED ORDER — PREDNISONE 1 MG PO TABS
1.0000 mg | ORAL_TABLET | Freq: Every day | ORAL | Status: DC
  Filled 2017-03-01: qty 1

## 2017-03-01 MED ORDER — VANCOMYCIN HCL 10 G IV SOLR
1250.0000 mg | INTRAVENOUS | Status: DC
  Administered 2017-03-02: 1250 mg via INTRAVENOUS
  Filled 2017-03-01: qty 1250

## 2017-03-01 MED ORDER — MAGNESIUM SULFATE IN D5W 1-5 GM/100ML-% IV SOLN
1.0000 g | Freq: Once | INTRAVENOUS | Status: AC
  Administered 2017-03-01: 1 g via INTRAVENOUS
  Filled 2017-03-01: qty 100

## 2017-03-01 MED ORDER — SODIUM CHLORIDE 0.9% FLUSH
3.0000 mL | Freq: Two times a day (BID) | INTRAVENOUS | Status: DC
  Administered 2017-03-01 – 2017-03-02 (×2): 3 mL via INTRAVENOUS

## 2017-03-01 MED ORDER — DM-GUAIFENESIN ER 30-600 MG PO TB12: 1.0000 | ORAL_TABLET | Freq: Two times a day (BID) | ORAL | Status: DC | PRN

## 2017-03-01 MED ORDER — IOPAMIDOL (ISOVUE-300) INJECTION 61%
100.0000 mL | Freq: Once | INTRAVENOUS | Status: AC | PRN
  Administered 2017-03-01: 100 mL via INTRAVENOUS

## 2017-03-01 MED ORDER — IBUPROFEN 100 MG/5ML PO SUSP
100.0000 mg | Freq: Three times a day (TID) | ORAL | Status: DC | PRN
  Filled 2017-03-01: qty 5

## 2017-03-01 MED ORDER — ONDANSETRON HCL 4 MG/2ML IJ SOLN: 4.0000 mg | Freq: Three times a day (TID) | INTRAMUSCULAR | Status: DC | PRN

## 2017-03-01 MED ORDER — LACTULOSE 10 GM/15ML PO SOLN
30.0000 g | Freq: Three times a day (TID) | ORAL | Status: DC
  Administered 2017-03-01 – 2017-03-02 (×2): 30 g via ORAL
  Filled 2017-03-01 (×2): qty 45

## 2017-03-01 MED ORDER — DEXTROSE 5 % IV SOLN
1.0000 g | INTRAVENOUS | Status: DC
  Filled 2017-03-01: qty 10

## 2017-03-01 MED ORDER — FAMOTIDINE 20 MG PO TABS
40.0000 mg | ORAL_TABLET | Freq: Every day | ORAL | Status: DC
  Administered 2017-03-01: 40 mg via ORAL
  Filled 2017-03-01: qty 2

## 2017-03-01 MED ORDER — OXYCODONE HCL 5 MG PO TABS: 5.0000 mg | ORAL_TABLET | ORAL | Status: DC | PRN

## 2017-03-01 MED ORDER — IPRATROPIUM-ALBUTEROL 0.5-2.5 (3) MG/3ML IN SOLN
3.0000 mL | Freq: Four times a day (QID) | RESPIRATORY_TRACT | Status: DC
  Administered 2017-03-01: 3 mL via RESPIRATORY_TRACT
  Filled 2017-03-01: qty 3

## 2017-03-01 NOTE — Progress Notes (Signed)
Pharmacy Antibiotic Note  Alicia [name] is a 56 y.o. female admitted on 03/01/2017 with abdominal wall cellulitis.  Pharmacy has been consulted for Vancomycin dosing.  Rocephin per MD.   Patient is afebrile with no leukocytosis.  Renal function at patient's baseline ~50ml/min.   Plan: Rocephin per MD Vancomycin 1250mg  IV every 24 hours.  Goal trough 10-15 mcg/mL.  Check Vancomycin trough at steady state Monitor renal function and cx data   Height: 5\' 3"  (160 cm) Weight: 154 lb 1.6 oz (69.9 kg) IBW/kg (Calculated) : 52.4  Temp (24hrs), Avg:99.3 F (37.4 C), Min:98.7 F (37.1 C), Max:100 F (37.8 C)   Recent Labs Lab 03/01/17 1538 03/01/17 1541  WBC 5.2  --   CREATININE 1.11*  --   LATICACIDVEN  --  1.84    Estimated Creatinine Clearance: 51.8 mL/min (A) (by C-G formula based on SCr of 1.11 mg/dL (H)).    Allergies  Allergen Reactions  . Aspirin Other (See Comments)    Causes Asthma  . Dairy Aid [Lactase]     Causes Asthma attacks  . Eggs Or Egg-Derived Products     Causes Asthma  . Azithromycin Nausea And Vomiting  . Chocolate     Asthma attack, hives  . Gabapentin Other (See Comments)    Blisters in mouth  . Peanut-Containing Drug Products     Asthma, eye swelling, itchy eyes, can't catch her breath    Antimicrobials this admission: 6/2 Vanc >>  6/2 Rocephin >>   Dose adjustments this admission:  Microbiology results:  Thank you for allowing pharmacy to be a part of this patient's care.  Biagio Borg 03/01/2017 8:29 PM

## 2017-03-01 NOTE — ED Notes (Signed)
ED Provider at bedside. 

## 2017-03-01 NOTE — ED Triage Notes (Signed)
Pt reports tearing a L abd muscle over 1 wk ago after coughing. Presents today with abd swelling and is concerned about cellulitis d/t history of same. Pt currently on liver transplant waiting list. Denies fever, n/v/d.

## 2017-03-01 NOTE — ED Notes (Signed)
Patient transported to CT. Vancomycin stopped until CT complete

## 2017-03-01 NOTE — ED Provider Notes (Signed)
[facility] Provider Note   CSN: [id number] Arrival date & time: 03/01/17  1356     History   Chief Complaint Chief Complaint  Patient presents with  . Cellulitis  . Abdominal Pain    HPI Alicia [name] is a very pleasant 56 y.o. female with a past medical history of end-stage liver failure and cirrhosis who is on [facility]. She presents emergency Department with chief complaint of suspected cellulitis of the abdomen. Patient states that last week she coughed and poor and abdominal wall muscles, suffering a large hematoma on the left side of her abdomen and across the front and pubic region. She states that she was sore, but this morning she developed heat, redness, swelling of the abdomen around the umbilicus, which is similar to her previous episodes of cellulitis. She denies any fever or chills. Of note, her husband states that although she is currently at baseline. She has been acting more confused, more frequently lately. He suspects she may have some hyperammonemia. Patient has been taking her lactulose daily. The patient feels that she has some abdominal distention. She has not had any previous or recent paracentesis of her abdomen. She notes some swelling in her lower legs, which is normal for her and didn't. She does not have any excessive swelling today.  HPI  Past Medical History:  Diagnosis Date  . Allergy   . Anemia   . Asthma   . Cellulitis   . Hepatic fibrosis   . Sarcoidosis     Patient Active Problem List   Diagnosis Date Noted  . Cellulitis 06/28/2016  . Thrombocytopenia ([place]) 06/28/2016  . AKI (acute kidney injury) ([place]) 06/28/2016  . Multiple pulmonary nodules determined by computed tomography of lung   . Chronic cough 09/07/2015  . 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 Asthma with chronic obstructive pulmonary disease (COPD) ([place]) 09/04/2011  . 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 GERD (gastroesophageal reflux disease)  07/04/2011  . Sarcoid ([place]) 07/01/2011  . Asthma, severe persistent, with upper airway instability 06/13/2011  . 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 SPINAL FUSION  12/2007   Double spinal Fusion  . VIDEO BRONCHOSCOPY Bilateral 12/08/2015   Procedure: VIDEO BRONCHOSCOPY WITH FLUORO;  Surgeon: Rigoberto Noel, MD;  Location: [facility];  Service: Cardiopulmonary;  Laterality: Bilateral;    OB History    No data available       Home Medications    Prior to Admission medications   Medication Sig Start Date End Date Taking? Authorizing Provider  albuterol (ACCUNEB) 1.25 MG/3ML nebulizer solution Take 1 ampule by nebulization every 6 (six) hours as needed for wheezing.   Yes [provider]  albuterol (PROVENTIL HFA;VENTOLIN HFA) 108 (90 Base) MCG/ACT inhaler Inhale 1-2 puffs into the lungs every 6 (six) hours as needed for wheezing or shortness of breath. 10/19/16  Yes Palumbo, April, MD  DULERA 200-5 MCG/ACT AERO INHALE 2 PUFFS BY MOUTH TWO TIMES DAILY. 02/10/17  Yes Rigoberto Noel, MD  Fluticasone Propionate, Inhal, (FLOVENT DISKUS) 100 MCG/BLIST AEPB Inhale 1 Inhaler into the lungs 2 (two) times daily. 10/19/16  Yes Palumbo, April, MD  furosemide (LASIX) 40 MG tablet Take 120 mg by mouth daily.   Yes [provider]  ipratropium-albuterol (DUONEB) 0.5-2.5 (3) MG/3ML SOLN TAKE 3 MLS BY NEBULIZATION EVERY 4 (FOUR) HOURS AS NEEDED. 11/18/16  Yes Copland, Gay Filler, MD  lactulose ([place]) 10 GM/15ML  solution Take 20 g by mouth 2 (two) times daily. 06/27/16  Yes [provider]  mometasone-formoterol (DULERA) 100-5 MCG/ACT AERO Inhale 2 puffs into the lungs 2 (two) times daily.   Yes [provider]  pantoprazole (PROTONIX) 40 MG tablet Take 1 tablet by mouth 2 (two) times daily.  04/05/11  Yes [provider]  ranitidine (ZANTAC) 300 MG tablet TAKE 1 TABLET AT BEDTIME 12/20/16  Yes Rigoberto Noel, MD  spironolactone (ALDACTONE) 100 MG tablet Take 300 mg by mouth daily.    Yes [provider]  cephALEXin (KEFLEX) 500 MG capsule Take 1 capsule (500 mg total) by mouth 3 (three) times daily. 01/20/17   Copland, Gay Filler, MD    Family History Family History  Problem Relation Age of Onset  . Arthritis Mother   . Heart disease Father        Triple bypass  . Migraines Father   . Heart attack Paternal Grandmother   . Stroke Paternal Grandfather   . Liver disease Sister        Juvenile cysitc liver disease  . Asthma Sister     Social History Social History  Substance Use Topics  . Smoking status: Never Smoker  . Smokeless tobacco: Never Used  . Alcohol use No     Allergies   Aspirin; Dairy aid [lactase]; Eggs or egg-derived products; Azithromycin; Chocolate; Gabapentin; and Peanut-containing drug products   Review of Systems Review of Systems  Ten systems reviewed and are negative for acute change, except as noted in the HPI.   Physical Exam Updated Vital Signs BP 129/71 (BP Location: Left Arm)   Pulse 94   Temp 100 F (37.8 C) (Oral)   Resp 20   Ht 5\' 3"  (1.6 m)   Wt 68 kg (150 lb)   SpO2 96%   BMI 26.57 kg/m   Physical Exam  Constitutional: She is oriented to person, place, and time. She appears well-developed and well-nourished. No distress.  HENT:  Head: Normocephalic and atraumatic.  Eyes: Conjunctivae are normal. Scleral icterus is present.  Neck: Normal range of motion.  Cardiovascular: Normal rate, regular rhythm and normal heart sounds.  Exam reveals no gallop and no friction rub.   No murmur heard. Pulmonary/Chest: Effort normal and breath sounds normal. No respiratory distress.  Abdominal: Soft. Bowel sounds are normal. She exhibits distension. She exhibits no mass. There is tenderness. There is no guarding.  Hematoma along the lower abdomen, and left lateral side of the abdomen. Tender to palpation. There is a well demarcated area of  erythema around the umbilicus which is exquisitely tender to palpation, warm to the touch. Abdomen is otherwise not significantly tender in the regions that are not affected.  Neurological: She is alert and oriented to person, place, and time.  Skin: Skin is warm and dry. She is not diaphoretic.  Multiple skin tears patient's arms and legs are covered in bruises. 3+ pitting edema of the lower extremities from the calves down. This is nontender.  Psychiatric: Her behavior is normal.  Nursing note and vitals reviewed.    ED Treatments / Results  Labs (all labs ordered are listed, but only abnormal results are displayed) Labs Reviewed  URINALYSIS, ROUTINE W REFLEX MICROSCOPIC - Abnormal; Notable for the following:       Result Value   Color, Urine AMBER (*)    APPearance CLOUDY (*)    Glucose, UA 100 (*)    All other components within normal limits  COMPREHENSIVE METABOLIC PANEL  CBC WITH DIFFERENTIAL/PLATELET  AMMONIA  I-STAT CG4 LACTIC ACID, ED    EKG  EKG Interpretation None       Radiology No results found.  Procedures Procedures (including critical care time)  Medications Ordered in ED Medications - No data to display   Initial Impression / Assessment and Plan / ED Course  I have reviewed the triage vital signs and the nursing notes.  Pertinent labs & imaging results that were available during my care of the patient were reviewed by me and considered in my medical decision making (see chart for details).     Patient's CT scan does not show any infected hematoma or tracking into the abdomen. She does have a cellulitis of the abdomen. Patient is also hyperammonemic more than double her normal range of ammonia. She also has elevated bilirubin, which is potentially higher secondary to the large abdominal wall hematoma, however. Patient also has end-stage hepatic failure. Patient is also hypokalemic 2.9, and will require supplementation. I have started the patient on 40  mEq dose of potassium. Given the patient's comorbidities that she will need admission. Patient seen in shared visit with attending physician. Who agrees with assessment, work up , treatment, and plan for admission   Final Clinical Impressions(s) / ED Diagnoses   Final diagnoses:  None    New Prescriptions New Prescriptions   No medications on file     Margarita Mail, PA-C 03/02/17 2320    Charlesetta Shanks, MD 03/07/17 [phone]

## 2017-03-01 NOTE — H&P (Signed)
History and Physical    Alicia [name] NWG:[id number] DOB: 02-12-1961 DOA: 03/01/2017  Referring MD/NP/PA:   PCP: Darreld Mclean, MD   Patient coming from:  The patient is coming from home.  At baseline, pt is independent for most of ADL.  Chief Complaint: Abdominal wall muscle pain  HPI: Alicia [name] is a 56 y.o. 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  Patient states that she pulled abdominal wall muscle 1 week ago. Initially she has tearing pain over left lower quadrant abdominal wall, which is constant, 4 out of 10 in severity, nonradiating. It becomes erythematous locally. Today patient developed abdominal wall muscle pain in lower abdomen. It is locally erythematous and warm. She has fever and chills. She does not have nausea, vomiting, diarrhea, symptoms of UTI. Patient has chronic cough with clear mucus production and mild SOB due to sarcoidosis, but no chest pain. Patient states that she feels little confused, but she is alert and oriented 3 when I saw pt on the floor. She answered all questions appropriately.  ED Course: pt was found to have WBC 5.2, lactate 1.84, INR 1.24, negative urinalysis, stable renal function, potassium 2.9, abnormal liver function with ALP 171, AST 65, ALT 35, total bilirubin 3.3. Ammonia level 114, temperature 100 tachycardia, oxygen saturation 95% on room air. Patient is admitted to telemetry bed as inpatient.  CT abdomen/pelvis showed:  1. No substantial interval change in exam. 2. Cirrhotic changes in the liver with evidence of portal venous hypertension and dramatic perigastric and paraesophageal varices. 3. No focal hyperenhancing liver mass. 4. Gallbladder wall thickening with gallstones. While  this may be related to acute cholecystitis, more likely the gallbladder wall thickening is related to the chronic liver disease. If there is clinical concern for cholecystitis, nuclear scintigraphy would prove helpful to assess for cystic duct obstruction. 5. Small volume ascites in the para colic gutters with small bilateral pleural effusions and dependent atelectasis in the lower obes. 6. Nodularity again noted in the omentum, specially along the hepatic flexure. This is unchanged and may be related to vascularity. 7. Confluent soft tissue attenuation is seen at the emboli kiss with Hazy soft tissue attenuation in the subcutaneous fat of the anterior lower abdominal wall. These changes may be related to infection or inflammation. No evidence for anterior abdominal wall abscess  Review of Systems:   General: has fevers, chills, no changes in body weight, has fatigue HEENT: no blurry vision, hearing changes or sore throat Respiratory: has dyspnea, coughing, no wheezing CV: no chest pain, no palpitations GI: no nausea, vomiting, diarrhea, constipation. Has erythema, warmth and tenderness in left lower quadrant and lower abdominal wall.  GU: no dysuria, burning on urination, increased urinary frequency, hematuria  Ext: has leg edema Neuro: no unilateral weakness, numbness, or tingling, no vision change or hearing loss Skin: no rash, no skin tear. MSK: No muscle spasm, no deformity, no limitation of range of movement in spin Heme: No easy bruising.  Travel history: No recent long distant travel.  Allergy:  Allergies  Allergen Reactions  . Aspirin Other (See Comments)    Causes Asthma  . Dairy Aid [Lactase]     Causes Asthma attacks  . Eggs Or Egg-Derived Products     Causes Asthma  . Azithromycin Nausea And Vomiting  .  Chocolate     Asthma attack, hives  . Gabapentin Other (See Comments)    Blisters in mouth  . Peanut-Containing Drug Products     Asthma, eye swelling, itchy eyes, can't  catch her breath    Past Medical History:  Diagnosis Date  . Allergy   . Anemia   . Asthma   . Cellulitis   . Hepatic fibrosis   . Sarcoidosis     Past Surgical History:  Procedure Laterality Date  . SPINAL FUSION  12/2007   Double spinal Fusion  . VIDEO BRONCHOSCOPY Bilateral 12/08/2015   Procedure: VIDEO BRONCHOSCOPY WITH FLUORO;  Surgeon: Rigoberto Noel, MD;  Location: [facility];  Service: Cardiopulmonary;  Laterality: Bilateral;    Social History:  reports that she has never smoked. She has never used smokeless tobacco. She reports that she does not drink alcohol or use drugs.  Family History:  Family History  Problem Relation Age of Onset  . Arthritis Mother   . Heart disease Father        Triple bypass  . Migraines Father   . Heart attack Paternal Grandmother   . Stroke Paternal Grandfather   . Liver disease Sister        Juvenile cysitc liver disease  . Asthma Sister      Prior to Admission medications   Medication Sig Start Date End Date Taking? Authorizing Provider  albuterol (ACCUNEB) 1.25 MG/3ML nebulizer solution Take 1 ampule by nebulization every 6 (six) hours as needed for wheezing.   Yes [provider]  albuterol (PROVENTIL HFA;VENTOLIN HFA) 108 (90 Base) MCG/ACT inhaler Inhale 1-2 puffs into the lungs every 6 (six) hours as needed for wheezing or shortness of breath. 10/19/16  Yes Palumbo, April, MD  DULERA 200-5 MCG/ACT AERO INHALE 2 PUFFS BY MOUTH TWO TIMES DAILY. 02/10/17  Yes Rigoberto Noel, MD  Fluticasone Propionate, Inhal, (FLOVENT DISKUS) 100 MCG/BLIST AEPB Inhale 1 Inhaler into the lungs 2 (two) times daily. 10/19/16  Yes Palumbo, April, MD  furosemide (LASIX) 40 MG tablet Take 120 mg by mouth daily.   Yes [provider]  ipratropium-albuterol (DUONEB) 0.5-2.5 (3) MG/3ML SOLN TAKE 3 MLS BY NEBULIZATION EVERY 4 (FOUR) HOURS AS NEEDED. 11/18/16  Yes Copland, Gay Filler, MD  lactulose (CHRONULAC) 10 GM/15ML solution Take 20 g by mouth  2 (two) times daily. 06/27/16  Yes [provider]  mometasone-formoterol (DULERA) 100-5 MCG/ACT AERO Inhale 2 puffs into the lungs 2 (two) times daily.   Yes [provider]  pantoprazole (PROTONIX) 40 MG tablet Take 1 tablet by mouth 2 (two) times daily.  04/05/11  Yes [provider]  ranitidine (ZANTAC) 300 MG tablet TAKE 1 TABLET AT BEDTIME 12/20/16  Yes Rigoberto Noel, MD  spironolactone (ALDACTONE) 100 MG tablet Take 300 mg by mouth daily.    Yes [provider]  cephALEXin (KEFLEX) 500 MG capsule Take 1 capsule (500 mg total) by mouth 3 (three) times daily. 01/20/17   CoplandGay Filler, MD    Physical Exam: Vitals:   03/01/17 1800 03/01/17 1805 03/01/17 1807 03/01/17 1908  BP: 109/66 109/66  (!) 126/58  Pulse: 87 86  85  Resp: (!) _0 Temp:   99.4 F (37.4 C) 99.1 F (37.3 C)  TempSrc:   Oral Oral  SpO2: 94% 96%  95%  Weight:    69.9 kg (154 lb 1.6 oz)  Height:       General: Not in acute  distress HEENT:       Eyes: PERRL, EOMI, no scleral icterus.       ENT: No discharge from the ears and nose, no pharynx injection, no tonsillar enlargement.        Neck: No JVD, no bruit, no mass felt. Heme: No neck lymph node enlargement. Cardiac: S1/S2, RRR, No murmurs, No gallops or rubs. Respiratory:  No rales, wheezing, rhonchi or rubs. GI: Soft, nondistended, no rebound pain, no organomegaly, BS present. Has erythema, warmth and tenderness in left lower quadrant and lower abdominal wall.  GU: No hematuria Ext: 1+ pitting leg edema bilaterally. 2+DP/PT pulse bilaterally. Musculoskeletal: No joint deformities, No joint redness or warmth, no limitation of ROM in spin. Skin: No rashes.  Neuro: Alert, oriented X3, cranial nerves II-XII grossly intact, moves all extremities normally.  Psych: Patient is not psychotic, no suicidal or hemocidal ideation.  Labs on Admission: I have personally reviewed following labs and imaging  studies  CBC:  Recent Labs Lab 03/01/17 1538  WBC 5.2  NEUTROABS 3.5  HGB 10.5*  HCT 30.2*  MCV 98.4  PLT 65*   Basic Metabolic Panel:  Recent Labs Lab 03/01/17 1538  NA 136  K 2.9*  CL 101  CO2 28  GLUCOSE 117*  BUN 15  CREATININE 1.11*  CALCIUM 8.2*   GFR: Estimated Creatinine Clearance: 51.8 mL/min (A) (by C-G formula based on SCr of 1.11 mg/dL (H)). Liver Function Tests:  Recent Labs Lab 03/01/17 1538  AST 65*  ALT 35  ALKPHOS 171*  BILITOT 3.3*  PROT 4.8*  ALBUMIN 2.4*   No results for input(s): LIPASE, AMYLASE in the last 168 hours.  Recent Labs Lab 03/01/17 1538  AMMONIA 114*   Coagulation Profile:  Recent Labs Lab 03/01/17 1538  INR 1.24   Cardiac Enzymes: No results for input(s): CKTOTAL, CKMB, CKMBINDEX, TROPONINI in the last 168 hours. BNP (last 3 results) No results for input(s): PROBNP in the last 8760 hours. HbA1C: No results for input(s): HGBA1C in the last 72 hours. CBG: No results for input(s): GLUCAP in the last 168 hours. Lipid Profile: No results for input(s): CHOL, HDL, LDLCALC, TRIG, CHOLHDL, LDLDIRECT in the last 72 hours. Thyroid Function Tests: No results for input(s): TSH, T4TOTAL, FREET4, T3FREE, THYROIDAB in the last 72 hours. Anemia Panel: No results for input(s): VITAMINB12, FOLATE, FERRITIN, TIBC, IRON, RETICCTPCT in the last 72 hours. Urine analysis:    Component Value Date/Time   COLORURINE AMBER (A) 03/01/2017 1414   APPEARANCEUR CLOUDY (A) 03/01/2017 1414   LABSPEC 1.015 03/01/2017 1414   PHURINE 6.0 03/01/2017 1414   GLUCOSEU 100 (A) 03/01/2017 1414   HGBUR NEGATIVE 03/01/2017 1414   BILIRUBINUR NEGATIVE 03/01/2017 1414   KETONESUR NEGATIVE 03/01/2017 1414   PROTEINUR NEGATIVE 03/01/2017 1414   NITRITE NEGATIVE 03/01/2017 1414   LEUKOCYTESUR NEGATIVE 03/01/2017 1414   Sepsis Labs: _0 (procalcitonin:4,lacticidven:4) )No results found for this or any previous visit (from the past 240  hour(s)).   Radiological Exams on Admission: Ct Abdomen Pelvis W Contrast  Result Date: 03/01/2017 CLINICAL DATA:  Periumbilical redness with pain and swelling. EXAM: CT ABDOMEN AND PELVIS WITH CONTRAST TECHNIQUE: Multidetector CT imaging of the abdomen and pelvis was performed using the standard protocol following bolus administration of intravenous contrast. CONTRAST:  177m ISOVUE-300 IOPAMIDOL (ISOVUE-300) INJECTION 61% COMPARISON:  05/29/2016 FINDINGS: Lower chest: Multiple bilateral pulmonary nodules, as seen on prior study, stable left base atelectasis with small left pleural effusion. Hepatobiliary: Nodular liver contour compatible with cirrhosis 5  mm low-density lesions central liver is stable and likely a cyst. Other scattered tiny low-density lesions are too small to characterize. Marked gallbladder wall thickening is associated with calcified gallstones. No intrahepatic or extrahepatic biliary dilation. Pancreas: No focal mass lesion. No dilatation of the main duct. No intraparenchymal cyst. No peripancreatic edema. Spleen: Spleen is 16.7 cm cranial caudal length and enlarged. Adrenals/Urinary Tract: No adrenal nodule or mass. Kidneys unremarkable. No evidence for hydroureter. The urinary bladder appears normal for the degree of distention. Stomach/Bowel: Stomach is nondistended. No gastric wall thickening. No evidence of outlet obstruction. Duodenum is normally positioned as is the ligament of Treitz. No small bowel wall thickening. No small bowel dilatation. Terminal ileum unremarkable. The appendix is normal. No gross colonic mass. No colonic wall thickening. No substantial diverticular change. Vascular/Lymphatic: No abdominal aortic aneurysm. Portal vein is either markedly attenuated were there has been cavernous transformation. Dramatic collateralization identified in the left upper abdomen with perigastric and paraesophageal varices. Nodularity in the omentum is likely vascular. There is no  gastrohepatic or hepatoduodenal ligament lymphadenopathy. No intraperitoneal or retroperitoneal lymphadenopathy. No pelvic sidewall lymphadenopathy. Reproductive: Fibroid change noted in the uterus including a pedunculated fundal fibroid. There is no adnexal mass. Other: Small volume fluid identified in the para colic gutters bilaterally and in the cul-de-sac. Musculoskeletal: Bone windows reveal no worrisome lytic or sclerotic osseous lesions. Hazy soft tissue attenuation identified in the anterior subcutaneous fat of the lower abdominal wall, nonspecific but this may be related to infection or inflammation. There is some associated diffuse appearing body wall edema. IMPRESSION: 1. No substantial interval change in exam. 2. Cirrhotic changes in the liver with evidence of portal venous hypertension and dramatic perigastric and paraesophageal varices. 3. No focal hyperenhancing liver mass. 4. Gallbladder wall thickening with gallstones. While this may be related to acute cholecystitis, more likely the gallbladder wall thickening is related to the chronic liver disease. If there is clinical concern for cholecystitis, nuclear scintigraphy would prove helpful to assess for cystic duct obstruction. 5. Small volume ascites in the para colic gutters with small bilateral pleural effusions and dependent atelectasis in the lower lobes. 6. Nodularity again noted in the omentum, specially along the hepatic flexure. This is unchanged and may be related to vascularity. 7. Confluent soft tissue attenuation is seen at the emboli kiss with Hazy soft tissue attenuation in the subcutaneous fat of the anterior lower abdominal wall. These changes may be related to infection or inflammation. No evidence for anterior abdominal wall abscess Electronically Signed   By: Misty Stanley M.D.   On: 03/01/2017 16:57     EKG: Independently reviewed.  QTC 490, sinus rhythm, nonspecific T-wave change.   Assessment/Plan Principal Problem:    Cellulitis, abdominal wall Active Problems:   Sarcoid (HCC)   Congenital hepatic fibrosis   GERD (gastroesophageal reflux disease)   Thrombocytopenia (HCC)   Normocytic anemia   Hypokalemia   CKD (chronic kidney disease), stage III   Abdominal wall cellulitis   Adrenal insufficiency (HCC)   Hyperammonemia (HCC)   Cellulitis, abdominal wall: CT scan did not show abscess. Patient has fever, but no leukocytosis, lactate is normal. Clinically nonseptic. Currently hemodynamically stable.  - will admit to tele bed as inpt - Empiric antimicrobial treatment with vancomycin and Rocephin - PRN oxycodon for pain - Blood cultures x 2  - ESR and CRP - will get Procalcitonin and trend lactic acid levels - No IVF, but hold diuretics - low dose of ibuprofen when necessary for  fever  Congenital hepatic fibrosis: AST 65, ALT 35, total bilirubin 3.3, ALP 171. Patient states that she is on transplantation list at [facility]. She has hyperammonemia with ammonium 114. Patient is oriented 3 currently, but at risk of developing hepatic encephalopathy. -Increased lactulose from 20 twice a day to 30 g 3 times a day -Hold Lasix and spironolactone due to risk of developing sepsis. -Frequent neuro check -Avoid Tylenol  Hx of Sarcoid ([place]): Calcium 8.2. Patient has chronic cough with clear mucus production and mild SOB due to sarcoidosis, which is close to her baseline.  -Continue Pulmicort nebulizer, when necessary albuterol nebulizer, DuoNeb nebulizer  GERD: -Protonix -Pepcid  Thrombocytopenia ([place]): Related to 65, most likely due to hepatic fibrosis. No active bleeding. -Follow-up by CBC  Normocytic anemia: Hgb 10.5, stable -f/u by CBC  Hypokalemia: K=2.9   on admission. - Repleted K - give 1 g of magnesium sulfate - Check Mg level  CKD (chronic kidney disease), stage III: stable. Baseline creatinine 1.0-1.22. Her creatinine is 1.11 on admission. -Follow-up renal function by  BMP  Hx of Adrenal insufficiency: She is taking 1 mg prednisone daily currently per pt. -will increase prednisone dose to 2.5 mg daily -50 mg Solu Cortef X 1 as stress dose -Check cortisol level   DVT ppx: SCd Code Status: Full code Family Communication: None at bed side.  Disposition Plan:  Anticipate discharge back to previous home environment Consults called:  none Admission status: Inpatient/tele     Date of Service 03/01/2017    Ivor Costa [facility] Pager [phone]  If 7PM-7AM, please contact night-coverage [email or web address] Password [facility] 03/01/2017, 8:34 PM

## 2017-03-02 DIAGNOSIS — L03311 Cellulitis of abdominal wall: Principal | ICD-10-CM

## 2017-03-02 DIAGNOSIS — N183 Chronic kidney disease, stage 3 (moderate): Secondary | ICD-10-CM

## 2017-03-02 DIAGNOSIS — D869 Sarcoidosis, unspecified: Secondary | ICD-10-CM

## 2017-03-02 DIAGNOSIS — E2740 Unspecified adrenocortical insufficiency: Secondary | ICD-10-CM

## 2017-03-02 DIAGNOSIS — K219 Gastro-esophageal reflux disease without esophagitis: Secondary | ICD-10-CM

## 2017-03-02 DIAGNOSIS — D696 Thrombocytopenia, unspecified: Secondary | ICD-10-CM

## 2017-03-02 LAB — COMPREHENSIVE METABOLIC PANEL
ALT: 40 U/L (ref 14–54)
ANION GAP: 6 (ref 5–15)
AST: 58 U/L — ABNORMAL HIGH (ref 15–41)
Albumin: 2.6 g/dL — ABNORMAL LOW (ref 3.5–5.0)
Alkaline Phosphatase: 184 U/L — ABNORMAL HIGH (ref 38–126)
BILIRUBIN TOTAL: 3.2 mg/dL — AB (ref 0.3–1.2)
BUN: 15 mg/dL (ref 6–20)
CO2: 27 mmol/L (ref 22–32)
Calcium: 8.3 mg/dL — ABNORMAL LOW (ref 8.9–10.3)
Chloride: 102 mmol/L (ref 101–111)
Creatinine, Ser: 1.03 mg/dL — ABNORMAL HIGH (ref 0.44–1.00)
GFR calc non Af Amer: 59 mL/min — ABNORMAL LOW (ref >60)
Glucose, Bld: 251 mg/dL — ABNORMAL HIGH (ref 65–99)
POTASSIUM: 3.7 mmol/L (ref 3.5–5.1)
Sodium: 135 mmol/L (ref 135–145)
TOTAL PROTEIN: 5.2 g/dL — AB (ref 6.5–8.1)

## 2017-03-02 LAB — CORTISOL-AM, BLOOD: CORTISOL - AM: 59.3 ug/dL — AB (ref 6.7–22.6)

## 2017-03-02 LAB — CBC
HCT: 30.4 % — ABNORMAL LOW (ref 36.0–46.0)
Hemoglobin: 10.5 g/dL — ABNORMAL LOW (ref 12.0–15.0)
MCH: 33.2 pg (ref 26.0–34.0)
MCHC: 34.5 g/dL (ref 30.0–36.0)
MCV: 96.2 fL (ref 78.0–100.0)
PLATELETS: 57 10*3/uL — AB (ref 150–400)
RBC: 3.16 MIL/uL — AB (ref 3.87–5.11)
RDW: 15.5 % (ref 11.5–15.5)
WBC: 5.2 10*3/uL (ref 4.0–10.5)

## 2017-03-02 LAB — PROTIME-INR
INR: 1.26
PROTHROMBIN TIME: 15.9 s — AB (ref 11.4–15.2)

## 2017-03-02 LAB — HIV ANTIBODY (ROUTINE TESTING W REFLEX): HIV SCREEN 4TH GENERATION: NONREACTIVE

## 2017-03-02 LAB — MAGNESIUM: Magnesium: 2.2 mg/dL (ref 1.7–2.4)

## 2017-03-02 LAB — LACTIC ACID, PLASMA: LACTIC ACID, VENOUS: 1.4 mmol/L (ref 0.5–1.9)

## 2017-03-02 MED ORDER — TRAMADOL HCL 50 MG PO TABS
50.0000 mg | ORAL_TABLET | Freq: Once | ORAL | Status: AC
  Administered 2017-03-02: 50 mg via ORAL
  Filled 2017-03-02: qty 1

## 2017-03-02 MED ORDER — CEPHALEXIN 500 MG PO CAPS: 500.0000 mg | ORAL_CAPSULE | Freq: Four times a day (QID) | ORAL | 0 refills | Status: DC

## 2017-03-02 MED ORDER — PREDNISONE 5 MG PO TABS
2.5000 mg | ORAL_TABLET | Freq: Every day | ORAL | Status: DC
  Administered 2017-03-02: 2.5 mg via ORAL
  Filled 2017-03-02: qty 1

## 2017-03-02 MED ORDER — IPRATROPIUM-ALBUTEROL 0.5-2.5 (3) MG/3ML IN SOLN: 3.0000 mL | Freq: Two times a day (BID) | RESPIRATORY_TRACT | Status: DC

## 2017-03-02 NOTE — Discharge Summary (Signed)
Physician Discharge Summary  Amire Gossen DTH:[id number] DOB: Mar 19, 1961 DOA: 03/01/2017  PCP: Darreld Mclean, MD  Admit date: 03/01/2017 Discharge date: 03/02/2017  Time spent: 35 minutes  Recommendations for Outpatient Follow-up:  1. Repeat CMET to follow electrolytes, renal function and LFTs. 2. Repeat CBC m to follow hemoglobin and platelets trend. 3. Reassess skin abdominal wall to assure complete resolution of acute cellulitis.   Discharge Diagnoses:  Principal Problem:   Cellulitis, abdominal wall Active Problems:   Sarcoid (HCC)   Congenital hepatic fibrosis   GERD (gastroesophageal reflux disease)   Thrombocytopenia (HCC)   Normocytic anemia   Hypokalemia   CKD (chronic kidney disease), stage III   Abdominal wall cellulitis   Adrenal insufficiency (HCC)   Hyperammonemia (HCC)   Discharge Condition: stable and improved. No nausea, no vomiting. Denies CP and SOB. Advised to Follow-up with PCP in 10 days.   Diet recommendation: low sodium diet, to help minimizing ascites   Filed Weights   03/01/17 1402 03/01/17 1908  Weight: 68 kg (150 lb) 69.9 kg (154 lb 1.6 oz)    History of present illness:  As per H&P written by Dr. Blaine Hamper on 03/02/17 56 y.o. 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 Patient states that she pulled abdominal wall muscle 1 week ago. Initially she has tearing pain over left lower quadrant abdominal wall, which is constant, 4 out of 10 in severity, nonradiating. It becomes erythematous locally. Today patient developed abdominal wall muscle pain in lower abdomen. It is locally erythematous and warm. She has fever and chills. She does not have nausea, vomiting, diarrhea, symptoms of UTI.   Hospital Course:   1-abdominal wall cellulitis: CT scan demonstrated no abscess, patient now afebrile over 24 hours and without any leukocytosis. -Normal lactic acid and nonseptic. -Patient denies any nausea, vomiting, or inability taken by mouth. -After significant improvement with IV antibiotics and further discussion with infectious disease; plan is to discharge patient home with oral Keflex 4 times a day for the next 9 days to complete antibiotic therapy. -Patient has been advised to keep herself well-hydrated, and to use cold compresses and ibuprofen as needed for inflammation control and analgesia.  2-congenital hepatic fibrosis and cirrhosis  -Appears to be stable. -Patient will continue home medication regimen and will resume her home diuretics. -Continue follow-up at [facility] as she is in the process of being Placed on transportation list. -low sodium diet recommended   3-history of sarcoid and atrial insufficiency -Mild elevation in her cortisol level -Continue home steroid dosage -Continue home nebulizer therapy.  4-GERD Will continue Protonix and Pepcid  5-normocytic anemia and trombocytopenia -will recommend CBC to monitor Hgb and platelets level -no signs of acute bleeding   6-hypokalemia -Electrolytes repleted -Patient will continue the use of multivitamins and potassium supplementation.  7-chronic kidney disease stage III -Stable and at baseline. -Will recommend repeat basic metabolic panel to follow renal function trend.   8-hyperammonemia -Continue lactulose. -No confusion or encephalopathic changes.   Procedures:  See below for x-ray reports   Consultations:  ID (Dr. Graylon Good Curbside)  Discharge Exam: Vitals:   03/02/17 0519 03/02/17 1332  BP: 101/64 108/67  Pulse: 80 85  Resp: 18 18  Temp: 98.5 F (36.9 C) 99 F (37.2 C)    General: afebrile, no CP, no  SOB. With significant improvement in her abdominal wall cellulitis. No nausea, no vomiting. Cardiovascular: S1  and S2, no rubs, no gallops, no JVD Respiratory: CTA bilaterally  Abd: soft, positive BS, no guarding. Patient with erythematous changes under umbilical area, improving and no warm to touch currently. Extremities: 1++ edema bilaterally  Discharge Instructions   Discharge Instructions    Diet - low sodium heart healthy    Complete by:  As directed    Discharge instructions    Complete by:  As directed    Take medications as prescribed Use cold compresses and take analgesics as needed  Please follow up with PCP in 10 days  Maintain adequate hydration Keep skin clean and dry     Current Discharge Medication List    CONTINUE these medications which have CHANGED   Details  cephALEXin (KEFLEX) 500 MG capsule Take 1 capsule (500 mg total) by mouth 4 (four) times daily. Qty: 36 capsule, Refills: 0   Associated Diagnoses: Cellulitis of left anterior lower leg      CONTINUE these medications which have NOT CHANGED   Details  albuterol (PROVENTIL HFA;VENTOLIN HFA) 108 (90 Base) MCG/ACT inhaler Inhale 1-2 puffs into the lungs every 6 (six) hours as needed for wheezing or shortness of breath. Qty: 1 Inhaler, Refills: 0    benzonatate (TESSALON) 100 MG capsule Take 100 mg by mouth 3 (three) times daily as needed. Refills: 2    DULERA 200-5 MCG/ACT AERO INHALE 2 PUFFS BY MOUTH TWO TIMES DAILY. Qty: 13 Inhaler, Refills: 0    furosemide (LASIX) 40 MG tablet Take 120 mg by mouth daily. Monday and Thursday pt will take another 120mg  at night    ibuprofen (ADVIL,MOTRIN) 200 MG tablet Take 400 mg by mouth every 6 (six) hours as needed for headache, mild pain or moderate pain.    ipratropium-albuterol (DUONEB) 0.5-2.5 (3) MG/3ML SOLN TAKE 3 MLS BY NEBULIZATION EVERY 4 (FOUR) HOURS AS NEEDED. Qty: 360 mL, Refills: 0   Associated Diagnoses: Shortness of breath    lactulose (CHRONULAC) 10 GM/15ML solution Take 20 g by mouth 2 (two) times daily.    pantoprazole (PROTONIX) 40 MG tablet Take 1  tablet by mouth 2 (two) times daily.     ranitidine (ZANTAC) 300 MG tablet TAKE 1 TABLET AT BEDTIME Qty: 90 tablet, Refills: 1    SPIRIVA RESPIMAT 2.5 MCG/ACT AERS Inhale 2 puffs into the lungs daily. Refills: 11    spironolactone (ALDACTONE) 100 MG tablet Take 300 mg by mouth daily.     Fluticasone Propionate, Inhal, (FLOVENT DISKUS) 100 MCG/BLIST AEPB Inhale 1 Inhaler into the lungs 2 (two) times daily. Qty: 1 each, Refills: 0      STOP taking these medications     albuterol (ACCUNEB) 1.25 MG/3ML nebulizer solution        Allergies  Allergen Reactions  . Aspirin Other (See Comments)    Causes Asthma  . Azithromycin Nausea And Vomiting  . Gabapentin Other (See Comments)    Blisters in mouth, nausea   . Peanut-Containing Drug Products     Asthma, eye swelling, itchy eyes, can't catch her breath   Follow-up Information    Copland, Gay Filler, MD. Schedule an appointment as soon as possible for a visit in 10 day(s).   Specialty:  Family Medicine Contact information: [place] [address] [phone]           The results of significant diagnostics from this hospitalization (including imaging,  microbiology, ancillary and laboratory) are listed below for reference.    Significant Diagnostic Studies: Ct Abdomen Pelvis W Contrast  Result Date: 03/01/2017 CLINICAL DATA:  Periumbilical redness with pain and swelling. EXAM: CT ABDOMEN AND PELVIS WITH CONTRAST TECHNIQUE: Multidetector CT imaging of the abdomen and pelvis was performed using the standard protocol following bolus administration of intravenous contrast. CONTRAST:  150mL ISOVUE-300 IOPAMIDOL (ISOVUE-300) INJECTION 61% COMPARISON:  05/29/2016 FINDINGS: Lower chest: Multiple bilateral pulmonary nodules, as seen on prior study, stable left base atelectasis with small left pleural effusion. Hepatobiliary: Nodular liver contour compatible with cirrhosis 5 mm low-density lesions central liver  is stable and likely a cyst. Other scattered tiny low-density lesions are too small to characterize. Marked gallbladder wall thickening is associated with calcified gallstones. No intrahepatic or extrahepatic biliary dilation. Pancreas: No focal mass lesion. No dilatation of the main duct. No intraparenchymal cyst. No peripancreatic edema. Spleen: Spleen is 16.7 cm cranial caudal length and enlarged. Adrenals/Urinary Tract: No adrenal nodule or mass. Kidneys unremarkable. No evidence for hydroureter. The urinary bladder appears normal for the degree of distention. Stomach/Bowel: Stomach is nondistended. No gastric wall thickening. No evidence of outlet obstruction. Duodenum is normally positioned as is the ligament of Treitz. No small bowel wall thickening. No small bowel dilatation. Terminal ileum unremarkable. The appendix is normal. No gross colonic mass. No colonic wall thickening. No substantial diverticular change. Vascular/Lymphatic: No abdominal aortic aneurysm. Portal vein is either markedly attenuated were there has been cavernous transformation. Dramatic collateralization identified in the left upper abdomen with perigastric and paraesophageal varices. Nodularity in the omentum is likely vascular. There is no gastrohepatic or hepatoduodenal ligament lymphadenopathy. No intraperitoneal or retroperitoneal lymphadenopathy. No pelvic sidewall lymphadenopathy. Reproductive: Fibroid change noted in the uterus including a pedunculated fundal fibroid. There is no adnexal mass. Other: Small volume fluid identified in the para colic gutters bilaterally and in the cul-de-sac. Musculoskeletal: Bone windows reveal no worrisome lytic or sclerotic osseous lesions. Hazy soft tissue attenuation identified in the anterior subcutaneous fat of the lower abdominal wall, nonspecific but this may be related to infection or inflammation. There is some associated diffuse appearing body wall edema. IMPRESSION: 1. No substantial  interval change in exam. 2. Cirrhotic changes in the liver with evidence of portal venous hypertension and dramatic perigastric and paraesophageal varices. 3. No focal hyperenhancing liver mass. 4. Gallbladder wall thickening with gallstones. While this may be related to acute cholecystitis, more likely the gallbladder wall thickening is related to the chronic liver disease. If there is clinical concern for cholecystitis, nuclear scintigraphy would prove helpful to assess for cystic duct obstruction. 5. Small volume ascites in the para colic gutters with small bilateral pleural effusions and dependent atelectasis in the lower lobes. 6. Nodularity again noted in the omentum, specially along the hepatic flexure. This is unchanged and may be related to vascularity. 7. Confluent soft tissue attenuation is seen at the emboli kiss with Hazy soft tissue attenuation in the subcutaneous fat of the anterior lower abdominal wall. These changes may be related to infection or inflammation. No evidence for anterior abdominal wall abscess Electronically Signed   By: Misty Stanley M.D.   On: 03/01/2017 16:57    Microbiology: No results found for this or any previous visit (from the past 240 hour(s)).   Labs: Basic Metabolic Panel:  Recent Labs Lab 03/01/17 1538 03/02/17 0016  NA 136 135  K 2.9* 3.7  CL 101 102  CO2 28 27  GLUCOSE 117* 251*  BUN  15 15  CREATININE 1.11* 1.03*  CALCIUM 8.2* 8.3*  MG  --  2.2   Liver Function Tests:  Recent Labs Lab 03/01/17 1538 03/02/17 0016  AST 65* 58*  ALT 35 40  ALKPHOS 171* 184*  BILITOT 3.3* 3.2*  PROT 4.8* 5.2*  ALBUMIN 2.4* 2.6*    Recent Labs Lab 03/01/17 1538  AMMONIA 114*   CBC:  Recent Labs Lab 03/01/17 1538 03/02/17 0016  WBC 5.2 5.2  NEUTROABS 3.5  --   HGB 10.5* 10.5*  HCT 30.2* 30.4*  MCV 98.4 96.2  PLT 65* 57*   BNP (last 3 results)  Recent Labs  05/29/16 1735 03/01/17 2107  BNP 28.9 99.4    Signed:  Barton Dubois MD.   [facility] 03/02/2017, 2:17 PM

## 2017-03-02 NOTE — Progress Notes (Signed)
Discussed with patient and spouse discharge instructions, both verbalized agreement and understanding.  Patient's to go home in private vehicle with all belongings.

## 2017-03-03 ENCOUNTER — Telehealth: Payer: Self-pay | Admitting: Behavioral Health

## 2017-03-03 NOTE — Telephone Encounter (Signed)
Appointment scheduled for hospital follow-up on 03/05/17 at 11:30 AM with Dr. Lorelei Pont.

## 2017-03-04 NOTE — Progress Notes (Deleted)
[facility] [address] [place], [place] [address] [phone]  Date:  03/05/2017   Name:  Alicia [name]   DOB:  1961/01/26   MRN:  [id number]  PCP:  Darreld Mclean, MD    Chief Complaint: No chief complaint on file.   History of Present Illness:  Alicia [name] is a 56 y.o. very pleasant female patient who presents with the following:  Here today for a hospital follow-up visit Recently admitted overnight as follows:  Admit date: 03/01/2017 Discharge date: 03/02/2017  Time spent: 35 minutes  Recommendations for Outpatient Follow-up:  1. Repeat CMET to follow electrolytes, renal function and LFTs. 2. Repeat CBC m to follow hemoglobin and platelets trend. 3. Reassess skin abdominal wall to assure complete resolution of acute cellulitis.   Discharge Diagnoses:  Principal Problem:   Cellulitis, abdominal wall Active Problems:   Sarcoid (HCC)   Congenital hepatic fibrosis   GERD (gastroesophageal reflux disease)   Thrombocytopenia (HCC)   Normocytic anemia   Hypokalemia   CKD (chronic kidney disease), stage III   Abdominal wall cellulitis   Adrenal insufficiency (HCC)   Hyperammonemia ([place])   Patient Active Problem List   Diagnosis Date Noted  . Cellulitis, abdominal wall 03/01/2017  . Normocytic anemia 03/01/2017  . Hypokalemia 03/01/2017  . CKD (chronic kidney disease), stage III 03/01/2017  . Abdominal wall cellulitis 03/01/2017  . Adrenal insufficiency ([place]) 03/01/2017  . Hyperammonemia ([place])   . Cellulitis 06/28/2016  . Thrombocytopenia ([place]) 06/28/2016  . AKI (acute kidney injury) ([place]) 06/28/2016  . Multiple pulmonary nodules determined by computed tomography of lung   . Chronic cough 09/07/2015  . Hypertension, portal ([place]) 09/04/2011  . Asthma with chronic obstructive pulmonary disease (COPD) ([place]) 09/04/2011  . 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 GERD (gastroesophageal reflux  disease) 07/04/2011  . Sarcoid ([place]) 07/01/2011  . Asthma, severe persistent, with upper airway instability 06/13/2011  . 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 Allergy   . Anemia   . Asthma   . Cellulitis   . Hepatic fibrosis   . Sarcoidosis     Past Surgical History:  Procedure Laterality Date  . SPINAL FUSION  12/2007   Double spinal Fusion  . VIDEO BRONCHOSCOPY Bilateral 12/08/2015   Procedure: VIDEO BRONCHOSCOPY WITH FLUORO;  Surgeon: Rigoberto Noel, MD;  Location: [facility];  Service: Cardiopulmonary;  Laterality: Bilateral;    Social History  Substance Use Topics  . Smoking status: Never Smoker  . Smokeless tobacco: Never Used  . Alcohol use No    Family History  Problem Relation Age of Onset  . Arthritis Mother   . Heart disease Father        Triple bypass  . Migraines Father   . Heart attack Paternal Grandmother   . Stroke Paternal Grandfather   . Liver disease Sister        Juvenile cysitc liver disease  . Asthma Sister     Allergies  Allergen Reactions  . Aspirin Other (See Comments)    Causes Asthma  . Azithromycin Nausea And Vomiting  . Gabapentin Other (See Comments)    Blisters in mouth, nausea   . Peanut-Containing Drug Products     Asthma, eye swelling, itchy eyes, can't catch her breath    Medication list has been reviewed and updated.  Current Outpatient Prescriptions on File Prior to Visit  Medication  Sig Dispense Refill  . albuterol (PROVENTIL HFA;VENTOLIN HFA) 108 (90 Base) MCG/ACT inhaler Inhale 1-2 puffs into the lungs every 6 (six) hours as needed for wheezing or shortness of breath. 1 Inhaler 0  . benzonatate (TESSALON) 100 MG capsule Take 100 mg by mouth 3 (three) times daily as needed.  2  . cephALEXin (KEFLEX) 500 MG capsule Take 1 capsule (500 mg total) by mouth 4 (four) times daily. 36 capsule 0  . DULERA 200-5 MCG/ACT AERO INHALE 2 PUFFS BY MOUTH TWO TIMES DAILY. 13 Inhaler 0  .  Fluticasone Propionate, Inhal, (FLOVENT DISKUS) 100 MCG/BLIST AEPB Inhale 1 Inhaler into the lungs 2 (two) times daily. (Patient not taking: Reported on 03/01/2017) 1 each 0  . furosemide (LASIX) 40 MG tablet Take 120 mg by mouth daily. Monday and Thursday pt will take another 120mg  at night    . ibuprofen (ADVIL,MOTRIN) 200 MG tablet Take 400 mg by mouth every 6 (six) hours as needed for headache, mild pain or moderate pain.    Marland Kitchen ipratropium-albuterol (DUONEB) 0.5-2.5 (3) MG/3ML SOLN TAKE 3 MLS BY NEBULIZATION EVERY 4 (FOUR) HOURS AS NEEDED. 360 mL 0  . lactulose (CHRONULAC) 10 GM/15ML solution Take 20 g by mouth 2 (two) times daily.    . pantoprazole (PROTONIX) 40 MG tablet Take 1 tablet by mouth 2 (two) times daily.     . ranitidine (ZANTAC) 300 MG tablet TAKE 1 TABLET AT BEDTIME 90 tablet 1  . SPIRIVA RESPIMAT 2.5 MCG/ACT AERS Inhale 2 puffs into the lungs daily.  11  . spironolactone (ALDACTONE) 100 MG tablet Take 300 mg by mouth daily.      No current facility-administered medications on file prior to visit.     Review of Systems:  ***  Physical Examination: There were no vitals filed for this visit. There were no vitals filed for this visit. There is no height or weight on file to calculate BMI. Ideal Body Weight:    ***  Assessment and Plan: ***  Signed Lamar Blinks, MD

## 2017-03-05 ENCOUNTER — Other Ambulatory Visit: Payer: BLUE CROSS/BLUE SHIELD

## 2017-03-05 ENCOUNTER — Ambulatory Visit (HOSPITAL_BASED_OUTPATIENT_CLINIC_OR_DEPARTMENT_OTHER)
Admission: RE | Admit: 2017-03-05 | Discharge: 2017-03-05 | Disposition: A | Discharge status: Home / Self Care | Payer: BLUE CROSS/BLUE SHIELD | Source: Ambulatory Visit | Attending: Family Medicine | Admitting: Family Medicine

## 2017-03-05 ENCOUNTER — Telehealth: Payer: Self-pay | Admitting: *Deleted

## 2017-03-05 ENCOUNTER — Other Ambulatory Visit: Payer: Self-pay | Admitting: Family Medicine

## 2017-03-05 ENCOUNTER — Ambulatory Visit (INDEPENDENT_AMBULATORY_CARE_PROVIDER_SITE_OTHER): Payer: BLUE CROSS/BLUE SHIELD | Admitting: Family Medicine

## 2017-03-05 VITALS — BP 116/82 | HR 86 | Temp 98.7°F | Ht 63.0 in | Wt 154.6 lb

## 2017-03-05 DIAGNOSIS — Z09 Encounter for follow-up examination after completed treatment for conditions other than malignant neoplasm: Secondary | ICD-10-CM

## 2017-03-05 DIAGNOSIS — R059 Cough, unspecified: Secondary | ICD-10-CM

## 2017-03-05 DIAGNOSIS — R918 Other nonspecific abnormal finding of lung field: Secondary | ICD-10-CM | POA: Diagnosis not present

## 2017-03-05 DIAGNOSIS — R05 Cough: Secondary | ICD-10-CM

## 2017-03-05 DIAGNOSIS — Z1231 Encounter for screening mammogram for malignant neoplasm of breast: Secondary | ICD-10-CM

## 2017-03-05 DIAGNOSIS — E876 Hypokalemia: Secondary | ICD-10-CM | POA: Diagnosis not present

## 2017-03-05 DIAGNOSIS — J9811 Atelectasis: Secondary | ICD-10-CM | POA: Insufficient documentation

## 2017-03-05 DIAGNOSIS — R109 Unspecified abdominal pain: Secondary | ICD-10-CM

## 2017-03-05 DIAGNOSIS — Z5181 Encounter for therapeutic drug level monitoring: Secondary | ICD-10-CM | POA: Diagnosis not present

## 2017-03-05 LAB — COMPREHENSIVE METABOLIC PANEL
ALT: 42 U/L — ABNORMAL HIGH (ref 0–35)
AST: 55 U/L — AB (ref 0–37)
Albumin: 2.8 g/dL — ABNORMAL LOW (ref 3.5–5.2)
Alkaline Phosphatase: 181 U/L — ABNORMAL HIGH (ref 39–117)
BUN: 12 mg/dL (ref 6–23)
CHLORIDE: 101 meq/L (ref 96–112)
CO2: 28 mEq/L (ref 19–32)
CREATININE: 1.02 mg/dL (ref 0.40–1.20)
Calcium: 8.5 mg/dL (ref 8.4–10.5)
GFR: 59.1 mL/min — ABNORMAL LOW (ref >60.00)
GLUCOSE: 168 mg/dL — AB (ref 70–99)
POTASSIUM: 2.8 meq/L — AB (ref 3.5–5.1)
SODIUM: 137 meq/L (ref 135–145)
TOTAL PROTEIN: 5.3 g/dL — AB (ref 6.0–8.3)
Total Bilirubin: 2.6 mg/dL — ABNORMAL HIGH (ref 0.2–1.2)

## 2017-03-05 LAB — CBC
HEMATOCRIT: 31.3 % — AB (ref 36.0–46.0)
Hemoglobin: 11 g/dL — ABNORMAL LOW (ref 12.0–15.0)
MCHC: 35 g/dL (ref 30.0–36.0)
MCV: 98.5 fl (ref 78.0–100.0)
Platelets: 73 10*3/uL — ABNORMAL LOW (ref 150.0–400.0)
RBC: 3.18 Mil/uL — ABNORMAL LOW (ref 3.87–5.11)
RDW: 16.2 % — AB (ref 11.5–15.5)
WBC: 4.7 10*3/uL (ref 4.0–10.5)

## 2017-03-05 MED ORDER — TRAMADOL HCL 50 MG PO TABS: 50.0000 mg | ORAL_TABLET | Freq: Two times a day (BID) | ORAL | 0 refills | Status: DC | PRN

## 2017-03-05 MED ORDER — POTASSIUM CHLORIDE CRYS ER 20 MEQ PO TBCR: EXTENDED_RELEASE_TABLET | ORAL | 3 refills | Status: DC

## 2017-03-05 NOTE — Progress Notes (Signed)
[facility] [address] [place], [place] [address] [phone]  Date:  03/05/2017   Name:  Alicia [name]   DOB:  Aug 19, 1961   MRN:  [id number]  PCP:  Darreld Mclean, MD    Chief Complaint: Hospitalization Follow-up (Pt admitted to hospital over the weekend and is here to f/u. )   History of Present Illness:  Alicia [name] is a 56 y.o. very pleasant female patient who presents with the following:  Here today for a hospital follow-up visit Recently admitted overnight with abdominal wall cellulitis, partial DC summary as follows:  Admit date: 03/01/2017 Discharge date: 03/02/2017  Recommendations for Outpatient Follow-up:  1. Repeat CMET to follow electrolytes, renal function and LFTs. 2. Repeat CBC m to follow hemoglobin and platelets trend. 3. Reassess skin abdominal wall to assure complete resolution of acute cellulitis.  Discharge Diagnoses:  Principal Problem:   Cellulitis, abdominal wall Active Problems:   Sarcoid (HCC)   Congenital hepatic fibrosis   GERD (gastroesophageal reflux disease)   Thrombocytopenia (HCC)   Normocytic anemia   Hypokalemia   CKD (chronic kidney disease), stage III   Abdominal wall cellulitis   Adrenal insufficiency (HCC)   Hyperammonemia (HCC)  Notes that over the course of a few hours the skin on her belly became red and sore.  Having been through this before she recognized the signs of cellulitis and went to the ER.  She was admitted overnight, is now taking keflex.  Her cellulitis is much better and seems to be under control.   She has been traveling to [facility] for eval for liver transplant.  She is seeing pulmonology tomorrow as part of this evaluation  She is having some cardiac effects from her portal hypertension- apparently her heart is shrinking in size.    Last week she coughed a lot and thinks that she developed a torn muscle in her side- she had some bruising and  tenderness over her left side She did see another provider as we were full- he agreed with her assessment of a torn muscle and she is treating this conservatively.  However it is still quite painful and can make it hard for her to take a deep breath.   She is on keflex for her cellulitis which tends to dry her mouth out and make her cough. Again, coughing is hard due to her side pain.  She will be on this for one week No fever Her energy level is up and down She is trying to keep a positive spirit but is worried about her health.  She hopes that she can get this liver transplant Patient Active Problem List   Diagnosis Date Noted  . Cellulitis, abdominal wall 03/01/2017  . Normocytic anemia 03/01/2017  . Hypokalemia 03/01/2017  . CKD (chronic kidney disease), stage III 03/01/2017  . Abdominal wall cellulitis 03/01/2017  . Adrenal insufficiency ([place]) 03/01/2017  . Hyperammonemia ([place])   . Cellulitis 06/28/2016  . Thrombocytopenia ([place]) 06/28/2016  . AKI (acute kidney injury) ([place]) 06/28/2016  . Multiple pulmonary nodules determined by computed tomography of lung   . Chronic cough 09/07/2015  . Hypertension, portal ([place]) 09/04/2011  . Asthma with chronic obstructive pulmonary disease (COPD) ([place]) 09/04/2011  . 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 GERD (gastroesophageal reflux disease) 07/04/2011  . Sarcoid ([place]) 07/01/2011  . Asthma, severe persistent, with upper airway instability 06/13/2011  . Congenital hepatic fibrosis 07/03/2000    Past  Medical History:  Diagnosis Date  . Allergy   . Anemia   . Asthma   . Cellulitis   . Hepatic fibrosis   . Sarcoidosis     Past Surgical History:  Procedure Laterality Date  . SPINAL FUSION  12/2007   Double spinal Fusion  . VIDEO BRONCHOSCOPY Bilateral 12/08/2015   Procedure: VIDEO BRONCHOSCOPY WITH FLUORO;  Surgeon: Rigoberto Noel, MD;  Location: [place];  Service: Cardiopulmonary;  Laterality: Bilateral;    Social History  Substance  Use Topics  . Smoking status: Never Smoker  . Smokeless tobacco: Never Used  . Alcohol use No    Family History  Problem Relation Age of Onset  . Arthritis Mother   . Heart disease Father        Triple bypass  . Migraines Father   . Heart attack Paternal Grandmother   . Stroke Paternal Grandfather   . Liver disease Sister        Juvenile cysitc liver disease  . Asthma Sister     Allergies  Allergen Reactions  . Aspirin Other (See Comments)    Causes Asthma  . Azithromycin Nausea And Vomiting  . Gabapentin Other (See Comments)    Blisters in mouth, nausea   . Peanut-Containing Drug Products     Asthma, eye swelling, itchy eyes, can't catch her breath    Medication list has been reviewed and updated.  Current Outpatient Prescriptions on File Prior to Visit  Medication Sig Dispense Refill  . albuterol (PROVENTIL HFA;VENTOLIN HFA) 108 (90 Base) MCG/ACT inhaler Inhale 1-2 puffs into the lungs every 6 (six) hours as needed for wheezing or shortness of breath. 1 Inhaler 0  . benzonatate (TESSALON) 100 MG capsule Take 100 mg by mouth 3 (three) times daily as needed.  2  . cephALEXin (KEFLEX) 500 MG capsule Take 1 capsule (500 mg total) by mouth 4 (four) times daily. 36 capsule 0  . DULERA 200-5 MCG/ACT AERO INHALE 2 PUFFS BY MOUTH TWO TIMES DAILY. 13 Inhaler 0  . Fluticasone Propionate, Inhal, (FLOVENT DISKUS) 100 MCG/BLIST AEPB Inhale 1 Inhaler into the lungs 2 (two) times daily. 1 each 0  . furosemide (LASIX) 40 MG tablet Take 120 mg by mouth daily. Monday and Thursday pt will take another 120mg  at night    . ibuprofen (ADVIL,MOTRIN) 200 MG tablet Take 400 mg by mouth every 6 (six) hours as needed for headache, mild pain or moderate pain.    Marland Kitchen ipratropium-albuterol (DUONEB) 0.5-2.5 (3) MG/3ML SOLN TAKE 3 MLS BY NEBULIZATION EVERY 4 (FOUR) HOURS AS NEEDED. 360 mL 0  . lactulose (CHRONULAC) 10 GM/15ML solution Take 20 g by mouth 2 (two) times daily.    . pantoprazole (PROTONIX) 40  MG tablet Take 1 tablet by mouth 2 (two) times daily.     . ranitidine (ZANTAC) 300 MG tablet TAKE 1 TABLET AT BEDTIME 90 tablet 1  . SPIRIVA RESPIMAT 2.5 MCG/ACT AERS Inhale 2 puffs into the lungs daily.  11  . spironolactone (ALDACTONE) 100 MG tablet Take 300 mg by mouth daily.      No current facility-administered medications on file prior to visit.     Review of Systems:  As per HPI- otherwise negative. Eating ok Some swelling of her lower legs- one spot on her right leg is seeping clear fluid   Physical Examination: Vitals:   03/05/17 1135  BP: 116/82  Pulse: 86  Temp: 98.7 F (37.1 C)   Vitals:   03/05/17 1135  Weight: 154 lb 9.6 oz (70.1 kg)  Height: 5\' 3"  (1.6 m)   Body mass index is 27.39 kg/m. Ideal Body Weight: Weight in (lb) to have BMI = 25: 140.8  GEN: WDWN, NAD, Non-toxic, A & O x 3, looks her normal self, in good spirits.  Appears better than one would expect given her health issues  HEENT: Atraumatic, Normocephalic. Neck supple. No masses, No LAD. Ears and Nose: No external deformity. CV: RRR, No M/G/R. No JVD. No thrill. No extra heart sounds. PULM: CTA B, no wheezes, crackles. No retractions. No resp. distress. No accessory muscle use. Noted some ronchi in left base ABD: S, NT, ND, +BS. No rebound. She does have hepatomegaly which is known Outlined areas of cellulitis appear to be resolved with no redness or heat She is tender over the left lateral ribs and there is an area of bruising that she developed last week EXTR: No c/c/e NEURO Normal gait.  PSYCH: Normally interactive. Conversant. Not depressed or anxious appearing.  Calm demeanor.   Dg Chest 2 View  Result Date: 03/05/2017 CLINICAL DATA:  Cough and congestion for 1 week EXAM: CHEST  2 VIEW COMPARISON:  10/19/2016 FINDINGS: Cardiac shadow is within normal limits. The lungs are well aerated bilaterally. Previously seen nodular densities are again identified particularly in the right upper lobe.  Minimal left basilar atelectasis is seen. No bony abnormality is noted. IMPRESSION: Stable pulmonary nodules. Minimal left basilar atelectasis. Electronically Signed   By: Inez Catalina M.D.   On: 03/05/2017 12:27   Ct Abdomen Pelvis W Contrast  Result Date: 03/01/2017 CLINICAL DATA:  Periumbilical redness with pain and swelling. EXAM: CT ABDOMEN AND PELVIS WITH CONTRAST TECHNIQUE: Multidetector CT imaging of the abdomen and pelvis was performed using the standard protocol following bolus administration of intravenous contrast. CONTRAST:  162mL ISOVUE-300 IOPAMIDOL (ISOVUE-300) INJECTION 61% COMPARISON:  05/29/2016 FINDINGS: Lower chest: Multiple bilateral pulmonary nodules, as seen on prior study, stable left base atelectasis with small left pleural effusion. Hepatobiliary: Nodular liver contour compatible with cirrhosis 5 mm low-density lesions central liver is stable and likely a cyst. Other scattered tiny low-density lesions are too small to characterize. Marked gallbladder wall thickening is associated with calcified gallstones. No intrahepatic or extrahepatic biliary dilation. Pancreas: No focal mass lesion. No dilatation of the main duct. No intraparenchymal cyst. No peripancreatic edema. Spleen: Spleen is 16.7 cm cranial caudal length and enlarged. Adrenals/Urinary Tract: No adrenal nodule or mass. Kidneys unremarkable. No evidence for hydroureter. The urinary bladder appears normal for the degree of distention. Stomach/Bowel: Stomach is nondistended. No gastric wall thickening. No evidence of outlet obstruction. Duodenum is normally positioned as is the ligament of Treitz. No small bowel wall thickening. No small bowel dilatation. Terminal ileum unremarkable. The appendix is normal. No gross colonic mass. No colonic wall thickening. No substantial diverticular change. Vascular/Lymphatic: No abdominal aortic aneurysm. Portal vein is either markedly attenuated were there has been cavernous transformation.  Dramatic collateralization identified in the left upper abdomen with perigastric and paraesophageal varices. Nodularity in the omentum is likely vascular. There is no gastrohepatic or hepatoduodenal ligament lymphadenopathy. No intraperitoneal or retroperitoneal lymphadenopathy. No pelvic sidewall lymphadenopathy. Reproductive: Fibroid change noted in the uterus including a pedunculated fundal fibroid. There is no adnexal mass. Other: Small volume fluid identified in the para colic gutters bilaterally and in the cul-de-sac. Musculoskeletal: Bone windows reveal no worrisome lytic or sclerotic osseous lesions. Hazy soft tissue attenuation identified in the anterior subcutaneous fat of the lower abdominal  wall, nonspecific but this may be related to infection or inflammation. There is some associated diffuse appearing body wall edema. IMPRESSION: 1. No substantial interval change in exam. 2. Cirrhotic changes in the liver with evidence of portal venous hypertension and dramatic perigastric and paraesophageal varices. 3. No focal hyperenhancing liver mass. 4. Gallbladder wall thickening with gallstones. While this may be related to acute cholecystitis, more likely the gallbladder wall thickening is related to the chronic liver disease. If there is clinical concern for cholecystitis, nuclear scintigraphy would prove helpful to assess for cystic duct obstruction. 5. Small volume ascites in the para colic gutters with small bilateral pleural effusions and dependent atelectasis in the lower lobes. 6. Nodularity again noted in the omentum, specially along the hepatic flexure. This is unchanged and may be related to vascularity. 7. Confluent soft tissue attenuation is seen at the emboli kiss with Hazy soft tissue attenuation in the subcutaneous fat of the anterior lower abdominal wall. These changes may be related to infection or inflammation. No evidence for anterior abdominal wall abscess Electronically Signed   By: Misty Stanley M.D.   On: 03/01/2017 16:57    Assessment and Plan: Medication monitoring encounter - Plan: CBC, Comprehensive metabolic panel  Hospital discharge follow-up - Plan: CBC, Comprehensive metabolic panel  Cough - Plan: DG Chest 2 View  Left sided abdominal pain - Plan: traMADol (ULTRAM) 50 MG tablet  Hypokalemia - Plan: potassium chloride SA (K-DUR,KLOR-CON) 20 MEQ tablet, Basic metabolic panel  Here today to follow-up from a brief admission for cellulitis Her cellulitis is resolving rapidly and she appears to be doing well from this standpoint She does have some congestion in her left lung base, likely related to muscular injury on this side and impeded cough  She is hesitant to take anything for pain, but has used tramadol in the past with success.  Refilled this for her and encouraged her to use it when needed for pain so she is able to keep her lungs open Will repeat her labs today as directed on discharge summary   Signed Lamar Blinks, MD  Received a crital potassium and other labs as below, called pt at 5pm Her platelets are improved, CBC is otherwise stable Renal function is stable K is quite low- pt is on spiro, but also on lasix and had her dose of lactulose increased recently due to elevated ammonia   Results for orders placed or performed in visit on 03/05/17  CBC  Result Value Ref Range   WBC 4.7 4.0 - 10.5 K/uL   RBC 3.18 (L) 3.87 - 5.11 Mil/uL   Platelets 73.0 (L) 150.0 - 400.0 K/uL   Hemoglobin 11.0 (L) 12.0 - 15.0 g/dL   HCT 31.3 (L) 36.0 - 46.0 %   MCV 98.5 78.0 - 100.0 fl   MCHC 35.0 30.0 - 36.0 g/dL   RDW 16.2 (H) 11.5 - 15.5 %  Comprehensive metabolic panel  Result Value Ref Range   Sodium 137 135 - 145 mEq/L   Potassium 2.8 (LL) 3.5 - 5.1 mEq/L   Chloride 101 96 - 112 mEq/L   CO2 28 19 - 32 mEq/L   Glucose, Bld 168 (H) 70 - 99 mg/dL   BUN 12 6 - 23 mg/dL   Creatinine, Ser 1.02 0.40 - 1.20 mg/dL   Total Bilirubin 2.6 (H) 0.2 - 1.2 mg/dL    Alkaline Phosphatase 181 (H) 39 - 117 U/L   AST 55 (H) 0 - 37 U/L   ALT  42 (H) 0 - 35 U/L   Total Protein 5.3 (L) 6.0 - 8.3 g/dL   Albumin 2.8 (L) 3.5 - 5.2 g/dL   Calcium 8.5 8.4 - 10.5 mg/dL   GFR 59.10 (L) >60.00 mL/min   Called her- she is not taking potassium right now. Had been in the past but was stopped when her spiro was increased.  Discussed with pharmD-will have her take 40 meq of potassium chloride x 3 doses, and repeat her K in 36 hours.  Would rather repeat tomorrow but she will be at [facility] all day and cannot come in Will order a BMP for her as a future lab She states understanding of plan  Meds ordered this encounter  Medications  . traMADol (ULTRAM) 50 MG tablet    Sig: Take 1 tablet (50 mg total) by mouth every 12 (twelve) hours as needed.    Dispense:  30 tablet    Refill:  0  . potassium chloride SA (K-DUR,KLOR-CON) 20 MEQ tablet    Sig: Take 3 doses of 40 meq (2 pills) over the next 36 hours.  Then recheck potassium level    Dispense:  30 tablet    Refill:  3

## 2017-03-05 NOTE — Patient Instructions (Signed)
You do have some congestion in your left lung, but no pneumonia. Please use your spirometer from the hospital to keep this lung open.   Use the tramadol as needed for pain- let me know if your pain is not getting better  I will be hoping that your transplant goes ahead!!

## 2017-03-05 NOTE — Telephone Encounter (Signed)
[facility] reporting critical potassium @ 2.8

## 2017-03-07 ENCOUNTER — Other Ambulatory Visit (INDEPENDENT_AMBULATORY_CARE_PROVIDER_SITE_OTHER): Payer: BLUE CROSS/BLUE SHIELD

## 2017-03-07 DIAGNOSIS — E876 Hypokalemia: Secondary | ICD-10-CM | POA: Diagnosis not present

## 2017-03-07 LAB — BASIC METABOLIC PANEL
BUN: 12 mg/dL (ref 6–23)
CALCIUM: 8.2 mg/dL — AB (ref 8.4–10.5)
CO2: 25 mEq/L (ref 19–32)
CREATININE: 1.21 mg/dL — AB (ref 0.40–1.20)
Chloride: 105 mEq/L (ref 96–112)
GFR: 48.52 mL/min — ABNORMAL LOW (ref >60.00)
GLUCOSE: 165 mg/dL — AB (ref 70–99)
Potassium: 3.8 mEq/L (ref 3.5–5.1)
Sodium: 137 mEq/L (ref 135–145)

## 2017-03-07 LAB — CULTURE, BLOOD (ROUTINE X 2)
CULTURE: NO GROWTH
Culture: NO GROWTH
SPECIAL REQUESTS: ADEQUATE
SPECIAL REQUESTS: ADEQUATE

## 2017-03-09 ENCOUNTER — Other Ambulatory Visit: Payer: Self-pay | Admitting: Pulmonary Disease

## 2017-03-11 ENCOUNTER — Encounter (HOSPITAL_BASED_OUTPATIENT_CLINIC_OR_DEPARTMENT_OTHER): Payer: Self-pay

## 2017-03-11 ENCOUNTER — Ambulatory Visit (HOSPITAL_BASED_OUTPATIENT_CLINIC_OR_DEPARTMENT_OTHER)
Admission: RE | Admit: 2017-03-11 | Discharge: 2017-03-11 | Disposition: A | Discharge status: Home / Self Care | Payer: BLUE CROSS/BLUE SHIELD | Source: Ambulatory Visit | Attending: Family Medicine | Admitting: Family Medicine

## 2017-03-11 DIAGNOSIS — Z1231 Encounter for screening mammogram for malignant neoplasm of breast: Secondary | ICD-10-CM

## 2017-03-12 ENCOUNTER — Encounter: Payer: Self-pay | Admitting: Family Medicine

## 2017-03-13 ENCOUNTER — Encounter: Payer: Self-pay | Admitting: Family Medicine

## 2017-03-13 ENCOUNTER — Emergency Department (HOSPITAL_BASED_OUTPATIENT_CLINIC_OR_DEPARTMENT_OTHER): Payer: BLUE CROSS/BLUE SHIELD

## 2017-03-13 ENCOUNTER — Encounter (HOSPITAL_BASED_OUTPATIENT_CLINIC_OR_DEPARTMENT_OTHER): Payer: Self-pay | Admitting: *Deleted

## 2017-03-13 ENCOUNTER — Telehealth: Payer: Self-pay | Admitting: Family Medicine

## 2017-03-13 ENCOUNTER — Emergency Department (HOSPITAL_BASED_OUTPATIENT_CLINIC_OR_DEPARTMENT_OTHER)
Admission: EM | Admit: 2017-03-13 | Discharge: 2017-03-13 | Disposition: A | Discharge status: Home / Self Care | Payer: BLUE CROSS/BLUE SHIELD | Attending: Physician Assistant | Admitting: Physician Assistant

## 2017-03-13 DIAGNOSIS — S301XXA Contusion of abdominal wall, initial encounter: Secondary | ICD-10-CM | POA: Insufficient documentation

## 2017-03-13 DIAGNOSIS — J45909 Unspecified asthma, uncomplicated: Secondary | ICD-10-CM | POA: Diagnosis not present

## 2017-03-13 DIAGNOSIS — X501XXA Overexertion from prolonged static or awkward postures, initial encounter: Secondary | ICD-10-CM | POA: Diagnosis not present

## 2017-03-13 DIAGNOSIS — D649 Anemia, unspecified: Secondary | ICD-10-CM | POA: Insufficient documentation

## 2017-03-13 DIAGNOSIS — Y929 Unspecified place or not applicable: Secondary | ICD-10-CM | POA: Diagnosis not present

## 2017-03-13 DIAGNOSIS — N183 Chronic kidney disease, stage 3 (moderate): Secondary | ICD-10-CM | POA: Diagnosis not present

## 2017-03-13 DIAGNOSIS — I509 Heart failure, unspecified: Secondary | ICD-10-CM | POA: Diagnosis not present

## 2017-03-13 DIAGNOSIS — D696 Thrombocytopenia, unspecified: Secondary | ICD-10-CM | POA: Diagnosis not present

## 2017-03-13 DIAGNOSIS — Y999 Unspecified external cause status: Secondary | ICD-10-CM | POA: Insufficient documentation

## 2017-03-13 DIAGNOSIS — R1084 Generalized abdominal pain: Secondary | ICD-10-CM | POA: Diagnosis present

## 2017-03-13 DIAGNOSIS — Y939 Activity, unspecified: Secondary | ICD-10-CM | POA: Diagnosis not present

## 2017-03-13 DIAGNOSIS — Z79899 Other long term (current) drug therapy: Secondary | ICD-10-CM | POA: Insufficient documentation

## 2017-03-13 DIAGNOSIS — I130 Hypertensive heart and chronic kidney disease with heart failure and stage 1 through stage 4 chronic kidney disease, or unspecified chronic kidney disease: Secondary | ICD-10-CM | POA: Insufficient documentation

## 2017-03-13 LAB — CBC
HEMATOCRIT: 30.1 % — AB (ref 36.0–46.0)
Hemoglobin: 10.4 g/dL — ABNORMAL LOW (ref 12.0–15.0)
MCH: 34 pg (ref 26.0–34.0)
MCHC: 34.6 g/dL (ref 30.0–36.0)
MCV: 98.4 fL (ref 78.0–100.0)
Platelets: 81 10*3/uL — ABNORMAL LOW (ref 150–400)
RBC: 3.06 MIL/uL — AB (ref 3.87–5.11)
RDW: 15.6 % — AB (ref 11.5–15.5)
WBC: 6.6 10*3/uL (ref 4.0–10.5)

## 2017-03-13 LAB — COMPREHENSIVE METABOLIC PANEL WITH GFR
ALT: 28 U/L (ref 14–54)
AST: 33 U/L (ref 15–41)
Albumin: 2.5 g/dL — ABNORMAL LOW (ref 3.5–5.0)
Alkaline Phosphatase: 186 U/L — ABNORMAL HIGH (ref 38–126)
Anion gap: 8 (ref 5–15)
BUN: 16 mg/dL (ref 6–20)
CO2: 29 mmol/L (ref 22–32)
Calcium: 8 mg/dL — ABNORMAL LOW (ref 8.9–10.3)
Chloride: 99 mmol/L — ABNORMAL LOW (ref 101–111)
Creatinine, Ser: 1.15 mg/dL — ABNORMAL HIGH (ref 0.44–1.00)
GFR calc Af Amer: 60 mL/min — ABNORMAL LOW (ref >60)
GFR calc non Af Amer: 51 mL/min — ABNORMAL LOW (ref >60)
Glucose, Bld: 79 mg/dL (ref 65–99)
Potassium: 3.3 mmol/L — ABNORMAL LOW (ref 3.5–5.1)
Sodium: 136 mmol/L (ref 135–145)
Total Bilirubin: 2.7 mg/dL — ABNORMAL HIGH (ref 0.3–1.2)
Total Protein: 5.6 g/dL — ABNORMAL LOW (ref 6.5–8.1)

## 2017-03-13 LAB — AMMONIA: Ammonia: 62 umol/L — ABNORMAL HIGH (ref 9–35)

## 2017-03-13 LAB — LIPASE, BLOOD: Lipase: 28 U/L (ref 11–51)

## 2017-03-13 LAB — PROTIME-INR
INR: 1.12
PROTHROMBIN TIME: 14.5 s (ref 11.4–15.2)

## 2017-03-13 MED ORDER — IOPAMIDOL (ISOVUE-300) INJECTION 61%
100.0000 mL | Freq: Once | INTRAVENOUS | Status: AC | PRN
  Administered 2017-03-13: 100 mL via INTRAVENOUS

## 2017-03-13 NOTE — ED Notes (Signed)
Patient transported to CT 

## 2017-03-13 NOTE — ED Triage Notes (Signed)
Large amount of bruising to her left abdomen. States she coughed a couple of weeks ago and bruising afterward. States she is on a waiting list for liver transplant.

## 2017-03-13 NOTE — ED Provider Notes (Signed)
[facility] DEPT [facility] Provider Note   CSN: [id number] Arrival date & time: 03/13/17  1215     History   Chief Complaint Chief Complaint  Patient presents with  . Bleeding/Bruising    HPI Alicia [name] is a 56 y.o. female.  HPI  56 year old female with PMH of thrombocytopenia and end-stage liver failure and cirrhosis currently on transplant list at [facility] who presents with abdominal bruising. Reports that she suffered the bruise this past weekend after significant coughing spell and felt her abdominal muscles tear. Bruising has been extending.   She was recently hospitalized overnight earlier this month for cellulitis of the abdomen. She reports this bruising is different and new. Also endorsing abdominal distention. Reports compliance with Furosemide and Spironolactone. She has not previously received paracentesis and reports distention typically managed with PO or IV diuretics depending on severity.   Past Medical History:  Diagnosis Date  . Allergy   . Anemia   . Asthma   . Cellulitis   . Hepatic fibrosis   . Sarcoidosis     Patient Active Problem List   Diagnosis Date Noted  . Cellulitis, abdominal wall 03/01/2017  . Normocytic anemia 03/01/2017  . Hypokalemia 03/01/2017  . CKD (chronic kidney disease), stage III 03/01/2017  . Abdominal wall cellulitis 03/01/2017  . Adrenal insufficiency ([place]) 03/01/2017  . Hyperammonemia ([place])   . Cellulitis 06/28/2016  . Thrombocytopenia ([place]) 06/28/2016  . AKI (acute kidney injury) ([place]) 06/28/2016  . Multiple pulmonary nodules determined by computed tomography of lung   . Chronic cough 09/07/2015  . Hypertension, portal ([place]) 09/04/2011  . Asthma with chronic obstructive pulmonary disease (COPD) ([place]) 09/04/2011  . 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 GERD (gastroesophageal reflux disease) 07/04/2011  . Sarcoid ([place]) 07/01/2011  . Asthma, severe persistent, with upper airway instability 06/13/2011  . Congenital hepatic  fibrosis 07/03/2000    Past Surgical History:  Procedure Laterality Date  . SPINAL FUSION  12/2007   Double spinal Fusion  . VIDEO BRONCHOSCOPY Bilateral 12/08/2015   Procedure: VIDEO BRONCHOSCOPY WITH FLUORO;  Surgeon: Rigoberto Noel, MD;  Location: [facility];  Service: Cardiopulmonary;  Laterality: Bilateral;    OB History    No data available       Home Medications    Prior to Admission medications   Medication Sig Start Date End Date Taking? Authorizing Provider  albuterol (PROVENTIL HFA;VENTOLIN HFA) 108 (90 Base) MCG/ACT inhaler Inhale 1-2 puffs into the lungs every 6 (six) hours as needed for wheezing or shortness of breath. 10/19/16   Palumbo, April, MD  benzonatate (TESSALON) 100 MG capsule Take 100 mg by mouth 3 (three) times daily as needed. 01/30/17   [provider]  cephALEXin (KEFLEX) 500 MG capsule Take 1 capsule (500 mg total) by mouth 4 (four) times daily. 03/02/17   Barton Dubois, MD  DULERA 200-5 MCG/ACT AERO INHALE 2 PUFFS BY MOUTH TWO TIMES DAILY. 02/10/17   Rigoberto Noel, MD  Fluticasone Propionate, Inhal, (FLOVENT DISKUS) 100 MCG/BLIST AEPB Inhale 1 Inhaler into the lungs 2 (two) times daily. 10/19/16   Palumbo, April, MD  furosemide (LASIX) 40 MG tablet Take 120 mg by mouth daily. Monday and Thursday pt will take another 120mg  at night    [provider]  ibuprofen (ADVIL,MOTRIN) 200 MG tablet Take 400 mg by mouth every 6 (six) hours as needed for headache, mild pain or moderate pain.    [provider]  ipratropium-albuterol (DUONEB) 0.5-2.5 (3) MG/3ML SOLN TAKE 3  MLS BY NEBULIZATION EVERY 4 (FOUR) HOURS AS NEEDED. 11/18/16   Copland, Gay Filler, MD  lactulose (CHRONULAC) 10 GM/15ML solution Take 20 g by mouth 2 (two) times daily. 06/27/16   [provider]  pantoprazole (PROTONIX) 40 MG tablet Take 1 tablet by mouth 2 (two) times daily.  04/05/11   [provider]  potassium chloride SA (K-DUR,KLOR-CON) 20 MEQ tablet Take 3  doses of 40 meq (2 pills) over the next 36 hours.  Then recheck potassium level 03/05/17   Copland, Gay Filler, MD  ranitidine (ZANTAC) 300 MG tablet TAKE 1 TABLET AT BEDTIME 12/20/16   Rigoberto Noel, MD  SPIRIVA RESPIMAT 2.5 MCG/ACT AERS Inhale 2 puffs into the lungs daily. 01/27/17   [provider]  spironolactone (ALDACTONE) 100 MG tablet Take 300 mg by mouth daily.     [provider]  traMADol (ULTRAM) 50 MG tablet Take 1 tablet (50 mg total) by mouth every 12 (twelve) hours as needed. 03/05/17   Copland, Gay Filler, MD    Family History Family History  Problem Relation Age of Onset  . Arthritis Mother   . Heart disease Father        Triple bypass  . Migraines Father   . Heart attack Paternal Grandmother   . Stroke Paternal Grandfather   . Liver disease Sister        Juvenile cysitc liver disease  . Asthma Sister     Social History Social History  Substance Use Topics  . Smoking status: Never Smoker  . Smokeless tobacco: Never Used  . Alcohol use No     Allergies   Aspirin; Azithromycin; Gabapentin; and Peanut-containing drug products   Review of Systems Review of Systems  Constitutional: Negative for chills and fever.  Cardiovascular: Negative for chest pain.  Gastrointestinal: Positive for abdominal distention. Negative for abdominal pain, constipation, diarrhea, nausea and vomiting.  Genitourinary: Negative for difficulty urinating and dysuria.  Hematological: Bruises/bleeds easily.     Physical Exam Updated Vital Signs BP 121/69 (BP Location: Right Arm)   Pulse 86   Temp 99.1 F (37.3 C) (Oral)   Resp 18   Ht 5\' 3"  (1.6 m)   Wt 69.9 kg (154 lb)   SpO2 98%   BMI 27.28 kg/m   Physical Exam  Constitutional: She appears well-developed and well-nourished. No distress.  HENT:  Mouth/Throat: Oropharynx is clear and moist.  Eyes: EOM are normal. Pupils are equal, round, and reactive to light. Scleral icterus is present.  Neck: Normal range of  motion. Neck supple.  Cardiovascular: Normal rate, regular rhythm and normal heart sounds.   No murmur heard. Pulmonary/Chest: Effort normal and breath sounds normal. No respiratory distress. She has no wheezes.  Abdominal: Soft. Bowel sounds are normal. She exhibits distension. There is no rebound and no guarding.  TTP over left side abdomen in the area of bruising, otherwise non-tender.   Skin: Skin is warm and dry.  Bruising present on extremities. See picture below for skin findings on abdomen.         ED Treatments / Results  Labs (all labs ordered are listed, but only abnormal results are displayed) Labs Reviewed  COMPREHENSIVE METABOLIC PANEL - Abnormal; Notable for the following:       Result Value   Potassium 3.3 (*)    Chloride 99 (*)    Creatinine, Ser 1.15 (*)    Calcium 8.0 (*)    Total Protein 5.6 (*)    Albumin 2.5 (*)  Alkaline Phosphatase 186 (*)    Total Bilirubin 2.7 (*)    GFR calc non Af Amer 51 (*)    GFR calc Af Amer 60 (*)    All other components within normal limits  CBC - Abnormal; Notable for the following:    RBC 3.06 (*)    Hemoglobin 10.4 (*)    HCT 30.1 (*)    RDW 15.6 (*)    Platelets 81 (*)    All other components within normal limits  AMMONIA - Abnormal; Notable for the following:    Ammonia 62 (*)    All other components within normal limits  PROTIME-INR  LIPASE, BLOOD    EKG  EKG Interpretation None       Radiology Ct Abdomen Pelvis W Contrast  Result Date: 03/13/2017 CLINICAL DATA:  Patient felt a pop on the left side of the upper abdomen after fall. Assess for hematoma. EXAM: CT ABDOMEN AND PELVIS WITH CONTRAST TECHNIQUE: Multidetector CT imaging of the abdomen and pelvis was performed using the standard protocol following bolus administration of intravenous contrast. CONTRAST:  169mL ISOVUE-300 IOPAMIDOL (ISOVUE-300) INJECTION 61% COMPARISON:  March 01, 2017, CT chest October 19, 2016 FINDINGS: Lower chest: There are  multiple 2-3 mm nodules in bilateral lung bases unchanged compared prior chest CT of October 19, 2016. The heart size is normal. Hepatobiliary: There is nodular contour of the liver with enlargement of left lobe liver consistent with cirrhosis of liver. There is a 5 mm hepatic cysts unchanged. Multiple varices are identified throughout the upper abdomen. Gallstones are identified within the gallbladder. The biliary tree is normal. Pancreas: Unremarkable. No pancreatic ductal dilatation or surrounding inflammatory changes. Spleen: The spleen is enlarged.  No focal splenic lesion is noted. Adrenals/Urinary Tract: The adrenal glands are normal. The kidneys are normal. There is no hydronephrosis bilaterally. The bladder is normal. Stomach/Bowel: There is no small bowel obstruction or diverticulitis. The appendix is normal. The stomach normal. Vascular/Lymphatic: The aorta is normal. Extensive varices are identified in the upper abdomen. There is no abdominal or pelvic lymphadenopathy. Reproductive: Uterine fibroid is unchanged. There is no adnexal mass. Other: Small volume ascites is identified in the bilateral para colic gutters and in the pelvis. Musculoskeletal: There is evidence of mild third spacing of fluid in the subcutaneous fat of abdomen and pelvis. No acute abnormalities identified within the visualized bones. IMPRESSION: No evidence of abdominal hematoma. Changes consistent with cirrhosis of liver with portal hypertension and splenic enlargement. Cholelithiasis. Electronically Signed   By: Abelardo Diesel M.D.   On: 03/13/2017 15:12   Mm Digital Screening Bilateral  Result Date: 03/12/2017 CLINICAL DATA:  Screening. EXAM: DIGITAL SCREENING BILATERAL MAMMOGRAM WITH CAD COMPARISON:  Previous exam(s). ACR Breast Density Category c: The breast tissue is heterogeneously dense, which may obscure small masses. FINDINGS: There are no findings suspicious for malignancy. Images were processed with CAD. IMPRESSION:  No mammographic evidence of malignancy. A result letter of this screening mammogram will be mailed directly to the patient. RECOMMENDATION: Screening mammogram in one year. (Code:SM-B-01Y) BI-RADS CATEGORY  1: Negative. Electronically Signed   By: Trude Mcburney M.D.   On: 03/12/2017 12:10    Procedures Procedures (including critical care time)  Medications Ordered in ED Medications  iopamidol (ISOVUE-300) 61 % injection 100 mL (100 mLs Intravenous Contrast Given 03/13/17 1445)     Initial Impression / Assessment and Plan / ED Course  I have reviewed the triage vital signs and the nursing notes.  Pertinent labs &  imaging results that were available during my care of the patient were reviewed by me and considered in my medical decision making (see chart for details).    56 year old female with history of thrombocytopenia and end stage liver disease awaiting transplant presenting for bruising of abdomen and abdominal distention. Lipase WNL. Ammonia 62 which appears consistent with her baseline. Bilirubin elevated as it has been on recent laboratory values. CBC showed stable thrombocytopenia with platelet count of  81. PT/INR stable. CT abdomen ordered which was negative for abdominal hematoma. Small volume ascites was present in the bilateral paracolic gutters and in pelvis. Splenic enlargement was present, consistent with enlargement present on previous imaging. Discussed with patient that her labs were all at her baseline and that CT abdomen did not show any findings warranting emergent medical intervention. Have recommended that patient follow up with PCP and her specialists. Return precautions were discussed. Patient comfortable with discharge.    Final Clinical Impressions(s) / ED Diagnoses   Final diagnoses:  Contusion of abdominal wall, initial encounter  Thrombocytopenia [facility])    New Prescriptions New Prescriptions   No medications on file     Nicolette Bang,  DO 03/13/17 1529    Macarthur Critchley, MD 03/14/17 [phone]

## 2017-03-13 NOTE — Discharge Instructions (Signed)
Please follow up with your PCP at your already scheduled visit. Please return if bruising extensively worsens, your distention increases, you become short of breath, or have any new other concerning symptoms.

## 2017-03-13 NOTE — Telephone Encounter (Signed)
Patient Name: Alicia [name]  DOB: 1961-04-25    Initial Comment Caller states they're trying to get her ready for a liver transplant. She was coughing so hard, she felt a pop and than felt burning. She's bruised under her breast, under arm. Blood red and purple. Very painful. Some shortness of breath.    Nurse Assessment  Nurse: Raphael Gibney, RN, Vanita Ingles Date/Time (Eastern Time): 03/13/2017 11:06:53 AM  Confirm and document reason for call. If symptomatic, describe symptoms. ---Caller states she is getting ready for a liver transplant at [facility]. She has asthma. She was coughing so hard that she heard a pop and had burning in her chest about 3 weeks aog. She coughed so hard on Saturday that she has a bruise from her navel to left side on her arm. Abdomen looks distended. No chest pain or SOB.  Does the patient have any new or worsening symptoms? ---Yes  Will a triage be completed? ---Yes  Related visit to physician within the last 2 weeks? ---No  Does the PT have any chronic conditions? (i.e. diabetes, asthma, etc.) ---Yes  List chronic conditions. ---getting ready for a liver transplant; asthma  Is this a behavioral health or substance abuse call? ---No     Guidelines    Guideline Title Affirmed Question Affirmed Notes  Bruises [1] Raised bruise AND [2] size > 2 inches (5 cm) AND [3] expanding    Final Disposition User   See Physician within 4 Hours (or PCP triage) Raphael Gibney, RN, [place]    Comments  No appts available at [facility] or [facility] states she will go to urgent care.  Has a bruise from her navel to the left side under her arm   Referrals  GO TO FACILITY OTHER - SPECIFY   Disagree/Comply: Comply

## 2017-03-13 NOTE — Telephone Encounter (Signed)
error:[id number] ° °

## 2017-03-17 ENCOUNTER — Other Ambulatory Visit: Payer: Self-pay | Admitting: Pulmonary Disease

## 2017-03-18 NOTE — Progress Notes (Signed)
[facility] at [facility] [address] [place], [place] [address] [phone]  Date:  03/19/2017   Name:  Alicia [name]   DOB:  08/16/1961   MRN:  [id number]  PCP:  Darreld Mclean, MD    Chief Complaint: Annual Exam   History of Present Illness:  Alicia [name] is a 56 y.o. very pleasant female patient who presents with the following:  Here today for a CPE- she is on the transplant list at [facility] for a liver!  She is excited but apprehensive about this prospect.    She continues to have easy bruising and bleeding, and is feeling very tired. Overall her condition is stable and she is feeling at her baseline. She does have some LE edema but this is at baseline as well She needs a pap today to finish her pre-transplant work up- last pap was a few years ago   She had to go to the ER last week when she had a hematoma in her belly - she had a CT and was allowed to go home  Her breathing   Wt Readings from Last 3 Encounters:  03/19/17 148 lb 6.4 oz (67.3 kg)  03/13/17 154 lb (69.9 kg)  03/05/17 154 lb 9.6 oz (70.1 kg)   Pulse Readings from Last 3 Encounters:  03/19/17 (!) 102  03/13/17 86  03/05/17 86   Her contact person at the [facility] Transplant service is Gurney Maxin-  Fax number is [phone]- need to fax her labs and pap results there  Patient Active Problem List   Diagnosis Date Noted  . Cellulitis, abdominal wall 03/01/2017  . Normocytic anemia 03/01/2017  . Hypokalemia 03/01/2017  . CKD (chronic kidney disease), stage III 03/01/2017  . Abdominal wall cellulitis 03/01/2017  . Adrenal insufficiency ([place]) 03/01/2017  . Hyperammonemia ([place])   . Cellulitis 06/28/2016  . Thrombocytopenia ([place]) 06/28/2016  . AKI (acute kidney injury) ([place]) 06/28/2016  . Multiple pulmonary nodules determined by computed tomography of lung   . Chronic cough 09/07/2015  . Hypertension, portal ([place]) 09/04/2011  . Asthma with chronic  obstructive pulmonary disease (COPD) ([place]) 09/04/2011  . 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 GERD (gastroesophageal reflux disease) 07/04/2011  . Sarcoid ([place]) 07/01/2011  . Asthma, severe persistent, with upper airway instability 06/13/2011  . 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 Allergy   . Anemia   . Asthma   . Cellulitis   . Hepatic fibrosis   . Sarcoidosis     Past Surgical History:  Procedure Laterality Date  . SPINAL FUSION  12/2007   Double spinal Fusion  . VIDEO BRONCHOSCOPY Bilateral 12/08/2015   Procedure: VIDEO BRONCHOSCOPY WITH FLUORO;  Surgeon: Rigoberto Noel, MD;  Location: [facility];  Service: Cardiopulmonary;  Laterality: Bilateral;    Social History  Substance Use Topics  . Smoking status: Never Smoker  . Smokeless tobacco: Never Used  . Alcohol use No    Family History  Problem Relation Age of Onset  . Arthritis Mother   . Heart disease Father        Triple bypass  . Migraines Father   . Heart attack Paternal Grandmother   . Stroke Paternal Grandfather   . Liver disease Sister        Juvenile cysitc liver disease  . Asthma Sister     Allergies  Allergen Reactions  . Aspirin Other (  See Comments)    Causes Asthma  . Azithromycin Nausea And Vomiting  . Gabapentin Other (See Comments)    Blisters in mouth, nausea   . Peanut-Containing Drug Products     Asthma, eye swelling, itchy eyes, can't catch her breath    Medication list has been reviewed and updated.  Current Outpatient Prescriptions on File Prior to Visit  Medication Sig Dispense Refill  . albuterol (PROVENTIL HFA;VENTOLIN HFA) 108 (90 Base) MCG/ACT inhaler Inhale 1-2 puffs into the lungs every 6 (six) hours as needed for wheezing or shortness of breath. 1 Inhaler 0  . benzonatate (TESSALON) 100 MG capsule Take 100 mg by mouth 3 (three) times daily as needed.  2  . cephALEXin (KEFLEX) 500 MG capsule Take 1 capsule (500 mg total) by mouth 4  (four) times daily. 36 capsule 0  . DULERA 200-5 MCG/ACT AERO INHALE 2 PUFFS BY MOUTH TWO TIMES DAILY. 13 Inhaler 0  . Fluticasone Propionate, Inhal, (FLOVENT DISKUS) 100 MCG/BLIST AEPB Inhale 1 Inhaler into the lungs 2 (two) times daily. 1 each 0  . furosemide (LASIX) 40 MG tablet Take 120 mg by mouth daily. Monday and Thursday pt will take another 120mg  at night    . ibuprofen (ADVIL,MOTRIN) 200 MG tablet Take 400 mg by mouth every 6 (six) hours as needed for headache, mild pain or moderate pain.    Marland Kitchen ipratropium-albuterol (DUONEB) 0.5-2.5 (3) MG/3ML SOLN TAKE 3 MLS BY NEBULIZATION EVERY 4 (FOUR) HOURS AS NEEDED. 360 mL 0  . lactulose (CHRONULAC) 10 GM/15ML solution Take 20 g by mouth 2 (two) times daily.    . pantoprazole (PROTONIX) 40 MG tablet Take 1 tablet by mouth 2 (two) times daily.     . potassium chloride SA (K-DUR,KLOR-CON) 20 MEQ tablet Take 3 doses of 40 meq (2 pills) over the next 36 hours.  Then recheck potassium level 30 tablet 3  . ranitidine (ZANTAC) 300 MG tablet TAKE 1 TABLET AT BEDTIME 90 tablet 1  . SPIRIVA RESPIMAT 2.5 MCG/ACT AERS Inhale 2 puffs into the lungs daily.  11  . spironolactone (ALDACTONE) 100 MG tablet Take 300 mg by mouth daily.     . traMADol (ULTRAM) 50 MG tablet Take 1 tablet (50 mg total) by mouth every 12 (twelve) hours as needed. 30 tablet 0   No current facility-administered medications on file prior to visit.     Review of Systems:  As per HPI- otherwise negative.   Physical Examination: Vitals:   03/19/17 1535  BP: 121/73  Pulse: (!) 102  Temp: 98.5 F (36.9 C)   Vitals:   03/19/17 1535  Weight: 148 lb 6.4 oz (67.3 kg)  Height: 5\' 3"  (1.6 m)   Body mass index is 26.29 kg/m. Ideal Body Weight: Weight in (lb) to have BMI = 25: 140.8  GEN: WDWN, NAD, Non-toxic, A & O x 3, looks her normal self.  She has diffuse bruising and thinning skin.  Large bruise on her left abdomen  HEENT: Atraumatic, Normocephalic. Neck supple. No masses, No  LAD. Ears and Nose: No external deformity. CV: RRR, No M/G/R. No JVD. No thrill. No extra heart sounds. PULM: CTA B, no wheezes, crackles, rhonchi. No retractions. No resp. distress. No accessory muscle use. ABD: S, ND, +BS. No rebound. No HSM.  Liver is enlarged - usual for her. Tender only over her large bruise EXTR: No c/c. She does have some bilateral LE edema and is wearing compression hose for this NEURO Normal gait.  PSYCH: Normally interactive. Conversant. Not depressed or anxious appearing.  Calm demeanor.  Pelvic: normal, no vaginal lesions or discharge. Uterus normal, no CMT, no adnexal tendereness or masses Pap collected today    Assessment and Plan: Physical exam  Screening for cervical cancer - Plan: Cytology - PAP  Acute liver failure without hepatic coma - Plan: Comprehensive metabolic panel, INR/PT  Here today for a CPE and pap- the last step in her pre-transplant evaluation!   Will send her pap results and routine labs as above to the [facility] transplant service for her   Signed Lamar Blinks, MD

## 2017-03-19 ENCOUNTER — Other Ambulatory Visit (HOSPITAL_COMMUNITY)
Admission: RE | Admit: 2017-03-19 | Discharge: 2017-03-19 | Disposition: A | Discharge status: Home / Self Care | Payer: BLUE CROSS/BLUE SHIELD | Source: Ambulatory Visit | Attending: Family Medicine | Admitting: Family Medicine

## 2017-03-19 ENCOUNTER — Encounter: Payer: Self-pay | Admitting: Family Medicine

## 2017-03-19 ENCOUNTER — Ambulatory Visit (INDEPENDENT_AMBULATORY_CARE_PROVIDER_SITE_OTHER): Payer: BLUE CROSS/BLUE SHIELD | Admitting: Family Medicine

## 2017-03-19 VITALS — BP 121/73 | HR 102 | Temp 98.5°F | Ht 63.0 in | Wt 148.4 lb

## 2017-03-19 DIAGNOSIS — K7200 Acute and subacute hepatic failure without coma: Secondary | ICD-10-CM | POA: Diagnosis not present

## 2017-03-19 DIAGNOSIS — Z124 Encounter for screening for malignant neoplasm of cervix: Secondary | ICD-10-CM | POA: Diagnosis not present

## 2017-03-19 DIAGNOSIS — Z0000 Encounter for general adult medical examination without abnormal findings: Secondary | ICD-10-CM

## 2017-03-19 NOTE — Patient Instructions (Signed)
It was great to see you today- I am very excited for you about your being on the transplant list!   We will get your labs and pap for you today and I will fax to your [facility] team

## 2017-03-20 LAB — COMPREHENSIVE METABOLIC PANEL
ALT: 34 U/L (ref 0–35)
AST: 56 U/L — ABNORMAL HIGH (ref 0–37)
Albumin: 2.7 g/dL — ABNORMAL LOW (ref 3.5–5.2)
Alkaline Phosphatase: 175 U/L — ABNORMAL HIGH (ref 39–117)
BUN: 12 mg/dL (ref 6–23)
CALCIUM: 8.4 mg/dL (ref 8.4–10.5)
CHLORIDE: 101 meq/L (ref 96–112)
CO2: 27 meq/L (ref 19–32)
Creatinine, Ser: 1.12 mg/dL (ref 0.40–1.20)
GFR: 53.04 mL/min — AB (ref >60.00)
GLUCOSE: 230 mg/dL — AB (ref 70–99)
POTASSIUM: 3.8 meq/L (ref 3.5–5.1)
Sodium: 135 mEq/L (ref 135–145)
Total Bilirubin: 2.9 mg/dL — ABNORMAL HIGH (ref 0.2–1.2)
Total Protein: 5.7 g/dL — ABNORMAL LOW (ref 6.0–8.3)

## 2017-03-20 LAB — PROTIME-INR
INR: 1.2 ratio — ABNORMAL HIGH (ref 0.8–1.0)
PROTHROMBIN TIME: 13.1 s (ref 9.6–13.1)

## 2017-03-21 LAB — CYTOLOGY - PAP
Adequacy: ABSENT
DIAGNOSIS: NEGATIVE
HPV (WINDOPATH): NOT DETECTED

## 2017-03-24 ENCOUNTER — Telehealth: Payer: Self-pay | Admitting: *Deleted

## 2017-03-24 NOTE — Telephone Encounter (Signed)
Received request for Medical records from [facility], requesting 03/19/17 OV notes as STAT request-urgent; patient is on liver transplant list. Ok per Dr. Lorelei Pont to forward requested notes; forwarded to provider to initial incoming request/SLS 06/25

## 2017-04-04 ENCOUNTER — Telehealth: Payer: Self-pay | Admitting: Family Medicine

## 2017-04-04 ENCOUNTER — Encounter: Payer: Self-pay | Admitting: Family Medicine

## 2017-04-04 DIAGNOSIS — K7200 Acute and subacute hepatic failure without coma: Secondary | ICD-10-CM

## 2017-04-04 NOTE — Telephone Encounter (Signed)
Caller name: Audelia Relation to pt: self Call back number: [phone] Pharmacy:  Reason for call: Pt came in office stating was here to get labs done, pt did not have any orders on file. Pt mentioned that providers does ask to have labs done every two wks. Please verify and if so please put in orders for labs so pt can come to office to have it done. Please advise.

## 2017-04-07 ENCOUNTER — Other Ambulatory Visit (INDEPENDENT_AMBULATORY_CARE_PROVIDER_SITE_OTHER): Payer: BLUE CROSS/BLUE SHIELD

## 2017-04-07 DIAGNOSIS — K7200 Acute and subacute hepatic failure without coma: Secondary | ICD-10-CM

## 2017-04-08 LAB — COMPREHENSIVE METABOLIC PANEL
ALBUMIN: 2.8 g/dL — AB (ref 3.5–5.2)
ALT: 39 U/L — ABNORMAL HIGH (ref 0–35)
AST: 52 U/L — ABNORMAL HIGH (ref 0–37)
Alkaline Phosphatase: 175 U/L — ABNORMAL HIGH (ref 39–117)
BUN: 11 mg/dL (ref 6–23)
CALCIUM: 8.3 mg/dL — AB (ref 8.4–10.5)
CHLORIDE: 102 meq/L (ref 96–112)
CO2: 30 meq/L (ref 19–32)
Creatinine, Ser: 1.02 mg/dL (ref 0.40–1.20)
GFR: 59.08 mL/min — AB (ref >60.00)
Glucose, Bld: 120 mg/dL — ABNORMAL HIGH (ref 70–99)
Potassium: 3.3 mEq/L — ABNORMAL LOW (ref 3.5–5.1)
Sodium: 139 mEq/L (ref 135–145)
Total Bilirubin: 3.2 mg/dL — ABNORMAL HIGH (ref 0.2–1.2)
Total Protein: 5.6 g/dL — ABNORMAL LOW (ref 6.0–8.3)

## 2017-04-08 LAB — PROTIME-INR
INR: 1.3 ratio — AB (ref 0.8–1.0)
PROTHROMBIN TIME: 13.5 s — AB (ref 9.6–13.1)

## 2017-04-10 NOTE — Progress Notes (Signed)
Results faxed.

## 2017-04-11 ENCOUNTER — Ambulatory Visit (INDEPENDENT_AMBULATORY_CARE_PROVIDER_SITE_OTHER): Payer: BLUE CROSS/BLUE SHIELD

## 2017-04-11 ENCOUNTER — Ambulatory Visit (INDEPENDENT_AMBULATORY_CARE_PROVIDER_SITE_OTHER): Payer: BLUE CROSS/BLUE SHIELD | Admitting: Orthopaedic Surgery

## 2017-04-11 DIAGNOSIS — M79645 Pain in left finger(s): Secondary | ICD-10-CM | POA: Diagnosis not present

## 2017-04-11 DIAGNOSIS — M67442 Ganglion, left hand: Secondary | ICD-10-CM | POA: Diagnosis not present

## 2017-04-11 NOTE — Progress Notes (Signed)
Office Visit Note   Patient: Alicia [name]           Date of Birth: Nov 02, 1960           MRN: [id number] Visit Date: 04/11/2017              Requested by: Darreld Mclean, MD [address] [place], [place] [address] PCP: Darreld Mclean, MD   Assessment & Plan: Visit Diagnoses:  1. Mucous cyst of digit of left hand     Plan: Left index mucous cyst. Sounds like she is pretty symptomatic from this and is bothering her quite a bit. However she is on the liver transplant list but she feels that she is nowhere near the top of the list for liver currently. She would like to get this mucous cyst taking care of first. We discussed the risks benefits alternatives of surgery including the details of the surgery. She understands and wishes to proceed. She will give [place] a call back when she is ready to schedule surgery.  Follow-Up Instructions: Return if symptoms worsen or fail to improve.   Orders:  Orders Placed This Encounter  Procedures  . XR Finger Index Left   No orders of the defined types were placed in this encounter.     Procedures: No procedures performed   Clinical Data: No additional findings.   Subjective: Chief Complaint  Patient presents with  . Left Index Finger - Pain    Alicia [name] is a 56 year old female comes in with a left index finger mucous cyst that's progressively gotten worse over the last year. She endorses significant throbbing pain that's worse when she bumps it. She denies any drainage or infection. Denies any constitutional symptoms. A dermatologist attempted to remove this but was then sent to [place].    Review of Systems  Constitutional: Negative.   HENT: Negative.   Eyes: Negative.   Respiratory: Negative.   Cardiovascular: Negative.   Endocrine: Negative.   Musculoskeletal: Negative.   Neurological: Negative.   Hematological: Negative.   Psychiatric/Behavioral: Negative.   All other systems reviewed and are  negative.    Objective: Vital Signs: There were no vitals taken for this visit.  Physical Exam  Constitutional: She is oriented to person, place, and time. She appears well-developed and well-nourished.  HENT:  Head: Normocephalic and atraumatic.  Eyes: EOM are normal.  Neck: Neck supple.  Pulmonary/Chest: Effort normal.  Abdominal: Soft.  Neurological: She is alert and oriented to person, place, and time.  Skin: Skin is warm. Capillary refill takes less than 2 seconds.  Psychiatric: She has a normal mood and affect. Her behavior is normal. Judgment and thought content normal.  Nursing note and vitals reviewed.   Ortho Exam Left index finger shows a dry mucous cyst on the dorsum of her left index finger over the DIP joint. This is tender to palpation. There is no skin changes consistent with infection. There is no drainage. She has good range of motion of the DIP joint. There are no nail changes. Specialty Comments:  No specialty comments available.  Imaging: Xr Finger Index Left  Result Date: 04/11/2017 Osteoarthritis of the DIP joint.    PMFS History: Patient Active Problem List   Diagnosis Date Noted  . Mucous cyst of digit of left hand 04/11/2017  . Cellulitis, abdominal wall 03/01/2017  . Normocytic anemia 03/01/2017  . Hypokalemia 03/01/2017  . CKD (chronic kidney disease), stage III 03/01/2017  . Abdominal wall  cellulitis 03/01/2017  . Adrenal insufficiency ([place]) 03/01/2017  . Hyperammonemia ([place])   . Cellulitis 06/28/2016  . Thrombocytopenia ([place]) 06/28/2016  . AKI (acute kidney injury) ([place]) 06/28/2016  . Multiple pulmonary nodules determined by computed tomography of lung   . Chronic cough 09/07/2015  . 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 Asthma with chronic obstructive pulmonary disease (COPD) ([place]) 09/04/2011  . 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 GERD (gastroesophageal reflux disease) 07/04/2011  . Sarcoid ([place]) 07/01/2011  . Asthma, severe  persistent, with upper airway instability 06/13/2011  . Congenital hepatic fibrosis 07/03/2000   Past Medical History:  Diagnosis Date  . Allergy   . Anemia   . Asthma   . Cellulitis   . Hepatic fibrosis   . Sarcoidosis     Family History  Problem Relation Age of Onset  . Arthritis Mother   . Heart disease Father        Triple bypass  . Migraines Father   . Heart attack Paternal Grandmother   . Stroke Paternal Grandfather   . Liver disease Sister        Juvenile cysitc liver disease  . Asthma Sister     Past Surgical History:  Procedure Laterality Date  . SPINAL FUSION  12/2007   Double spinal Fusion  . VIDEO BRONCHOSCOPY Bilateral 12/08/2015   Procedure: VIDEO BRONCHOSCOPY WITH FLUORO;  Surgeon: Rigoberto Noel, MD;  Location: [facility];  Service: Cardiopulmonary;  Laterality: Bilateral;   Social History   Occupational History  . 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 Smoking status: Never Smoker  . Smokeless tobacco: Never Used  . Alcohol use No  . Drug use: No  . Sexual activity: Not on file

## 2017-04-14 ENCOUNTER — Other Ambulatory Visit: Payer: Self-pay | Admitting: Pulmonary Disease

## 2017-04-16 ENCOUNTER — Telehealth (INDEPENDENT_AMBULATORY_CARE_PROVIDER_SITE_OTHER): Payer: Self-pay | Admitting: Orthopaedic Surgery

## 2017-04-16 NOTE — Telephone Encounter (Signed)
PLEASE CALL PT TO [facility] HER SURGERY PLEASE.  [phone]

## 2017-04-16 NOTE — Telephone Encounter (Signed)
Please review. Thanks!  

## 2017-04-17 NOTE — Telephone Encounter (Signed)
I called patient and scheduled surgery. 

## 2017-04-28 NOTE — Pre-Procedure Instructions (Signed)
Alicia [name]  04/28/2017      [id number] [address], [place] [address] Phone: [phone] Fax: [place], [address] [address] Phone: [phone] Fax: [phone]    Your procedure is scheduled on Thursday August 2.  Report to [facility] Admitting at 12:50 P.M.  Call this number if you have problems the morning of surgery:  [phone]   Remember:  Do not eat food or drink liquids after midnight.  Take these medicines the morning of surgery with A SIP OF WATER: pantoprazole (protonix), prednisone (deltasone), SPIRIVA, DULERA, DUONEB if needed, FLOVENT diskus, albuterol if needed  PLEASE BRING inhalers to hospital with you  7 days prior to surgery STOP taking any Aspirin, Aleve, Naproxen, Ibuprofen, Motrin, Advil, Goody's, BC's, all herbal medications, fish oil, and all vitamins    Do not wear jewelry, make-up or nail polish.  Do not wear lotions, powders, or perfumes, or deoderant.  Do not shave 48 hours prior to surgery.  Men may shave face and neck.  Do not bring valuables to the hospital.  [facility] is not responsible for any belongings or valuables.  Contacts, dentures or bridgework may not be worn into surgery.  Leave your suitcase in the car.  After surgery it may be brought to your room.  For patients admitted to the hospital, discharge time will be determined by your treatment team.  Patients discharged the day of surgery will not be allowed to drive home.    Special instructions:    [facility]- Preparing For Surgery  Before surgery, you can play an important role. Because skin is not sterile, your skin needs to be as free of germs as possible. You can reduce the number of germs on your skin by washing with CHG (chlorahexidine gluconate) Soap before surgery.  CHG is  an antiseptic cleaner which kills germs and bonds with the skin to continue killing germs even after washing.  Please do not use if you have an allergy to CHG or antibacterial soaps. If your skin becomes reddened/irritated stop using the CHG.  Do not shave (including legs and underarms) for at least 48 hours prior to first CHG shower. It is OK to shave your face.  Please follow these instructions carefully.   1. Shower the NIGHT BEFORE SURGERY and the MORNING OF SURGERY with CHG.   2. If you chose to wash your hair, wash your hair first as usual with your normal shampoo.  3. After you shampoo, rinse your hair and body thoroughly to remove the shampoo.  4. Use CHG as you would any other liquid soap. You can apply CHG directly to the skin and wash gently with a scrungie or a clean washcloth.   5. Apply the CHG Soap to your body ONLY FROM THE NECK DOWN.  Do not use on open wounds or open sores. Avoid contact with your eyes, ears, mouth and genitals (private parts). Wash genitals (private parts) with your normal soap.  6. Wash thoroughly, paying special attention to the area where your surgery will be performed.  7. Thoroughly rinse your body with warm water from the neck down.  8. DO NOT shower/wash with your normal soap after using and rinsing off the CHG Soap.  9. Pat yourself dry with a CLEAN TOWEL.   10. Wear CLEAN PAJAMAS  11. Place CLEAN SHEETS on your bed the night of your first shower and DO NOT SLEEP WITH PETS.    Day of Surgery: Do not apply any deodorants/lotions. Please wear clean clothes to the hospital/surgery center.      Please read over the following fact sheets that you were given. Coughing and Deep Breathing

## 2017-04-29 ENCOUNTER — Other Ambulatory Visit (HOSPITAL_COMMUNITY): Payer: BLUE CROSS/BLUE SHIELD

## 2017-04-29 ENCOUNTER — Encounter (HOSPITAL_COMMUNITY)
Admission: RE | Admit: 2017-04-29 | Discharge: 2017-04-29 | Disposition: A | Discharge status: Home / Self Care | Payer: BLUE CROSS/BLUE SHIELD | Source: Ambulatory Visit | Attending: Orthopaedic Surgery | Admitting: Orthopaedic Surgery

## 2017-04-29 ENCOUNTER — Telehealth (INDEPENDENT_AMBULATORY_CARE_PROVIDER_SITE_OTHER): Payer: Self-pay | Admitting: *Deleted

## 2017-04-29 ENCOUNTER — Encounter (HOSPITAL_COMMUNITY): Payer: Self-pay

## 2017-04-29 DIAGNOSIS — Z8489 Family history of other specified conditions: Secondary | ICD-10-CM | POA: Diagnosis not present

## 2017-04-29 DIAGNOSIS — M19042 Primary osteoarthritis, left hand: Secondary | ICD-10-CM | POA: Diagnosis not present

## 2017-04-29 DIAGNOSIS — D869 Sarcoidosis, unspecified: Secondary | ICD-10-CM | POA: Diagnosis not present

## 2017-04-29 DIAGNOSIS — Z8261 Family history of arthritis: Secondary | ICD-10-CM | POA: Diagnosis not present

## 2017-04-29 DIAGNOSIS — Z7951 Long term (current) use of inhaled steroids: Secondary | ICD-10-CM | POA: Diagnosis not present

## 2017-04-29 DIAGNOSIS — Z79899 Other long term (current) drug therapy: Secondary | ICD-10-CM | POA: Diagnosis not present

## 2017-04-29 DIAGNOSIS — M25842 Other specified joint disorders, left hand: Secondary | ICD-10-CM | POA: Diagnosis not present

## 2017-04-29 DIAGNOSIS — J45909 Unspecified asthma, uncomplicated: Secondary | ICD-10-CM | POA: Diagnosis not present

## 2017-04-29 DIAGNOSIS — Z823 Family history of stroke: Secondary | ICD-10-CM | POA: Diagnosis not present

## 2017-04-29 DIAGNOSIS — K219 Gastro-esophageal reflux disease without esophagitis: Secondary | ICD-10-CM | POA: Diagnosis not present

## 2017-04-29 DIAGNOSIS — Z01812 Encounter for preprocedural laboratory examination: Secondary | ICD-10-CM | POA: Diagnosis not present

## 2017-04-29 HISTORY — DX: Personal history of urinary (tract) infections: Z87.440

## 2017-04-29 HISTORY — DX: Splenomegaly, not elsewhere classified: R16.1

## 2017-04-29 HISTORY — DX: Gastro-esophageal reflux disease without esophagitis: K21.9

## 2017-04-29 LAB — CBC
HCT: 32.6 % — ABNORMAL LOW (ref 36.0–46.0)
Hemoglobin: 11 g/dL — ABNORMAL LOW (ref 12.0–15.0)
MCH: 33.5 pg (ref 26.0–34.0)
MCHC: 33.7 g/dL (ref 30.0–36.0)
MCV: 99.4 fL (ref 78.0–100.0)
PLATELETS: 79 10*3/uL — AB (ref 150–400)
RBC: 3.28 MIL/uL — ABNORMAL LOW (ref 3.87–5.11)
RDW: 14.6 % (ref 11.5–15.5)
WBC: 4.8 10*3/uL (ref 4.0–10.5)

## 2017-04-29 LAB — COMPREHENSIVE METABOLIC PANEL
ALBUMIN: 2.5 g/dL — AB (ref 3.5–5.0)
ALK PHOS: 201 U/L — AB (ref 38–126)
ALT: 35 U/L (ref 14–54)
ANION GAP: 7 (ref 5–15)
AST: 58 U/L — AB (ref 15–41)
BILIRUBIN TOTAL: 2.8 mg/dL — AB (ref 0.3–1.2)
BUN: 9 mg/dL (ref 6–20)
CALCIUM: 8.1 mg/dL — AB (ref 8.9–10.3)
CO2: 26 mmol/L (ref 22–32)
CREATININE: 1.14 mg/dL — AB (ref 0.44–1.00)
Chloride: 102 mmol/L (ref 101–111)
GFR calc Af Amer: >60 mL/min (ref >60)
GFR calc non Af Amer: 52 mL/min — ABNORMAL LOW (ref >60)
Glucose, Bld: 278 mg/dL — ABNORMAL HIGH (ref 65–99)
Potassium: 2.8 mmol/L — ABNORMAL LOW (ref 3.5–5.1)
Sodium: 135 mmol/L (ref 135–145)
TOTAL PROTEIN: 5.5 g/dL — AB (ref 6.5–8.1)

## 2017-04-29 LAB — PROTIME-INR
INR: 1.26
PROTHROMBIN TIME: 15.9 s — AB (ref 11.4–15.2)

## 2017-04-29 MED ORDER — POTASSIUM CHLORIDE ER 10 MEQ PO TBCR: 40.0000 meq | EXTENDED_RELEASE_TABLET | Freq: Two times a day (BID) | ORAL | 0 refills | Status: DC

## 2017-04-29 NOTE — Telephone Encounter (Signed)
Cathy left vm on triage wanted Dr.XU to look at pt's lab work. pts' potassuim is abnormal at 2.9, would like a call back [phone].

## 2017-04-29 NOTE — Telephone Encounter (Signed)
Please let patient know I sent in potassium for her to take.

## 2017-04-29 NOTE — Progress Notes (Addendum)
Dr Phoebe Sharps office notified of abnormal lab.Also F/U with Ebony Hail PA

## 2017-04-29 NOTE — Telephone Encounter (Signed)
See message.

## 2017-04-29 NOTE — Progress Notes (Signed)
PCP - [place] (careeverywhere)  Cardiologist - none GI- Shawn Rudnick (careeverywhere)   Chest x-ray - 03/05/17, chest CT 03/13/17 in care everywhere EKG - 03/01/17 Cardiac MRI 02/25/17 Stress Test - 02/03/17 ECHO - 01/03/17  Patient with history of congenital liver fibrosis. Pt states she has no cardiac history. Pt worked up for liver transplant list at [facility]. Pt states she has chronically low platelets and "bleeds easily". Pt also reports that MD unsure whether she has sarcoidosis as well- per pt she has had pulmonologists tell her differently. Pt reports that she does get SOB but this is normal for her. Pt states that she does not feel like she is getting sick.   Patient verbalized understanding of instructions that were given to them at the PAT appointment. Patient was also instructed that they will need to review over the PAT instructions again at home before surgery.

## 2017-04-30 ENCOUNTER — Other Ambulatory Visit (INDEPENDENT_AMBULATORY_CARE_PROVIDER_SITE_OTHER): Payer: Self-pay | Admitting: Orthopaedic Surgery

## 2017-04-30 DIAGNOSIS — M67442 Ganglion, left hand: Secondary | ICD-10-CM

## 2017-04-30 NOTE — Telephone Encounter (Signed)
Called pt to let her know she will start them when she gets them today

## 2017-04-30 NOTE — Progress Notes (Addendum)
Anesthesia Chart Review:  Pt is a 56 year old female scheduled for L index finger cyst excision on 05/01/2017 with Frankey Shown, MD  - PCP is Lamar Blinks, MD - Pulmonologist is Quillian Quince, MD (notes in care everywhere)   - Pt is undergoing work up for liver transplant at [facility]. Saw cardiologist Cloretta Ned, MD 02/03/17 at part of that work up and was cleared for transplant from cardiac perspective (notes in care everywhere).    PMH includes:  Congenital hepatic fibrosis, sarcoidosis of lung, anemia, asthma, splenomegaly, GERD.  Never smoker. BMI 27  - Hospitalized 6/2-3/18 for abdominal wall cellulitis  Medications include: Albuterol, dulera, Flovent, Lasix, DuoNeb, lactulose, Protonix, potassium, prednisone, Zantac, Spiriva, spironolactone  Preoperative labs reviewed.   - PT 15.9 - K 2.8. Dr. Erlinda Hong Rx potassium, will recheck DOS.  - Ca 8.1, consistent with prior results.  - AST 58, alk phos 201; consistent with prior results.  - Glucose 278. Pt does not have hx DM. HbA1c was 4.7 on 02/03/17.  From notes in care everywhere, it appears pt's blood glucose intermittently elevated, possibly due to chronic prednisone use; underwent cortisol stim test yesterday, results not yet available (see care everywhere); elevated glucose does not appear to be related to DM process.  Will recheck glucose DOS.   CXR 03/05/17:  - Stable pulmonary nodules. - Minimal left basilar atelectasis.  EKG 03/01/17: NSR. T wave abnormality, consider anterior ischemia. Prolonged QT  CT chest 03/13/17 (care everywhere):  1. Compared to outside CT from 10/19/2016, there are new small bilateral pleural effusions, as well as a small pericardial effusion. 2. There a few small subcentimeter in size pulmonary nodules. One of these in the left upper lobe has grown slightly from the prior dated 10/19/2016, now measuring 9 mm, previously approximately 4 mm. The remainder are similar in size. These may be due to the patient's history of  pulmonary sarcoidosis. There is no mediastinal or hilar lymphadenopathy. 3. Partially visualized cirrhosis. Additionally, there are small volume ascites, new from prior. Numerous serpiginous structure about the distal esophagus and upper abdomen, partially visualized, in keeping with varices.  PFTs 03/06/17 (care everywhere):  - FEV1 72.8% predicted - FEV1/FVC 81.2% predicted - DLCO 90% predicted  Cardiac MRI 02/25/17 (care everywhere):  1. The left ventricle is normal in cavity size and wall thickness. Global systolic function is normal. The LV ejection fraction is 78%. There are no regional wall motion abnormalities. Small muscular VSD in the midventricular anteroseptum, without evidence of shunting. The Qp/Qs = 1. 2. The right ventricle is normal in cavity size, wall thickness, and systolic function. The RVEF is 79%. 3. Both atria are mildly dilated. 4. The aortic valve is trileaflet with no aortic valve stenosis or regurgitation. There is mild tricuspid regurgitation, and trivial pulmonic insufficiency, no other significant valvular disease.  5. Delayed enhancement imaging demonstrates no evidence of myocardial infarction, scar or infiltrative disease.  6. No LV thrombus seen. 7. Small pericardial effusion, and small left pleural effusion. - FINAL IMPRESSION: No MRI findings to suggest cardiac sarcoid.  Nuclear stress test 02/03/17 (care everywhere):  - Myocardial perfusion imaging is normal. No inducible ischemia at adequate stress target heart rate.  - ECG treadmill stress test is normal; see above.  - Post- stress LVEF= 79% with normal LV systolic function.  Echo 01/03/17 (care everywhere):  1. LV size is normal. Normal LV wall thickness. LV systolic function is normal, EF = 65-70%. LV filling pattern is normal. 2. RV is  normal in size and function. 3. There is no atrial enlargement. 4. There is small amount of right to left shunting after 5-6 beats, suggestive of intrapulmonary  shunt. 5. There is no significant valvular stenosis or regurgitation. There was insufficient TR detected to calculate RV systolic pressure. 6. IVC size was normal. 7. There is trivial pericardial effusion. 8 .Compared to prior echo 10/11/15, there is no significant change.  I left a voicemail for Sherrie in Dr. Phoebe Sharps office about elevated PT, glucose.   If no changes, I anticipate pt can proceed with surgery as scheduled.   Willeen Cass, FNP-BC [facility] Short Stay Surgical Center/Anesthesiology Phone: [phone] 04/30/2017 11:26 AM

## 2017-05-01 ENCOUNTER — Ambulatory Visit (HOSPITAL_COMMUNITY): Payer: BLUE CROSS/BLUE SHIELD | Admitting: Emergency Medicine

## 2017-05-01 ENCOUNTER — Encounter (HOSPITAL_COMMUNITY)
Admission: RE | Disposition: A | Discharge status: Home / Self Care | Payer: Self-pay | Source: Ambulatory Visit | Attending: Orthopaedic Surgery

## 2017-05-01 ENCOUNTER — Ambulatory Visit (HOSPITAL_COMMUNITY)
Admission: RE | Admit: 2017-05-01 | Discharge: 2017-05-01 | Disposition: A | Discharge status: Home / Self Care | Payer: BLUE CROSS/BLUE SHIELD | Source: Ambulatory Visit | Attending: Orthopaedic Surgery | Admitting: Orthopaedic Surgery

## 2017-05-01 ENCOUNTER — Encounter (HOSPITAL_COMMUNITY): Payer: Self-pay | Admitting: Anesthesiology

## 2017-05-01 DIAGNOSIS — J45909 Unspecified asthma, uncomplicated: Secondary | ICD-10-CM | POA: Insufficient documentation

## 2017-05-01 DIAGNOSIS — Z8489 Family history of other specified conditions: Secondary | ICD-10-CM | POA: Insufficient documentation

## 2017-05-01 DIAGNOSIS — Z79899 Other long term (current) drug therapy: Secondary | ICD-10-CM | POA: Insufficient documentation

## 2017-05-01 DIAGNOSIS — D869 Sarcoidosis, unspecified: Secondary | ICD-10-CM | POA: Insufficient documentation

## 2017-05-01 DIAGNOSIS — Z8261 Family history of arthritis: Secondary | ICD-10-CM | POA: Insufficient documentation

## 2017-05-01 DIAGNOSIS — K219 Gastro-esophageal reflux disease without esophagitis: Secondary | ICD-10-CM | POA: Insufficient documentation

## 2017-05-01 DIAGNOSIS — Z01812 Encounter for preprocedural laboratory examination: Secondary | ICD-10-CM | POA: Insufficient documentation

## 2017-05-01 DIAGNOSIS — Z823 Family history of stroke: Secondary | ICD-10-CM | POA: Insufficient documentation

## 2017-05-01 DIAGNOSIS — M67442 Ganglion, left hand: Secondary | ICD-10-CM | POA: Diagnosis not present

## 2017-05-01 DIAGNOSIS — Z7951 Long term (current) use of inhaled steroids: Secondary | ICD-10-CM | POA: Insufficient documentation

## 2017-05-01 DIAGNOSIS — M19042 Primary osteoarthritis, left hand: Secondary | ICD-10-CM | POA: Insufficient documentation

## 2017-05-01 DIAGNOSIS — M25842 Other specified joint disorders, left hand: Secondary | ICD-10-CM | POA: Diagnosis not present

## 2017-05-01 HISTORY — PX: CYST EXCISION: SHX5701

## 2017-05-01 LAB — POCT I-STAT 4, (NA,K, GLUC, HGB,HCT)
GLUCOSE: 102 mg/dL — AB (ref 65–99)
HCT: 30 % — ABNORMAL LOW (ref 36.0–46.0)
Hemoglobin: 10.2 g/dL — ABNORMAL LOW (ref 12.0–15.0)
Potassium: 3.8 mmol/L (ref 3.5–5.1)
SODIUM: 141 mmol/L (ref 135–145)

## 2017-05-01 SURGERY — CYST REMOVAL
Anesthesia: Monitor Anesthesia Care | Site: Finger | Laterality: Left

## 2017-05-01 MED ORDER — OXYCODONE HCL 5 MG PO TABS: 5.0000 mg | ORAL_TABLET | ORAL | 0 refills | Status: DC | PRN

## 2017-05-01 MED ORDER — CEFAZOLIN SODIUM-DEXTROSE 2-4 GM/100ML-% IV SOLN
2.0000 g | INTRAVENOUS | Status: AC
  Administered 2017-05-01: 2 g via INTRAVENOUS
  Filled 2017-05-01: qty 100

## 2017-05-01 MED ORDER — MIDAZOLAM HCL 2 MG/2ML IJ SOLN
INTRAMUSCULAR | Status: AC
  Filled 2017-05-01: qty 2

## 2017-05-01 MED ORDER — OXYCODONE HCL 5 MG PO TABS
ORAL_TABLET | ORAL | Status: AC
  Filled 2017-05-01: qty 2

## 2017-05-01 MED ORDER — 0.9 % SODIUM CHLORIDE (POUR BTL) OPTIME
TOPICAL | Status: DC | PRN
  Administered 2017-05-01: 1000 mL

## 2017-05-01 MED ORDER — LACTATED RINGERS IV SOLN
INTRAVENOUS | Status: DC
  Administered 2017-05-01 (×2): via INTRAVENOUS

## 2017-05-01 MED ORDER — FENTANYL CITRATE (PF) 100 MCG/2ML IJ SOLN
INTRAMUSCULAR | Status: DC | PRN
  Administered 2017-05-01: 50 ug via INTRAVENOUS

## 2017-05-01 MED ORDER — PROPOFOL 10 MG/ML IV BOLUS
INTRAVENOUS | Status: AC
  Filled 2017-05-01: qty 20

## 2017-05-01 MED ORDER — FENTANYL CITRATE (PF) 250 MCG/5ML IJ SOLN
INTRAMUSCULAR | Status: AC
  Filled 2017-05-01: qty 5

## 2017-05-01 MED ORDER — MUPIROCIN CALCIUM 2 % EX CREA
TOPICAL_CREAM | CUTANEOUS | Status: DC | PRN
  Administered 2017-05-01: 1 via TOPICAL

## 2017-05-01 MED ORDER — MUPIROCIN 2 % EX OINT
TOPICAL_OINTMENT | CUTANEOUS | Status: AC
  Filled 2017-05-01: qty 22

## 2017-05-01 MED ORDER — PROPOFOL 10 MG/ML IV BOLUS
INTRAVENOUS | Status: DC | PRN
  Administered 2017-05-01 (×2): 40 mg via INTRAVENOUS

## 2017-05-01 MED ORDER — LIDOCAINE HCL (CARDIAC) 20 MG/ML IV SOLN
INTRAVENOUS | Status: DC | PRN
  Administered 2017-05-01: 80 mg via INTRAVENOUS

## 2017-05-01 MED ORDER — OXYCODONE HCL 5 MG PO TABS
5.0000 mg | ORAL_TABLET | Freq: Once | ORAL | Status: AC
  Administered 2017-05-01: 10 mg via ORAL

## 2017-05-01 MED ORDER — PROPOFOL 500 MG/50ML IV EMUL
INTRAVENOUS | Status: DC | PRN
  Administered 2017-05-01: 100 ug/kg/min via INTRAVENOUS

## 2017-05-01 MED ORDER — BUPIVACAINE HCL (PF) 0.25 % IJ SOLN
INTRAMUSCULAR | Status: DC | PRN
  Administered 2017-05-01: 10 mL

## 2017-05-01 MED ORDER — BUPIVACAINE HCL (PF) 0.25 % IJ SOLN
INTRAMUSCULAR | Status: AC
  Filled 2017-05-01: qty 20

## 2017-05-01 SURGICAL SUPPLY — 57 items
BANDAGE ACE 3X5.8 VEL STRL LF (GAUZE/BANDAGES/DRESSINGS) IMPLANT
BANDAGE COBAN STERILE 2 (GAUZE/BANDAGES/DRESSINGS) ×2 IMPLANT
BLADE MINI RND TIP GREEN BEAV (BLADE) ×2 IMPLANT
BNDG COHESIVE 1X5 TAN STRL LF (GAUZE/BANDAGES/DRESSINGS) IMPLANT
BNDG COHESIVE 4X5 TAN STRL (GAUZE/BANDAGES/DRESSINGS) ×2 IMPLANT
BNDG COHESIVE 6X5 TAN STRL LF (GAUZE/BANDAGES/DRESSINGS) IMPLANT
BNDG CONFORM 3 STRL LF (GAUZE/BANDAGES/DRESSINGS) ×2 IMPLANT
BNDG GAUZE ELAST 4 BULKY (GAUZE/BANDAGES/DRESSINGS) ×2 IMPLANT
BNDG GAUZE STRTCH 6 (GAUZE/BANDAGES/DRESSINGS) IMPLANT
CANISTER SUCTION 2500CC (MISCELLANEOUS) ×2 IMPLANT
CORDS BIPOLAR (ELECTRODE) ×2 IMPLANT
COVER SURGICAL LIGHT HANDLE (MISCELLANEOUS) ×2 IMPLANT
CUFF TOURNIQUET SINGLE 24IN (TOURNIQUET CUFF) IMPLANT
CUFF TOURNIQUET SINGLE 34IN LL (TOURNIQUET CUFF) IMPLANT
CUFF TOURNIQUET SINGLE 44IN (TOURNIQUET CUFF) IMPLANT
DRAIN PENROSE 1/4X12 LTX STRL (WOUND CARE) ×2 IMPLANT
DRAPE INCISE IOBAN 66X45 STRL (DRAPES) IMPLANT
DRAPE U-SHAPE 47X51 STRL (DRAPES) ×2 IMPLANT
DRSG EMULSION OIL 3X3 NADH (GAUZE/BANDAGES/DRESSINGS) ×2 IMPLANT
DURAPREP 26ML APPLICATOR (WOUND CARE) ×2 IMPLANT
ELECT REM PT RETURN 9FT ADLT (ELECTROSURGICAL) ×2
ELECTRODE REM PT RTRN 9FT ADLT (ELECTROSURGICAL) ×1 IMPLANT
GAUZE SPONGE 4X4 12PLY STRL (GAUZE/BANDAGES/DRESSINGS) ×4 IMPLANT
GAUZE XEROFORM 5X9 LF (GAUZE/BANDAGES/DRESSINGS) ×2 IMPLANT
GLOVE SKINSENSE NS SZ7.5 (GLOVE) ×2
GLOVE SKINSENSE STRL SZ7.5 (GLOVE) ×2 IMPLANT
GOWN STRL REIN XL XLG (GOWN DISPOSABLE) ×2 IMPLANT
HANDPIECE INTERPULSE COAX TIP (DISPOSABLE)
KIT BASIN OR (CUSTOM PROCEDURE TRAY) ×2 IMPLANT
KIT ROOM TURNOVER OR (KITS) ×2 IMPLANT
MANIFOLD NEPTUNE II (INSTRUMENTS) IMPLANT
NEEDLE 22X1 1/2 (OR ONLY) (NEEDLE) ×2 IMPLANT
PACK ORTHO EXTREMITY (CUSTOM PROCEDURE TRAY) ×2 IMPLANT
PAD ABD 8X10 STRL (GAUZE/BANDAGES/DRESSINGS) ×2 IMPLANT
PAD ARMBOARD 7.5X6 YLW CONV (MISCELLANEOUS) ×4 IMPLANT
PADDING CAST ABS 4INX4YD NS (CAST SUPPLIES)
PADDING CAST ABS COTTON 4X4 ST (CAST SUPPLIES) IMPLANT
PADDING CAST COTTON 6X4 STRL (CAST SUPPLIES) IMPLANT
SET HNDPC FAN SPRY TIP SCT (DISPOSABLE) IMPLANT
SPONGE LAP 18X18 X RAY DECT (DISPOSABLE) IMPLANT
STOCKINETTE IMPERVIOUS 9X36 MD (GAUZE/BANDAGES/DRESSINGS) ×2 IMPLANT
SUCTION FRAZIER HANDLE 10FR (MISCELLANEOUS) ×1
SUCTION TUBE FRAZIER 10FR DISP (MISCELLANEOUS) ×1 IMPLANT
SUT ETHILON 2 0 FS 18 (SUTURE) IMPLANT
SUT ETHILON 2 0 PSLX (SUTURE) IMPLANT
SUT ETHILON 4 0 PS 2 18 (SUTURE) ×2 IMPLANT
SUT VIC AB 2-0 FS1 27 (SUTURE) IMPLANT
SWAB CULTURE ESWAB REG 1ML (MISCELLANEOUS) IMPLANT
SYR CONTROL 10ML LL (SYRINGE) ×2 IMPLANT
TOWEL OR 17X24 6PK STRL BLUE (TOWEL DISPOSABLE) IMPLANT
TOWEL OR 17X26 10 PK STRL BLUE (TOWEL DISPOSABLE) IMPLANT
TUBE CONNECTING 12X1/4 (SUCTIONS) ×2 IMPLANT
TUBE FEEDING ENTERAL 5FR 16IN (TUBING) IMPLANT
TUBING CYSTO DISP (UROLOGICAL SUPPLIES) IMPLANT
UNDERPAD 30X30 (UNDERPADS AND DIAPERS) ×2 IMPLANT
WATER STERILE IRR 1000ML POUR (IV SOLUTION) IMPLANT
YANKAUER SUCT BULB TIP NO VENT (SUCTIONS) ×2 IMPLANT

## 2017-05-01 NOTE — Anesthesia Preprocedure Evaluation (Addendum)
Anesthesia Evaluation  Patient identified by MRN, date of birth, ID band Patient awake    Reviewed: Allergy & Precautions, NPO status , Patient's Chart, lab work & pertinent test results  Airway Mallampati: III  TM Distance: >3 FB Neck ROM: Full    Dental no notable dental hx. (+) Teeth Intact   Pulmonary asthma , COPD,  COPD inhaler,  Hx/o multiple pulmonary nodules   Pulmonary exam normal breath sounds clear to auscultation- rhonchi       Cardiovascular Normal cardiovascular exam Rhythm:Regular Rate:Normal     Neuro/Psych negative neurological ROS  negative psych ROS   GI/Hepatic GERD  Medicated and Controlled,Portal hypertension Hepatic fibrosis Hypersplenism   Endo/Other    Renal/GU Renal InsufficiencyRenal diseaseHypokalemia  negative genitourinary   Musculoskeletal Hx/o sarcoidosis   Abdominal   Peds  Hematology  (+) anemia , Thrombocytopenia   Anesthesia Other Findings Mucocele over left DIP joint  Reproductive/Obstetrics                            Anesthesia Physical Anesthesia Plan  ASA: III  Anesthesia Plan: MAC   Post-op Pain Management:    Induction: Intravenous  PONV Risk Score and Plan: 2 and Ondansetron and Dexamethasone  Airway Management Planned: Natural Airway and Simple Face Mask  Additional Equipment:   Intra-op Plan:   Post-operative Plan:   Informed Consent: I have reviewed the patients History and Physical, chart, labs and discussed the procedure including the risks, benefits and alternatives for the proposed anesthesia with the patient or authorized representative who has indicated his/her understanding and acceptance.     Plan Discussed with: CRNA, Anesthesiologist and Surgeon  Anesthesia Plan Comments:         Anesthesia Quick Evaluation

## 2017-05-01 NOTE — H&P (Signed)
PREOPERATIVE H&P  Chief Complaint: left index finger cyst  HPI: Alicia [name] is a 56 y.o. female who presents for surgical treatment of left index finger cyst.  She denies any changes in medical history.  Past Medical History:  Diagnosis Date  . Allergy   . Anemia   . Asthma   . Cellulitis   . GERD (gastroesophageal reflux disease)   . Hepatic fibrosis    congenital  . History of UTI    chronic as a child  . Sarcoidosis   . Spleen enlarged    Past Surgical History:  Procedure Laterality Date  . COLONOSCOPY    . ESOPHAGOGASTRODUODENOSCOPY    . SPINAL FUSION  12/2007   Double spinal Fusion  . VIDEO BRONCHOSCOPY Bilateral 12/08/2015   Procedure: VIDEO BRONCHOSCOPY WITH FLUORO;  Surgeon: Rigoberto Noel, MD;  Location: [facility];  Service: Cardiopulmonary;  Laterality: Bilateral;   Social History   Social History  . Marital status: Married    Spouse name: N/A  . Number of children: N/A  . 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 Smoking status: Never Smoker  . Smokeless tobacco: Never Used  . Alcohol use No  . Drug use: No  . Sexual activity: Not on file   Other Topics Concern  . Not on file   Social History Narrative  . No narrative on file   Family History  Problem Relation Age of Onset  . Arthritis Mother   . Heart disease Father        Triple bypass  . Migraines Father   . Heart attack Paternal Grandmother   . Stroke Paternal Grandfather   . Liver disease Sister        Juvenile cysitc liver disease  . Asthma Sister    Allergies  Allergen Reactions  . Aspirin Other (See Comments)    Causes Asthma  . Peanut-Containing Drug Products Shortness Of Breath    Asthma, eye swelling, itchy eyes, can't catch her breath  . Gabapentin Other (See Comments)    Blisters in mouth, nausea   . Azithromycin Nausea And Vomiting   Prior to Admission medications   Medication Sig Start  Date End Date Taking? Authorizing Provider  albuterol (PROVENTIL HFA;VENTOLIN HFA) 108 (90 Base) MCG/ACT inhaler Inhale 1-2 puffs into the lungs every 6 (six) hours as needed for wheezing or shortness of breath. 10/19/16  Yes Palumbo, April, MD  benzonatate (TESSALON) 100 MG capsule Take 100 mg by mouth 3 (three) times daily as needed for cough.  01/30/17  Yes [provider]  DULERA 200-5 MCG/ACT AERO INHALE 2 PUFFS BY MOUTH TWO TIMES DAILY. 03/17/17  Yes Rigoberto Noel, MD  furosemide (LASIX) 40 MG tablet Take 120 mg by mouth See admin instructions. Take 120 mg daily every morning & an additional dose on Monday and Thursday (120mg ) at night   Yes [provider]  ibuprofen (ADVIL,MOTRIN) 200 MG tablet Take 400 mg by mouth every 6 (six) hours as needed for headache, mild pain or moderate pain.   Yes [provider]  ipratropium-albuterol (DUONEB) 0.5-2.5 (3) MG/3ML SOLN TAKE 3 MLS BY NEBULIZATION EVERY 4 (FOUR) HOURS AS NEEDED. 11/18/16  Yes Copland, Gay Filler, MD  lactulose (CHRONULAC) 10 GM/15ML solution Take 30 g by mouth 2 (two) times daily.  06/27/16  Yes [provider]  pantoprazole (PROTONIX) 40 MG tablet Take 1 tablet by  mouth 2 (two) times daily. Before breakfast & before supper 04/05/11  Yes [provider]  predniSONE (DELTASONE) 1 MG tablet Take 1 mg by mouth daily. 01/27/17  Yes [provider]  ranitidine (ZANTAC) 300 MG tablet TAKE 1 TABLET AT BEDTIME 12/20/16  Yes Rigoberto Noel, MD  SPIRIVA RESPIMAT 2.5 MCG/ACT AERS Inhale 2 puffs into the lungs daily at 2 PM.  01/27/17  Yes [provider]  spironolactone (ALDACTONE) 100 MG tablet Take 300 mg by mouth daily.    Yes [provider]  cephALEXin (KEFLEX) 500 MG capsule Take 1 capsule (500 mg total) by mouth 4 (four) times daily. Patient not taking: Reported on 04/11/2017 03/02/17   Barton Dubois, MD  Fluticasone Propionate, Inhal, (FLOVENT DISKUS) 100 MCG/BLIST AEPB Inhale 1  Inhaler into the lungs 2 (two) times daily. Patient not taking: Reported on 04/28/2017 10/19/16   Palumbo, April, MD  potassium chloride (K-DUR) 10 MEQ tablet Take 4 tablets (40 mEq total) by mouth 2 (two) times daily. 04/29/17   Leandrew Koyanagi, MD  potassium chloride SA (K-DUR,KLOR-CON) 20 MEQ tablet Take 3 doses of 40 meq (2 pills) over the next 36 hours.  Then recheck potassium level Patient not taking: Reported on 04/11/2017 03/05/17   Copland, Gay Filler, MD  traMADol (ULTRAM) 50 MG tablet Take 1 tablet (50 mg total) by mouth every 12 (twelve) hours as needed. Patient not taking: Reported on 04/28/2017 03/05/17   Copland, Gay Filler, MD     Positive ROS: All other systems have been reviewed and were otherwise negative with the exception of those mentioned in the HPI and as above.  Physical Exam: General: Alert, no acute distress Cardiovascular: No pedal edema Respiratory: No cyanosis, no use of accessory musculature GI: abdomen soft Skin: No lesions in the area of chief complaint Neurologic: Sensation intact distally Psychiatric: Patient is competent for consent with normal mood and affect Lymphatic: no lymphedema  MUSCULOSKELETAL: exam stable  Assessment: left index finger cyst  Plan: Plan for Procedure(s): LEFT INDEX FINGER CYST EXCISION  The risks benefits and alternatives were discussed with the patient including but not limited to the risks of nonoperative treatment, versus surgical intervention including infection, bleeding, nerve injury,  blood clots, cardiopulmonary complications, morbidity, mortality, among others, and they were willing to proceed.   Eduard Roux, MD   05/01/2017 7:34 AM

## 2017-05-01 NOTE — Transfer of Care (Signed)
Immediate Anesthesia Transfer of Care Note  Patient: Alicia [name]  Procedure(s) Performed: Procedure(s): LEFT INDEX FINGER CYST EXCISION (Left)  Patient Location: PACU  Anesthesia Type:MAC  Level of Consciousness: awake, alert  and oriented  Airway & Oxygen Therapy: Patient Spontanous Breathing  Post-op Assessment: Report given to RN, Post -op Vital signs reviewed and stable and Patient moving all extremities  Post vital signs: Reviewed and stable  Last Vitals:  Vitals:   05/01/17 1304 05/01/17 1515  BP: (!) 128/58 106/69  Pulse: 88 86  Resp: 20 11  Temp: 37.3 C 36.8 C    Last Pain:  Vitals:   05/01/17 1304  TempSrc: Oral      Patients Stated Pain Goal: 3 (85/90/93 1121)  Complications: No apparent anesthesia complications

## 2017-05-01 NOTE — Discharge Instructions (Signed)
Postoperative instructions: ° °Weightbearing instructions: as tolerated ° °Keep your dressing and/or splint clean and dry at all times.  You can remove your dressing on post-operative day #3 and change with a dry/sterile dressing or Band-Aids as needed thereafter.   ° °Incision instructions:  Do not soak your incision for 3 weeks after surgery.  If the incision gets wet, pat dry and do not scrub the incision. ° °Pain control:  You have been given a prescription to be taken as directed for post-operative pain control.  In addition, elevate the operative extremity above the heart at all times to prevent swelling and throbbing pain. ° °Take over-the-counter Colace, 100mg by mouth twice a day while taking narcotic pain medications to help prevent constipation. ° °Follow up appointments: °1) 10-14 days for suture removal and wound check. °2) Dr. Janard Culp as scheduled. ° ° ------------------------------------------------------------------------------------------------------------- ° °After Surgery Pain Control: ° °After your surgery, post-surgical discomfort or pain is likely. This discomfort can last several days to a few weeks. At certain times of the day your discomfort may be more intense.  °Did you receive a nerve block?  °A nerve block can provide pain relief for one hour to two days after your surgery. As long as the nerve block is working, you will experience little or no sensation in the area the surgeon operated on.  °As the nerve block wears off, you will begin to experience pain or discomfort. It is very important that you begin taking your prescribed pain medication before the nerve block fully wears off. Treating your pain at the first sign of the block wearing off will ensure your pain is better controlled and more tolerable when full-sensation returns. Do not wait until the pain is intolerable, as the medicine will be less effective. It is better to treat pain in advance than to try and catch up.  °General  Anesthesia:  °If you did not receive a nerve block during your surgery, you will need to start taking your pain medication shortly after your surgery and should continue to do so as prescribed by your surgeon.  °Pain Medication:  °Most commonly we prescribe Vicodin and Percocet for post-operative pain. Both of these medications contain a combination of acetaminophen (Tylenol®) and a narcotic to help control pain.  °· It takes between 30 and 45 minutes before pain medication starts to work. It is important to take your medication before your pain level gets too intense.  °· Nausea is a common side effect of many pain medications. You will want to eat something before taking your pain medicine to help prevent nausea.  °· If you are taking a prescription pain medication that contains acetaminophen, we recommend that you do not take additional over the counter acetaminophen (Tylenol®).  °Other pain relieving options:  °· Using a cold pack to ice the affected area a few times a day (15 to 20 minutes at a time) can help to relieve pain, reduce swelling and bruising.  °· Elevation of the affected area can also help to reduce pain and swelling. ° ° ° °

## 2017-05-01 NOTE — Op Note (Signed)
   Date of Surgery: 05/01/2017  INDICATIONS: Ms. Mccartney is a 56 y.o.-year-old female with a left index finger mucous cyst;  The patient did consent to the procedure after discussion of the risks and benefits.  PREOPERATIVE DIAGNOSIS: Left index finger mucous cyst, DIP joint arthritis  POSTOPERATIVE DIAGNOSIS: Same.  PROCEDURE:  1. Excision of left index finger mucous cyst 2. Arthrotomy of left index DIP joint with debridement of bone 3. Adjacent tissue rearrangement of left index finger 0.5 cm  SURGEON: N. Eduard Roux, M.D.  ASSIST: none.  ANESTHESIA:  local  IV FLUIDS AND URINE: See anesthesia.  ESTIMATED BLOOD LOSS: minimal mL.  IMPLANTS: none  DRAINS: none  COMPLICATIONS: None.  DESCRIPTION OF PROCEDURE: The patient was brought to the operating room and placed supine on the operating table.  The patient had been signed prior to the procedure and this was documented. The patient had the anesthesia placed by the anesthesiologist.  A time-out was performed to confirm that this was the correct patient, site, side and location. The patient did receive antibiotics prior to the incision and was re-dosed during the procedure as needed at indicated intervals.   The patient had the operative extremity prepped and draped in the standard surgical fashion.    A finger tourniquet was placed using a quarter inch Penrose. A digital block was placed using quarter percent Marcaine. I then made a Y shaped incision based over the dorsum of the left index finger over the DIP joint. Full-thickness flaps were elevated. The extensor tendon was identified and protected The mucous cyst was excised. The cyst was traced down to the DIP joint and an arthrotomy of the joint was performed. The dorsal osteophyte was excised using a Roger.  After this was done I then thoroughly irrigated the wound. I performed adjacent tissue rearrangement in order to fully close the wound that was left by the cyst excision. Sterile  dressings were applied. Patient tolerated procedure well and no many competitions.  POSTOPERATIVE PLAN: Patient will be discharged home.  Azucena Cecil, MD [place] 3:04 PM

## 2017-05-01 NOTE — Anesthesia Postprocedure Evaluation (Signed)
Anesthesia Post Note  Patient: Alicia [name]  Procedure(s) Performed: Procedure(s) (LRB): LEFT INDEX FINGER CYST EXCISION (Left)     Patient location during evaluation: PACU Anesthesia Type: MAC Level of consciousness: awake and alert and oriented Pain management: pain level controlled Vital Signs Assessment: post-procedure vital signs reviewed and stable Respiratory status: spontaneous breathing, nonlabored ventilation and respiratory function stable Cardiovascular status: stable and blood pressure returned to baseline Postop Assessment: no signs of nausea or vomiting Anesthetic complications: no    Last Vitals:  Vitals:   05/01/17 1304 05/01/17 1515  BP: (!) 128/58 106/69  Pulse: 88 86  Resp: 20 11  Temp: 37.3 C 36.8 C    Last Pain:  Vitals:   05/01/17 1304  TempSrc: Oral                 Amari Burnsworth A.

## 2017-05-01 NOTE — OR Nursing (Signed)
Patient received a skin tear to left upper arm from the removal of the surgical drapes.  Surgeon was notified and the wound was covered with xeroform, gauze, kerlix, and coban.

## 2017-05-02 ENCOUNTER — Encounter (HOSPITAL_COMMUNITY): Payer: Self-pay | Admitting: Orthopaedic Surgery

## 2017-05-04 ENCOUNTER — Emergency Department (HOSPITAL_BASED_OUTPATIENT_CLINIC_OR_DEPARTMENT_OTHER)
Admission: EM | Admit: 2017-05-04 | Discharge: 2017-05-05 | Disposition: A | Discharge status: Home / Self Care | Payer: BLUE CROSS/BLUE SHIELD | Attending: Emergency Medicine | Admitting: Emergency Medicine

## 2017-05-04 ENCOUNTER — Encounter (HOSPITAL_BASED_OUTPATIENT_CLINIC_OR_DEPARTMENT_OTHER): Payer: Self-pay | Admitting: Emergency Medicine

## 2017-05-04 DIAGNOSIS — L03116 Cellulitis of left lower limb: Secondary | ICD-10-CM

## 2017-05-04 DIAGNOSIS — N183 Chronic kidney disease, stage 3 (moderate): Secondary | ICD-10-CM | POA: Diagnosis not present

## 2017-05-04 DIAGNOSIS — I129 Hypertensive chronic kidney disease with stage 1 through stage 4 chronic kidney disease, or unspecified chronic kidney disease: Secondary | ICD-10-CM | POA: Insufficient documentation

## 2017-05-04 DIAGNOSIS — W458XXA Other foreign body or object entering through skin, initial encounter: Secondary | ICD-10-CM | POA: Insufficient documentation

## 2017-05-04 DIAGNOSIS — Y999 Unspecified external cause status: Secondary | ICD-10-CM | POA: Diagnosis not present

## 2017-05-04 DIAGNOSIS — Z79899 Other long term (current) drug therapy: Secondary | ICD-10-CM | POA: Diagnosis not present

## 2017-05-04 DIAGNOSIS — J45909 Unspecified asthma, uncomplicated: Secondary | ICD-10-CM | POA: Diagnosis not present

## 2017-05-04 DIAGNOSIS — S81812A Laceration without foreign body, left lower leg, initial encounter: Secondary | ICD-10-CM

## 2017-05-04 DIAGNOSIS — Y929 Unspecified place or not applicable: Secondary | ICD-10-CM | POA: Insufficient documentation

## 2017-05-04 DIAGNOSIS — Y939 Activity, unspecified: Secondary | ICD-10-CM | POA: Diagnosis not present

## 2017-05-04 DIAGNOSIS — Z91010 Allergy to peanuts: Secondary | ICD-10-CM | POA: Diagnosis not present

## 2017-05-04 DIAGNOSIS — E876 Hypokalemia: Secondary | ICD-10-CM

## 2017-05-04 MED ORDER — CEPHALEXIN 250 MG PO CAPS
500.0000 mg | ORAL_CAPSULE | Freq: Once | ORAL | Status: AC
  Administered 2017-05-04: 500 mg via ORAL
  Filled 2017-05-04: qty 2

## 2017-05-04 MED ORDER — CEPHALEXIN 500 MG PO CAPS: 500.0000 mg | ORAL_CAPSULE | Freq: Four times a day (QID) | ORAL | 0 refills | Status: DC

## 2017-05-04 NOTE — ED Triage Notes (Signed)
Patient reports that she has very fragile skin. She brushed up against a wicker basket and cut her left leg - patient has liver disease

## 2017-05-04 NOTE — ED Notes (Signed)
EDP into room, prior to RN assessment, see MD notes, pending orders.   

## 2017-05-04 NOTE — ED Provider Notes (Signed)
[facility] DEPT [facility] Provider Note   CSN: [id number] Arrival date & time: 05/04/17  1943  By signing my name below, I, [name], attest that this documentation has been prepared under the direction and in the presence of physician practitioner, Delora Fuel, MD. Electronically Signed: [name], Scribe. 05/04/2017. 11:20 PM.  History   Chief Complaint Chief Complaint  Patient presents with  . Laceration   The history is provided by the patient and the spouse. No language interpreter was used.    HPI Comments: Alicia [name] is a 56 y.o. female with PMHx including sarcoidosis, hepatic fibrosis, thrombocytopenia, and CKD who presents to the Emergency Department for evaluation of an anterior left lower leg laceration sustained earlier tonight. Patient reports that she has "fragile" skin and bleeds easily due to thrombocytopenia. She "brushed up" against a wicker basket earlier tonight and lacerated the anterior left lower leg. She wrapped the wound prior to arrival which has slowed the bleeding. Patient endorses stinging pain secondary to the wound. She is on daily diuretics. Her tetanus status is UTD. Of note, she is on a list at [facility] to receive a liver transplant. She denies numbness/tingling, focal weakness, or any other associated symptoms.  Past Medical History:  Diagnosis Date  . Allergy   . Anemia   . Asthma   . Cellulitis   . GERD (gastroesophageal reflux disease)   . Hepatic fibrosis    congenital  . History of UTI    chronic as a child  . Sarcoidosis   . Spleen enlarged     Patient Active Problem List   Diagnosis Date Noted  . Mucous cyst of digit of left hand 04/11/2017  . Cellulitis, abdominal wall 03/01/2017  . Normocytic anemia 03/01/2017  . Hypokalemia 03/01/2017  . CKD (chronic kidney disease), stage III 03/01/2017  . Abdominal wall cellulitis 03/01/2017  . Adrenal insufficiency ([place]) 03/01/2017  . Hyperammonemia ([place])   . Cellulitis 06/28/2016    . Thrombocytopenia ([place]) 06/28/2016  . AKI (acute kidney injury) ([place]) 06/28/2016  . Multiple pulmonary nodules determined by computed tomography of lung   . Chronic cough 09/07/2015  . Hypertension, portal ([employer]) 09/04/2011  . Asthma with chronic obstructive pulmonary disease (COPD) ([place]) 09/04/2011  . 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 GERD (gastroesophageal reflux disease) 07/04/2011  . Sarcoid ([place]) 07/01/2011  . Asthma, severe persistent, with upper airway instability 06/13/2011  . 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 COLONOSCOPY    . CYST EXCISION Left 05/01/2017   Procedure: LEFT INDEX FINGER CYST EXCISION;  Surgeon: Leandrew Koyanagi, MD;  Location: [facility];  Service: Orthopedics;  Laterality: Left;  . ESOPHAGOGASTRODUODENOSCOPY    . SPINAL FUSION  12/2007   Double spinal Fusion  . VIDEO BRONCHOSCOPY Bilateral 12/08/2015   Procedure: VIDEO BRONCHOSCOPY WITH FLUORO;  Surgeon: Rigoberto Noel, MD;  Location: [facility];  Service: Cardiopulmonary;  Laterality: Bilateral;    OB History    No data available       Home Medications    Prior to Admission medications   Medication Sig Start Date End Date Taking? Authorizing Provider  albuterol (PROVENTIL HFA;VENTOLIN HFA) 108 (90 Base) MCG/ACT inhaler Inhale 1-2 puffs into the lungs every 6 (six) hours as needed for wheezing or shortness of breath. 10/19/16   Palumbo, April, MD  benzonatate (TESSALON) 100 MG capsule Take 100 mg by mouth 3 (three) times daily as needed for cough.  01/30/17  [provider]  cephALEXin (KEFLEX) 500 MG capsule Take 1 capsule (500 mg total) by mouth 4 (four) times daily. Patient not taking: Reported on 04/11/2017 03/02/17   Barton Dubois, MD  DULERA 200-5 MCG/ACT AERO INHALE 2 PUFFS BY MOUTH TWO TIMES DAILY. 03/17/17   Rigoberto Noel, MD  Fluticasone Propionate, Inhal, (FLOVENT DISKUS) 100 MCG/BLIST AEPB Inhale 1 Inhaler into the lungs 2 (two) times  daily. Patient not taking: Reported on 04/28/2017 10/19/16   Palumbo, April, MD  furosemide (LASIX) 40 MG tablet Take 120 mg by mouth See admin instructions. Take 120 mg daily every morning & an additional dose on Monday and Thursday (120mg ) at night    [provider]  ibuprofen (ADVIL,MOTRIN) 200 MG tablet Take 400 mg by mouth every 6 (six) hours as needed for headache, mild pain or moderate pain.    [provider]  ipratropium-albuterol (DUONEB) 0.5-2.5 (3) MG/3ML SOLN TAKE 3 MLS BY NEBULIZATION EVERY 4 (FOUR) HOURS AS NEEDED. 11/18/16   Copland, Gay Filler, MD  lactulose (CHRONULAC) 10 GM/15ML solution Take 30 g by mouth 2 (two) times daily.  06/27/16   [provider]  oxyCODONE (OXY IR/ROXICODONE) 5 MG immediate release tablet Take 1 tablet (5 mg total) by mouth every 4 (four) hours as needed. 05/01/17   Leandrew Koyanagi, MD  pantoprazole (PROTONIX) 40 MG tablet Take 1 tablet by mouth 2 (two) times daily. Before breakfast & before supper 04/05/11   [provider]  potassium chloride (K-DUR) 10 MEQ tablet Take 4 tablets (40 mEq total) by mouth 2 (two) times daily. 04/29/17   Leandrew Koyanagi, MD  potassium chloride SA (K-DUR,KLOR-CON) 20 MEQ tablet Take 3 doses of 40 meq (2 pills) over the next 36 hours.  Then recheck potassium level Patient not taking: Reported on 04/11/2017 03/05/17   Copland, Gay Filler, MD  predniSONE (DELTASONE) 1 MG tablet Take 1 mg by mouth daily. 01/27/17   [provider]  ranitidine (ZANTAC) 300 MG tablet TAKE 1 TABLET AT BEDTIME 12/20/16   Rigoberto Noel, MD  SPIRIVA RESPIMAT 2.5 MCG/ACT AERS Inhale 2 puffs into the lungs daily at 2 PM.  01/27/17   [provider]  spironolactone (ALDACTONE) 100 MG tablet Take 300 mg by mouth daily.     [provider]  traMADol (ULTRAM) 50 MG tablet Take 1 tablet (50 mg total) by mouth every 12 (twelve) hours as needed. Patient not taking: Reported on 04/28/2017 03/05/17   Copland, Gay Filler, MD     Family History Family History  Problem Relation Age of Onset  . Arthritis Mother   . Heart disease Father        Triple bypass  . Migraines Father   . Heart attack Paternal Grandmother   . Stroke Paternal Grandfather   . Liver disease Sister        Juvenile cysitc liver disease  . Asthma Sister     Social History Social History  Substance Use Topics  . Smoking status: Never Smoker  . Smokeless tobacco: Never Used  . Alcohol use No     Allergies   Aspirin; Peanut-containing drug products; Gabapentin; and Azithromycin   Review of Systems Review of Systems  All other systems reviewed and are negative.  Physical Exam Updated Vital Signs BP 133/83 (BP Location: Right Arm)   Pulse 91   Temp 98.5 F (36.9 C) (Oral)   Resp 18   Ht 5\' 2"  (1.575 m)   Wt 147  lb (66.7 kg)   SpO2 100%   BMI 26.89 kg/m   Physical Exam  Constitutional: She is oriented to person, place, and time. She appears well-developed and well-nourished.  HENT:  Head: Normocephalic and atraumatic.  Eyes: Pupils are equal, round, and reactive to light. EOM are normal.  Neck: Normal range of motion. Neck supple. No JVD present.  Cardiovascular: Normal rate, regular rhythm and normal heart sounds.   No murmur heard. Pulmonary/Chest: Effort normal and breath sounds normal. She has no wheezes. She has no rales. She exhibits no tenderness.  Abdominal: Soft. Bowel sounds are normal. She exhibits no distension and no mass. There is no tenderness.  Musculoskeletal: Normal range of motion. She exhibits edema.  2-3+ pretibial and pedal edema. 7.5 cm skin tear left anterior lower leg.  Lymphadenopathy:    She has no cervical adenopathy.  Neurological: She is alert and oriented to person, place, and time. No cranial nerve deficit. She exhibits normal muscle tone. Coordination normal.  Skin: Skin is warm and dry. No rash noted.  Psychiatric: She has a normal mood and affect. Her behavior is normal. Judgment  and thought content normal.  Nursing note and vitals reviewed.  ED Treatments / Results   Procedures Procedures (including critical care time)  LACERATION REPAIR Performed by: FIEPP,IRJJO Authorized by: ACZYS,AYTKZ Consent: Verbal consent obtained. Risks and benefits: risks, benefits and alternatives were discussed Consent given by: patient Patient identity confirmed: provided demographic data Prepped and Draped in normal sterile fashion Wound explored  Laceration Location: Left lower leg  Laceration Length: 7.5 cm  No Foreign Bodies seen or palpated  Anesthesia: None   Amount of cleaning: standard  Skin closure: Close   Technique: Tissue adhesive   Patient tolerance: Patient tolerated the procedure well with no immediate complications.   DIAGNOSTIC STUDIES: Oxygen Saturation is 100% on RA, normal by my interpretation.    COORDINATION OF CARE: 11:20 PM Discussed treatment plan with pt at bedside and pt agreed to plan.  Medications Ordered in ED Medications  cephALEXin (KEFLEX) capsule 500 mg (500 mg Oral Given 05/04/17 2343)     Initial Impression / Assessment and Plan / ED Course  I have reviewed the triage vital signs and the nursing notes.  Skin tear of left lower leg. There is some gaping, and patient expresses extreme tendency towards cellulitis. It is elected to close this with tissue adhesive as being the most likely way to close it without increasing risk of cellulitis. She is given dose of cephalexin in the ED and sent home with prescription for cephalexin. Advised to return if signs of cellulitis develop.  Final Clinical Impressions(s) / ED Diagnoses   Final diagnoses:  Skin tear of left lower leg without complication, initial encounter    New Prescriptions Current Discharge Medication List     I personally performed the services described in this documentation, which was scribed in my presence. The recorded information has been reviewed and is  accurate.      Delora Fuel, MD 60/10/93 [phone]

## 2017-05-04 NOTE — Discharge Instructions (Signed)
Return if any signs of infection develop.

## 2017-05-07 ENCOUNTER — Encounter (INDEPENDENT_AMBULATORY_CARE_PROVIDER_SITE_OTHER): Payer: Self-pay | Admitting: Orthopaedic Surgery

## 2017-05-08 ENCOUNTER — Encounter: Payer: Self-pay | Admitting: Family Medicine

## 2017-05-08 ENCOUNTER — Ambulatory Visit (INDEPENDENT_AMBULATORY_CARE_PROVIDER_SITE_OTHER): Payer: BLUE CROSS/BLUE SHIELD | Admitting: Family Medicine

## 2017-05-08 VITALS — BP 120/60 | HR 94 | Temp 98.7°F | Ht 63.0 in | Wt 151.4 lb

## 2017-05-08 DIAGNOSIS — A084 Viral intestinal infection, unspecified: Secondary | ICD-10-CM | POA: Diagnosis not present

## 2017-05-08 MED ORDER — ONDANSETRON HCL 4 MG PO TABS: 4.0000 mg | ORAL_TABLET | Freq: Two times a day (BID) | ORAL | 0 refills | Status: DC | PRN

## 2017-05-08 MED ORDER — DICYCLOMINE HCL 10 MG PO CAPS: 10.0000 mg | ORAL_CAPSULE | Freq: Three times a day (TID) | ORAL | 0 refills | Status: DC

## 2017-05-08 NOTE — Progress Notes (Signed)
Chief Complaint  Patient presents with  . Stomach issues    nausea and diarrhea-sxs started on Mon  . Follow-up    on cut on (L) lower leg-pt was seen in the ED on Sun night     Subjective Alicia [name] is a 56 y.o. female who presents with vomiting and diarrhea Symptoms began 3 d ago Patient has cramping, diarrhea and nausea Patient denies vomiting and red eyes Evaluation to date: no Sick contacts: Did go to ER one day before symptoms started for an unrelated issue  Past Medical History:  Diagnosis Date  . Allergy   . Anemia   . Asthma   . Cellulitis   . GERD (gastroesophageal reflux disease)   . Hepatic fibrosis    congenital  . History of UTI    chronic as a child  . Sarcoidosis   . Spleen enlarged    Past Surgical History:  Procedure Laterality Date  . COLONOSCOPY    . CYST EXCISION Left 05/01/2017   Procedure: LEFT INDEX FINGER CYST EXCISION;  Surgeon: Leandrew Koyanagi, MD;  Location: [facility];  Service: Orthopedics;  Laterality: Left;  . ESOPHAGOGASTRODUODENOSCOPY    . SPINAL FUSION  12/2007   Double spinal Fusion  . VIDEO BRONCHOSCOPY Bilateral 12/08/2015   Procedure: VIDEO BRONCHOSCOPY WITH FLUORO;  Surgeon: Rigoberto Noel, MD;  Location: [facility];  Service: Cardiopulmonary;  Laterality: Bilateral;   Current Outpatient Prescriptions on File Prior to Visit  Medication Sig Dispense Refill  . albuterol (PROVENTIL HFA;VENTOLIN HFA) 108 (90 Base) MCG/ACT inhaler Inhale 1-2 puffs into the lungs every 6 (six) hours as needed for wheezing or shortness of breath. 1 Inhaler 0  . benzonatate (TESSALON) 100 MG capsule Take 100 mg by mouth 3 (three) times daily as needed for cough.   2  . cephALEXin (KEFLEX) 500 MG capsule Take 1 capsule (500 mg total) by mouth 4 (four) times daily. 20 capsule 0  . DULERA 200-5 MCG/ACT AERO INHALE 2 PUFFS BY MOUTH TWO TIMES DAILY. 13 Inhaler 0  . furosemide (LASIX) 40 MG tablet Take 120 mg by mouth See admin instructions. Take 120 mg daily every  morning & an additional dose on Monday and Thursday (120mg ) at night    . ibuprofen (ADVIL,MOTRIN) 200 MG tablet Take 400 mg by mouth every 6 (six) hours as needed for headache, mild pain or moderate pain.    Marland Kitchen ipratropium-albuterol (DUONEB) 0.5-2.5 (3) MG/3ML SOLN TAKE 3 MLS BY NEBULIZATION EVERY 4 (FOUR) HOURS AS NEEDED. 360 mL 0  . lactulose (CHRONULAC) 10 GM/15ML solution Take 30 g by mouth 2 (two) times daily.     Marland Kitchen oxyCODONE (OXY IR/ROXICODONE) 5 MG immediate release tablet Take 1 tablet (5 mg total) by mouth every 4 (four) hours as needed. 30 tablet 0  . pantoprazole (PROTONIX) 40 MG tablet Take 1 tablet by mouth 2 (two) times daily. Before breakfast & before supper    . potassium chloride (K-DUR) 10 MEQ tablet Take 4 tablets (40 mEq total) by mouth 2 (two) times daily. 6 tablet 0  . predniSONE (DELTASONE) 1 MG tablet Take 1 mg by mouth every other day.   0  . ranitidine (ZANTAC) 300 MG tablet TAKE 1 TABLET AT BEDTIME 90 tablet 1  . SPIRIVA RESPIMAT 2.5 MCG/ACT AERS Inhale 2 puffs into the lungs daily at 2 PM.   11  . spironolactone (ALDACTONE) 100 MG tablet Take 300 mg by mouth daily.      No current facility-administered  medications on file prior to visit.    Allergies  Allergen Reactions  . Aspirin Other (See Comments)    Causes Asthma  . Peanut-Containing Drug Products Shortness Of Breath    Asthma, eye swelling, itchy eyes, can't catch her breath  . Gabapentin Other (See Comments)    Blisters in mouth, nausea   . Azithromycin Nausea And Vomiting    Review of Systems Constitutional:  No fevers or chills Ear/Nose/Mouth/Throat:  No red eyes Gastrointestinal:  As noted in the HPI Musculoskeletal/Extremities: no myalgias Integumentary (Skin/Breast): no rash  Exam BP 120/60 (BP Location: Right Arm, Patient Position: Sitting, Cuff Size: Normal)   Pulse 94   Temp 98.7 F (37.1 C) (Oral)   Ht 5\' 3"  (1.6 m)   Wt 151 lb 6.4 oz (68.7 kg)   SpO2 98%   BMI 26.82 kg/m  General:   well developed, well hydrated, in no apparent distress Skin:  Well healing abrasion on LLE; angiomas throughout Throat/Pharynx:  lips and gingiva without lesion; tongue and uvula midline; non-inflamed pharynx; no exudates or postnasal drainage Neck: neck supple without adenopathy, thyromegaly, or masses Lungs:  clear to auscultation, breath sounds equal bilaterally, no respiratory distress, no wheezes Cardio:  regular rate and rhythm, 3+ pitting edema b/l Abdomen:  abdomen soft, diffuse TTP; bowel sounds normal; no masses or organomegaly Psych: Appropriate judgement/insight Assessment and Plan  Viral gastroenteritis - Plan: ondansetron (ZOFRAN) 4 MG tablet, dicyclomine (BENTYL) 10 MG capsule  Orders as above. Avoid aggravating foods. Stay well hydrated- replace electrolytes with Gatorade if still having diarrhea.  F/u if symptoms fail to improve, sooner if worsening. The patient voiced understanding and agreement to the plan.  [place], DO 05/08/17  1:13 PM

## 2017-05-08 NOTE — Patient Instructions (Signed)
Stay well hydrated.  Don't take Zofran more than 2 times in a day.  Keep washing your hands.  Let us know if you need anything.

## 2017-05-09 ENCOUNTER — Encounter (HOSPITAL_BASED_OUTPATIENT_CLINIC_OR_DEPARTMENT_OTHER): Payer: Self-pay | Admitting: *Deleted

## 2017-05-09 ENCOUNTER — Inpatient Hospital Stay (HOSPITAL_BASED_OUTPATIENT_CLINIC_OR_DEPARTMENT_OTHER)
Admission: EM | Admit: 2017-05-09 | Discharge: 2017-05-15 | DRG: 603 | Disposition: A | Discharge status: Home / Self Care | Payer: BLUE CROSS/BLUE SHIELD | Attending: Internal Medicine | Admitting: Internal Medicine

## 2017-05-09 DIAGNOSIS — Z79899 Other long term (current) drug therapy: Secondary | ICD-10-CM

## 2017-05-09 DIAGNOSIS — E2740 Unspecified adrenocortical insufficiency: Secondary | ICD-10-CM | POA: Diagnosis present

## 2017-05-09 DIAGNOSIS — Z881 Allergy status to other antibiotic agents status: Secondary | ICD-10-CM

## 2017-05-09 DIAGNOSIS — Z886 Allergy status to analgesic agent status: Secondary | ICD-10-CM

## 2017-05-09 DIAGNOSIS — X58XXXA Exposure to other specified factors, initial encounter: Secondary | ICD-10-CM | POA: Diagnosis present

## 2017-05-09 DIAGNOSIS — K766 Portal hypertension: Secondary | ICD-10-CM | POA: Diagnosis present

## 2017-05-09 DIAGNOSIS — D696 Thrombocytopenia, unspecified: Secondary | ICD-10-CM | POA: Diagnosis present

## 2017-05-09 DIAGNOSIS — K219 Gastro-esophageal reflux disease without esophagitis: Secondary | ICD-10-CM | POA: Diagnosis present

## 2017-05-09 DIAGNOSIS — Z91010 Allergy to peanuts: Secondary | ICD-10-CM

## 2017-05-09 DIAGNOSIS — J45909 Unspecified asthma, uncomplicated: Secondary | ICD-10-CM | POA: Diagnosis present

## 2017-05-09 DIAGNOSIS — K740 Hepatic fibrosis: Secondary | ICD-10-CM | POA: Diagnosis present

## 2017-05-09 DIAGNOSIS — Z820 Family history of epilepsy and other diseases of the nervous system: Secondary | ICD-10-CM

## 2017-05-09 DIAGNOSIS — L03116 Cellulitis of left lower limb: Principal | ICD-10-CM | POA: Diagnosis present

## 2017-05-09 DIAGNOSIS — K7290 Hepatic failure, unspecified without coma: Secondary | ICD-10-CM | POA: Diagnosis present

## 2017-05-09 DIAGNOSIS — Z791 Long term (current) use of non-steroidal anti-inflammatories (NSAID): Secondary | ICD-10-CM

## 2017-05-09 DIAGNOSIS — D899 Disorder involving the immune mechanism, unspecified: Secondary | ICD-10-CM

## 2017-05-09 DIAGNOSIS — Z7682 Awaiting organ transplant status: Secondary | ICD-10-CM

## 2017-05-09 DIAGNOSIS — Z8379 Family history of other diseases of the digestive system: Secondary | ICD-10-CM

## 2017-05-09 DIAGNOSIS — Z8261 Family history of arthritis: Secondary | ICD-10-CM

## 2017-05-09 DIAGNOSIS — R197 Diarrhea, unspecified: Secondary | ICD-10-CM | POA: Diagnosis present

## 2017-05-09 DIAGNOSIS — K7469 Other cirrhosis of liver: Secondary | ICD-10-CM

## 2017-05-09 DIAGNOSIS — S81812A Laceration without foreign body, left lower leg, initial encounter: Secondary | ICD-10-CM | POA: Diagnosis present

## 2017-05-09 DIAGNOSIS — L0390 Cellulitis, unspecified: Secondary | ICD-10-CM | POA: Diagnosis present

## 2017-05-09 DIAGNOSIS — Z7952 Long term (current) use of systemic steroids: Secondary | ICD-10-CM

## 2017-05-09 DIAGNOSIS — E8770 Fluid overload, unspecified: Secondary | ICD-10-CM | POA: Diagnosis present

## 2017-05-09 DIAGNOSIS — D860 Sarcoidosis of lung: Secondary | ICD-10-CM | POA: Diagnosis present

## 2017-05-09 DIAGNOSIS — Z8249 Family history of ischemic heart disease and other diseases of the circulatory system: Secondary | ICD-10-CM

## 2017-05-09 DIAGNOSIS — Z87440 Personal history of urinary (tract) infections: Secondary | ICD-10-CM

## 2017-05-09 DIAGNOSIS — Z823 Family history of stroke: Secondary | ICD-10-CM

## 2017-05-09 DIAGNOSIS — Z79891 Long term (current) use of opiate analgesic: Secondary | ICD-10-CM

## 2017-05-09 DIAGNOSIS — N183 Chronic kidney disease, stage 3 unspecified: Secondary | ICD-10-CM | POA: Diagnosis present

## 2017-05-09 DIAGNOSIS — Z825 Family history of asthma and other chronic lower respiratory diseases: Secondary | ICD-10-CM

## 2017-05-09 DIAGNOSIS — Z981 Arthrodesis status: Secondary | ICD-10-CM

## 2017-05-09 DIAGNOSIS — D849 Immunodeficiency, unspecified: Secondary | ICD-10-CM | POA: Diagnosis present

## 2017-05-09 DIAGNOSIS — D631 Anemia in chronic kidney disease: Secondary | ICD-10-CM | POA: Diagnosis present

## 2017-05-09 DIAGNOSIS — Z888 Allergy status to other drugs, medicaments and biological substances status: Secondary | ICD-10-CM

## 2017-05-09 DIAGNOSIS — D869 Sarcoidosis, unspecified: Secondary | ICD-10-CM | POA: Diagnosis present

## 2017-05-09 DIAGNOSIS — K5900 Constipation, unspecified: Secondary | ICD-10-CM | POA: Diagnosis present

## 2017-05-09 NOTE — ED Triage Notes (Signed)
Pt with laceration repair to left calf 5 days ago, streaking all the way up to thigh.  Bilateral edema to feet noted. Pt awaiting a liver transplant at [facility]

## 2017-05-10 ENCOUNTER — Emergency Department (HOSPITAL_BASED_OUTPATIENT_CLINIC_OR_DEPARTMENT_OTHER): Payer: BLUE CROSS/BLUE SHIELD

## 2017-05-10 DIAGNOSIS — E8770 Fluid overload, unspecified: Secondary | ICD-10-CM | POA: Diagnosis present

## 2017-05-10 DIAGNOSIS — K219 Gastro-esophageal reflux disease without esophagitis: Secondary | ICD-10-CM | POA: Diagnosis present

## 2017-05-10 DIAGNOSIS — K740 Hepatic fibrosis: Secondary | ICD-10-CM | POA: Diagnosis present

## 2017-05-10 DIAGNOSIS — Z91010 Allergy to peanuts: Secondary | ICD-10-CM | POA: Diagnosis not present

## 2017-05-10 DIAGNOSIS — D849 Immunodeficiency, unspecified: Secondary | ICD-10-CM | POA: Diagnosis not present

## 2017-05-10 DIAGNOSIS — K5900 Constipation, unspecified: Secondary | ICD-10-CM | POA: Diagnosis present

## 2017-05-10 DIAGNOSIS — K7469 Other cirrhosis of liver: Secondary | ICD-10-CM

## 2017-05-10 DIAGNOSIS — D696 Thrombocytopenia, unspecified: Secondary | ICD-10-CM | POA: Diagnosis present

## 2017-05-10 DIAGNOSIS — Z981 Arthrodesis status: Secondary | ICD-10-CM | POA: Diagnosis not present

## 2017-05-10 DIAGNOSIS — N183 Chronic kidney disease, stage 3 (moderate): Secondary | ICD-10-CM | POA: Diagnosis present

## 2017-05-10 DIAGNOSIS — Z888 Allergy status to other drugs, medicaments and biological substances status: Secondary | ICD-10-CM | POA: Diagnosis not present

## 2017-05-10 DIAGNOSIS — Z8261 Family history of arthritis: Secondary | ICD-10-CM | POA: Diagnosis not present

## 2017-05-10 DIAGNOSIS — Z886 Allergy status to analgesic agent status: Secondary | ICD-10-CM | POA: Diagnosis not present

## 2017-05-10 DIAGNOSIS — D899 Disorder involving the immune mechanism, unspecified: Secondary | ICD-10-CM

## 2017-05-10 DIAGNOSIS — Z87440 Personal history of urinary (tract) infections: Secondary | ICD-10-CM | POA: Diagnosis not present

## 2017-05-10 DIAGNOSIS — E2740 Unspecified adrenocortical insufficiency: Secondary | ICD-10-CM | POA: Diagnosis present

## 2017-05-10 DIAGNOSIS — Z7682 Awaiting organ transplant status: Secondary | ICD-10-CM | POA: Diagnosis not present

## 2017-05-10 DIAGNOSIS — Z79891 Long term (current) use of opiate analgesic: Secondary | ICD-10-CM | POA: Diagnosis not present

## 2017-05-10 DIAGNOSIS — K766 Portal hypertension: Secondary | ICD-10-CM | POA: Diagnosis present

## 2017-05-10 DIAGNOSIS — Z79899 Other long term (current) drug therapy: Secondary | ICD-10-CM | POA: Diagnosis not present

## 2017-05-10 DIAGNOSIS — J45909 Unspecified asthma, uncomplicated: Secondary | ICD-10-CM | POA: Diagnosis present

## 2017-05-10 DIAGNOSIS — R197 Diarrhea, unspecified: Secondary | ICD-10-CM | POA: Diagnosis present

## 2017-05-10 DIAGNOSIS — D869 Sarcoidosis, unspecified: Secondary | ICD-10-CM

## 2017-05-10 DIAGNOSIS — L03116 Cellulitis of left lower limb: Secondary | ICD-10-CM | POA: Diagnosis present

## 2017-05-10 DIAGNOSIS — X58XXXA Exposure to other specified factors, initial encounter: Secondary | ICD-10-CM | POA: Diagnosis present

## 2017-05-10 DIAGNOSIS — Z791 Long term (current) use of non-steroidal anti-inflammatories (NSAID): Secondary | ICD-10-CM | POA: Diagnosis not present

## 2017-05-10 DIAGNOSIS — D860 Sarcoidosis of lung: Secondary | ICD-10-CM | POA: Diagnosis present

## 2017-05-10 DIAGNOSIS — Z881 Allergy status to other antibiotic agents status: Secondary | ICD-10-CM | POA: Diagnosis not present

## 2017-05-10 LAB — COMPREHENSIVE METABOLIC PANEL
ALT: 35 U/L (ref 14–54)
ANION GAP: 9 (ref 5–15)
AST: 56 U/L — AB (ref 15–41)
Albumin: 2.6 g/dL — ABNORMAL LOW (ref 3.5–5.0)
Alkaline Phosphatase: 183 U/L — ABNORMAL HIGH (ref 38–126)
BILIRUBIN TOTAL: 3.9 mg/dL — AB (ref 0.3–1.2)
BUN: 13 mg/dL (ref 6–20)
CHLORIDE: 101 mmol/L (ref 101–111)
CO2: 24 mmol/L (ref 22–32)
Calcium: 7.8 mg/dL — ABNORMAL LOW (ref 8.9–10.3)
Creatinine, Ser: 1.11 mg/dL — ABNORMAL HIGH (ref 0.44–1.00)
GFR calc Af Amer: >60 mL/min (ref >60)
GFR, EST NON AFRICAN AMERICAN: 54 mL/min — AB (ref >60)
Glucose, Bld: 144 mg/dL — ABNORMAL HIGH (ref 65–99)
POTASSIUM: 3.8 mmol/L (ref 3.5–5.1)
Sodium: 134 mmol/L — ABNORMAL LOW (ref 135–145)
TOTAL PROTEIN: 5.5 g/dL — AB (ref 6.5–8.1)

## 2017-05-10 LAB — CBC WITH DIFFERENTIAL/PLATELET
BASOS ABS: 0 10*3/uL (ref 0.0–0.1)
Basophils Relative: 0 %
EOS ABS: 0 10*3/uL (ref 0.0–0.7)
EOS PCT: 0 %
HEMATOCRIT: 31.1 % — AB (ref 36.0–46.0)
Hemoglobin: 10.7 g/dL — ABNORMAL LOW (ref 12.0–15.0)
Lymphocytes Relative: 8 %
Lymphs Abs: 0.9 10*3/uL (ref 0.7–4.0)
MCH: 34.9 pg — ABNORMAL HIGH (ref 26.0–34.0)
MCHC: 34.4 g/dL (ref 30.0–36.0)
MCV: 101.3 fL — AB (ref 78.0–100.0)
Monocytes Absolute: 1.2 10*3/uL — ABNORMAL HIGH (ref 0.1–1.0)
Monocytes Relative: 10 %
NEUTROS PCT: 82 %
Neutro Abs: 10.2 10*3/uL — ABNORMAL HIGH (ref 1.7–7.7)
PLATELETS: 94 10*3/uL — AB (ref 150–400)
RBC: 3.07 MIL/uL — AB (ref 3.87–5.11)
RDW: 15.2 % (ref 11.5–15.5)
WBC: 12.3 10*3/uL — AB (ref 4.0–10.5)

## 2017-05-10 MED ORDER — BENZONATATE 100 MG PO CAPS
100.0000 mg | ORAL_CAPSULE | Freq: Three times a day (TID) | ORAL | Status: DC | PRN
  Administered 2017-05-11: 100 mg via ORAL
  Filled 2017-05-10: qty 1

## 2017-05-10 MED ORDER — VANCOMYCIN HCL IN DEXTROSE 1-5 GM/200ML-% IV SOLN
1000.0000 mg | Freq: Once | INTRAVENOUS | Status: AC
  Administered 2017-05-10: 1000 mg via INTRAVENOUS
  Filled 2017-05-10: qty 200

## 2017-05-10 MED ORDER — ONDANSETRON HCL 4 MG/2ML IJ SOLN
4.0000 mg | Freq: Once | INTRAMUSCULAR | Status: AC
  Administered 2017-05-10: 4 mg via INTRAVENOUS
  Filled 2017-05-10: qty 2

## 2017-05-10 MED ORDER — OXYCODONE HCL 5 MG PO TABS
5.0000 mg | ORAL_TABLET | ORAL | Status: DC | PRN
  Administered 2017-05-10 – 2017-05-13 (×5): 5 mg via ORAL
  Filled 2017-05-10 (×5): qty 1

## 2017-05-10 MED ORDER — PANTOPRAZOLE SODIUM 40 MG PO TBEC
40.0000 mg | DELAYED_RELEASE_TABLET | Freq: Two times a day (BID) | ORAL | Status: DC
  Administered 2017-05-10 – 2017-05-15 (×11): 40 mg via ORAL
  Filled 2017-05-10 (×11): qty 1

## 2017-05-10 MED ORDER — POTASSIUM CHLORIDE ER 10 MEQ PO TBCR
40.0000 meq | EXTENDED_RELEASE_TABLET | Freq: Two times a day (BID) | ORAL | Status: DC
  Administered 2017-05-10 – 2017-05-12 (×5): 40 meq via ORAL
  Filled 2017-05-10 (×10): qty 4

## 2017-05-10 MED ORDER — PIPERACILLIN-TAZOBACTAM 3.375 G IVPB 30 MIN
3.3750 g | Freq: Once | INTRAVENOUS | Status: AC
  Administered 2017-05-10: 3.375 g via INTRAVENOUS
  Filled 2017-05-10 (×2): qty 50

## 2017-05-10 MED ORDER — MOMETASONE FURO-FORMOTEROL FUM 200-5 MCG/ACT IN AERO
2.0000 | INHALATION_SPRAY | Freq: Two times a day (BID) | RESPIRATORY_TRACT | Status: DC
Start: 2017-05-10 — End: 2017-05-15
  Administered 2017-05-10 – 2017-05-15 (×11): 2 via RESPIRATORY_TRACT
  Filled 2017-05-10: qty 8.8

## 2017-05-10 MED ORDER — SPIRONOLACTONE 100 MG PO TABS
300.0000 mg | ORAL_TABLET | Freq: Every day | ORAL | Status: DC
  Administered 2017-05-10 – 2017-05-15 (×6): 300 mg via ORAL
  Filled 2017-05-10 (×6): qty 3

## 2017-05-10 MED ORDER — FUROSEMIDE 10 MG/ML IJ SOLN
40.0000 mg | Freq: Two times a day (BID) | INTRAMUSCULAR | Status: DC
  Administered 2017-05-10 – 2017-05-11 (×2): 40 mg via INTRAVENOUS
  Filled 2017-05-10 (×2): qty 4

## 2017-05-10 MED ORDER — TIOTROPIUM BROMIDE MONOHYDRATE 18 MCG IN CAPS
18.0000 ug | ORAL_CAPSULE | Freq: Every day | RESPIRATORY_TRACT | Status: DC
  Administered 2017-05-11 – 2017-05-15 (×4): 18 ug via RESPIRATORY_TRACT
  Filled 2017-05-10 (×2): qty 5

## 2017-05-10 MED ORDER — MORPHINE SULFATE (PF) 4 MG/ML IV SOLN
4.0000 mg | Freq: Once | INTRAVENOUS | Status: AC
  Administered 2017-05-10: 4 mg via INTRAVENOUS
  Filled 2017-05-10: qty 1

## 2017-05-10 MED ORDER — ALBUTEROL SULFATE (2.5 MG/3ML) 0.083% IN NEBU: 3.0000 mL | INHALATION_SOLUTION | Freq: Four times a day (QID) | RESPIRATORY_TRACT | Status: DC | PRN

## 2017-05-10 MED ORDER — DICYCLOMINE HCL 10 MG PO CAPS
10.0000 mg | ORAL_CAPSULE | Freq: Three times a day (TID) | ORAL | Status: DC
  Administered 2017-05-10 – 2017-05-15 (×21): 10 mg via ORAL
  Filled 2017-05-10 (×24): qty 1

## 2017-05-10 MED ORDER — FAMOTIDINE 20 MG PO TABS
20.0000 mg | ORAL_TABLET | Freq: Two times a day (BID) | ORAL | Status: DC
  Administered 2017-05-10 – 2017-05-15 (×11): 20 mg via ORAL
  Filled 2017-05-10 (×11): qty 1

## 2017-05-10 MED ORDER — IPRATROPIUM-ALBUTEROL 0.5-2.5 (3) MG/3ML IN SOLN: 3.0000 mL | RESPIRATORY_TRACT | Status: DC | PRN

## 2017-05-10 MED ORDER — FUROSEMIDE 10 MG/ML IJ SOLN
20.0000 mg | Freq: Two times a day (BID) | INTRAMUSCULAR | Status: DC
  Administered 2017-05-10: 20 mg via INTRAVENOUS
  Filled 2017-05-10: qty 2

## 2017-05-10 MED ORDER — LACTULOSE 10 GM/15ML PO SOLN
30.0000 g | Freq: Two times a day (BID) | ORAL | Status: DC
  Administered 2017-05-10 – 2017-05-11 (×3): 30 g via ORAL
  Filled 2017-05-10 (×3): qty 45

## 2017-05-10 MED ORDER — PREDNISONE 1 MG PO TABS
1.0000 mg | ORAL_TABLET | ORAL | Status: DC
  Administered 2017-05-10 – 2017-05-14 (×3): 1 mg via ORAL
  Filled 2017-05-10 (×3): qty 1

## 2017-05-10 MED ORDER — VANCOMYCIN HCL IN DEXTROSE 750-5 MG/150ML-% IV SOLN
750.0000 mg | Freq: Two times a day (BID) | INTRAVENOUS | Status: DC
  Administered 2017-05-10 – 2017-05-12 (×4): 750 mg via INTRAVENOUS
  Filled 2017-05-10 (×5): qty 150

## 2017-05-10 NOTE — H&P (Signed)
PCP:   Copland, Gay Filler, MD   Chief Complaint:  Leg pain  HPI: This is a pleasant 56 year old female with known history of sarcoidosis, liver cirrhosis, immunocompromised patient. On Sunday she scratched her left lower extremity. It was became red and hot and she is to ER. In the ER she was given Keflex which she has been taken with no improvement. Her cellulitis is steadily worsening, today she is having difficulty walking on the lower extremity..  She also has since developed some nausea and vomiting and upset stomach. She states her stomach has  has been cramping. She  reports intermittent constipation and diarrhea. The patient is on lactulose for constipation. She states she has a history of gallstones which occasionally and this felt similar to that. Patient denies any fever or chills. Today she returned to [facility] for re-evaluation of her leg, she was treated vancomycin and Zosyn. she was transferred to [facility] for further treatment.  Review of Systems:  The patient denies anorexia, fever, weight loss,, vision loss, decreased hearing, hoarseness, chest pain, syncope, dyspnea on exertion, peripheral edema, balance deficits, hemoptysis, abdominal pain, melena, hematochezia, severe indigestion/heartburn, hematuria, incontinence, genital sores, leg pain, nausea, , suspicious skin lesions, transient blindness, difficulty walking, depression, unusual weight change, cellulitis abnormal bleeding, enlarged lymph nodes, angioedema, and breast masses.  Past Medical History: Past Medical History:  Diagnosis Date  . Allergy   . Anemia   . Asthma   . Cellulitis   . GERD (gastroesophageal reflux disease)   . Hepatic fibrosis    congenital  . History of UTI    chronic as a child  . Sarcoidosis   . Spleen enlarged    Past Surgical History:  Procedure Laterality Date  . COLONOSCOPY    . CYST EXCISION Left 05/01/2017   Procedure: LEFT INDEX FINGER CYST EXCISION;  Surgeon: Leandrew Koyanagi,  MD;  Location: [place];  Service: Orthopedics;  Laterality: Left;  . ESOPHAGOGASTRODUODENOSCOPY    . SPINAL FUSION  12/2007   Double spinal Fusion  . VIDEO BRONCHOSCOPY Bilateral 12/08/2015   Procedure: VIDEO BRONCHOSCOPY WITH FLUORO;  Surgeon: Rigoberto Noel, MD;  Location: [place];  Service: Cardiopulmonary;  Laterality: Bilateral;    Medications: Prior to Admission medications   Medication Sig Start Date End Date Taking? Authorizing Provider  albuterol (PROVENTIL HFA;VENTOLIN HFA) 108 (90 Base) MCG/ACT inhaler Inhale 1-2 puffs into the lungs every 6 (six) hours as needed for wheezing or shortness of breath. 10/19/16   Palumbo, April, MD  benzonatate (TESSALON) 100 MG capsule Take 100 mg by mouth 3 (three) times daily as needed for cough.  01/30/17   [provider]  cephALEXin (KEFLEX) 500 MG capsule Take 1 capsule (500 mg total) by mouth 4 (four) times daily. 4/0/98   Delora Fuel, MD  dicyclomine (BENTYL) 10 MG capsule Take 1 capsule (10 mg total) by mouth 4 (four) times daily -  before meals and at bedtime. 05/08/17   Wendling, Crosby Oyster, DO  DULERA 200-5 MCG/ACT AERO INHALE 2 PUFFS BY MOUTH TWO TIMES DAILY. 03/17/17   Rigoberto Noel, MD  furosemide (LASIX) 40 MG tablet Take 120 mg by mouth See admin instructions. Take 120 mg daily every morning & an additional dose on Monday and Thursday (120mg ) at night    [provider]  ibuprofen (ADVIL,MOTRIN) 200 MG tablet Take 400 mg by mouth every 6 (six) hours as needed for headache, mild pain or moderate pain.    [provider]  ipratropium-albuterol (DUONEB) 0.5-2.5 (3) MG/3ML SOLN TAKE 3 MLS BY NEBULIZATION EVERY 4 (FOUR) HOURS AS NEEDED. 11/18/16   Copland, Gay Filler, MD  lactulose (CHRONULAC) 10 GM/15ML solution Take 30 g by mouth 2 (two) times daily.  06/27/16   [provider]  ondansetron (ZOFRAN) 4 MG tablet Take 1 tablet (4 mg total) by mouth 2 (two) times daily as needed for nausea or vomiting. 05/08/17    Shelda Pal, DO  oxyCODONE (OXY IR/ROXICODONE) 5 MG immediate release tablet Take 1 tablet (5 mg total) by mouth every 4 (four) hours as needed. 05/01/17   Leandrew Koyanagi, MD  pantoprazole (PROTONIX) 40 MG tablet Take 1 tablet by mouth 2 (two) times daily. Before breakfast & before supper 04/05/11   [provider]  potassium chloride (K-DUR) 10 MEQ tablet Take 4 tablets (40 mEq total) by mouth 2 (two) times daily. 04/29/17   Leandrew Koyanagi, MD  predniSONE (DELTASONE) 1 MG tablet Take 1 mg by mouth every other day.  01/27/17   [provider]  ranitidine (ZANTAC) 300 MG tablet TAKE 1 TABLET AT BEDTIME 12/20/16   Rigoberto Noel, MD  SPIRIVA RESPIMAT 2.5 MCG/ACT AERS Inhale 2 puffs into the lungs daily at 2 PM.  01/27/17   [provider]  spironolactone (ALDACTONE) 100 MG tablet Take 300 mg by mouth daily.     [provider]    Allergies:   Allergies  Allergen Reactions  . Aspirin Other (See Comments)    Causes Asthma  . Peanut-Containing Drug Products Shortness Of Breath    Asthma, eye swelling, itchy eyes, can't catch her breath  . Gabapentin Other (See Comments)    Blisters in mouth, nausea   . Azithromycin Nausea And Vomiting    Social History:  reports that she has never smoked. She has never used smokeless tobacco. She reports that she does not drink alcohol or use drugs.  Family History: Family History  Problem Relation Age of Onset  . Arthritis Mother   . Heart disease Father        Triple bypass  . Migraines Father   . Heart attack Paternal Grandmother   . Stroke Paternal Grandfather   . Liver disease Sister        Juvenile cysitc liver disease  . Asthma Sister     Physical Exam: Vitals:   05/10/17 0235 05/10/17 0343 05/10/17 0344 05/10/17 0437  BP: 134/81 121/68  124/62  Pulse: 97  (!) 103 (!) 102  Resp: 15  19 19   Temp: 99.5 F (37.5 C)  99.3 F (37.4 C) 100 F (37.8 C)  TempSrc: Oral  Oral Oral  SpO2: 98%  94% 99%   Weight:      Height:        General:  Alert and oriented times three, well developed and nourished, no acute distress Eyes: PERRLA, pink conjunctiva, scleral icterus ENT: Moist oral mucosa, neck supple, no thyromegaly Lungs: clear to ascultation, no wheeze, no crackles, no use of accessory muscles Cardiovascular: regular rate and rhythm, no regurgitation, no gallops, no murmurs. No carotid bruits, no JVD Abdomen: soft, positive BS, non-tender, non-distended, no organomegaly, not an acute abdomen GU: not examined Neuro: CN II - XII grossly intact, sensation intact Musculoskeletal: strength 5/5 all extremities, no clubbing, cyanosis 3+ B/l LE pitting edema Skin: no rash, no subcutaneous crepitation, no decubitus, cellulitic left lower extremity Psych: appropriate patient   Labs on Admission:   Recent Labs  05/10/17 0046  NA 134*  K 3.8  CL 101  CO2 24  GLUCOSE 144*  BUN 13  CREATININE 1.11*  CALCIUM 7.8*    Recent Labs  05/10/17 0046  AST 56*  ALT 35  ALKPHOS 183*  BILITOT 3.9*  PROT 5.5*  ALBUMIN 2.6*   No results for input(s): LIPASE, AMYLASE in the last 72 hours.  Recent Labs  05/10/17 0046  WBC 12.3*  NEUTROABS 10.2*  HGB 10.7*  HCT 31.1*  MCV 101.3*  PLT 94*   No results for input(s): CKTOTAL, CKMB, CKMBINDEX, TROPONINI in the last 72 hours. Invalid input(s): POCBNP No results for input(s): DDIMER in the last 72 hours. No results for input(s): HGBA1C in the last 72 hours. No results for input(s): CHOL, HDL, LDLCALC, TRIG, CHOLHDL, LDLDIRECT in the last 72 hours. No results for input(s): TSH, T4TOTAL, T3FREE, THYROIDAB in the last 72 hours.  Invalid input(s): FREET3 No results for input(s): VITAMINB12, FOLATE, FERRITIN, TIBC, IRON, RETICCTPCT in the last 72 hours.  Micro Results: No results found for this or any previous visit (from the past 240 hour(s)).   Radiological Exams on Admission: Dg Tibia/fibula Left  Result Date:  05/10/2017 CLINICAL DATA:  Acute onset of left leg swelling and erythema. Fever. Initial encounter. EXAM: LEFT TIBIA AND FIBULA - 2 VIEW COMPARISON:  None. FINDINGS: There is no evidence of fracture or dislocation. The tibia and fibula appear grossly intact. The knee joint is grossly unremarkable in appearance. No knee joint effusion is identified. Diffuse soft tissue swelling is noted about the left lower leg, without definite evidence of soft tissue air. IMPRESSION: No evidence of fracture or dislocation. Electronically Signed   By: Garald Balding M.D.   On: 05/10/2017 01:38    Assessment/Plan Present on Admission: . Cellulitis left lower extremity -Admit to MedSurg -Blood cultures 2 -Vancomycin and Zosyn pharmacy to dose  . Immunocompromised patient [facility]) -Patient on by mouth steroids for the last 32 months, now down to 1 mg every other day. Treatment for sarcoidosis  . 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 CKD (chronic kidney disease), stage III -Aware   Shyia Fillingim 05/10/2017, 5:33 AM

## 2017-05-10 NOTE — ED Provider Notes (Addendum)
[place] DEPT [facility] Provider Note   CSN: [id number] Arrival date & time: 05/09/17  2325     History   Chief Complaint Chief Complaint  Patient presents with  . Leg Pain    HPI Alicia [name] is a 56 y.o. female.  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  She had a wound to her left lower extremity there was seen and evaluated in the emergency department on 05/04/2017 was treated with Dermabond.  She states she was doing well when she suddenly developed significant increase in her swelling and redness over left lower extremity extending from the lower leg all the way up to her left groin and left medial thigh.  No fevers but does report chills.  Has an oral temp of 100.3 on arrival to the emergency department.  She reports she has a history of significant skin infections before they've required hospitalization.  She is on chronic steroids.  She has adrenal insufficiency.  She is currently on the liver transplant list.    Past Medical History:  Diagnosis Date  . Allergy   . Anemia   . Asthma   . Cellulitis   . GERD (gastroesophageal reflux disease)   . Hepatic fibrosis    congenital  . History of UTI    chronic as a child  . Sarcoidosis   . Spleen enlarged     Patient Active Problem List   Diagnosis Date Noted  . Mucous cyst of digit of left hand 04/11/2017  . Cellulitis, abdominal wall 03/01/2017  . Normocytic anemia 03/01/2017  . Hypokalemia 03/01/2017  . CKD (chronic kidney disease), stage III 03/01/2017  . Abdominal wall cellulitis 03/01/2017  . Adrenal insufficiency ([place]) 03/01/2017  . Hyperammonemia ([place])   . Cellulitis 06/28/2016  .  Thrombocytopenia ([place]) 06/28/2016  . AKI (acute kidney injury) ([place]) 06/28/2016  . Multiple pulmonary nodules determined by computed tomography of lung   . Chronic cough 09/07/2015  . Hypertension, portal ([place]) 09/04/2011  . Asthma with chronic obstructive pulmonary disease (COPD) ([place]) 09/04/2011  . 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 GERD (gastroesophageal reflux disease) 07/04/2011  . Sarcoid ([place]) 07/01/2011  . Asthma, severe persistent, with upper airway instability 06/13/2011  . 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 COLONOSCOPY    . CYST EXCISION Left 05/01/2017   Procedure: LEFT INDEX FINGER CYST EXCISION;  Surgeon: Leandrew Koyanagi, MD;  Location: [place];  Service: Orthopedics;  Laterality: Left;  . ESOPHAGOGASTRODUODENOSCOPY    . SPINAL FUSION  12/2007   Double spinal Fusion  . VIDEO BRONCHOSCOPY Bilateral 12/08/2015   Procedure: VIDEO BRONCHOSCOPY WITH FLUORO;  Surgeon: Rigoberto Noel, MD;  Location: [place];  Service: Cardiopulmonary;  Laterality: Bilateral;    OB History    No data available       Home Medications    Prior to Admission medications   Medication Sig Start Date End Date Taking? Authorizing Provider  albuterol (PROVENTIL HFA;VENTOLIN HFA) 108 (90 Base) MCG/ACT inhaler Inhale 1-2 puffs into the lungs every 6 (six) hours as needed for wheezing or shortness of breath. 10/19/16   Palumbo, April, MD  benzonatate (TESSALON) 100 MG capsule Take 100 mg by mouth 3 (three)  times daily as needed for cough.  01/30/17   [provider]  cephALEXin (KEFLEX) 500 MG capsule Take 1 capsule (500 mg total) by mouth 4 (four) times daily. 03/07/19   Delora Fuel, MD  dicyclomine (BENTYL) 10 MG capsule Take 1 capsule (10 mg total) by mouth 4 (four) times daily -  before meals and at bedtime. 05/08/17   Wendling, Crosby Oyster, DO  DULERA 200-5 MCG/ACT AERO INHALE 2 PUFFS BY MOUTH TWO TIMES DAILY. 03/17/17   Rigoberto Noel,  MD  furosemide (LASIX) 40 MG tablet Take 120 mg by mouth See admin instructions. Take 120 mg daily every morning & an additional dose on Monday and Thursday (120mg ) at night    [provider]  ibuprofen (ADVIL,MOTRIN) 200 MG tablet Take 400 mg by mouth every 6 (six) hours as needed for headache, mild pain or moderate pain.    [provider]  ipratropium-albuterol (DUONEB) 0.5-2.5 (3) MG/3ML SOLN TAKE 3 MLS BY NEBULIZATION EVERY 4 (FOUR) HOURS AS NEEDED. 11/18/16   Copland, Gay Filler, MD  lactulose (CHRONULAC) 10 GM/15ML solution Take 30 g by mouth 2 (two) times daily.  06/27/16   [provider]  ondansetron (ZOFRAN) 4 MG tablet Take 1 tablet (4 mg total) by mouth 2 (two) times daily as needed for nausea or vomiting. 05/08/17   Shelda Pal, DO  oxyCODONE (OXY IR/ROXICODONE) 5 MG immediate release tablet Take 1 tablet (5 mg total) by mouth every 4 (four) hours as needed. 05/01/17   Leandrew Koyanagi, MD  pantoprazole (PROTONIX) 40 MG tablet Take 1 tablet by mouth 2 (two) times daily. Before breakfast & before supper 04/05/11   [provider]  potassium chloride (K-DUR) 10 MEQ tablet Take 4 tablets (40 mEq total) by mouth 2 (two) times daily. 04/29/17   Leandrew Koyanagi, MD  predniSONE (DELTASONE) 1 MG tablet Take 1 mg by mouth every other day.  01/27/17   [provider]  ranitidine (ZANTAC) 300 MG tablet TAKE 1 TABLET AT BEDTIME 12/20/16   Rigoberto Noel, MD  SPIRIVA RESPIMAT 2.5 MCG/ACT AERS Inhale 2 puffs into the lungs daily at 2 PM.  01/27/17   [provider]  spironolactone (ALDACTONE) 100 MG tablet Take 300 mg by mouth daily.     [provider]    Family History Family History  Problem Relation Age of Onset  . Arthritis Mother   . Heart disease Father        Triple bypass  . Migraines Father   . Heart attack Paternal Grandmother   . Stroke Paternal Grandfather   . Liver disease Sister        Juvenile cysitc liver disease  .  Asthma Sister     Social History Social History  Substance Use Topics  . Smoking status: Never Smoker  . Smokeless tobacco: Never Used  . Alcohol use No     Allergies   Aspirin; Peanut-containing drug products; Gabapentin; and Azithromycin   Review of Systems Review of Systems  All other systems reviewed and are negative.    Physical Exam Updated Vital Signs BP 129/66 (BP Location: Right Arm)   Pulse (!) 105   Temp 100.3 F (37.9 C) (Oral)   Resp (!) 22   Ht 5\' 2"  (1.575 m)   Wt 68.5 kg (151 lb)   SpO2 100%   BMI 27.62 kg/m   Physical Exam  Constitutional: She is oriented to person, place, and time. She appears well-developed  and well-nourished.  HENT:  Head: Normocephalic.  Eyes: EOM are normal.  Neck: Normal range of motion.  Cardiovascular: Normal rate.   Pulmonary/Chest: Effort normal.  Abdominal: She exhibits no distension.  Musculoskeletal: Normal range of motion.  Erythema and warmth of the left lower extremity from the left lower leg all the way up to the left medial thigh.  Tenderness along this area.  Dermabond overlying skin tear of the left proximal anterior tibia with surrounding erythema.  No fluctuance or drainage of the wound or surrounding areas.  Normal pulses left foot.  Neurological: She is alert and oriented to person, place, and time.  Psychiatric: She has a normal mood and affect.  Nursing note and vitals reviewed.    ED Treatments / Results  Labs (all labs ordered are listed, but only abnormal results are displayed) Labs Reviewed  CBC WITH DIFFERENTIAL/PLATELET - Abnormal; Notable for the following:       Result Value   WBC 12.3 (*)    RBC 3.07 (*)    Hemoglobin 10.7 (*)    HCT 31.1 (*)    MCV 101.3 (*)    MCH 34.9 (*)    Platelets 94 (*)    Neutro Abs 10.2 (*)    Monocytes Absolute 1.2 (*)    All other components within normal limits  COMPREHENSIVE METABOLIC PANEL - Abnormal; Notable for the following:    Sodium 134 (*)     Glucose, Bld 144 (*)    Creatinine, Ser 1.11 (*)    Calcium 7.8 (*)    Total Protein 5.5 (*)    Albumin 2.6 (*)    AST 56 (*)    Alkaline Phosphatase 183 (*)    Total Bilirubin 3.9 (*)    GFR calc non Af Amer 54 (*)    All other components within normal limits  CULTURE, BLOOD (ROUTINE X 2)  CULTURE, BLOOD (ROUTINE X 2)    EKG  EKG Interpretation None       Radiology Dg Tibia/fibula Left  Result Date: 05/10/2017 CLINICAL DATA:  Acute onset of left leg swelling and erythema. Fever. Initial encounter. EXAM: LEFT TIBIA AND FIBULA - 2 VIEW COMPARISON:  None. FINDINGS: There is no evidence of fracture or dislocation. The tibia and fibula appear grossly intact. The knee joint is grossly unremarkable in appearance. No knee joint effusion is identified. Diffuse soft tissue swelling is noted about the left lower leg, without definite evidence of soft tissue air. IMPRESSION: No evidence of fracture or dislocation. Electronically Signed   By: Garald Balding M.D.   On: 05/10/2017 01:38    Procedures Procedures (including critical care time)  Medications Ordered in ED Medications  vancomycin (VANCOCIN) IVPB 1000 mg/200 mL premix (0 mg Intravenous Stopped 05/10/17 0158)  piperacillin-tazobactam (ZOSYN) IVPB 3.375 g (0 g Intravenous Stopped 05/10/17 0152)  morphine 4 MG/ML injection 4 mg (4 mg Intravenous Given 05/10/17 0053)  ondansetron (ZOFRAN) injection 4 mg (4 mg Intravenous Given 05/10/17 0122)     Initial Impression / Assessment and Plan / ED Course  I have reviewed the triage vital signs and the nursing notes.  Pertinent labs & imaging results that were available during my care of the patient were reviewed by me and considered in my medical decision making (see chart for details).     Multiple comorbidities.  Significant cellulitis of the left lower extremity.  Given her comp less medical history and the rapid progression of her left lower [place] cellulitis she will be  admitted the  hospital for IV antibiotics.  IV antibiotic initiated in the emergency department.  We'll continue to monitor for developing sepsis.  Final Clinical Impressions(s) / ED Diagnoses   Final diagnoses:  Cellulitis of left lower extremity    New Prescriptions New Prescriptions   No medications on file     Jola Schmidt, MD 05/10/17 [id number]    Jola Schmidt, MD 05/10/17 [phone]

## 2017-05-10 NOTE — Progress Notes (Signed)
Pharmacy Antibiotic Note  Alicia [name] is a 56 y.o. female admitted on 05/09/2017 with cellulitis.  Pharmacy has been consulted for vancomycin dosing.  Pt with PMH of sarcoidosis, liver cirrhosis, immunocompromised patient.   Plan:  Vancomycin 1000 mg IV x1, then 750 mg IV q12h. target trough 10 - 15 mcg/ml   Monitor clinical course, renal function, cultures as available   Height: 5\' 2"  (157.5 cm) Weight: 151 lb (68.5 kg) IBW/kg (Calculated) : 50.1  Temp (24hrs), Avg:99.8 F (37.7 C), Min:99.3 F (37.4 C), Max:100.3 F (37.9 C)   Recent Labs Lab 05/10/17 0046  WBC 12.3*  CREATININE 1.11*    Estimated Creatinine Clearance: 50.1 mL/min (A) (by C-G formula based on SCr of 1.11 mg/dL (H)).    Allergies  Allergen Reactions  . Aspirin Other (See Comments)    Causes Asthma  . Peanut-Containing Drug Products Shortness Of Breath    Asthma, eye swelling, itchy eyes, can't catch her breath  . Gabapentin Other (See Comments)    Blisters in mouth, nausea   . Azithromycin Nausea And Vomiting    Antimicrobials this admission: 8/11  Zosyn x 1 8/11 vancomycin >>   Dose adjustments this admission: ---  Microbiology results: 8/11 BCx: sent   Thank you for allowing pharmacy to be a part of this patient's care.  Royetta Asal, PharmD, BCPS Pager [phone] 05/10/2017 9:53 AM

## 2017-05-10 NOTE — Progress Notes (Signed)
PROGRESS NOTE    Mckaela Howley  ZMO:[id number] DOB: 1961-08-26 DOA: 05/09/2017 PCP: Darreld Mclean, MD  Brief Narrative: 56 year old female with history of congenital hepatic fibrosis now cirrhosis and portal hypertension, on a transplant list at [facility], pulmonary second sarcoidosis, adrenal insufficiency on prednisone admitted with left lower extremity cellulitis and this morning 8/11   Assessment & Plan:    1. Left lower extremity cellulitis -Presented with fever redness pain and swelling -Continue IV vancomycin -Follow-up blood cultures -Elevate left lower extremity and I have resumed her diuretics  2. Liver cirrhosis secondary to congenital hepatic fibrosis, portal hypertension, followed by [facility] on transplant list with volume overload -She is 11 pounds above her dry weight, and clinically profoundly volume overloaded -IV Lasix today, continue Aldactone -Monitor weight, I/O closely -B met in a.m.  3. Pulmonary sarcoidosis  -Asymptomatic, follow-up with pulmonologist at [facility]   4. Mild adrenal insufficiency  -On prednisone 1 mg every other day now, continue no signs of acute adrenal insufficiency  -No indication for stress dose steroids at this time   5. CKD (chronic kidney disease), stage III  -Stable, monitor with diuresis  6. Chronic thrombocytopenia  -Secondary to liver cirrhosis, portal hypertension   monitor-  DVT prophylaxis: SCDs due to thrombocytopenia  Code Status:  full code  Family Communication: no family at bedside  Disposition Plan: Home in 1-2 days pending improvement in cellulitis and volume status    Antimicrobials:   IV vancomycin   Subjective: Still has significant pain and swelling, no nausea or vomiting Objective: Vitals:   05/10/17 0343 05/10/17 0344 05/10/17 0437 05/10/17 0831  BP: 121/68  124/62   Pulse:  (!) 103 (!) 102   Resp:  19 19   Temp:  99.3 F (37.4 C) 100 F (37.8 C)   TempSrc:  Oral Oral   SpO2:  94% 99%  94%  Weight:      Height:        Intake/Output Summary (Last 24 hours) at 05/10/17 1401 Last data filed at 05/10/17 0911  Gross per 24 hour  Intake              360 ml  Output              450 ml  Net              -90 ml   Filed Weights   05/09/17 2335  Weight: 68.5 kg (151 lb)    Examination:  General exam: Appears calm and comfortable  Respiratory system: Clear to auscultation. Respiratory effort normal. Cardiovascular system: S1 & S2 heard, RRR. No JVD, murmurs, rubs, gallops or clicks. No pedal edema. Gastrointestinal system: Abdomen is nondistended, soft and nontender. Normal bowel sounds heard. Central nervous system: Alert and oriented. No focal neurological deficits. Extremities: Erythema, warmth, tenderness overlying the left lower extremity with redness tracking up her inner thigh, mild tender inguinal adenopathy noted 2+ edema left lower extremity and 1+ in the right Skin: As above Psychiatry: Judgement and insight appear normal. Mood & affect appropriate.     Data Reviewed:   CBC:  Recent Labs Lab 05/10/17 0046  WBC 12.3*  NEUTROABS 10.2*  HGB 10.7*  HCT 31.1*  MCV 101.3*  PLT 94*   Basic Metabolic Panel:  Recent Labs Lab 05/10/17 0046  NA 134*  K 3.8  CL 101  CO2 24  GLUCOSE 144*  BUN 13  CREATININE 1.11*  CALCIUM 7.8*   GFR: Estimated Creatinine Clearance: 50.1  mL/min (A) (by C-G formula based on SCr of 1.11 mg/dL (H)). Liver Function Tests:  Recent Labs Lab 05/10/17 0046  AST 56*  ALT 35  ALKPHOS 183*  BILITOT 3.9*  PROT 5.5*  ALBUMIN 2.6*   No results for input(s): LIPASE, AMYLASE in the last 168 hours. No results for input(s): AMMONIA in the last 168 hours. Coagulation Profile: No results for input(s): INR, PROTIME in the last 168 hours. Cardiac Enzymes: No results for input(s): CKTOTAL, CKMB, CKMBINDEX, TROPONINI in the last 168 hours. BNP (last 3 results) No results for input(s): PROBNP in the last 8760  hours. HbA1C: No results for input(s): HGBA1C in the last 72 hours. CBG: No results for input(s): GLUCAP in the last 168 hours. Lipid Profile: No results for input(s): CHOL, HDL, LDLCALC, TRIG, CHOLHDL, LDLDIRECT in the last 72 hours. Thyroid Function Tests: No results for input(s): TSH, T4TOTAL, FREET4, T3FREE, THYROIDAB in the last 72 hours. Anemia Panel: No results for input(s): VITAMINB12, FOLATE, FERRITIN, TIBC, IRON, RETICCTPCT in the last 72 hours. Urine analysis:    Component Value Date/Time   COLORURINE AMBER (A) 03/01/2017 1414   APPEARANCEUR CLOUDY (A) 03/01/2017 1414   LABSPEC 1.015 03/01/2017 1414   PHURINE 6.0 03/01/2017 1414   GLUCOSEU 100 (A) 03/01/2017 1414   HGBUR NEGATIVE 03/01/2017 1414   BILIRUBINUR NEGATIVE 03/01/2017 1414   KETONESUR NEGATIVE 03/01/2017 1414   PROTEINUR NEGATIVE 03/01/2017 1414   NITRITE NEGATIVE 03/01/2017 1414   LEUKOCYTESUR NEGATIVE 03/01/2017 1414   Sepsis Labs: '@[email or web address]' (procalcitonin:4,lacticidven:4)  )No results found for this or any previous visit (from the past 240 hour(s)).       Radiology Studies: Dg Tibia/fibula Left  Result Date: 05/10/2017 CLINICAL DATA:  Acute onset of left leg swelling and erythema. Fever. Initial encounter. EXAM: LEFT TIBIA AND FIBULA - 2 VIEW COMPARISON:  None. FINDINGS: There is no evidence of fracture or dislocation. The tibia and fibula appear grossly intact. The knee joint is grossly unremarkable in appearance. No knee joint effusion is identified. Diffuse soft tissue swelling is noted about the left lower leg, without definite evidence of soft tissue air. IMPRESSION: No evidence of fracture or dislocation. Electronically Signed   By: Garald Balding M.D.   On: 05/10/2017 01:38        Scheduled Meds: . dicyclomine  10 mg Oral TID AC & HS  . famotidine  20 mg Oral BID  . furosemide  40 mg Intravenous BID  . lactulose  30 g Oral BID  . mometasone-formoterol  2 puff Inhalation BID  .  pantoprazole  40 mg Oral BID  . potassium chloride  40 mEq Oral BID  . predniSONE  1 mg Oral QODAY  . spironolactone  300 mg Oral Daily  . tiotropium  18 mcg Inhalation Q1400   Continuous Infusions: . vancomycin 750 mg (05/10/17 1224)     LOS: 0 days    Time spent: 27mn    PDomenic Polite MD [facility] Pager [phone] If 7PM-7AM, please contact night-coverage [email or web address] Password [email or web address] 05/10/2017, 2:01 PM

## 2017-05-11 LAB — COMPREHENSIVE METABOLIC PANEL
ALBUMIN: 2.3 g/dL — AB (ref 3.5–5.0)
ALK PHOS: 153 U/L — AB (ref 38–126)
ALT: 28 U/L (ref 14–54)
ANION GAP: 4 — AB (ref 5–15)
AST: 31 U/L (ref 15–41)
BUN: 13 mg/dL (ref 6–20)
CALCIUM: 8 mg/dL — AB (ref 8.9–10.3)
CO2: 25 mmol/L (ref 22–32)
Chloride: 102 mmol/L (ref 101–111)
Creatinine, Ser: 0.98 mg/dL (ref 0.44–1.00)
GFR calc Af Amer: >60 mL/min (ref >60)
GFR calc non Af Amer: >60 mL/min (ref >60)
GLUCOSE: 202 mg/dL — AB (ref 65–99)
POTASSIUM: 4.8 mmol/L (ref 3.5–5.1)
SODIUM: 131 mmol/L — AB (ref 135–145)
Total Bilirubin: 3.9 mg/dL — ABNORMAL HIGH (ref 0.3–1.2)
Total Protein: 5.3 g/dL — ABNORMAL LOW (ref 6.5–8.1)

## 2017-05-11 LAB — CBC
HCT: 28.3 % — ABNORMAL LOW (ref 36.0–46.0)
HEMOGLOBIN: 10.1 g/dL — AB (ref 12.0–15.0)
MCH: 35.2 pg — AB (ref 26.0–34.0)
MCHC: 35.7 g/dL (ref 30.0–36.0)
MCV: 98.6 fL (ref 78.0–100.0)
Platelets: 68 10*3/uL — ABNORMAL LOW (ref 150–400)
RBC: 2.87 MIL/uL — AB (ref 3.87–5.11)
RDW: 15.4 % (ref 11.5–15.5)
WBC: 11.5 10*3/uL — ABNORMAL HIGH (ref 4.0–10.5)

## 2017-05-11 LAB — PROTIME-INR
INR: 1.38
Prothrombin Time: 17.1 seconds — ABNORMAL HIGH (ref 11.4–15.2)

## 2017-05-11 MED ORDER — FUROSEMIDE 10 MG/ML IJ SOLN
40.0000 mg | Freq: Once | INTRAMUSCULAR | Status: AC
  Administered 2017-05-11: 40 mg via INTRAVENOUS
  Filled 2017-05-11: qty 4

## 2017-05-11 MED ORDER — LACTULOSE 10 GM/15ML PO SOLN
20.0000 g | Freq: Two times a day (BID) | ORAL | Status: DC
  Administered 2017-05-11 – 2017-05-14 (×6): 20 g via ORAL
  Filled 2017-05-11 (×6): qty 30

## 2017-05-11 MED ORDER — FUROSEMIDE 10 MG/ML IJ SOLN: 80.0000 mg | Freq: Two times a day (BID) | INTRAMUSCULAR | Status: DC

## 2017-05-11 MED ORDER — PIPERACILLIN-TAZOBACTAM 3.375 G IVPB
3.3750 g | Freq: Three times a day (TID) | INTRAVENOUS | Status: DC
  Administered 2017-05-11 – 2017-05-13 (×6): 3.375 g via INTRAVENOUS
  Filled 2017-05-11 (×7): qty 50

## 2017-05-11 MED ORDER — FUROSEMIDE 10 MG/ML IJ SOLN
80.0000 mg | Freq: Two times a day (BID) | INTRAMUSCULAR | Status: DC
  Administered 2017-05-11 – 2017-05-13 (×4): 80 mg via INTRAVENOUS
  Filled 2017-05-11 (×4): qty 8

## 2017-05-11 MED ORDER — FUROSEMIDE 10 MG/ML IJ SOLN: 60.0000 mg | Freq: Two times a day (BID) | INTRAMUSCULAR | Status: DC

## 2017-05-11 NOTE — Progress Notes (Signed)
Pharmacy Antibiotic Note  Rosabelle Danh is a 56 y.o. female admitted on 05/09/2017 with cellulitis.  Pharmacy has been consulted for vancomycin/Zosyn dosing.  Pt with PMH of sarcoidosis, liver cirrhosis, immunocompromised patient.   Plan:  Vancomycin 1000 mg IV x1, then 750 mg IV q12h. target trough 10 - 15 mcg/ml   Zosyn 3.375 gr IV q8h extended infusion  Daily SCr  Monitor clinical course, renal function, cultures as available   Height: 5\' 2"  (157.5 cm) Weight: 151 lb (68.5 kg) IBW/kg (Calculated) : 50.1  Temp (24hrs), Avg:98.5 F (36.9 C), Min:98.3 F (36.8 C), Max:98.7 F (37.1 C)   Recent Labs Lab 05/10/17 0046 05/11/17 0607  WBC 12.3* 11.5*  CREATININE 1.11*  --     Estimated Creatinine Clearance: 50.1 mL/min (A) (by C-G formula based on SCr of 1.11 mg/dL (H)).    Allergies  Allergen Reactions  . Aspirin Other (See Comments)    Causes Asthma  . Peanut-Containing Drug Products Shortness Of Breath    Asthma, eye swelling, itchy eyes, can't catch her breath  . Gabapentin Other (See Comments)    Blisters in mouth, nausea   . Azithromycin Nausea And Vomiting    Antimicrobials this admission: 8/11  Zosyn x 1 8/12 >> 8/11 vancomycin >>   Dose adjustments this admission: ---  Microbiology results: 8/11 BCx: sent   Thank you for allowing pharmacy to be a part of this patient's care.  Royetta Asal, PharmD, BCPS Pager [phone] 05/11/2017 10:14 AM

## 2017-05-11 NOTE — Progress Notes (Signed)
PROGRESS NOTE    Alicia [name]  JSH:[id number] DOB: 1961-03-28 DOA: 05/09/2017 PCP: Darreld Mclean, MD  Brief Narrative: 56 year old female with history of congenital hepatic fibrosis now cirrhosis and portal hypertension, on a transplant list at [facility], pulmonary second sarcoidosis, adrenal insufficiency on prednisone admitted with left lower extremity cellulitis and this morning 8/11  Assessment & Plan:    1. Left lower extremity cellulitis -Presented with fever redness pain and swelling, treated with IV vancomycin from 8/11 AM -Clinically much worse today, increased erythema or warmth tenderness and extension to the thigh, we'll add IV Zosyn, follow-up blood cultures -Increased diuretics -Elevate left lower extremity  2. Liver cirrhosis secondary to congenital hepatic fibrosis, portal hypertension, followed by [facility] on transplant list with volume overload -She is 11 pounds above her dry weight, and clinically profoundly volume overloaded -Without much response to IV Lasix at 40 mg every 12 will increase this to 80 mg twice a day , continue Aldactone -Monitor weight, I/O closely -B met in a.m.  3. Pulmonary sarcoidosis  -Asymptomatic, follow-up with pulmonologist at [facility]   4. Mild adrenal insufficiency  -On prednisone 1 mg every other day now, continue no signs of acute adrenal insufficiency  -No indication for stress dose steroids at this time   5. CKD (chronic kidney disease), stage III  -Stable, monitor with diuresis  6. Chronic thrombocytopenia  -Secondary to liver cirrhosis, portal hypertension   monitor-  DVT prophylaxis: SCDs due to thrombocytopenia  Code Status:  full code  Family Communication: no family at bedside  Disposition Plan: Home pending improvement in volume status and cellulitis    Antimicrobials:   IV vancomycin   Subjective: Left leg feels worse today, in terms of redness pain and swelling and extension to the thigh -No nausea  vomiting, no dyspnea  Objective: Vitals:   05/10/17 1433 05/10/17 1912 05/10/17 2141 05/11/17 1031  BP: 124/66  136/72   Pulse: 91  98   Resp: 17  18   Temp: 98.7 F (37.1 C)  98.3 F (36.8 C)   TempSrc: Oral  Oral   SpO2: 99% 97% 96%   Weight:    70.6 kg (155 lb 10.3 oz)  Height:        Intake/Output Summary (Last 24 hours) at 05/11/17 1057 Last data filed at 05/11/17 0802  Gross per 24 hour  Intake              240 ml  Output              800 ml  Net             -560 ml   Filed Weights   05/09/17 2335 05/11/17 1031  Weight: 68.5 kg (151 lb) 70.6 kg (155 lb 10.3 oz)    Examination:  Gen: Awake, Alert, Oriented X 3,  HEENT: PERRLA, Neck supple, no JVD Lungs: Good air movement bilaterally, CTAB CVS: RRR,No Gallops,Rubs or new Murmurs Abd: soft, Non tender, non distended, BS present Extremities:  2+ edema in both lower extremities, left lower extremity appears worse today with increasing erythema warmth tenderness increased redness tracking up the left high and tender inguinal adenopathy Psychiatry: very pleasant    Data Reviewed:   CBC:  Recent Labs Lab 05/10/17 0046 05/11/17 0607  WBC 12.3* 11.5*  NEUTROABS 10.2*  --   HGB 10.7* 10.1*  HCT 31.1* 28.3*  MCV 101.3* 98.6  PLT 94* 68*   Basic Metabolic Panel:  Recent Labs Lab 05/10/17  0046  NA 134*  K 3.8  CL 101  CO2 24  GLUCOSE 144*  BUN 13  CREATININE 1.11*  CALCIUM 7.8*   GFR: Estimated Creatinine Clearance: 50.8 mL/min (A) (by C-G formula based on SCr of 1.11 mg/dL (H)). Liver Function Tests:  Recent Labs Lab 05/10/17 0046  AST 56*  ALT 35  ALKPHOS 183*  BILITOT 3.9*  PROT 5.5*  ALBUMIN 2.6*   No results for input(s): LIPASE, AMYLASE in the last 168 hours. No results for input(s): AMMONIA in the last 168 hours. Coagulation Profile:  Recent Labs Lab 05/11/17 0607  INR 1.38   Cardiac Enzymes: No results for input(s): CKTOTAL, CKMB, CKMBINDEX, TROPONINI in the last 168  hours. BNP (last 3 results) No results for input(s): PROBNP in the last 8760 hours. HbA1C: No results for input(s): HGBA1C in the last 72 hours. CBG: No results for input(s): GLUCAP in the last 168 hours. Lipid Profile: No results for input(s): CHOL, HDL, LDLCALC, TRIG, CHOLHDL, LDLDIRECT in the last 72 hours. Thyroid Function Tests: No results for input(s): TSH, T4TOTAL, FREET4, T3FREE, THYROIDAB in the last 72 hours. Anemia Panel: No results for input(s): VITAMINB12, FOLATE, FERRITIN, TIBC, IRON, RETICCTPCT in the last 72 hours. Urine analysis:    Component Value Date/Time   COLORURINE AMBER (A) 03/01/2017 1414   APPEARANCEUR CLOUDY (A) 03/01/2017 1414   LABSPEC 1.015 03/01/2017 1414   PHURINE 6.0 03/01/2017 1414   GLUCOSEU 100 (A) 03/01/2017 1414   HGBUR NEGATIVE 03/01/2017 1414   BILIRUBINUR NEGATIVE 03/01/2017 1414   KETONESUR NEGATIVE 03/01/2017 1414   PROTEINUR NEGATIVE 03/01/2017 1414   NITRITE NEGATIVE 03/01/2017 1414   LEUKOCYTESUR NEGATIVE 03/01/2017 1414   Sepsis Labs: '@[email or web address]' (procalcitonin:4,lacticidven:4)  )No results found for this or any previous visit (from the past 240 hour(s)).       Radiology Studies: Dg Tibia/fibula Left  Result Date: 05/10/2017 CLINICAL DATA:  Acute onset of left leg swelling and erythema. Fever. Initial encounter. EXAM: LEFT TIBIA AND FIBULA - 2 VIEW COMPARISON:  None. FINDINGS: There is no evidence of fracture or dislocation. The tibia and fibula appear grossly intact. The knee joint is grossly unremarkable in appearance. No knee joint effusion is identified. Diffuse soft tissue swelling is noted about the left lower leg, without definite evidence of soft tissue air. IMPRESSION: No evidence of fracture or dislocation. Electronically Signed   By: Garald Balding M.D.   On: 05/10/2017 01:38        Scheduled Meds: . dicyclomine  10 mg Oral TID AC & HS  . famotidine  20 mg Oral BID  . furosemide  80 mg Intravenous BID  .  lactulose  20 g Oral BID  . mometasone-formoterol  2 puff Inhalation BID  . pantoprazole  40 mg Oral BID  . potassium chloride  40 mEq Oral BID  . predniSONE  1 mg Oral QODAY  . spironolactone  300 mg Oral Daily  . tiotropium  18 mcg Inhalation Q1400   Continuous Infusions: . piperacillin-tazobactam (ZOSYN)  IV    . vancomycin Stopped (05/11/17 0004)     LOS: 1 day    Time spent: 61mn    PDomenic Polite MD [facility] Pager [phone] If 7PM-7AM, please contact night-coverage [email or web address] Password [email or web address]8/08/2017, 10:57 AM

## 2017-05-12 LAB — CBC
HCT: 28.1 % — ABNORMAL LOW (ref 36.0–46.0)
Hemoglobin: 9.9 g/dL — ABNORMAL LOW (ref 12.0–15.0)
MCH: 34.9 pg — AB (ref 26.0–34.0)
MCHC: 35.2 g/dL (ref 30.0–36.0)
MCV: 98.9 fL (ref 78.0–100.0)
PLATELETS: 71 10*3/uL — AB (ref 150–400)
RBC: 2.84 MIL/uL — ABNORMAL LOW (ref 3.87–5.11)
RDW: 15.2 % (ref 11.5–15.5)
WBC: 7.7 10*3/uL (ref 4.0–10.5)

## 2017-05-12 LAB — BASIC METABOLIC PANEL
Anion gap: 7 (ref 5–15)
BUN: 13 mg/dL (ref 6–20)
CALCIUM: 7.9 mg/dL — AB (ref 8.9–10.3)
CO2: 25 mmol/L (ref 22–32)
Chloride: 100 mmol/L — ABNORMAL LOW (ref 101–111)
Creatinine, Ser: 1.07 mg/dL — ABNORMAL HIGH (ref 0.44–1.00)
GFR calc Af Amer: >60 mL/min (ref >60)
GFR, EST NON AFRICAN AMERICAN: 56 mL/min — AB (ref >60)
GLUCOSE: 226 mg/dL — AB (ref 65–99)
Potassium: 4.9 mmol/L (ref 3.5–5.1)
Sodium: 132 mmol/L — ABNORMAL LOW (ref 135–145)

## 2017-05-12 LAB — VANCOMYCIN, TROUGH: Vancomycin Tr: 20 ug/mL (ref 15–20)

## 2017-05-12 MED ORDER — PROMETHAZINE HCL 25 MG/ML IJ SOLN
6.2500 mg | Freq: Four times a day (QID) | INTRAMUSCULAR | Status: DC | PRN
  Administered 2017-05-13 (×2): 6.25 mg via INTRAVENOUS
  Filled 2017-05-12 (×4): qty 1

## 2017-05-12 MED ORDER — VANCOMYCIN HCL 500 MG IV SOLR
500.0000 mg | Freq: Two times a day (BID) | INTRAVENOUS | Status: DC
  Administered 2017-05-12 – 2017-05-13 (×2): 500 mg via INTRAVENOUS
  Filled 2017-05-12 (×3): qty 500

## 2017-05-12 MED ORDER — ONDANSETRON HCL 4 MG/2ML IJ SOLN
4.0000 mg | Freq: Four times a day (QID) | INTRAMUSCULAR | Status: DC | PRN
  Administered 2017-05-12 – 2017-05-13 (×3): 4 mg via INTRAVENOUS
  Filled 2017-05-12 (×3): qty 2

## 2017-05-12 NOTE — Progress Notes (Signed)
Paged Dr.Jospeph, patient extremely nauseated at this time with nothing for n/v ordered. Waiting for new orders.

## 2017-05-12 NOTE — Progress Notes (Signed)
PROGRESS NOTE    Alicia [name]  MRN:[id number] DOB: 10/14/1960 DOA: 05/09/2017 PCP: Copland, Jessica C, MD  Brief Narrative: 56-year-old female with history of congenital hepatic fibrosis now cirrhosis and portal hypertension, on a transplant list at [facility], pulmonary second sarcoidosis, adrenal insufficiency on prednisone admitted with left lower extremity cellulitis and this morning 8/11  Assessment & Plan:    1. Left lower extremity cellulitis -Presented with fever redness pain and swelling, treated with IV vancomycin from 8/11 AM -Clinically worsened yesterday and hence added IV Zosyn -blood cultures negative,  -continue diuretics -Elevate left lower extremity  2. Liver cirrhosis secondary to congenital hepatic fibrosis, portal hypertension, followed by [facility] on transplant list with volume overload -She is 11 pounds above her dry weight, and clinically profoundly volume overloaded -continue high dose IV lasix at 80 mg twice a day , continue Aldactone -stop lasix, diuresed 4.8L yesterday -B met in a.m.  3. Pulmonary sarcoidosis  -Asymptomatic, follow-up with pulmonologist at [facility]   4. Mild adrenal insufficiency  -On prednisone 1 mg every other day now, continue no signs of acute adrenal insufficiency  -No indication for stress dose steroids at this time   5. CKD (chronic kidney disease), stage III  -Stable, monitor with diuresis  6. Chronic thrombocytopenia  -Secondary to liver cirrhosis, portal hypertension   monitor-  DVT prophylaxis: SCDs due to thrombocytopenia  Code Status:  full code  Family Communication: no family at bedside  Disposition Plan: Home pending improvement in volume status and cellulitis    Antimicrobials:   IV vancomycin   Subjective: -L leg improving redness and swelling, some nausea and diarrhea  Objective: Vitals:   05/11/17 2102 05/11/17 2241 05/12/17 0556 05/12/17 0700  BP: 128/60  120/68   Pulse: 88  76   Resp: 16  18     Temp: 98.4 F (36.9 C)  98 F (36.7 C)   TempSrc: Oral     SpO2: 97% 97% 97%   Weight:   68.4 kg (150 lb 14.4 oz) 72.8 kg (160 lb 7.9 oz)  Height:        Intake/Output Summary (Last 24 hours) at 05/12/17 1304 Last data filed at 05/12/17 1154  Gross per 24 hour  Intake             1353 ml  Output             6200 ml  Net            -4847 ml   Filed Weights   05/11/17 1031 05/12/17 0556 05/12/17 0700  Weight: 70.6 kg (155 lb 10.3 oz) 68.4 kg (150 lb 14.4 oz) 72.8 kg (160 lb 7.9 oz)    Examination:  Gen: Awake, Alert, Oriented X 3, no distress HEENT: PERRLA, Neck supple, no JVD Lungs: Good air movement bilaterally, CTAB CVS: RRR,No Gallops,Rubs or new Murmurs Abd: soft, Non tender, non distended, BS present Extremities: 1+ edema in both lower extremities, left lower leg and thigh finally improving in terms of erythema tenderness and swelling, old healing ulcer anterolateral lower leg Skin: no new rashes   Data Reviewed:   CBC:  Recent Labs Lab 05/10/17 0046 05/11/17 0607 05/12/17 0548  WBC 12.3* 11.5* 7.7  NEUTROABS 10.2*  --   --   HGB 10.7* 10.1* 9.9*  HCT 31.1* 28.3* 28.1*  MCV 101.3* 98.6 98.9  PLT 94* 68* 71*   Basic Metabolic Panel:  Recent Labs Lab 05/10/17 0046 05/11/17 0607 05/12/17 0548  NA 134*   131* 132*  K 3.8 4.8 4.9  CL 101 102 100*  CO2 24 25 25  GLUCOSE 144* 202* 226*  BUN 13 13 13  CREATININE 1.11* 0.98 1.07*  CALCIUM 7.8* 8.0* 7.9*   GFR: Estimated Creatinine Clearance: 53.6 mL/min (A) (by C-G formula based on SCr of 1.07 mg/dL (H)). Liver Function Tests:  Recent Labs Lab 05/10/17 0046 05/11/17 0607  AST 56* 31  ALT 35 28  ALKPHOS 183* 153*  BILITOT 3.9* 3.9*  PROT 5.5* 5.3*  ALBUMIN 2.6* 2.3*   No results for input(s): LIPASE, AMYLASE in the last 168 hours. No results for input(s): AMMONIA in the last 168 hours. Coagulation Profile:  Recent Labs Lab 05/11/17 0607  INR 1.38   Cardiac Enzymes: No results for  input(s): CKTOTAL, CKMB, CKMBINDEX, TROPONINI in the last 168 hours. BNP (last 3 results) No results for input(s): PROBNP in the last 8760 hours. HbA1C: No results for input(s): HGBA1C in the last 72 hours. CBG: No results for input(s): GLUCAP in the last 168 hours. Lipid Profile: No results for input(s): CHOL, HDL, LDLCALC, TRIG, CHOLHDL, LDLDIRECT in the last 72 hours. Thyroid Function Tests: No results for input(s): TSH, T4TOTAL, FREET4, T3FREE, THYROIDAB in the last 72 hours. Anemia Panel: No results for input(s): VITAMINB12, FOLATE, FERRITIN, TIBC, IRON, RETICCTPCT in the last 72 hours. Urine analysis:    Component Value Date/Time   COLORURINE AMBER (A) 03/01/2017 1414   APPEARANCEUR CLOUDY (A) 03/01/2017 1414   LABSPEC 1.015 03/01/2017 1414   PHURINE 6.0 03/01/2017 1414   GLUCOSEU 100 (A) 03/01/2017 1414   HGBUR NEGATIVE 03/01/2017 1414   BILIRUBINUR NEGATIVE 03/01/2017 1414   KETONESUR NEGATIVE 03/01/2017 1414   PROTEINUR NEGATIVE 03/01/2017 1414   NITRITE NEGATIVE 03/01/2017 1414   LEUKOCYTESUR NEGATIVE 03/01/2017 1414   Sepsis Labs: @LABRCNTIP(procalcitonin:4,lacticidven:4)  ) Recent Results (from the past 240 hour(s))  Blood culture (routine x 2)     Status: None (Preliminary result)   Collection Time: 05/10/17 12:46 AM  Result Value Ref Range Status   Specimen Description BLOOD LEFT ARM  Final   Special Requests   Final    BOTTLES DRAWN AEROBIC AND ANAEROBIC Blood Culture adequate volume   Culture   Final    NO GROWTH 1 DAY Performed at [facility], [address] [place], [place] [address]    Report Status PENDING  Incomplete  Blood culture (routine x 2)     Status: None (Preliminary result)   Collection Time: 05/10/17 12:46 AM  Result Value Ref Range Status   Specimen Description BLOOD LEFT ARM  Final   Special Requests   Final    BOTTLES DRAWN AEROBIC AND ANAEROBIC Blood Culture adequate volume   Culture   Final    NO GROWTH 1 DAY Performed at  [facility], [address] [place], [place] [address]    Report Status PENDING  Incomplete         Radiology Studies: No results found.      Scheduled Meds: . dicyclomine  10 mg Oral TID AC & HS  . famotidine  20 mg Oral BID  . furosemide  80 mg Intravenous BID  . lactulose  20 g Oral BID  . mometasone-formoterol  2 puff Inhalation BID  . pantoprazole  40 mg Oral BID  . predniSONE  1 mg Oral QODAY  . spironolactone  300 mg Oral Daily  . tiotropium  18 mcg Inhalation Q1400   Continuous Infusions: . piperacillin-tazobactam (  ZOSYN)  IV Stopped (05/12/17 0803)  . vancomycin       LOS: 2 days    Time spent: 60mn    Alicia Polite MD [facility] Pager [phone] If 7PM-7AM, please contact night-coverage [email or web address] Password [email or web address] 05/12/2017, 1:04 PM

## 2017-05-12 NOTE — Progress Notes (Signed)
Pharmacy Antibiotic Note  Alicia [name] is a 56 y.o. female admitted on 05/09/2017 with cellulitis.  Pharmacy has been consulted for vancomycin/Zosyn dosing for progressive worsening of erythema and tenderness extending up her thigh. Pt with PMH of sarcoidosis, liver cirrhosis, immunocompromised patient.   Plan:  Continue Zosyn 3.375g IV Q8H infused over 4hrs. Reduce to Vancomycin 500 mg IV q12h. Recheck Vanc trough at steady state.  Goal VT 10-15. Follow up renal fxn, culture results, and clinical course.   Height: 5\' 2"  (157.5 cm) Weight: 160 lb 7.9 oz (72.8 kg) IBW/kg (Calculated) : 50.1  Temp (24hrs), Avg:98.1 F (36.7 C), Min:97.9 F (36.6 C), Max:98.4 F (36.9 C)   Recent Labs Lab 05/10/17 0046 05/11/17 0607 05/12/17 0548 05/12/17 1045  WBC 12.3* 11.5* 7.7  --   CREATININE 1.11* 0.98 1.07*  --   VANCOTROUGH  --   --   --  20    Estimated Creatinine Clearance: 53.6 mL/min (A) (by C-G formula based on SCr of 1.07 mg/dL (H)).    Allergies  Allergen Reactions  . Aspirin Other (See Comments)    Causes Asthma  . Peanut-Containing Drug Products Shortness Of Breath    Asthma, eye swelling, itchy eyes, can't catch her breath  . Gabapentin Other (See Comments)    Blisters in mouth, nausea   . Azithromycin Nausea And Vomiting    Antimicrobials this admission: 8/11  Zosyn x 1 8/12 >> 8/11 vancomycin >>   Dose adjustments this admission: 8/13 1100 VT = 20, drawn early and still above goal 10-15.  Microbiology results: 8/11 BCx: sent   Thank you for allowing pharmacy to be a part of this patient's care.  Gretta Arab PharmD, BCPS Pager [phone] 05/12/2017 12:59 PM

## 2017-05-13 LAB — BASIC METABOLIC PANEL
Anion gap: 8 (ref 5–15)
BUN: 15 mg/dL (ref 6–20)
CHLORIDE: 98 mmol/L — AB (ref 101–111)
CO2: 28 mmol/L (ref 22–32)
CREATININE: 1.43 mg/dL — AB (ref 0.44–1.00)
Calcium: 8.1 mg/dL — ABNORMAL LOW (ref 8.9–10.3)
GFR calc Af Amer: 46 mL/min — ABNORMAL LOW (ref >60)
GFR calc non Af Amer: 40 mL/min — ABNORMAL LOW (ref >60)
GLUCOSE: 131 mg/dL — AB (ref 65–99)
Potassium: 4.5 mmol/L (ref 3.5–5.1)
SODIUM: 134 mmol/L — AB (ref 135–145)

## 2017-05-13 LAB — CBC
HCT: 29.5 % — ABNORMAL LOW (ref 36.0–46.0)
Hemoglobin: 10 g/dL — ABNORMAL LOW (ref 12.0–15.0)
MCH: 33.2 pg (ref 26.0–34.0)
MCHC: 33.9 g/dL (ref 30.0–36.0)
MCV: 98 fL (ref 78.0–100.0)
PLATELETS: 80 10*3/uL — AB (ref 150–400)
RBC: 3.01 MIL/uL — ABNORMAL LOW (ref 3.87–5.11)
RDW: 15.3 % (ref 11.5–15.5)
WBC: 6.1 10*3/uL (ref 4.0–10.5)

## 2017-05-13 MED ORDER — SODIUM CHLORIDE 0.9 % IV SOLN
500.0000 mg | INTRAVENOUS | Status: DC
  Administered 2017-05-14 – 2017-05-15 (×2): 500 mg via INTRAVENOUS
  Filled 2017-05-13 (×2): qty 500

## 2017-05-13 MED ORDER — FUROSEMIDE 40 MG PO TABS
40.0000 mg | ORAL_TABLET | Freq: Two times a day (BID) | ORAL | Status: DC
  Administered 2017-05-14: 40 mg via ORAL
  Filled 2017-05-13: qty 1

## 2017-05-13 MED ORDER — DEXTROSE 5 % IV SOLN
1.0000 g | INTRAVENOUS | Status: DC
  Administered 2017-05-13 – 2017-05-14 (×2): 1 g via INTRAVENOUS
  Filled 2017-05-13 (×2): qty 10

## 2017-05-13 NOTE — Progress Notes (Signed)
Pharmacy Antibiotic Note  Alicia [name] is a 56 y.o. female admitted on 05/09/2017 with cellulitis.  Pharmacy was consulted for vancomycin/Zosyn dosing for progressive worsening of erythema and tenderness extending up her thigh.  Zosyn is now changed to ceftriaxone. Pt with PMH of sarcoidosis, liver cirrhosis, immunocompromised patient.   Today, 05/13/2017: SCr increased significantly (up to 1.43) with CrCl ~ 38 ml/min.  She has had a large amount of diuresis (-10L) and lasix dose is being held today then adjusted per MD.  Lajean Silvius was changed to ceftriaxone which will limit concurrent nephrotoxicity.  Vancomycin dose was adjusted yesterday, but will be empirically reduced further today for rising SCr.   Plan: Ceftriaxone 1g IV q24h Reduce to Vancomycin 500 mg IV q24h Recheck Vanc trough at steady state.  Goal VT 10-15. Follow up renal fxn, culture results, and clinical course.   Height: 5\' 2"  (157.5 cm) Weight: 145 lb 11.6 oz (66.1 kg) IBW/kg (Calculated) : 50.1  Temp (24hrs), Avg:98.2 F (36.8 C), Min:97.8 F (36.6 C), Max:98.7 F (37.1 C)   Recent Labs Lab 05/10/17 0046 05/11/17 0607 05/12/17 0548 05/12/17 1045 05/13/17 0558  WBC 12.3* 11.5* 7.7  --  6.1  CREATININE 1.11* 0.98 1.07*  --  1.43*  VANCOTROUGH  --   --   --  20  --     Estimated Creatinine Clearance: 38.2 mL/min (A) (by C-G formula based on SCr of 1.43 mg/dL (H)).    Allergies  Allergen Reactions  . Aspirin Other (See Comments)    Causes Asthma  . Peanut-Containing Drug Products Shortness Of Breath    Asthma, eye swelling, itchy eyes, can't catch her breath  . Gabapentin Other (See Comments)    Blisters in mouth, nausea   . Azithromycin Nausea And Vomiting    Antimicrobials this admission: 8/11  Zosyn,  8/12 >> 8/14 8/11 vancomycin >>  8/14 Ceftriaxone >>  Dose adjustments this admission: 8/13 1100 VT = 20, drawn early and still above goal 10-15.  Microbiology results: 8/11 BCx: ngtd   Thank  you for allowing pharmacy to be a part of this patient's care.  Gretta Arab PharmD, BCPS Pager [phone] 05/13/2017 10:53 AM

## 2017-05-13 NOTE — Progress Notes (Signed)
PROGRESS NOTE    Alicia [name]  URK:[id number] DOB: [birth year] DOA: 05/09/2017 PCP: Darreld Mclean, MD  Brief Narrative: 56 year old female with history of congenital hepatic fibrosis now cirrhosis and portal hypertension, on a transplant list at [facility], pulmonary second sarcoidosis, adrenal insufficiency on prednisone admitted with left lower extremity cellulitis and severe volume overload/ 8/11  Assessment & Plan:    1. Left lower extremity cellulitis -Presented with fever redness pain and swelling, treated with IV vancomycin from 8/11 AM -Has clinically worsened on 8/12 and hence I added IV Zosyn, will change Zosyn to IV ceftriaxone since this will to cover most gram negatives except Pseudomonas, and side effects -blood cultures negative,  -diuresed well, 10L negative, elevate left lower extremity -change to Po Abx at discharge  2. Liver cirrhosis secondary to congenital hepatic fibrosis/ portal hypertension, followed by [facility] on transplant list  -now with volume overload/anasarca -She is 11 pounds above her dry weight, and clinically was profoundly volume overloaded on admission -diuresed with IV lasix 80 mg twice a day and Aldactone, she is negative 10.8L -hold further diuretics toxday with bump in creatinine, change lasix to PO for tomrorow -FU Bmet  3. Pulmonary sarcoidosis  -Asymptomatic, follow-up with pulmonologist at [facility]   4. Mild adrenal insufficiency  -On prednisone 1 mg every other day now, continue no signs of acute adrenal insufficiency  -No indication for stress dose steroids  5. CKD (chronic kidney disease), stage III  -Stable, monitor with diuresis  6. Chronic thrombocytopenia  -Secondary to liver cirrhosis, portal hypertension   monitor-  DVT prophylaxis: SCDs due to thrombocytopenia  Code Status:  full code  Family Communication: no family at bedside  Disposition Plan: Home in 1-2 days   Antimicrobials:   IV  vancomycin   Subjective: -Frustrated about one illness after the other, while awaiting liver transplant -Pain and swelling much better today feels very sick and nauseated after starting IV Zosyn,   Objective: Vitals:   05/13/17 0500 05/13/17 0621 05/13/17 0946 05/13/17 1342  BP:  (!) 112/46  110/62  Pulse:  97  96  Resp:  16  16  Temp:  98.7 F (37.1 C)  98.7 F (37.1 C)  TempSrc:  Oral  Axillary  SpO2:  98% 98% 100%  Weight: 66.1 kg (145 lb 11.6 oz)     Height:        Intake/Output Summary (Last 24 hours) at 05/13/17 1510 Last data filed at 05/13/17 1402  Gross per 24 hour  Intake             1020 ml  Output             5850 ml  Net            -4830 ml   Filed Weights   05/12/17 0556 05/12/17 0700 05/13/17 0500  Weight: 68.4 kg (150 lb 14.4 oz) 72.8 kg (160 lb 7.9 oz) 66.1 kg (145 lb 11.6 oz)    Examination:  Gen: Awake, Alert, Oriented X 3, tearful today HEENT: PERRLA, Neck supple, no JVD Lungs: Good air movement bilaterally, CTAB CVS: RRR,No Gallops,Rubs or new Murmurs Abd: soft, Non tender, non distended, BS present Extremities: left lower leg and thigh, much improved, erythema and tenderness and swelling considerably better Skin: no new rashes    Data Reviewed:   CBC:  Recent Labs Lab 05/10/17 0046 05/11/17 0607 05/12/17 0548 05/13/17 0558  WBC 12.3* 11.5* 7.7 6.1  NEUTROABS 10.2*  --   --   --  HGB 10.7* 10.1* 9.9* 10.0*  HCT 31.1* 28.3* 28.1* 29.5*  MCV 101.3* 98.6 98.9 98.0  PLT 94* 68* 71* 80*   Basic Metabolic Panel:  Recent Labs Lab 05/10/17 0046 05/11/17 0607 05/12/17 0548 05/13/17 0558  NA 134* 131* 132* 134*  K 3.8 4.8 4.9 4.5  CL 101 102 100* 98*  CO2 24 25 25 28   GLUCOSE 144* 202* 226* 131*  BUN 13 13 13 15   CREATININE 1.11* 0.98 1.07* 1.43*  CALCIUM 7.8* 8.0* 7.9* 8.1*   GFR: Estimated Creatinine Clearance: 38.2 mL/min (A) (by C-G formula based on SCr of 1.43 mg/dL (H)). Liver Function Tests:  Recent Labs Lab  05/10/17 0046 05/11/17 0607  AST 56* 31  ALT 35 28  ALKPHOS 183* 153*  BILITOT 3.9* 3.9*  PROT 5.5* 5.3*  ALBUMIN 2.6* 2.3*   No results for input(s): LIPASE, AMYLASE in the last 168 hours. No results for input(s): AMMONIA in the last 168 hours. Coagulation Profile:  Recent Labs Lab 05/11/17 0607  INR 1.38   Cardiac Enzymes: No results for input(s): CKTOTAL, CKMB, CKMBINDEX, TROPONINI in the last 168 hours. BNP (last 3 results) No results for input(s): PROBNP in the last 8760 hours. HbA1C: No results for input(s): HGBA1C in the last 72 hours. CBG: No results for input(s): GLUCAP in the last 168 hours. Lipid Profile: No results for input(s): CHOL, HDL, LDLCALC, TRIG, CHOLHDL, LDLDIRECT in the last 72 hours. Thyroid Function Tests: No results for input(s): TSH, T4TOTAL, FREET4, T3FREE, THYROIDAB in the last 72 hours. Anemia Panel: No results for input(s): VITAMINB12, FOLATE, FERRITIN, TIBC, IRON, RETICCTPCT in the last 72 hours. Urine analysis:    Component Value Date/Time   COLORURINE AMBER (A) 03/01/2017 1414   APPEARANCEUR CLOUDY (A) 03/01/2017 1414   LABSPEC 1.015 03/01/2017 1414   PHURINE 6.0 03/01/2017 1414   GLUCOSEU 100 (A) 03/01/2017 1414   HGBUR NEGATIVE 03/01/2017 1414   BILIRUBINUR NEGATIVE 03/01/2017 1414   KETONESUR NEGATIVE 03/01/2017 1414   PROTEINUR NEGATIVE 03/01/2017 1414   NITRITE NEGATIVE 03/01/2017 1414   LEUKOCYTESUR NEGATIVE 03/01/2017 1414   Sepsis Labs: @LABRCNTIP (procalcitonin:4,lacticidven:4)  ) Recent Results (from the past 240 hour(s))  Blood culture (routine x 2)     Status: None (Preliminary result)   Collection Time: 05/10/17 12:46 AM  Result Value Ref Range Status   Specimen Description BLOOD LEFT ARM  Final   Special Requests   Final    BOTTLES DRAWN AEROBIC AND ANAEROBIC Blood Culture adequate volume   Culture   Final    NO GROWTH 3 DAYS Performed at [facility], [place] [address] [place], [place] [address]     Report Status PENDING  Incomplete  Blood culture (routine x 2)     Status: None (Preliminary result)   Collection Time: 05/10/17 12:46 AM  Result Value Ref Range Status   Specimen Description BLOOD LEFT ARM  Final   Special Requests   Final    BOTTLES DRAWN AEROBIC AND ANAEROBIC Blood Culture adequate volume   Culture   Final    NO GROWTH 3 DAYS Performed at [facility], [address] [place], [place] [address]    Report Status PENDING  Incomplete         Radiology Studies: No results found.      Scheduled Meds: . dicyclomine  10 mg Oral TID AC & HS  . famotidine  20 mg Oral BID  . [START ON 05/14/2017] furosemide  40 mg Oral BID  .  lactulose  20 g Oral BID  . mometasone-formoterol  2 puff Inhalation BID  . pantoprazole  40 mg Oral BID  . predniSONE  1 mg Oral QODAY  . spironolactone  300 mg Oral Daily  . tiotropium  18 mcg Inhalation Q1400   Continuous Infusions: . cefTRIAXone (ROCEPHIN)  IV Stopped (05/13/17 1347)  . [START ON 05/14/2017] vancomycin       LOS: 3 days    Time spent: 49min    Domenic Polite, MD [facility] Pager [phone]  If 7PM-7AM, please contact night-coverage [email or web address] Password [email or web address] 05/13/2017, 3:10 PM

## 2017-05-14 DIAGNOSIS — N183 Chronic kidney disease, stage 3 (moderate): Secondary | ICD-10-CM

## 2017-05-14 DIAGNOSIS — L03116 Cellulitis of left lower limb: Principal | ICD-10-CM

## 2017-05-14 LAB — C DIFFICILE QUICK SCREEN W PCR REFLEX
C DIFFICILE (CDIFF) INTERP: NOT DETECTED
C DIFFICILE (CDIFF) TOXIN: NEGATIVE
C DIFFICLE (CDIFF) ANTIGEN: NEGATIVE

## 2017-05-14 LAB — BASIC METABOLIC PANEL
ANION GAP: 6 (ref 5–15)
BUN: 15 mg/dL (ref 6–20)
CHLORIDE: 98 mmol/L — AB (ref 101–111)
CO2: 27 mmol/L (ref 22–32)
Calcium: 8 mg/dL — ABNORMAL LOW (ref 8.9–10.3)
Creatinine, Ser: 1.27 mg/dL — ABNORMAL HIGH (ref 0.44–1.00)
GFR calc non Af Amer: 46 mL/min — ABNORMAL LOW (ref >60)
GFR, EST AFRICAN AMERICAN: 53 mL/min — AB (ref >60)
Glucose, Bld: 193 mg/dL — ABNORMAL HIGH (ref 65–99)
Potassium: 3.8 mmol/L (ref 3.5–5.1)
Sodium: 131 mmol/L — ABNORMAL LOW (ref 135–145)

## 2017-05-14 MED ORDER — FUROSEMIDE 40 MG PO TABS
40.0000 mg | ORAL_TABLET | Freq: Two times a day (BID) | ORAL | Status: DC
  Administered 2017-05-15: 40 mg via ORAL
  Filled 2017-05-14: qty 1

## 2017-05-14 MED ORDER — SACCHAROMYCES BOULARDII 250 MG PO CAPS
250.0000 mg | ORAL_CAPSULE | Freq: Two times a day (BID) | ORAL | Status: DC
  Administered 2017-05-14 – 2017-05-15 (×3): 250 mg via ORAL
  Filled 2017-05-14 (×3): qty 1

## 2017-05-14 NOTE — Progress Notes (Signed)
PROGRESS NOTE    Cameryn Schum  ZCH:[id number] DOB: 02-Sep-1961 DOA: 05/09/2017 PCP: Darreld Mclean, MD  Brief Narrative: 56 year old female with history of congenital hepatic fibrosis now cirrhosis and portal hypertension, on a transplant list at [facility], pulmonary second sarcoidosis, adrenal insufficiency on prednisone admitted with left lower extremity cellulitis and severe volume overload/ 8/11  Assessment & Plan:    1. Left lower extremity cellulitis -Presented with fever redness pain and swelling, treated with IV vancomycin from 8/11  -Has clinically worsened on 8/12 and hence I added IV Zosyn, then change Zosyn to IV ceftriaxone since this will to cover most gram negatives except Pseudomonas, and side effects -blood cultures negative,  -diuresed well, 15 L negative, elevate left lower extremity -change to Po Abx at discharge -redness improved.   2. Liver cirrhosis secondary to congenital hepatic fibrosis/ portal hypertension, followed by [facility] on transplant list  -now with volume overload/anasarca -She is 11 pounds above her dry weight, and clinically was profoundly volume overloaded on admission -diuresed with IV lasix 80 mg twice a day and Aldactone, she is negative 15.8L -cr stable. Resume lasix, skip night dose today due to diarrhea.  -FU Bmet  3. Pulmonary sarcoidosis  -Asymptomatic, follow-up with pulmonologist at [facility]   4. Mild adrenal insufficiency  -On prednisone 1 mg every other day now, continue no signs of acute adrenal insufficiency  -No indication for stress dose steroids  5. CKD (chronic kidney disease), stage III  -Stable, monitor with diuresis  6. Chronic thrombocytopenia  -Secondary to liver cirrhosis, portal hypertension   monitor-  7-Diarrhea; she report 5 BM last night. And 3 so far this morning.  Will hold lactulose. If diarrhea persist tomorrow will need to consider testing for C diff.   DVT prophylaxis: SCDs due to thrombocytopenia    Code Status:  full code  Family Communication: husband and mother who were at bedside,.  Disposition Plan: Home in 1-2 days   Antimicrobials:   IV vancomycin   Subjective: -report improvement of redness left lower extremity.  She has skin tear, left lower extremity. Her doctor glue it.    Objective: Vitals:   05/13/17 2259 05/14/17 0550 05/14/17 0755 05/14/17 1419  BP: 114/80 120/74 116/68 124/62  Pulse: 97 83  91  Resp: 15 13    Temp: 97.9 F (36.6 C) 98.6 F (37 C)  99 F (37.2 C)  TempSrc: Axillary Oral  Oral  SpO2: 100% 100%  100%  Weight:  65.6 kg (144 lb 10 oz)    Height:        Intake/Output Summary (Last 24 hours) at 05/14/17 1621 Last data filed at 05/14/17 1404  Gross per 24 hour  Intake             1420 ml  Output             4901 ml  Net            -3481 ml   Filed Weights   05/12/17 0700 05/13/17 0500 05/14/17 0550  Weight: 72.8 kg (160 lb 7.9 oz) 66.1 kg (145 lb 11.6 oz) 65.6 kg (144 lb 10 oz)    Examination:  Gen: NAD, alert  HEENT: supple, no JVD Lungs: Normal respiratory effort, CTA CVS: S 1, S 2 RRR Abd: Soft, nt, nd Extremities: Left LE with edema and less redness  Skin: skin tear, lower extremity present on admission     Data Reviewed:   CBC:  Recent Labs Lab 05/10/17  7628 05/11/17 0607 05/12/17 0548 05/13/17 0558  WBC 12.3* 11.5* 7.7 6.1  NEUTROABS 10.2*  --   --   --   HGB 10.7* 10.1* 9.9* 10.0*  HCT 31.1* 28.3* 28.1* 29.5*  MCV 101.3* 98.6 98.9 98.0  PLT 94* 68* 71* 80*   Basic Metabolic Panel:  Recent Labs Lab 05/10/17 0046 05/11/17 0607 05/12/17 0548 05/13/17 0558 05/14/17 0551  NA 134* 131* 132* 134* 131*  K 3.8 4.8 4.9 4.5 3.8  CL 101 102 100* 98* 98*  CO2 24 25 25 28 27   GLUCOSE 144* 202* 226* 131* 193*  BUN 13 13 13 15 15   CREATININE 1.11* 0.98 1.07* 1.43* 1.27*  CALCIUM 7.8* 8.0* 7.9* 8.1* 8.0*   GFR: Estimated Creatinine Clearance: 42.9 mL/min (A) (by C-G formula based on SCr of 1.27 mg/dL  (H)). Liver Function Tests:  Recent Labs Lab 05/10/17 0046 05/11/17 0607  AST 56* 31  ALT 35 28  ALKPHOS 183* 153*  BILITOT 3.9* 3.9*  PROT 5.5* 5.3*  ALBUMIN 2.6* 2.3*   No results for input(s): LIPASE, AMYLASE in the last 168 hours. No results for input(s): AMMONIA in the last 168 hours. Coagulation Profile:  Recent Labs Lab 05/11/17 0607  INR 1.38   Cardiac Enzymes: No results for input(s): CKTOTAL, CKMB, CKMBINDEX, TROPONINI in the last 168 hours. BNP (last 3 results) No results for input(s): PROBNP in the last 8760 hours. HbA1C: No results for input(s): HGBA1C in the last 72 hours. CBG: No results for input(s): GLUCAP in the last 168 hours. Lipid Profile: No results for input(s): CHOL, HDL, LDLCALC, TRIG, CHOLHDL, LDLDIRECT in the last 72 hours. Thyroid Function Tests: No results for input(s): TSH, T4TOTAL, FREET4, T3FREE, THYROIDAB in the last 72 hours. Anemia Panel: No results for input(s): VITAMINB12, FOLATE, FERRITIN, TIBC, IRON, RETICCTPCT in the last 72 hours. Urine analysis:    Component Value Date/Time   COLORURINE AMBER (A) 03/01/2017 1414   APPEARANCEUR CLOUDY (A) 03/01/2017 1414   LABSPEC 1.015 03/01/2017 1414   PHURINE 6.0 03/01/2017 1414   GLUCOSEU 100 (A) 03/01/2017 1414   HGBUR NEGATIVE 03/01/2017 1414   BILIRUBINUR NEGATIVE 03/01/2017 1414   KETONESUR NEGATIVE 03/01/2017 1414   PROTEINUR NEGATIVE 03/01/2017 1414   NITRITE NEGATIVE 03/01/2017 1414   LEUKOCYTESUR NEGATIVE 03/01/2017 1414   Sepsis Labs: @[email or web address] (procalcitonin:4,lacticidven:4)  ) Recent Results (from the past 240 hour(s))  Blood culture (routine x 2)     Status: None (Preliminary result)   Collection Time: 05/10/17 12:46 AM  Result Value Ref Range Status   Specimen Description BLOOD LEFT ARM  Final   Special Requests   Final    BOTTLES DRAWN AEROBIC AND ANAEROBIC Blood Culture adequate volume   Culture   Final    NO GROWTH 4 DAYS Performed at [facility],  [place] [address] [place], [place] [address]    Report Status PENDING  Incomplete  Blood culture (routine x 2)     Status: None (Preliminary result)   Collection Time: 05/10/17 12:46 AM  Result Value Ref Range Status   Specimen Description BLOOD LEFT ARM  Final   Special Requests   Final    BOTTLES DRAWN AEROBIC AND ANAEROBIC Blood Culture adequate volume   Culture   Final    NO GROWTH 4 DAYS Performed at [facility], [place] [address], [place], [place] [address]    Report Status PENDING  Incomplete         Radiology Studies: No results found.  Scheduled Meds: . dicyclomine  10 mg Oral TID AC & HS  . famotidine  20 mg Oral BID  . [START ON 05/15/2017] furosemide  40 mg Oral BID  . mometasone-formoterol  2 puff Inhalation BID  . pantoprazole  40 mg Oral BID  . predniSONE  1 mg Oral QODAY  . saccharomyces boulardii  250 mg Oral BID  . spironolactone  300 mg Oral Daily  . tiotropium  18 mcg Inhalation Q1400   Continuous Infusions: . vancomycin Stopped (05/14/17 0122)     LOS: 4 days    Time spent: 10min    Niel Hummer,  MD [facility] Pager [phone]  If 7PM-7AM, please contact night-coverage [email or web address] Password [email or web address] 05/14/2017, 4:21 PM

## 2017-05-14 NOTE — Consult Note (Signed)
[place] Nurse wound consult note Reason for Consult:Skin tear on left lateral LE Wound type:Traumatic Pressure Injury POA: N/A Measurement:4cm x 3cm x 0.2cm Wound bed:90% approximated, 10 percent skin gap Drainage (amount, consistency, odor) none Periwound:intact with 0.2cm area of periwound erythema. No warmth, no induration Dressing procedure/placement/frequency: I will ask staff to dress with an antimicrobial non-adherent dressing (xeroform) twice daily and have patient elevate at all times. [place] nursing team will not follow, but will remain available to this patient, the nursing and medical teams.  Please re-consult if needed. Thanks, Maudie Flakes, MSN, RN, [place], Ar[facility]  Pager# [phone]

## 2017-05-15 LAB — BASIC METABOLIC PANEL
ANION GAP: 7 (ref 5–15)
BUN: 12 mg/dL (ref 6–20)
CALCIUM: 8.1 mg/dL — AB (ref 8.9–10.3)
CO2: 25 mmol/L (ref 22–32)
CREATININE: 1.09 mg/dL — AB (ref 0.44–1.00)
Chloride: 100 mmol/L — ABNORMAL LOW (ref 101–111)
GFR, EST NON AFRICAN AMERICAN: 55 mL/min — AB (ref >60)
Glucose, Bld: 128 mg/dL — ABNORMAL HIGH (ref 65–99)
Potassium: 3.7 mmol/L (ref 3.5–5.1)
Sodium: 132 mmol/L — ABNORMAL LOW (ref 135–145)

## 2017-05-15 LAB — CULTURE, BLOOD (ROUTINE X 2)
CULTURE: NO GROWTH
Culture: NO GROWTH
SPECIAL REQUESTS: ADEQUATE
Special Requests: ADEQUATE

## 2017-05-15 LAB — CREATININE, SERUM
Creatinine, Ser: 1.13 mg/dL — ABNORMAL HIGH (ref 0.44–1.00)
GFR, EST NON AFRICAN AMERICAN: 53 mL/min — AB (ref >60)

## 2017-05-15 MED ORDER — MAGIC MOUTHWASH
5.0000 mL | Freq: Three times a day (TID) | ORAL | Status: DC
  Administered 2017-05-15: 5 mL via ORAL
  Filled 2017-05-15 (×2): qty 5

## 2017-05-15 MED ORDER — MAGIC MOUTHWASH: 5.0000 mL | Freq: Three times a day (TID) | ORAL | 0 refills | Status: DC

## 2017-05-15 MED ORDER — SACCHAROMYCES BOULARDII 250 MG PO CAPS: 250.0000 mg | ORAL_CAPSULE | Freq: Two times a day (BID) | ORAL | 0 refills | Status: DC

## 2017-05-15 MED ORDER — DOXYCYCLINE HYCLATE 50 MG PO CAPS: 100.0000 mg | ORAL_CAPSULE | Freq: Two times a day (BID) | ORAL | 0 refills | Status: DC

## 2017-05-15 MED ORDER — POTASSIUM CHLORIDE ER 10 MEQ PO TBCR: 20.0000 meq | EXTENDED_RELEASE_TABLET | Freq: Every day | ORAL | 0 refills | Status: DC

## 2017-05-15 MED ORDER — FUROSEMIDE 40 MG PO TABS: 40.0000 mg | ORAL_TABLET | Freq: Two times a day (BID) | ORAL | 0 refills | Status: DC

## 2017-05-15 MED ORDER — LOPERAMIDE HCL 2 MG PO CAPS
2.0000 mg | ORAL_CAPSULE | ORAL | Status: DC | PRN
  Administered 2017-05-15: 2 mg via ORAL
  Filled 2017-05-15: qty 1

## 2017-05-15 NOTE — Discharge Summary (Signed)
Physician Discharge Summary  Alicia [name] TML:[id number] DOB: [birth year] DOA: 05/09/2017  PCP: Darreld Mclean, MD  Admit date: 05/09/2017 Discharge date: 05/15/2017  Admitted From: Home  Disposition:  Home   Recommendations for Outpatient Follow-up:  1. Follow up with PCP in 1-2 weeks 2. Please obtain BMP/CBC in one week 3. Needs to follow up with primary hepatologist    Discharge Condition: stable.  CODE STATUS: full code.  Diet recommendation: Heart Healthy   Brief/Interim Summary: 56 year old female with history of congenital hepatic fibrosis now cirrhosis and portal hypertension, on a transplant list at [facility], pulmonary second sarcoidosis, adrenal insufficiency on prednisone admitted with left lower extremity cellulitis and severe volume overload/ 8/11  Assessment & Plan:    1. Left lower extremity cellulitis -Presented with fever redness pain and swelling, treated with IV vancomycin from 8/11  -Has clinically worsened on 8/12 and hence I added IV Zosyn, then change Zosyn to IV ceftriaxone since this will to cover most gram negatives except Pseudomonas, and side effects -blood cultures negative,  -diuresed well, 15 L negative, elevate left lower extremity -change to Po Abx at discharge. Discharge on doxy for 5 more days.   -redness improved.   2. Liver cirrhosis secondary to congenital hepatic fibrosis/ portal hypertension, followed by [facility] on transplant list  -now with volume overload/anasarca -She is 11 pounds above her dry weight, and clinically was profoundly volume overloaded on admission -diuresed with IV lasix 80 mg twice a day and Aldactone, she is negative 15.8L -cr stable. Resume lasix.    3. Pulmonary sarcoidosis  -Asymptomatic, follow-up with pulmonologist at [facility]   4. Mild adrenal insufficiency  -On prednisone 1 mg every other day now, continue no signs of acute adrenal insufficiency  -No indication for stress dose steroids  5. CKD  (chronic kidney disease), stage III  -Stable, monitor with diuresis  6. Chronic thrombocytopenia  -Secondary to liver cirrhosis, portal hypertension   monitor-  7-Diarrhea; C diff negative. Hold lactulose.  Received one dose of imodium. Advised patient to resume lactulose whe diarrhea improved. She needs to have 3 BM per day   Discharge Diagnoses:  Active Problems:   Sarcoidosis   Cellulitis   CKD (chronic kidney disease), stage III   Other cirrhosis of liver (HCC)   Immunocompromised patient [facility])    Discharge Instructions  Discharge Instructions    Diet - low sodium heart healthy    Complete by:  As directed    Increase activity slowly    Complete by:  As directed      Allergies as of 05/15/2017      Reactions   Aspirin Other (See Comments)   Causes Asthma   Peanut-containing Drug Products Shortness Of Breath   Asthma, eye swelling, itchy eyes, can't catch her breath   Gabapentin Other (See Comments)   Blisters in mouth, nausea    Azithromycin Nausea And Vomiting      Medication List    STOP taking these medications   cephALEXin 500 MG capsule Commonly known as:  KEFLEX     TAKE these medications   albuterol 108 (90 Base) MCG/ACT inhaler Commonly known as:  PROVENTIL HFA;VENTOLIN HFA Inhale 1-2 puffs into the lungs every 6 (six) hours as needed for wheezing or shortness of breath.   benzonatate 100 MG capsule Commonly known as:  TESSALON Take 100 mg by mouth 3 (three) times daily as needed for cough.   dicyclomine 10 MG capsule Commonly known as:  BENTYL Take 1  capsule (10 mg total) by mouth 4 (four) times daily -  before meals and at bedtime.   doxycycline 50 MG capsule Commonly known as:  VIBRAMYCIN Take 2 capsules (100 mg total) by mouth 2 (two) times daily.   DULERA 200-5 MCG/ACT Aero Generic drug:  mometasone-formoterol INHALE 2 PUFFS BY MOUTH TWO TIMES DAILY.   furosemide 40 MG tablet Commonly known as:  LASIX Take 1 tablet (40 mg total)  by mouth 2 (two) times daily. What changed:  how much to take  when to take this  additional instructions   ibuprofen 200 MG tablet Commonly known as:  ADVIL,MOTRIN Take 400 mg by mouth every 6 (six) hours as needed for headache, mild pain or moderate pain.   ipratropium-albuterol 0.5-2.5 (3) MG/3ML Soln Commonly known as:  DUONEB TAKE 3 MLS BY NEBULIZATION EVERY 4 (FOUR) HOURS AS NEEDED.   lactulose 10 GM/15ML solution Commonly known as:  CHRONULAC Take 30 g by mouth 2 (two) times daily.   magic mouthwash Soln Take 5 mLs by mouth 3 (three) times daily.   ondansetron 4 MG tablet Commonly known as:  ZOFRAN Take 1 tablet (4 mg total) by mouth 2 (two) times daily as needed for nausea or vomiting.   oxyCODONE 5 MG immediate release tablet Commonly known as:  Oxy IR/ROXICODONE Take 1 tablet (5 mg total) by mouth every 4 (four) hours as needed.   pantoprazole 40 MG tablet Commonly known as:  PROTONIX Take 1 tablet by mouth 2 (two) times daily. Before breakfast & before supper   potassium chloride 10 MEQ tablet Commonly known as:  K-DUR Take 2 tablets (20 mEq total) by mouth daily. What changed:  how much to take  when to take this   predniSONE 1 MG tablet Commonly known as:  DELTASONE Take 1 mg by mouth every other day.   ranitidine 300 MG tablet Commonly known as:  ZANTAC TAKE 1 TABLET AT BEDTIME   saccharomyces boulardii 250 MG capsule Commonly known as:  FLORASTOR Take 1 capsule (250 mg total) by mouth 2 (two) times daily.   SPIRIVA RESPIMAT 2.5 MCG/ACT Aers Generic drug:  Tiotropium Bromide Monohydrate Inhale 2 puffs into the lungs daily at 2 PM.   spironolactone 100 MG tablet Commonly known as:  ALDACTONE Take 300 mg by mouth daily.      Follow-up Information    Copland, Gay Filler, MD.   Specialty:  Family Medicine Contact information: [address] [place] [address] [phone]          Allergies  Allergen Reactions   . Aspirin Other (See Comments)    Causes Asthma  . Peanut-Containing Drug Products Shortness Of Breath    Asthma, eye swelling, itchy eyes, can't catch her breath  . Gabapentin Other (See Comments)    Blisters in mouth, nausea   . Azithromycin Nausea And Vomiting    Consultations:  none   Procedures/Studies: Dg Tibia/fibula Left  Result Date: 05/10/2017 CLINICAL DATA:  Acute onset of left leg swelling and erythema. Fever. Initial encounter. EXAM: LEFT TIBIA AND FIBULA - 2 VIEW COMPARISON:  None. FINDINGS: There is no evidence of fracture or dislocation. The tibia and fibula appear grossly intact. The knee joint is grossly unremarkable in appearance. No knee joint effusion is identified. Diffuse soft tissue swelling is noted about the left lower leg, without definite evidence of soft tissue air. IMPRESSION: No evidence of fracture or dislocation. Electronically Signed   By: Francoise Schaumann.D.  On: 05/10/2017 01:38       Subjective: Left LE redness improved.  Diarrhea persist  Discharge Exam: Vitals:   05/15/17 0610 05/15/17 0820  BP: (!) 118/58 118/64  Pulse: 80   Resp: 20   Temp: 98.4 F (36.9 C)   SpO2: 97%    Vitals:   05/14/17 2210 05/15/17 0610 05/15/17 0618 05/15/17 0820  BP: 134/68 (!) 118/58  118/64  Pulse: 88 80    Resp:  20    Temp: 98.4 F (36.9 C) 98.4 F (36.9 C)    TempSrc: Oral Oral    SpO2: 96% 97%    Weight:   61.9 kg (136 lb 7.4 oz)   Height:        General: Pt is alert, awake, not in acute distress Cardiovascular: RRR, S1/S2 +, no rubs, no gallops Respiratory: CTA bilaterally, no wheezing, no rhonchi Abdominal: Soft, NT, ND, bowel sounds + Extremities: no edema, no cyanosis    The results of significant diagnostics from this hospitalization (including imaging, microbiology, ancillary and laboratory) are listed below for reference.     Microbiology: Recent Results (from the past 240 hour(s))  Blood culture (routine x 2)     Status:  None   Collection Time: 05/10/17 12:46 AM  Result Value Ref Range Status   Specimen Description BLOOD LEFT ARM  Final   Special Requests   Final    BOTTLES DRAWN AEROBIC AND ANAEROBIC Blood Culture adequate volume   Culture   Final    NO GROWTH 5 DAYS Performed at [facility], [address] [place], [place] [address]    Report Status 05/15/2017 FINAL  Final  Blood culture (routine x 2)     Status: None   Collection Time: 05/10/17 12:46 AM  Result Value Ref Range Status   Specimen Description BLOOD LEFT ARM  Final   Special Requests   Final    BOTTLES DRAWN AEROBIC AND ANAEROBIC Blood Culture adequate volume   Culture   Final    NO GROWTH 5 DAYS Performed at [facility], [place] [address], [place], [place] [address]    Report Status 05/15/2017 FINAL  Final  C difficile quick scan w PCR reflex     Status: None   Collection Time: 05/14/17  7:37 PM  Result Value Ref Range Status   C Diff antigen NEGATIVE NEGATIVE Final   C Diff toxin NEGATIVE NEGATIVE Final   C Diff interpretation No C. difficile detected.  Final     Labs: BNP (last 3 results)  Recent Labs  05/29/16 1735 03/01/17 2107  BNP 28.9 23.5   Basic Metabolic Panel:  Recent Labs Lab 05/11/17 0607 05/12/17 0548 05/13/17 0558 05/14/17 0551 05/15/17 0722 05/15/17 0737  NA 131* 132* 134* 131*  --  132*  K 4.8 4.9 4.5 3.8  --  3.7  CL 102 100* 98* 98*  --  100*  CO2 25 25 28 27   --  25  GLUCOSE 202* 226* 131* 193*  --  128*  BUN 13 13 15 15   --  12  CREATININE 0.98 1.07* 1.43* 1.27* 1.13* 1.09*  CALCIUM 8.0* 7.9* 8.1* 8.0*  --  8.1*   Liver Function Tests:  Recent Labs Lab 05/10/17 0046 05/11/17 0607  AST 56* 31  ALT 35 28  ALKPHOS 183* 153*  BILITOT 3.9* 3.9*  PROT 5.5* 5.3*  ALBUMIN 2.6* 2.3*   No results for input(s): LIPASE, AMYLASE in the last 168 hours. No  results for input(s): AMMONIA in the last 168 hours. CBC:  Recent Labs Lab 05/10/17 0046 05/11/17 0607 05/12/17 0548  05/13/17 0558  WBC 12.3* 11.5* 7.7 6.1  NEUTROABS 10.2*  --   --   --   HGB 10.7* 10.1* 9.9* 10.0*  HCT 31.1* 28.3* 28.1* 29.5*  MCV 101.3* 98.6 98.9 98.0  PLT 94* 68* 71* 80*   Cardiac Enzymes: No results for input(s): CKTOTAL, CKMB, CKMBINDEX, TROPONINI in the last 168 hours. BNP: Invalid input(s): POCBNP CBG: No results for input(s): GLUCAP in the last 168 hours. D-Dimer No results for input(s): DDIMER in the last 72 hours. Hgb A1c No results for input(s): HGBA1C in the last 72 hours. Lipid Profile No results for input(s): CHOL, HDL, LDLCALC, TRIG, CHOLHDL, LDLDIRECT in the last 72 hours. Thyroid function studies No results for input(s): TSH, T4TOTAL, T3FREE, THYROIDAB in the last 72 hours.  Invalid input(s): FREET3 Anemia work up No results for input(s): VITAMINB12, FOLATE, FERRITIN, TIBC, IRON, RETICCTPCT in the last 72 hours. Urinalysis    Component Value Date/Time   COLORURINE AMBER (A) 03/01/2017 1414   APPEARANCEUR CLOUDY (A) 03/01/2017 1414   LABSPEC 1.015 03/01/2017 1414   PHURINE 6.0 03/01/2017 1414   GLUCOSEU 100 (A) 03/01/2017 1414   HGBUR NEGATIVE 03/01/2017 1414   BILIRUBINUR NEGATIVE 03/01/2017 1414   KETONESUR NEGATIVE 03/01/2017 1414   PROTEINUR NEGATIVE 03/01/2017 1414   NITRITE NEGATIVE 03/01/2017 1414   LEUKOCYTESUR NEGATIVE 03/01/2017 1414   Sepsis Labs Invalid input(s): PROCALCITONIN,  WBC,  LACTICIDVEN Microbiology Recent Results (from the past 240 hour(s))  Blood culture (routine x 2)     Status: None   Collection Time: 05/10/17 12:46 AM  Result Value Ref Range Status   Specimen Description BLOOD LEFT ARM  Final   Special Requests   Final    BOTTLES DRAWN AEROBIC AND ANAEROBIC Blood Culture adequate volume   Culture   Final    NO GROWTH 5 DAYS Performed at [facility], [address], [place], [place] [address]    Report Status 05/15/2017 FINAL  Final  Blood culture (routine x 2)     Status: None   Collection Time: 05/10/17  12:46 AM  Result Value Ref Range Status   Specimen Description BLOOD LEFT ARM  Final   Special Requests   Final    BOTTLES DRAWN AEROBIC AND ANAEROBIC Blood Culture adequate volume   Culture   Final    NO GROWTH 5 DAYS Performed at [facility], [place] [address] [place], [place] [address]    Report Status 05/15/2017 FINAL  Final  C difficile quick scan w PCR reflex     Status: None   Collection Time: 05/14/17  7:37 PM  Result Value Ref Range Status   C Diff antigen NEGATIVE NEGATIVE Final   C Diff toxin NEGATIVE NEGATIVE Final   C Diff interpretation No C. difficile detected.  Final     Time coordinating discharge: Over 30 minutes  SIGNED:   Elmarie Shiley, MD  [facility] 05/15/2017, 10:47 AM Pager [phone]  If 7PM-7AM, please contact night-coverage [email or web address] Password [email or web address]

## 2017-05-16 ENCOUNTER — Telehealth: Payer: Self-pay | Admitting: Behavioral Health

## 2017-05-16 NOTE — Telephone Encounter (Signed)
Patient voiced that she's getting better, but trying to rest & get her strength back. Hospital Follow-up appointment has been scheduled for 05/22/17 at 11:30 AM with Dr. Lorelei Pont.

## 2017-05-17 ENCOUNTER — Encounter (HOSPITAL_COMMUNITY): Payer: Self-pay | Admitting: Emergency Medicine

## 2017-05-17 ENCOUNTER — Emergency Department (HOSPITAL_COMMUNITY)
Admission: EM | Admit: 2017-05-17 | Discharge: 2017-05-18 | Disposition: A | Discharge status: Home / Self Care | Payer: BLUE CROSS/BLUE SHIELD | Attending: Emergency Medicine | Admitting: Emergency Medicine

## 2017-05-17 DIAGNOSIS — R1013 Epigastric pain: Secondary | ICD-10-CM | POA: Insufficient documentation

## 2017-05-17 DIAGNOSIS — J45909 Unspecified asthma, uncomplicated: Secondary | ICD-10-CM | POA: Diagnosis not present

## 2017-05-17 NOTE — ED Notes (Signed)
Bed: WA09 Expected date:  Expected time:  Means of arrival:  Comments: 

## 2017-05-17 NOTE — ED Triage Notes (Addendum)
Per EMS , pt. From home with complaint of abdominal pain at 10/10 after eating some [place] food which she admitted that it aggravated her gall bladder. Pt. Has hx of multiple GI problem, is awaiting for liver transplant from [place]. Reported of nausea with vomiting x 2, denied diarrhea. Received a total of 142mcg of IV fentanyl and 4 IV Zofran. Alert and oriented x3, afebrile.

## 2017-05-18 ENCOUNTER — Emergency Department (HOSPITAL_COMMUNITY): Payer: BLUE CROSS/BLUE SHIELD

## 2017-05-18 LAB — CBC WITH DIFFERENTIAL/PLATELET
Basophils Absolute: 0 10*3/uL (ref 0.0–0.1)
Basophils Relative: 0 %
EOS ABS: 0 10*3/uL (ref 0.0–0.7)
Eosinophils Relative: 0 %
HCT: 30.9 % — ABNORMAL LOW (ref 36.0–46.0)
HEMOGLOBIN: 10.6 g/dL — AB (ref 12.0–15.0)
LYMPHS ABS: 2.2 10*3/uL (ref 0.7–4.0)
LYMPHS PCT: 17 %
MCH: 33.8 pg (ref 26.0–34.0)
MCHC: 34.3 g/dL (ref 30.0–36.0)
MCV: 98.4 fL (ref 78.0–100.0)
MONOS PCT: 6 %
Monocytes Absolute: 0.7 10*3/uL (ref 0.1–1.0)
NEUTROS PCT: 78 %
Neutro Abs: 10.2 10*3/uL — ABNORMAL HIGH (ref 1.7–7.7)
Platelets: 86 10*3/uL — ABNORMAL LOW (ref 150–400)
RBC: 3.14 MIL/uL — AB (ref 3.87–5.11)
RDW: 15.2 % (ref 11.5–15.5)
WBC: 13.2 10*3/uL — ABNORMAL HIGH (ref 4.0–10.5)

## 2017-05-18 LAB — COMPREHENSIVE METABOLIC PANEL
ALBUMIN: 2.2 g/dL — AB (ref 3.5–5.0)
ALT: 71 U/L — ABNORMAL HIGH (ref 14–54)
AST: 90 U/L — ABNORMAL HIGH (ref 15–41)
Alkaline Phosphatase: 210 U/L — ABNORMAL HIGH (ref 38–126)
Anion gap: 9 (ref 5–15)
BUN: 16 mg/dL (ref 6–20)
CALCIUM: 8.1 mg/dL — AB (ref 8.9–10.3)
CO2: 22 mmol/L (ref 22–32)
CREATININE: 1.16 mg/dL — AB (ref 0.44–1.00)
Chloride: 101 mmol/L (ref 101–111)
GFR calc non Af Amer: 51 mL/min — ABNORMAL LOW (ref >60)
GFR, EST AFRICAN AMERICAN: 59 mL/min — AB (ref >60)
Glucose, Bld: 260 mg/dL — ABNORMAL HIGH (ref 65–99)
Potassium: 3.9 mmol/L (ref 3.5–5.1)
SODIUM: 132 mmol/L — AB (ref 135–145)
Total Bilirubin: 3.8 mg/dL — ABNORMAL HIGH (ref 0.3–1.2)
Total Protein: 5.2 g/dL — ABNORMAL LOW (ref 6.5–8.1)

## 2017-05-18 LAB — LIPASE, BLOOD: Lipase: 38 U/L (ref 11–51)

## 2017-05-18 MED ORDER — PANTOPRAZOLE SODIUM 40 MG IV SOLR
40.0000 mg | Freq: Once | INTRAVENOUS | Status: AC
  Administered 2017-05-18: 40 mg via INTRAVENOUS
  Filled 2017-05-18: qty 40

## 2017-05-18 MED ORDER — FENTANYL CITRATE (PF) 100 MCG/2ML IJ SOLN
50.0000 ug | Freq: Once | INTRAMUSCULAR | Status: AC
  Administered 2017-05-18: 50 ug via INTRAVENOUS
  Filled 2017-05-18: qty 2

## 2017-05-18 MED ORDER — OXYCODONE HCL 5 MG PO TABS
5.0000 mg | ORAL_TABLET | ORAL | 0 refills | Status: DC | PRN
Start: 2017-05-18 — End: 2017-07-17

## 2017-05-18 MED ORDER — SUCRALFATE 1 G PO TABS
1.0000 g | ORAL_TABLET | Freq: Once | ORAL | Status: AC
  Administered 2017-05-18: 1 g via ORAL
  Filled 2017-05-18: qty 1

## 2017-05-18 MED ORDER — SODIUM CHLORIDE 0.9 % IV SOLN
Freq: Once | INTRAVENOUS | Status: AC
  Administered 2017-05-18: 125 mL/h via INTRAVENOUS

## 2017-05-18 NOTE — ED Provider Notes (Signed)
[facility] DEPT Provider Note: [name], MD, FACEP  CSN: [id number] MRN: [id number] ARRIVAL: 05/17/17 at [place]: [id number]   CHIEF COMPLAINT  Abdominal Pain   HISTORY OF PRESENT ILLNESS  05/18/17 12:09 AM Alicia [name] is a 56 y.o. 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 She also has known gallstones. She was recently admitted for cellulitis of the left leg and was discharged home on doxycycline 3 days ago. She developed diarrhea while in the hospital but this has improved. She tested negative for Clostridium difficile.   She is here with epigastric abdominal pain that began about 9 PM yesterday evening after eating [place] food. The pain was severe and she describes it as a twisting, cramping sensation. She has had similar episodes in the past but they were not this severe and resolved on their own. Her pain was associated with nausea and vomiting but no diarrhea. She also notes that her abdomen is distended. She was given Zofran and fentanyl IV by EMS prior to arrival with improvement in her symptoms.   Past Medical History:  Diagnosis Date  . Allergy   . Anemia   . Asthma   . Cellulitis   . GERD (gastroesophageal reflux disease)   . Hepatic fibrosis    congenital  . History of UTI    chronic as a child  . Sarcoidosis   . Spleen enlarged     Past Surgical History:  Procedure Laterality Date  . COLONOSCOPY    . CYST EXCISION Left 05/01/2017   Procedure: LEFT INDEX FINGER CYST EXCISION;  Surgeon: Leandrew Koyanagi, MD;  Location: [facility];  Service: Orthopedics;  Laterality: Left;  . ESOPHAGOGASTRODUODENOSCOPY    . SPINAL FUSION  12/2007   Double spinal Fusion  . VIDEO BRONCHOSCOPY Bilateral 12/08/2015   Procedure: VIDEO BRONCHOSCOPY WITH FLUORO;  Surgeon: Rigoberto Noel, MD;  Location: [facility];  Service: Cardiopulmonary;  Laterality: Bilateral;    Family History  Problem Relation Age of Onset  . Arthritis Mother   .  Heart disease Father        Triple bypass  . Migraines Father   . Heart attack Paternal Grandmother   . Stroke Paternal Grandfather   . Liver disease Sister        Juvenile cysitc liver disease  . Asthma Sister     Social History  Substance Use Topics  . Smoking status: Never Smoker  . Smokeless tobacco: Never Used  . Alcohol use No    Prior to Admission medications   Medication Sig Start Date End Date Taking? Authorizing Provider  albuterol (PROVENTIL HFA;VENTOLIN HFA) 108 (90 Base) MCG/ACT inhaler Inhale 1-2 puffs into the lungs every 6 (six) hours as needed for wheezing or shortness of breath. 10/19/16  Yes Palumbo, April, MD  benzonatate (TESSALON) 100 MG capsule Take 100 mg by mouth 3 (three) times daily as needed for cough.  01/30/17  Yes [provider]  dicyclomine (BENTYL) 10 MG capsule Take 1 capsule (10 mg total) by mouth 4 (four) times daily -  before meals and at bedtime. 05/08/17  Yes Shelda Pal, DO  doxycycline (VIBRAMYCIN) 50 MG capsule Take 2 capsules (100 mg total) by mouth 2 (two) times daily. 05/15/17  Yes Regalado, Belkys A, MD  DULERA 200-5 MCG/ACT AERO INHALE 2 PUFFS BY MOUTH TWO TIMES DAILY. 03/17/17  Yes Rigoberto Noel, MD  furosemide (LASIX) 40 MG tablet Take 1 tablet (40 mg total)  by mouth 2 (two) times daily. 05/15/17  Yes Regalado, Belkys A, MD  ibuprofen (ADVIL,MOTRIN) 200 MG tablet Take 400 mg by mouth every 6 (six) hours as needed for headache, mild pain or moderate pain.   Yes [provider]  ipratropium-albuterol (DUONEB) 0.5-2.5 (3) MG/3ML SOLN TAKE 3 MLS BY NEBULIZATION EVERY 4 (FOUR) HOURS AS NEEDED. 11/18/16  Yes Copland, Gay Filler, MD  lactulose (CHRONULAC) 10 GM/15ML solution Take 30 g by mouth 2 (two) times daily.  06/27/16  Yes [provider]  magic mouthwash SOLN Take 5 mLs by mouth 3 (three) times daily. 05/15/17  Yes Regalado, Belkys A, MD  oxyCODONE (OXY IR/ROXICODONE) 5 MG immediate release tablet Take 1 tablet  (5 mg total) by mouth every 4 (four) hours as needed. Patient taking differently: Take 5 mg by mouth every 4 (four) hours as needed for moderate pain or severe pain.  05/01/17  Yes Leandrew Koyanagi, MD  pantoprazole (PROTONIX) 40 MG tablet Take 1 tablet by mouth 2 (two) times daily. Before breakfast & before supper 04/05/11  Yes [provider]  potassium chloride (K-DUR) 10 MEQ tablet Take 2 tablets (20 mEq total) by mouth daily. 05/15/17  Yes Regalado, Belkys A, MD  predniSONE (DELTASONE) 1 MG tablet Take 1 mg by mouth every other day.  01/27/17  Yes [provider]  ranitidine (ZANTAC) 300 MG tablet TAKE 1 TABLET AT BEDTIME 12/20/16  Yes Rigoberto Noel, MD  saccharomyces boulardii (FLORASTOR) 250 MG capsule Take 1 capsule (250 mg total) by mouth 2 (two) times daily. 05/15/17  Yes Regalado, Belkys A, MD  SPIRIVA RESPIMAT 2.5 MCG/ACT AERS Inhale 2 puffs into the lungs daily at 2 PM.  01/27/17  Yes [provider]  spironolactone (ALDACTONE) 100 MG tablet Take 300 mg by mouth daily.    Yes [provider]  ondansetron (ZOFRAN) 4 MG tablet Take 1 tablet (4 mg total) by mouth 2 (two) times daily as needed for nausea or vomiting. Patient not taking: Reported on 05/18/2017 05/08/17   Shelda Pal, DO    Allergies Aspirin; Peanut-containing drug products; Gabapentin; and Azithromycin   REVIEW OF SYSTEMS  Negative except as noted here or in the History of Present Illness.   PHYSICAL EXAMINATION  Initial Vital Signs Blood pressure 126/72, pulse 76, temperature 98.9 F (37.2 C), temperature source Oral, resp. rate 16, height 5\' 2"  (1.575 m), weight 63.5 kg (140 lb), SpO2 100 %.  Examination General: Well-developed, well-nourished female in no acute distress; appearance consistent with age of record HENT: normocephalic; atraumatic Eyes: pupils equal, round and reactive to light; extraocular muscles intact; left medial subconjunctival hemorrhage Neck:  supple Heart: regular rate and rhythm Lungs: clear to auscultation bilaterally Abdomen: soft; distended; epigastric tenderness; hepatomegaly; bowel sounds hypoactive Extremities: No deformity; full range of motion; pulses normal; trace edema of lower legs Neurologic: Awake, alert and oriented; motor function intact in all extremities and symmetric; no facial droop Skin: Warm and dry; multiple ecchymoses most prominent on the upper extremities Psychiatric: Normal mood and affect   RESULTS  Summary of this visit's results, reviewed by myself:   EKG Interpretation  Date/Time:    Ventricular Rate:    PR Interval:    QRS Duration:   QT Interval:    QTC Calculation:   R Axis:     Text Interpretation:        Laboratory Studies: Results for orders placed or performed during the hospital encounter of 05/17/17 (from the past  24 hour(s))  Comprehensive metabolic panel     Status: Abnormal   Collection Time: 05/18/17 12:21 AM  Result Value Ref Range   Sodium 132 (L) 135 - 145 mmol/L   Potassium 3.9 3.5 - 5.1 mmol/L   Chloride 101 101 - 111 mmol/L   CO2 22 22 - 32 mmol/L   Glucose, Bld 260 (H) 65 - 99 mg/dL   BUN 16 6 - 20 mg/dL   Creatinine, Ser 1.16 (H) 0.44 - 1.00 mg/dL   Calcium 8.1 (L) 8.9 - 10.3 mg/dL   Total Protein 5.2 (L) 6.5 - 8.1 g/dL   Albumin 2.2 (L) 3.5 - 5.0 g/dL   AST 90 (H) 15 - 41 U/L   ALT 71 (H) 14 - 54 U/L   Alkaline Phosphatase 210 (H) 38 - 126 U/L   Total Bilirubin 3.8 (H) 0.3 - 1.2 mg/dL   GFR calc non Af Amer 51 (L) >60 mL/min   GFR calc Af Amer 59 (L) >60 mL/min   Anion gap 9 5 - 15  Lipase, blood     Status: None   Collection Time: 05/18/17 12:21 AM  Result Value Ref Range   Lipase 38 11 - 51 U/L  CBC with Differential/Platelet     Status: Abnormal   Collection Time: 05/18/17 12:21 AM  Result Value Ref Range   WBC 13.2 (H) 4.0 - 10.5 K/uL   RBC 3.14 (L) 3.87 - 5.11 MIL/uL   Hemoglobin 10.6 (L) 12.0 - 15.0 g/dL   HCT 30.9 (L) 36.0 - 46.0 %   MCV  98.4 78.0 - 100.0 fL   MCH 33.8 26.0 - 34.0 pg   MCHC 34.3 30.0 - 36.0 g/dL   RDW 15.2 11.5 - 15.5 %   Platelets 86 (L) 150 - 400 K/uL   Neutrophils Relative % 78 %   Neutro Abs 10.2 (H) 1.7 - 7.7 K/uL   Lymphocytes Relative 17 %   Lymphs Abs 2.2 0.7 - 4.0 K/uL   Monocytes Relative 6 %   Monocytes Absolute 0.7 0.1 - 1.0 K/uL   Eosinophils Relative 0 %   Eosinophils Absolute 0.0 0.0 - 0.7 K/uL   Basophils Relative 0 %   Basophils Absolute 0.0 0.0 - 0.1 K/uL   Imaging Studies: US Abdomen Complete  Result Date: 05/18/2017 CLINICAL DATA:  Epigastric pain with nausea vomiting and kidney disease EXAM: ABDOMEN ULTRASOUND COMPLETE COMPARISON:  03/13/2017 FINDINGS: Gallbladder: Small stones measuring up to 1.5 cm. Borderline wall thickness at 3.3 mm. Negative sonographic Murphy. Common bile duct: Diameter: Borderline enlarged at 7.3 mm Liver: Coarse echogenic liver consistent with hepatocellular disease. Portal vein is patent on color Doppler imaging with slow flow and slight pulsatile waveform suggesting portal hypertension. IVC: No abnormality visualized. Pancreas: Visualized portion unremarkable. Spleen: Enlarged with volume of 515 cubic cm. Right Kidney: Length: 11.2 cm. Echogenicity within normal limits. No mass or hydronephrosis visualized. Left Kidney: Length: 10.5 cm. Echogenicity within normal limits. No mass or hydronephrosis visualized. Abdominal aorta: No aneurysm visualized. Other findings: None. IMPRESSION: 1. Gallstones with borderline wall thickening but negative for focal tenderness. Negative for biliary dilatation 2. Coarse heterogeneous liver consistent with hepatocellular disease. 3. Slow slightly pulsatile flow in the portal vein with enlarged spleen consistent with portal hypertension. Electronically Signed   By: Donavan Foil M.D.   On: 05/18/2017 03:56   Dg Abd Acute W/chest  Result Date: 05/18/2017 CLINICAL DATA:  Acute onset of upper abdominal pain and distention. Nausea and  vomiting. Initial  encounter. EXAM: DG ABDOMEN ACUTE W/ 1V CHEST COMPARISON:  CT of the abdomen and pelvis performed 03/13/2017, and chest radiograph performed 03/05/2017 FINDINGS: The lungs are well-aerated and clear. There is no evidence of focal opacification, pleural effusion or pneumothorax. The cardiomediastinal silhouette is within normal limits. The visualized bowel gas pattern is unremarkable. Scattered stool and air are seen within the colon; there is no evidence of small bowel dilatation to suggest obstruction. No free intra-abdominal air is identified on the provided upright view. No acute osseous abnormalities are seen; the sacroiliac joints are unremarkable in appearance. Cervical spinal fusion hardware is partially imaged. Gallstones are again noted. IMPRESSION: 1. Unremarkable bowel gas pattern; no free intra-abdominal air seen. Moderate amount of stool noted in the colon. 2. No acute cardiopulmonary process seen. 3. Cholelithiasis. Electronically Signed   By: Garald Balding M.D.   On: 05/18/2017 00:54    ED COURSE  Nursing notes and initial vitals signs, including pulse oximetry, reviewed.  Vitals:   05/17/17 2319 05/17/17 2321 05/18/17 0000 05/18/17 0233  BP:  126/72 128/76 116/76  Pulse:  76 85 79  Resp:  16  16  Temp:  98.9 F (37.2 C)    TempSrc:  Oral    SpO2: 100% 100% 99% 100%  Weight:  63.5 kg (140 lb)    Height:  5\' 2"  (1.575 m)     4:09 AM She is still having epigastric pain and tenderness but not as severe as her acute episode yesterday evening. Ultrasound is equivocal for cholecystitis and lack of a sonographic Murphy's sign argues against acute cholecystitis. Also her pain and tenderness or epigastric and not right upper quadrant which could represent gastritis as opposed to biliary colic, especially as she recently started taking doxycycline which can be irritating to the stomach. She is already on Protonix daily. She has been told in the past she is a poor surgical  candidate for cholecystectomy due to her significant comorbidities. She would like to go home at this time. She will contact her physician at [facility] where she is on the transplant list. She states she has adequate antiemetics at home.   PROCEDURES    ED DIAGNOSES     ICD-10-CM   1. Epigastric pain R10.13        Dacoda Finlay, Jenny Reichmann, MD 05/18/17 [phone]

## 2017-05-20 ENCOUNTER — Ambulatory Visit (INDEPENDENT_AMBULATORY_CARE_PROVIDER_SITE_OTHER): Payer: BLUE CROSS/BLUE SHIELD | Admitting: Orthopaedic Surgery

## 2017-05-20 DIAGNOSIS — M67442 Ganglion, left hand: Secondary | ICD-10-CM

## 2017-05-20 NOTE — Progress Notes (Signed)
[facility] at [facility] [address] [place], [place] [address] [phone]  Date:  05/22/2017   Name:  Alicia [name]   DOB:  03-May-1961   MRN:  [id number]  PCP:  Darreld Mclean, MD    Chief Complaint: Hospitalization Follow-up (Pt states she was seen a secondary time for her Gallbladder. States there's no scheduled removal date due to liver issues ) and Hepatic Disease   History of Present Illness:  Alicia [name] is a 56 y.o. very pleasant female patient who presents with the following:  Hospital follow-up visit today Alicia [name] is a very nice lady who unfortunately has congenital hepatic fibrosis which has more recently become very serious she is awaiting a transplant at [facility] She was recetnly inpt as follows, was also in the ER on 8/18- was evaluated for abd pain and released to home:  Admit date: 05/09/2017 Discharge date: 05/15/2017  Recommendations for Outpatient Follow-up:  1. Follow up with PCP in 1-2 weeks 2. Please obtain BMP/CBC in one week 3. Needs to follow up with primary hepatologist   Brief/Interim Summary: 56 year old female with history of congenital hepatic fibrosis now cirrhosis and portal hypertension, on a transplant list at [facility], pulmonary second sarcoidosis, adrenal insufficiency on prednisone admitted with left lower extremity cellulitis and severe volume overload/ 8/11  Assessment & Plan:  1. Left lower extremity cellulitis -Presented with fever redness pain and swelling, treated with IV vancomycin from 8/11  -Has clinically worsened on 8/12 and hence I added IV Zosyn, thenchange Zosyn to IV ceftriaxone since this will to cover most gram negatives except Pseudomonas, and side effects -blood cultures negative,  -diuresed well, 15 L negative, elevate left lower extremity -change to Po Abx at discharge. Discharge on doxy for 5 more days.   -redness improved.   2. Liver cirrhosis secondary  to congenital hepatic fibrosis/portal hypertension, followed by [facility] on transplant list  -now with volume overload/anasarca -She is 11 pounds above her dry weight, and clinically was profoundly volume overloaded on admission -diuresed with IV lasix 80 mg twice a day and Aldactone, she is negative 15.8L -cr stable. Resume lasix.    3. Pulmonary sarcoidosis  -Asymptomatic, follow-up with pulmonologist at [facility]   4. Mild adrenal insufficiency  -On prednisone 1 mg every other day now, continue no signs of acute adrenal insufficiency  -No indication for stress dose steroids  5. CKD (chronic kidney disease), stage III -Stable, monitor with diuresis  6. Chronic thrombocytopenia  -Secondary to liver cirrhosis, portal hypertension  monitor-  7-Diarrhea;C diff negative. Hold lactulose.  Received one dose of imodium. Advised patient to resume lactulose whe diarrhea improved. She needs to have 3 BM per day   Discharge Diagnoses:  Active Problems:   Sarcoidosis   Cellulitis   CKD (chronic kidney disease), stage III   Other cirrhosis of liver (HCC)   Immunocompromised patient ([place])  She has been moved up on the transplant list at [facility] and is hoping that she will be able to get a liver sooner She is trying to eat protein, but has to avoid any possible l[place]ia foods such as deli meats or soft eggs She is now off her abx- finished the doxycycline She had bad diarrhea in the hospital, but tested negative for C diff.  Her diarrhea is now much better- in face she will alternate between loose stools and hard pellet type stools She is not yet back on her lactulose; she  wonders when she should start back on this  She is being seen at [facility] in 2 weeks.  They are her main hepatology clinic- she only sees [facility] for transplant concerns   BP Readings from Last 3 Encounters:  05/22/17 (!) 118/50  05/18/17 111/64  05/15/17 118/64    Patient Active Problem List   Diagnosis Date  Noted  . Other cirrhosis of liver ([place]) 05/10/2017  . Immunocompromised patient ([place]) 05/10/2017  . Mucous cyst of digit of left hand 04/11/2017  . Cellulitis, abdominal wall 03/01/2017  . Normocytic anemia 03/01/2017  . Hypokalemia 03/01/2017  . CKD (chronic kidney disease), stage III 03/01/2017  . Abdominal wall cellulitis 03/01/2017  . Adrenal insufficiency ([place]) 03/01/2017  . Hyperammonemia ([place])   . Cellulitis 06/28/2016  . Thrombocytopenia ([place]) 06/28/2016  . AKI (acute kidney injury) ([place]) 06/28/2016  . Multiple pulmonary nodules determined by computed tomography of lung   . Chronic cough 09/07/2015  . Hypertension, portal ([place]) 09/04/2011  . Asthma with chronic obstructive pulmonary disease (COPD) ([place]) 09/04/2011  . 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 GERD (gastroesophageal reflux disease) 07/04/2011  . Sarcoidosis 07/01/2011  . Asthma, severe persistent, with upper airway instability 06/13/2011  . 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 Allergy   . Anemia   . Asthma   . Cellulitis   . GERD (gastroesophageal reflux disease)   . Hepatic fibrosis    congenital  . History of UTI    chronic as a child  . Sarcoidosis   . Spleen enlarged     Past Surgical History:  Procedure Laterality Date  . COLONOSCOPY    . CYST EXCISION Left 05/01/2017   Procedure: LEFT INDEX FINGER CYST EXCISION;  Surgeon: Leandrew Koyanagi, MD;  Location: [facility];  Service: Orthopedics;  Laterality: Left;  . ESOPHAGOGASTRODUODENOSCOPY    . SPINAL FUSION  12/2007   Double spinal Fusion  . VIDEO BRONCHOSCOPY Bilateral 12/08/2015   Procedure: VIDEO BRONCHOSCOPY WITH FLUORO;  Surgeon: Rigoberto Noel, MD;  Location: [facility];  Service: Cardiopulmonary;  Laterality: Bilateral;    Social History  Substance Use Topics  . Smoking status: Never Smoker  . Smokeless tobacco: Never Used  . Alcohol use No    Family History  Problem Relation Age of Onset  .  Arthritis Mother   . Heart disease Father        Triple bypass  . Migraines Father   . Heart attack Paternal Grandmother   . Stroke Paternal Grandfather   . Liver disease Sister        Juvenile cysitc liver disease  . Asthma Sister     Allergies  Allergen Reactions  . Aspirin Other (See Comments)    Causes Asthma  . Peanut-Containing Drug Products Shortness Of Breath    Asthma, eye swelling, itchy eyes, can't catch her breath  . Gabapentin Other (See Comments)    Blisters in mouth, nausea   . Azithromycin Nausea And Vomiting    Medication list has been reviewed and updated.  Current Outpatient Prescriptions on File Prior to Visit  Medication Sig Dispense Refill  . albuterol (PROVENTIL HFA;VENTOLIN HFA) 108 (90 Base) MCG/ACT inhaler Inhale 1-2 puffs into the lungs every 6 (six) hours as needed for wheezing or shortness of breath. 1 Inhaler 0  . benzonatate (TESSALON) 100 MG capsule Take 100 mg by mouth 3 (three) times daily as needed for cough.   2  . DULERA 200-5  MCG/ACT AERO INHALE 2 PUFFS BY MOUTH TWO TIMES DAILY. 13 Inhaler 0  . furosemide (LASIX) 40 MG tablet Take 1 tablet (40 mg total) by mouth 2 (two) times daily. 30 tablet 0  . ipratropium-albuterol (DUONEB) 0.5-2.5 (3) MG/3ML SOLN TAKE 3 MLS BY NEBULIZATION EVERY 4 (FOUR) HOURS AS NEEDED. 360 mL 0  . lactulose (CHRONULAC) 10 GM/15ML solution Take 30 g by mouth 2 (two) times daily.     . magic mouthwash SOLN Take 5 mLs by mouth 3 (three) times daily. 60 mL 0  . oxyCODONE (OXY IR/ROXICODONE) 5 MG immediate release tablet Take 1 tablet (5 mg total) by mouth every 4 (four) hours as needed (for pain). 12 tablet 0  . pantoprazole (PROTONIX) 40 MG tablet Take 1 tablet by mouth 2 (two) times daily. Before breakfast & before supper    . potassium chloride (K-DUR) 10 MEQ tablet Take 2 tablets (20 mEq total) by mouth daily. 6 tablet 0  . predniSONE (DELTASONE) 1 MG tablet Take 1 mg by mouth every other day.   0  . ranitidine  (ZANTAC) 300 MG tablet TAKE 1 TABLET AT BEDTIME 90 tablet 1  . saccharomyces boulardii (FLORASTOR) 250 MG capsule Take 1 capsule (250 mg total) by mouth 2 (two) times daily. 30 capsule 0  . SPIRIVA RESPIMAT 2.5 MCG/ACT AERS Inhale 2 puffs into the lungs daily at 2 PM.   11  . spironolactone (ALDACTONE) 100 MG tablet Take 300 mg by mouth daily.      No current facility-administered medications on file prior to visit.     Review of Systems:  As per HPI- otherwise negative.   Physical Examination: Vitals:   05/22/17 1155  BP: (!) 118/50  Pulse: 85  Resp: 18  Temp: 99.3 F (37.4 C)  SpO2: 100%   Vitals:   05/22/17 1155  Weight: 148 lb (67.1 kg)  Height: 5\' 2"  (1.575 m)   Body mass index is 27.07 kg/m. Ideal Body Weight: Weight in (lb) to have BMI = 25: 136.4  GEN: WDWN, NAD, Non-toxic, A & O x 3, looks her normal self given recent serious illness  HEENT: Atraumatic, Normocephalic. Neck supple. No masses, No LAD. Ears and Nose: No external deformity. CV: RRR, No M/G/R. No JVD. No thrill. No extra heart sounds. PULM: CTA B, no wheezes, crackles, rhonchi. No retractions. No resp. distress. No accessory muscle use. ABD: S, belly is distended due to hepatomegaly EXTR: No c/c/e NEURO Normal gait.  PSYCH: Normally interactive. Conversant. Not depressed or anxious appearing.  Calm demeanor.    Assessment and Plan: Portal hypertension (HCC)  Cirrhosis of liver without ascites, unspecified hepatic cirrhosis type ([place]) - Plan: CBC, Basic metabolic panel  Here today to follow-up from recent hospital stay Labs as above She is otherwise stable She will titrate back on her lactulose to keep 3 good sized BM per day She will let me know if I can help her in any way  Results for orders placed or performed in visit on 05/22/17  CBC  Result Value Ref Range   WBC 8.4 4.0 - 10.5 K/uL   RBC 3.34 (L) 3.87 - 5.11 Mil/uL   Platelets 85.0 (L) 150.0 - 400.0 K/uL   Hemoglobin 11.9 (L) 12.0  - 15.0 g/dL   HCT 35.4 (L) 36.0 - 46.0 %   MCV 105.9 (H) 78.0 - 100.0 fl   MCHC 33.5 30.0 - 36.0 g/dL   RDW 15.5 11.5 - 38.7 %  Basic metabolic panel  Result Value Ref Range   Sodium 140 135 - 145 mEq/L   Potassium 4.7 3.5 - 5.1 mEq/L   Chloride 107 96 - 112 mEq/L   CO2 27 19 - 32 mEq/L   Glucose, Bld 113 (H) 70 - 99 mg/dL   BUN 16 6 - 23 mg/dL   Creatinine, Ser 1.08 0.40 - 1.20 mg/dL   Calcium 9.0 8.4 - 10.5 mg/dL   GFR 55.28 (L) >60.00 mL/min     Signed Lamar Blinks, MD

## 2017-05-20 NOTE — Progress Notes (Signed)
Patient is almost 3 weeks status post left index finger mucous cyst excision and debridement of DIP joint osteophyte. She has a healed incision with a dry scab. There is no signs of infection or drainage. She has normal motor function. This sutures were removed today. Neosporin to the incision twice a day with a Band-Aid. Avoid submerging the finger and water for another couple weeks. Follow-up in 4 weeks for final recheck. Overall she is doing very well and not complaining of any pain.

## 2017-05-22 ENCOUNTER — Ambulatory Visit (INDEPENDENT_AMBULATORY_CARE_PROVIDER_SITE_OTHER): Payer: BLUE CROSS/BLUE SHIELD | Admitting: Family Medicine

## 2017-05-22 ENCOUNTER — Encounter: Payer: Self-pay | Admitting: Family Medicine

## 2017-05-22 VITALS — BP 118/50 | HR 85 | Temp 99.3°F | Resp 18 | Ht 62.0 in | Wt 148.0 lb

## 2017-05-22 DIAGNOSIS — K766 Portal hypertension: Secondary | ICD-10-CM

## 2017-05-22 DIAGNOSIS — K7460 Unspecified cirrhosis of liver: Secondary | ICD-10-CM

## 2017-05-22 LAB — BASIC METABOLIC PANEL WITH GFR
BUN: 16 mg/dL (ref 6–23)
CO2: 27 meq/L (ref 19–32)
Calcium: 9 mg/dL (ref 8.4–10.5)
Chloride: 107 meq/L (ref 96–112)
Creatinine, Ser: 1.08 mg/dL (ref 0.40–1.20)
GFR: 55.28 mL/min — ABNORMAL LOW
Glucose, Bld: 113 mg/dL — ABNORMAL HIGH (ref 70–99)
Potassium: 4.7 meq/L (ref 3.5–5.1)
Sodium: 140 meq/L (ref 135–145)

## 2017-05-22 LAB — CBC
HCT: 35.4 % — ABNORMAL LOW (ref 36.0–46.0)
Hemoglobin: 11.9 g/dL — ABNORMAL LOW (ref 12.0–15.0)
MCHC: 33.5 g/dL (ref 30.0–36.0)
MCV: 105.9 fl — ABNORMAL HIGH (ref 78.0–100.0)
Platelets: 85 10*3/uL — ABNORMAL LOW (ref 150.0–400.0)
RBC: 3.34 Mil/uL — ABNORMAL LOW (ref 3.87–5.11)
RDW: 15.5 % (ref 11.5–15.5)
WBC: 8.4 10*3/uL (ref 4.0–10.5)

## 2017-05-22 NOTE — Patient Instructions (Signed)
It was good to see you today- I will be in touch with your labs, please keep me posted about your progress.  I will be praying that you get "the call" soon!

## 2017-06-23 ENCOUNTER — Encounter (INDEPENDENT_AMBULATORY_CARE_PROVIDER_SITE_OTHER): Payer: Self-pay | Admitting: Orthopaedic Surgery

## 2017-06-23 ENCOUNTER — Ambulatory Visit (INDEPENDENT_AMBULATORY_CARE_PROVIDER_SITE_OTHER): Payer: BLUE CROSS/BLUE SHIELD | Admitting: Orthopaedic Surgery

## 2017-06-23 DIAGNOSIS — M67442 Ganglion, left hand: Secondary | ICD-10-CM

## 2017-06-23 NOTE — Progress Notes (Signed)
Patient is 53 days status post left index finger mucous cyst excision. She is doing well. She just complains of little bit of stiffness and numbness around the surgical incision. Overall she is very happy.  Physical exam shows a fully healed scar. She has normal range of motion and function of the DIP joint.  From my standpoint patient has healed her surgery and unwell. Follow-up as needed. Questions encouraged and answered.

## 2017-06-24 ENCOUNTER — Telehealth: Payer: Self-pay | Admitting: Family Medicine

## 2017-06-24 NOTE — Telephone Encounter (Signed)
Pt says that [facility] is suppose to be faxing over orders to PCP to have a blood draw here in the office. She would like to come in and have labs. Please advise.   She said that the coordinators name is Darrick Meigs Lot

## 2017-06-25 ENCOUNTER — Other Ambulatory Visit: Payer: Self-pay | Admitting: Pulmonary Disease

## 2017-06-26 NOTE — Telephone Encounter (Signed)
Relation to ZG:YFVC Call back number:[phone]   Reason for call:  Patient checking on the status if orders were received. Patient states time is sensitve, informed patient PCP is out of the office until Monday. Patient would like a call when orders are received

## 2017-06-26 NOTE — Telephone Encounter (Signed)
Orders not received today, informed PCP's Assistant/SLS 09/27

## 2017-06-27 ENCOUNTER — Other Ambulatory Visit: Payer: Self-pay | Admitting: Family Medicine

## 2017-06-27 DIAGNOSIS — E876 Hypokalemia: Secondary | ICD-10-CM

## 2017-06-27 NOTE — Telephone Encounter (Signed)
Orders not received today, informed PCP's Assistant/SLS 09/28

## 2017-06-27 NOTE — Telephone Encounter (Signed)
Called pt to inform that fax still has not been received. No answer, left voicemail asking pt to have [facility] try faxing the forms again.

## 2017-06-30 ENCOUNTER — Telehealth: Payer: Self-pay | Admitting: Family Medicine

## 2017-06-30 ENCOUNTER — Other Ambulatory Visit: Payer: Self-pay | Admitting: Emergency Medicine

## 2017-06-30 DIAGNOSIS — R0602 Shortness of breath: Secondary | ICD-10-CM

## 2017-06-30 MED ORDER — IPRATROPIUM-ALBUTEROL 0.5-2.5 (3) MG/3ML IN SOLN: 3.0000 mL | RESPIRATORY_TRACT | 0 refills | Status: DC | PRN

## 2017-06-30 NOTE — Telephone Encounter (Signed)
Refill sent per pt request.  

## 2017-06-30 NOTE — Telephone Encounter (Signed)
Caller name: Relation to EG:BTDV Call back Marvin  Reason for call: pt is needing refill on rx ipratropium-albuterol (DUONEB) 0.5-2.5 (3) MG/3ML SOLN.

## 2017-07-01 NOTE — Telephone Encounter (Signed)
Pt [phone]   Pt dropped off Orders from [facility] (pt was also given copy of the order) Pt would like to have Dr Lorelei Pont put in orders in ASAP since she needs it before next wk. Pt also wants to have Flu Shot done also (the egg free flu shot). Please advise pt when orders ready and also to make appt for flu shot. Orders at front desk.

## 2017-07-02 ENCOUNTER — Other Ambulatory Visit (INDEPENDENT_AMBULATORY_CARE_PROVIDER_SITE_OTHER): Payer: BLUE CROSS/BLUE SHIELD

## 2017-07-02 ENCOUNTER — Telehealth: Payer: Self-pay | Admitting: *Deleted

## 2017-07-02 NOTE — Addendum Note (Signed)
Addended by: Lamar Blinks C on: 07/02/2017 11:16 AM   Modules accepted: Orders

## 2017-07-02 NOTE — Telephone Encounter (Signed)
Received notes from [place] pt willl be coming in for lab draw and number to fax results to; forwarded to provider/SLS 10/03

## 2017-07-02 NOTE — Telephone Encounter (Signed)
Called pt and LMOM- she may come in at her convenience for her labs, I have ordered the CMP and PT/INR as requested by [facility] Need to fax results to [phone] asap We do have the egg- free flu shot for her as well

## 2017-07-03 ENCOUNTER — Telehealth: Payer: Self-pay | Admitting: Family Medicine

## 2017-07-03 LAB — COMPREHENSIVE METABOLIC PANEL
ALT: 37 U/L — ABNORMAL HIGH (ref 0–35)
AST: 54 U/L — AB (ref 0–37)
Albumin: 3 g/dL — ABNORMAL LOW (ref 3.5–5.2)
Alkaline Phosphatase: 207 U/L — ABNORMAL HIGH (ref 39–117)
BUN: 11 mg/dL (ref 6–23)
CHLORIDE: 100 meq/L (ref 96–112)
CO2: 33 meq/L — AB (ref 19–32)
CREATININE: 1.18 mg/dL (ref 0.40–1.20)
Calcium: 8.5 mg/dL (ref 8.4–10.5)
GFR: 49.89 mL/min — ABNORMAL LOW (ref >60.00)
Glucose, Bld: 106 mg/dL — ABNORMAL HIGH (ref 70–99)
Potassium: 3.4 mEq/L — ABNORMAL LOW (ref 3.5–5.1)
SODIUM: 139 meq/L (ref 135–145)
Total Bilirubin: 4.3 mg/dL — ABNORMAL HIGH (ref 0.2–1.2)
Total Protein: 5.9 g/dL — ABNORMAL LOW (ref 6.0–8.3)

## 2017-07-03 LAB — PROTIME-INR
INR: 1.3 ratio — ABNORMAL HIGH (ref 0.8–1.0)
PROTHROMBIN TIME: 14.2 s — AB (ref 9.6–13.1)

## 2017-07-03 NOTE — Telephone Encounter (Signed)
When lab results from 07/02/17 come back, please send copy to [facility] Att: Dr Loletha Grayer, fax # [phone]

## 2017-07-03 NOTE — Telephone Encounter (Signed)
Ok done

## 2017-07-15 ENCOUNTER — Ambulatory Visit (INDEPENDENT_AMBULATORY_CARE_PROVIDER_SITE_OTHER): Payer: BLUE CROSS/BLUE SHIELD

## 2017-07-15 DIAGNOSIS — Z23 Encounter for immunization: Secondary | ICD-10-CM | POA: Diagnosis not present

## 2017-07-16 NOTE — Progress Notes (Signed)
[place] [facility] [address], [place], [place] [address] [phone]  Date:  07/17/2017   Name:  Alicia [name]   DOB:  September 01, 1961   MRN:  [id number]  PCP:  Darreld Mclean, MD    Chief Complaint: Dizziness (c/o feeling dizzy, pt states that she passed out at work today. Stopped protonix about 3 days ago)   History of Present Illness:  Alicia [name] is a 56 y.o. very pleasant female patient who presents with the following:  History of congenital hepatic fibrosis, awaiting a transplant per [facility].  She is now fully qualified for her transplant and is just waiting on an appropriate organ to become available for her Here today with concern of illness-  About 9 am she passed out while at work- she was sitting in a chair at staff meeting, the next thing she knew she had passed out in her chair but did not fall to the ground.  Her staff roused her and called her husband- he picked her up and drove her home.  She then fell asleep for several hours.  When she woke up she was not feeling well still, noted a temp of 99.5 and took ibuprofen for this   Here today with Alicia [name], her husband  Notes that she has felt worse than usual the last couple of days- had a harder time eating, more frequent vomiting, has felt shaky and dizzy  The only recent routine change she can think of is that she stopped her protonix per Dr. Gertie Fey about 3 days ago- she had been taking this for nearly 20 years and is not sure if this may have contributed to her fainting spell  Her husband notes that she has a dry cough recently She has noted some pain in her mid back, she thought due to her cough Otherwise she has not had any particular pain  Her belly continues to feel full and bloated   She had made this appt in advance because she wanted me to order a bone density scan for her- I will certainly order this for her   Patient Active Problem List    Diagnosis Date Noted  . Other cirrhosis of liver ([place]) 05/10/2017  . Immunocompromised patient ([place]) 05/10/2017  . Mucous cyst of digit of left hand 04/11/2017  . Cellulitis, abdominal wall 03/01/2017  . Normocytic anemia 03/01/2017  . Hypokalemia 03/01/2017  . CKD (chronic kidney disease), stage III ([place]) 03/01/2017  . Abdominal wall cellulitis 03/01/2017  . Adrenal insufficiency ([place]) 03/01/2017  . Hyperammonemia ([place])   . Cellulitis 06/28/2016  . Thrombocytopenia ([place]) 06/28/2016  . AKI (acute kidney injury) ([place]) 06/28/2016  . Multiple pulmonary nodules determined by computed tomography of lung   . Chronic cough 09/07/2015  . 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 Asthma with chronic obstructive pulmonary disease (COPD) ([place]) 09/04/2011  . 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 GERD (gastroesophageal reflux disease) 07/04/2011  . Sarcoidosis 07/01/2011  . Asthma, severe persistent, with upper airway instability 06/13/2011  . 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 Allergy   . Anemia   . Asthma   . Cellulitis   . GERD (gastroesophageal reflux disease)   . Hepatic fibrosis    congenital  . History of UTI    chronic as a child  . Sarcoidosis   . Spleen enlarged     Past Surgical History:  Procedure  Laterality Date  . COLONOSCOPY    . CYST EXCISION Left 05/01/2017   Procedure: LEFT INDEX FINGER CYST EXCISION;  Surgeon: Leandrew Koyanagi, MD;  Location: [facility];  Service: Orthopedics;  Laterality: Left;  . ESOPHAGOGASTRODUODENOSCOPY    . SPINAL FUSION  12/2007   Double spinal Fusion  . VIDEO BRONCHOSCOPY Bilateral 12/08/2015   Procedure: VIDEO BRONCHOSCOPY WITH FLUORO;  Surgeon: Rigoberto Noel, MD;  Location: [place] [facility];  Service: Cardiopulmonary;  Laterality: Bilateral;    Social History  Substance Use Topics  . Smoking status: Never Smoker  . Smokeless tobacco: Never Used  . Alcohol use No    Family History  Problem Relation  Age of Onset  . Arthritis Mother   . Heart disease Father        Triple bypass  . Migraines Father   . Heart attack Paternal Grandmother   . Stroke Paternal Grandfather   . Liver disease Sister        Juvenile cysitc liver disease  . Asthma Sister     Allergies  Allergen Reactions  . Aspirin Other (See Comments)    Causes Asthma  . Peanut-Containing Drug Products Shortness Of Breath    Asthma, eye swelling, itchy eyes, can't catch her breath  . Gabapentin Other (See Comments)    Blisters in mouth, nausea   . Azithromycin Nausea And Vomiting    Medication list has been reviewed and updated.  Current Outpatient Prescriptions on File Prior to Visit  Medication Sig Dispense Refill  . albuterol (PROVENTIL HFA;VENTOLIN HFA) 108 (90 Base) MCG/ACT inhaler Inhale 1-2 puffs into the lungs every 6 (six) hours as needed for wheezing or shortness of breath. 1 Inhaler 0  . benzonatate (TESSALON) 100 MG capsule Take 100 mg by mouth 3 (three) times daily as needed for cough.   2  . DULERA 200-5 MCG/ACT AERO INHALE 2 PUFFS BY MOUTH TWO TIMES DAILY. 13 Inhaler 0  . furosemide (LASIX) 40 MG tablet Take 1 tablet (40 mg total) by mouth 2 (two) times daily. 30 tablet 0  . ipratropium-albuterol (DUONEB) 0.5-2.5 (3) MG/3ML SOLN Take 3 mLs by nebulization every 4 (four) hours as needed. 360 mL 0  . KLOR-CON M20 20 MEQ tablet TAKE 3 DOSES OF 40 MEQ (2 PILLS) OVER THE NEXT 36 HOURS. THEN RECHECK POTASSIUM LEVEL 30 tablet 3  . lactulose (CHRONULAC) 10 GM/15ML solution Take 30 g by mouth 2 (two) times daily.     . magic mouthwash SOLN Take 5 mLs by mouth 3 (three) times daily. 60 mL 0  . potassium chloride (K-DUR) 10 MEQ tablet Take 2 tablets (20 mEq total) by mouth daily. 6 tablet 0  . predniSONE (DELTASONE) 1 MG tablet Take 1 mg by mouth every other day.   0  . ranitidine (ZANTAC) 300 MG tablet TAKE 1 TABLET AT BEDTIME 90 tablet 0  . saccharomyces boulardii (FLORASTOR) 250 MG capsule Take 1 capsule (250  mg total) by mouth 2 (two) times daily. 30 capsule 0  . SPIRIVA RESPIMAT 2.5 MCG/ACT AERS Inhale 2 puffs into the lungs daily at 2 PM.   11  . spironolactone (ALDACTONE) 100 MG tablet Take 300 mg by mouth daily.      No current facility-administered medications on file prior to visit.     Review of Systems:  As per HPI- otherwise negative. Low grade fever Feels overall weaker than is normal for her   Physical Examination: Vitals:   07/17/17 1600  BP: 104/61  Pulse: 83  Temp: 98.6 F (37 C)  SpO2: 99%   Vitals:   07/17/17 1600  Weight: 151 lb 6.4 oz (68.7 kg)  Height: 5\' 2"  (1.575 m)   Body mass index is 27.69 kg/m. Ideal Body Weight: Weight in (lb) to have BMI = 25: 136.4  GEN: WDWN, NAD, Non-toxic, A & O x 3, yellow sclera, appears weak but otherwise her normal self HEENT: Atraumatic, Normocephalic. Neck supple. No masses, No LAD. Ears and Nose: No external deformity. CV: RRR, No M/G/R. No JVD. No thrill. No extra heart sounds. PULM: no wheezes-  No retractions. No resp. distress. No accessory muscle use. Mild ronchi in bases bilaterally  ABD: hepatomegaly and associated distention EXTR: No c/c,  She does have edema of both LE, a bit worse than her baseline  NEURO slow, walked into clinic on her own with a bit of support  PSYCH: Normally interactive. Conversant. Not depressed or anxious appearing.  Calm demeanor.   Our EKG machine is down so I am unable to obtain an EKG for her today Assessment and Plan: Congenital cirrhosis  Syncope, unspecified syncope type  Delicate pt with end stage liver disease awaiting transplant.  Today she fainted at work and then noted a low grade fever.  She is not feeling as strong as she generally does Referral to ER for further evaluation  Will order bone density scan for a later date She also wants a pneumovax but defer due to acute illness   Signed Lamar Blinks, MD

## 2017-07-17 ENCOUNTER — Emergency Department (HOSPITAL_BASED_OUTPATIENT_CLINIC_OR_DEPARTMENT_OTHER): Payer: BLUE CROSS/BLUE SHIELD

## 2017-07-17 ENCOUNTER — Observation Stay (HOSPITAL_BASED_OUTPATIENT_CLINIC_OR_DEPARTMENT_OTHER)
Admission: EM | Admit: 2017-07-17 | Discharge: 2017-07-18 | Disposition: A | Discharge status: Other Acute Inpatient Hospital | Payer: BLUE CROSS/BLUE SHIELD | Attending: Internal Medicine | Admitting: Internal Medicine

## 2017-07-17 ENCOUNTER — Encounter (HOSPITAL_BASED_OUTPATIENT_CLINIC_OR_DEPARTMENT_OTHER): Payer: Self-pay | Admitting: Emergency Medicine

## 2017-07-17 ENCOUNTER — Ambulatory Visit (INDEPENDENT_AMBULATORY_CARE_PROVIDER_SITE_OTHER): Payer: BLUE CROSS/BLUE SHIELD | Admitting: Family Medicine

## 2017-07-17 ENCOUNTER — Encounter: Payer: Self-pay | Admitting: Family Medicine

## 2017-07-17 DIAGNOSIS — R55 Syncope and collapse: Principal | ICD-10-CM | POA: Insufficient documentation

## 2017-07-17 DIAGNOSIS — K740 Hepatic fibrosis: Secondary | ICD-10-CM | POA: Diagnosis not present

## 2017-07-17 DIAGNOSIS — J90 Pleural effusion, not elsewhere classified: Secondary | ICD-10-CM | POA: Insufficient documentation

## 2017-07-17 DIAGNOSIS — E876 Hypokalemia: Secondary | ICD-10-CM | POA: Diagnosis not present

## 2017-07-17 DIAGNOSIS — K219 Gastro-esophageal reflux disease without esophagitis: Secondary | ICD-10-CM | POA: Diagnosis not present

## 2017-07-17 DIAGNOSIS — J4550 Severe persistent asthma, uncomplicated: Secondary | ICD-10-CM | POA: Insufficient documentation

## 2017-07-17 DIAGNOSIS — Z881 Allergy status to other antibiotic agents status: Secondary | ICD-10-CM | POA: Insufficient documentation

## 2017-07-17 DIAGNOSIS — E2839 Other primary ovarian failure: Secondary | ICD-10-CM

## 2017-07-17 DIAGNOSIS — Z91010 Allergy to peanuts: Secondary | ICD-10-CM | POA: Insufficient documentation

## 2017-07-17 DIAGNOSIS — R140 Abdominal distension (gaseous): Secondary | ICD-10-CM | POA: Diagnosis not present

## 2017-07-17 DIAGNOSIS — K7469 Other cirrhosis of liver: Secondary | ICD-10-CM | POA: Diagnosis not present

## 2017-07-17 DIAGNOSIS — Z888 Allergy status to other drugs, medicaments and biological substances status: Secondary | ICD-10-CM | POA: Insufficient documentation

## 2017-07-17 DIAGNOSIS — N183 Chronic kidney disease, stage 3 (moderate): Secondary | ICD-10-CM | POA: Insufficient documentation

## 2017-07-17 DIAGNOSIS — R600 Localized edema: Secondary | ICD-10-CM | POA: Diagnosis not present

## 2017-07-17 DIAGNOSIS — Z79899 Other long term (current) drug therapy: Secondary | ICD-10-CM | POA: Diagnosis not present

## 2017-07-17 DIAGNOSIS — I129 Hypertensive chronic kidney disease with stage 1 through stage 4 chronic kidney disease, or unspecified chronic kidney disease: Secondary | ICD-10-CM | POA: Insufficient documentation

## 2017-07-17 DIAGNOSIS — Z7952 Long term (current) use of systemic steroids: Secondary | ICD-10-CM | POA: Insufficient documentation

## 2017-07-17 DIAGNOSIS — D649 Anemia, unspecified: Secondary | ICD-10-CM | POA: Diagnosis not present

## 2017-07-17 DIAGNOSIS — E2740 Unspecified adrenocortical insufficiency: Secondary | ICD-10-CM | POA: Insufficient documentation

## 2017-07-17 DIAGNOSIS — I517 Cardiomegaly: Secondary | ICD-10-CM | POA: Insufficient documentation

## 2017-07-17 DIAGNOSIS — D869 Sarcoidosis, unspecified: Secondary | ICD-10-CM | POA: Insufficient documentation

## 2017-07-17 DIAGNOSIS — R0602 Shortness of breath: Secondary | ICD-10-CM | POA: Diagnosis not present

## 2017-07-17 DIAGNOSIS — Z886 Allergy status to analgesic agent status: Secondary | ICD-10-CM | POA: Diagnosis not present

## 2017-07-17 HISTORY — DX: Unspecified cirrhosis of liver: K74.60

## 2017-07-17 LAB — CBC WITH DIFFERENTIAL/PLATELET
BASOS PCT: 0 %
Basophils Absolute: 0 10*3/uL (ref 0.0–0.1)
EOS PCT: 2 %
Eosinophils Absolute: 0.1 10*3/uL (ref 0.0–0.7)
HCT: 30.4 % — ABNORMAL LOW (ref 36.0–46.0)
HEMOGLOBIN: 10.4 g/dL — AB (ref 12.0–15.0)
LYMPHS ABS: 1.2 10*3/uL (ref 0.7–4.0)
LYMPHS PCT: 24 %
MCH: 34.6 pg — AB (ref 26.0–34.0)
MCHC: 34.2 g/dL (ref 30.0–36.0)
MCV: 101 fL — AB (ref 78.0–100.0)
MONOS PCT: 15 %
Monocytes Absolute: 0.8 10*3/uL (ref 0.1–1.0)
NEUTROS ABS: 2.9 10*3/uL (ref 1.7–7.7)
Neutrophils Relative %: 59 %
Platelets: 73 10*3/uL — ABNORMAL LOW (ref 150–400)
RBC: 3.01 MIL/uL — ABNORMAL LOW (ref 3.87–5.11)
RDW: 14.3 % (ref 11.5–15.5)
Smear Review: DECREASED
WBC: 5 10*3/uL (ref 4.0–10.5)

## 2017-07-17 LAB — COMPREHENSIVE METABOLIC PANEL
ALBUMIN: 2.4 g/dL — AB (ref 3.5–5.0)
ALK PHOS: 199 U/L — AB (ref 38–126)
ALT: 42 U/L (ref 14–54)
ANION GAP: 6 (ref 5–15)
AST: 68 U/L — ABNORMAL HIGH (ref 15–41)
BILIRUBIN TOTAL: 3.9 mg/dL — AB (ref 0.3–1.2)
BUN: 11 mg/dL (ref 6–20)
CALCIUM: 7.9 mg/dL — AB (ref 8.9–10.3)
CO2: 28 mmol/L (ref 22–32)
Chloride: 101 mmol/L (ref 101–111)
Creatinine, Ser: 1.05 mg/dL — ABNORMAL HIGH (ref 0.44–1.00)
GFR, EST NON AFRICAN AMERICAN: 57 mL/min — AB (ref >60)
GLUCOSE: 100 mg/dL — AB (ref 65–99)
POTASSIUM: 3 mmol/L — AB (ref 3.5–5.1)
Sodium: 135 mmol/L (ref 135–145)
TOTAL PROTEIN: 5.2 g/dL — AB (ref 6.5–8.1)

## 2017-07-17 LAB — URINALYSIS, ROUTINE W REFLEX MICROSCOPIC
Bilirubin Urine: NEGATIVE
GLUCOSE, UA: NEGATIVE mg/dL
HGB URINE DIPSTICK: NEGATIVE
KETONES UR: NEGATIVE mg/dL
LEUKOCYTES UA: NEGATIVE
Nitrite: NEGATIVE
PH: 6.5 (ref 5.0–8.0)
Protein, ur: NEGATIVE mg/dL
Specific Gravity, Urine: 1.01 (ref 1.005–1.030)

## 2017-07-17 LAB — MAGNESIUM: Magnesium: 1.8 mg/dL (ref 1.7–2.4)

## 2017-07-17 LAB — BRAIN NATRIURETIC PEPTIDE: B Natriuretic Peptide: 127.2 pg/mL — ABNORMAL HIGH (ref 0.0–100.0)

## 2017-07-17 LAB — AMMONIA: AMMONIA: 75 umol/L — AB (ref 9–35)

## 2017-07-17 LAB — TROPONIN I

## 2017-07-17 LAB — CBG MONITORING, ED: Glucose-Capillary: 89 mg/dL (ref 65–99)

## 2017-07-17 MED ORDER — POTASSIUM CHLORIDE CRYS ER 20 MEQ PO TBCR
40.0000 meq | EXTENDED_RELEASE_TABLET | Freq: Once | ORAL | Status: AC
  Administered 2017-07-17: 40 meq via ORAL
  Filled 2017-07-17: qty 2

## 2017-07-17 NOTE — ED Triage Notes (Signed)
Pt reports syncopal episode this morning while at work. Pt reports getting dizzy prior to the incident and is still dizzy. Pt did not fall to the floor, she was sitting in a chair. Pt with hx of liver failure and on the list for a transplant.

## 2017-07-17 NOTE — ED Provider Notes (Signed)
[facility] EMERGENCY DEPARTMENT Provider Note   CSN: [id number] Arrival date & time: 07/17/17  1622     History   Chief Complaint Chief Complaint  Patient presents with  . Loss of Consciousness    HPI Jacquelyn Yinger is a 56 y.o. female.  The history is provided by the patient and medical records.  Loss of Consciousness   This is a new problem. The current episode started 1 to 2 hours ago. The problem occurs rarely. The problem has been resolved. She lost consciousness for a period of less than one minute. The problem is associated with normal activity. Pertinent negatives include abdominal pain, back pain, chest pain, confusion, congestion, diaphoresis, dizziness, fever, focal weakness, headaches, light-headedness, nausea, palpitations, seizures, vertigo, vomiting and weakness. She has tried nothing for the symptoms.    Past Medical History:  Diagnosis Date  . Allergy   . Anemia   . Asthma   . Cellulitis   . Cirrhosis ([place])   . GERD (gastroesophageal reflux disease)   . Hepatic fibrosis    congenital  . History of UTI    chronic as a child  . Sarcoidosis   . Spleen enlarged     Patient Active Problem List   Diagnosis Date Noted  . Other cirrhosis of liver ([place]) 05/10/2017  . Immunocompromised patient ([place]) 05/10/2017  . Mucous cyst of digit of left hand 04/11/2017  . Cellulitis, abdominal wall 03/01/2017  . Normocytic anemia 03/01/2017  . Hypokalemia 03/01/2017  . CKD (chronic kidney disease), stage III ([place]) 03/01/2017  . Abdominal wall cellulitis 03/01/2017  . Adrenal insufficiency ([place]) 03/01/2017  . Hyperammonemia ([place])   . Cellulitis 06/28/2016  . Thrombocytopenia ([place]) 06/28/2016  . AKI (acute kidney injury) ([place]) 06/28/2016  . Multiple pulmonary nodules determined by computed tomography of lung   . Chronic cough 09/07/2015  . Hypertension, portal ([place]) 09/04/2011  . Asthma with chronic obstructive pulmonary disease (COPD) ([place]) 09/04/2011    . 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 GERD (gastroesophageal reflux disease) 07/04/2011  . Sarcoidosis 07/01/2011  . Asthma, severe persistent, with upper airway instability 06/13/2011  . 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 COLONOSCOPY    . CYST EXCISION Left 05/01/2017   Procedure: LEFT INDEX FINGER CYST EXCISION;  Surgeon: Leandrew Koyanagi, MD;  Location: [facility];  Service: Orthopedics;  Laterality: Left;  . ESOPHAGOGASTRODUODENOSCOPY    . SPINAL FUSION  12/2007   Double spinal Fusion  . VIDEO BRONCHOSCOPY Bilateral 12/08/2015   Procedure: VIDEO BRONCHOSCOPY WITH FLUORO;  Surgeon: Rigoberto Noel, MD;  Location: [facility];  Service: Cardiopulmonary;  Laterality: Bilateral;    OB History    No data available       Home Medications    Prior to Admission medications   Medication Sig Start Date End Date Taking? Authorizing Provider  albuterol (PROVENTIL HFA;VENTOLIN HFA) 108 (90 Base) MCG/ACT inhaler Inhale 1-2 puffs into the lungs every 6 (six) hours as needed for wheezing or shortness of breath. 10/19/16   Palumbo, April, MD  benzonatate (TESSALON) 100 MG capsule Take 100 mg by mouth 3 (three) times daily as needed for cough.  01/30/17   [provider]  DULERA 200-5 MCG/ACT AERO INHALE 2 PUFFS BY MOUTH TWO TIMES DAILY. 03/17/17   Rigoberto Noel, MD  furosemide (LASIX) 40 MG tablet Take 1 tablet (40 mg total) by mouth 2 (two) times daily. Patient taking differently: Take 120 mg  by mouth 2 (two) times daily.  05/15/17   Regalado, Belkys A, MD  ipratropium-albuterol (DUONEB) 0.5-2.5 (3) MG/3ML SOLN Take 3 mLs by nebulization every 4 (four) hours as needed. 06/30/17   Copland, Gay Filler, MD  KLOR-CON M20 20 MEQ tablet TAKE 3 DOSES OF 40 MEQ (2 PILLS) OVER THE NEXT 36 HOURS. THEN RECHECK POTASSIUM LEVEL 07/02/17   [facility]land, Gay Filler, MD  lactulose (CHRONULAC) 10 GM/15ML solution Take 30 g by mouth 2 (two) times daily.  06/27/16    [provider]  magic mouthwash SOLN Take 5 mLs by mouth 3 (three) times daily. 05/15/17   Regalado, Belkys A, MD  potassium chloride (K-DUR) 10 MEQ tablet Take 2 tablets (20 mEq total) by mouth daily. 05/15/17   Regalado, Belkys A, MD  predniSONE (DELTASONE) 1 MG tablet Take 1 mg by mouth every other day.  01/27/17   [provider]  ranitidine (ZANTAC) 300 MG tablet TAKE 1 TABLET AT BEDTIME 06/26/17   Parrett, Fonnie Mu, NP  saccharomyces boulardii (FLORASTOR) 250 MG capsule Take 1 capsule (250 mg total) by mouth 2 (two) times daily. 05/15/17   Regalado, Jerald Kief A, MD  SPIRIVA RESPIMAT 2.5 MCG/ACT AERS Inhale 2 puffs into the lungs daily at 2 PM.  01/27/17   [provider]  spironolactone (ALDACTONE) 100 MG tablet Take 300 mg by mouth daily.     [provider]    Family History Family History  Problem Relation Age of Onset  . Arthritis Mother   . Heart disease Father        Triple bypass  . Migraines Father   . Heart attack Paternal Grandmother   . Stroke Paternal Grandfather   . Liver disease Sister        Juvenile cysitc liver disease  . Asthma Sister     Social History Social History  Substance Use Topics  . Smoking status: Never Smoker  . Smokeless tobacco: Never Used  . Alcohol use No     Allergies   Aspirin; Peanut-containing drug products; Gabapentin; and Azithromycin   Review of Systems Review of Systems  Constitutional: Positive for fatigue. Negative for chills, diaphoresis and fever.  HENT: Negative for congestion and rhinorrhea.   Eyes: Negative for visual disturbance.  Respiratory: Positive for shortness of breath. Negative for chest tightness, wheezing and stridor.   Cardiovascular: Positive for leg swelling and syncope. Negative for chest pain and palpitations.  Gastrointestinal: Positive for abdominal distention. Negative for abdominal pain, constipation, diarrhea, nausea and vomiting.  Genitourinary: Negative for dysuria.    Musculoskeletal: Negative for back pain, neck pain and neck stiffness.  Skin: Negative for rash and wound.  Neurological: Positive for syncope. Negative for dizziness, vertigo, focal weakness, seizures, speech difficulty, weakness, light-headedness and headaches.  Psychiatric/Behavioral: Negative for agitation and confusion.  All other systems reviewed and are negative.    Physical Exam Updated Vital Signs BP (!) 117/59 (BP Location: Right Arm)   Pulse 76   Temp 99.2 F (37.3 C) (Oral)   Resp 18   Ht 5\' 2"  (1.575 m)   Wt 68.5 kg (151 lb)   SpO2 96%   BMI 27.62 kg/m   Physical Exam  Constitutional: She is oriented to person, place, and time. She appears well-developed and well-nourished. No distress.  HENT:  Head: Normocephalic and atraumatic.  Mouth/Throat: Oropharynx is clear and moist. No oropharyngeal exudate.  Eyes: Pupils are equal, round, and reactive to light. Conjunctivae and EOM are normal.  Neck: Normal  range of motion. Neck supple.  Cardiovascular: Normal rate, regular rhythm, normal heart sounds and intact distal pulses.   No murmur heard. Pulmonary/Chest: Effort normal. No stridor. No respiratory distress. She has no wheezes. She has rales (mild in bases). She exhibits no tenderness.  Abdominal: Soft. She exhibits distension. There is no tenderness. There is no guarding.  Musculoskeletal: She exhibits edema (bilateral legs). She exhibits no tenderness.  Neurological: She is alert and oriented to person, place, and time. No cranial nerve deficit or sensory deficit. She exhibits normal muscle tone. Coordination normal.  Skin: Skin is warm and dry. Capillary refill takes less than 2 seconds. No rash noted. She is not diaphoretic. No erythema.  Psychiatric: She has a normal mood and affect.  Nursing note and vitals reviewed.    ED Treatments / Results  Labs (all labs ordered are listed, but only abnormal results are displayed) Labs Reviewed  CBC WITH  DIFFERENTIAL/PLATELET - Abnormal; Notable for the following:       Result Value   RBC 3.01 (*)    Hemoglobin 10.4 (*)    HCT 30.4 (*)    MCV 101.0 (*)    MCH 34.6 (*)    Platelets 73 (*)    All other components within normal limits  COMPREHENSIVE METABOLIC PANEL - Abnormal; Notable for the following:    Potassium 3.0 (*)    Glucose, Bld 100 (*)    Creatinine, Ser 1.05 (*)    Calcium 7.9 (*)    Total Protein 5.2 (*)    Albumin 2.4 (*)    AST 68 (*)    Alkaline Phosphatase 199 (*)    Total Bilirubin 3.9 (*)    GFR calc non Af Amer 57 (*)    All other components within normal limits  AMMONIA - Abnormal; Notable for the following:    Ammonia 75 (*)    All other components within normal limits  BRAIN NATRIURETIC PEPTIDE - Abnormal; Notable for the following:    B Natriuretic Peptide 127.2 (*)    All other components within normal limits  URINALYSIS, ROUTINE W REFLEX MICROSCOPIC - Abnormal; Notable for the following:    APPearance CLOUDY (*)    All other components within normal limits  TROPONIN I  MAGNESIUM  CBG MONITORING, ED    EKG  EKG Interpretation  Date/Time:  Thursday July 17 2017 16:28:35 EDT Ventricular Rate:  81 PR Interval:  128 QRS Duration: 88 QT Interval:  416 QTC Calculation: 483 R Axis:   69 Text Interpretation:  Normal sinus rhythm Prolonged QT Abnormal ECG When compared to prior, no significant changes seen.  No STEMI Confirmed by Antony Blackbird [id number]) on 07/17/2017 4:33:11 PM       Radiology Dg Chest 2 View  Result Date: 07/17/2017 CLINICAL DATA:  Syncopal episode this morning. Dizziness. History of sarcoidosis and hepatic fibrosis. EXAM: CHEST  2 VIEW COMPARISON:  Chest radiograph May 18, 2017 and CT chest October 27, 2016 FINDINGS: Cardiac silhouette is upper limits normal in size mediastinal silhouette is nonsuspicious. New small LEFT pleural effusion. Diffuse interstitial prominence. No focal consolidation. No pneumothorax. ACDF. Soft  tissue planes and included osseous structures are nonsuspicious. IMPRESSION: New small pleural effusion. Borderline cardiomegaly. Interstitial prominence, increased from prior chest CT concerning for atypical pneumonia or pulmonary edema and, underlying sarcoidosis. Electronically Signed   By: Elon Alas M.D.   On: 07/17/2017 19:44    Procedures Procedures (including critical care time)  Medications Ordered in ED Medications  potassium chloride SA (K-DUR,KLOR-CON) CR tablet 40 mEq (40 mEq Oral Given 07/17/17 2236)     Initial Impression / Assessment and Plan / ED Course  I have reviewed the triage vital signs and the nursing notes.  Pertinent labs & imaging results that were available during my care of the patient were reviewed by me and considered in my medical decision making (see chart for details).     Raphaella Streight is a 56 y.o. 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 Patient reports that she is currently on the transplant list at [facility]. She says that for the last few days she has had worsening edema in both legs and her abdomen. She says the last time this happened there was fluid in her chest and around her heart. Shesays that she has had nausea and vomiting each morning for the last 2 days has felt very fatigued. She is also having some shortness of breath. She reports she is not eating as much and is having a constant nausea. She reports a dry cough that is intermittent. She denies fevers or chills however. She reports chronic abdominal pain that is no different than prior. She denies any urinary symptoms or constipation or  diarrhea. She reports that she takes her lactulose for elevated ammonia in the past. She reports that yesterday she was feeling "drunk" despite not drinking any alcohol in years. She says that she had some unsteadiness with gait yesterday that improved. She says that today while at work at [employer], she had no preceding symptoms and then syncopized in the chair. She reports that she was awakened by a coworker who help get her help. She says that she was feeling very sleepy and fatigued. She denies any chest pain, palpitations, or diaphoresis.  On exam, patient has edema in both lower cavities. She reports this is new over the last few days despite taking her Lasix. She reports her abdomen is slightly more distended than normal but is nontender. Her lungs had crackles in the bases but no rhonchi were appreciated. Chest and back were nontender. No CVA tenderness. Patient had no focal neurologic deficits on my exam.  EKG appeared normal sinus rhythm with no significant changes from prior appreciated.  Based on patient's symptoms, there is concern for worsening fluid overload in her legs abdomen and chest with the crackles on lungs. Patient will have laboratory testing, chest x-ray, and a bedside ultrasound will be performed by me given her history of pericardial effusion.  Patient also workup for occult infection with her fatigue nausea vomiting and the syncopal episode. Patient will have an ammonia checked given her report of previously elevated ammonias and the fatigue.  Anticipate reassessment after workup.  Patient's diagnostic testing results are seen above. Patient found to have mild anemia similar to previous values. No leukocytosis. Metabolic panel showed mild hypokalemia. Potassium supplementation ordered in ED. Liver function slightly worse than prior. Ammonia elevated at 75 however, this is improved from prior values. BNP gradually rising at 127.2 from prior. Troponin negative.  Chest  x-ray showed several concerns with a left pleural effusion, cardiomegaly, and interstitial edema versus atypical pneumonia. Given the patient's lack of cough, normal white blood cell count, and  lack of pneumonia symptoms, do not feel she has pneumonia. Given the patient's larger edema, abdominal distention, elevated BNP, and the x-ray findings, there is more concern for fluid overload.   Bedside ultrasound was attempted however windows were difficult to see. There was concern for possible small pericardial effusion.  Given the patient's syncope with worsening fluid overload in multiple locations and concern for recurrent pericardial effusion, decision made to admit the patient. As patient's previous care was at [name], they were called for transfer. Dr. Doren Custard with [facility] agreed with admission to the hospital service at their facility. After speaking with the hospitalist, they reported that they were on full divergent adult hospitalist. They will place the patient on a list for transfer but recommended patient be admitted to a local hospital initially for initiation of therapy.  Hospitalist team at [facility] will be called. Anticipate admission and when bed is available, possible transfer to [facility] patient still requires transfer.  Hospitalist called and they agree to admission. Patient will be admitted to [facility] for further management and diuresis.    Final Clinical Impressions(s) / ED Diagnoses   Final diagnoses:  Syncope, unspecified syncope type     Clinical Impression: 1. Syncope, unspecified syncope type     Disposition: Admit to Hospitalist service    Tegeler, Gwenyth Allegra, MD 07/18/17 1254

## 2017-07-18 DIAGNOSIS — Z7952 Long term (current) use of systemic steroids: Secondary | ICD-10-CM | POA: Diagnosis not present

## 2017-07-18 DIAGNOSIS — I517 Cardiomegaly: Secondary | ICD-10-CM | POA: Diagnosis not present

## 2017-07-18 DIAGNOSIS — Z79899 Other long term (current) drug therapy: Secondary | ICD-10-CM | POA: Diagnosis not present

## 2017-07-18 DIAGNOSIS — Z888 Allergy status to other drugs, medicaments and biological substances status: Secondary | ICD-10-CM | POA: Diagnosis not present

## 2017-07-18 DIAGNOSIS — K219 Gastro-esophageal reflux disease without esophagitis: Secondary | ICD-10-CM | POA: Diagnosis not present

## 2017-07-18 DIAGNOSIS — E2740 Unspecified adrenocortical insufficiency: Secondary | ICD-10-CM | POA: Diagnosis not present

## 2017-07-18 DIAGNOSIS — Z91010 Allergy to peanuts: Secondary | ICD-10-CM | POA: Diagnosis not present

## 2017-07-18 DIAGNOSIS — R55 Syncope and collapse: Secondary | ICD-10-CM | POA: Diagnosis present

## 2017-07-18 DIAGNOSIS — J90 Pleural effusion, not elsewhere classified: Secondary | ICD-10-CM | POA: Diagnosis not present

## 2017-07-18 DIAGNOSIS — R600 Localized edema: Secondary | ICD-10-CM | POA: Diagnosis not present

## 2017-07-18 DIAGNOSIS — K7469 Other cirrhosis of liver: Secondary | ICD-10-CM | POA: Diagnosis not present

## 2017-07-18 DIAGNOSIS — R0602 Shortness of breath: Secondary | ICD-10-CM | POA: Diagnosis not present

## 2017-07-18 DIAGNOSIS — D649 Anemia, unspecified: Secondary | ICD-10-CM | POA: Diagnosis not present

## 2017-07-18 DIAGNOSIS — K740 Hepatic fibrosis: Secondary | ICD-10-CM | POA: Diagnosis not present

## 2017-07-18 DIAGNOSIS — E876 Hypokalemia: Secondary | ICD-10-CM | POA: Diagnosis not present

## 2017-07-18 DIAGNOSIS — J4550 Severe persistent asthma, uncomplicated: Secondary | ICD-10-CM | POA: Diagnosis not present

## 2017-07-18 DIAGNOSIS — D869 Sarcoidosis, unspecified: Secondary | ICD-10-CM | POA: Diagnosis not present

## 2017-07-18 DIAGNOSIS — R140 Abdominal distension (gaseous): Secondary | ICD-10-CM | POA: Diagnosis not present

## 2017-07-18 DIAGNOSIS — Z886 Allergy status to analgesic agent status: Secondary | ICD-10-CM | POA: Diagnosis not present

## 2017-07-18 DIAGNOSIS — Z881 Allergy status to other antibiotic agents status: Secondary | ICD-10-CM | POA: Diagnosis not present

## 2017-07-18 DIAGNOSIS — I129 Hypertensive chronic kidney disease with stage 1 through stage 4 chronic kidney disease, or unspecified chronic kidney disease: Secondary | ICD-10-CM | POA: Diagnosis not present

## 2017-07-18 DIAGNOSIS — N183 Chronic kidney disease, stage 3 (moderate): Secondary | ICD-10-CM | POA: Diagnosis not present

## 2017-07-23 MED FILL — Ondansetron HCl Inj 4 MG/2ML (2 MG/ML): INTRAMUSCULAR | Qty: 2 | Status: AC

## 2017-07-30 ENCOUNTER — Telehealth: Payer: Self-pay | Admitting: Family Medicine

## 2017-07-30 NOTE — Telephone Encounter (Signed)
Caller name: Marlon Pel Relation to [id number] Assistant from [facility]  Call back number: [phone]   Reason for call:  PA would like to discuss anticoagulant, please return call and ask for any PA in the liver transplant department to discuss, please advise

## 2017-07-31 NOTE — Telephone Encounter (Signed)
Called them back- they plan to put her on coumadin after her operation.  I am glad to monitor her INR.  Her goal will be between 2 and 3 like normal

## 2017-08-11 ENCOUNTER — Telehealth: Payer: Self-pay | Admitting: Family Medicine

## 2017-08-11 NOTE — Telephone Encounter (Signed)
Needs warfarin next 3 mos- Lovenox bridge, continiuing for the next couple of days pt is getting right heart cath today &  will be released this evening or early tomorrow. Please advise if [facility] agrees to manage bridge from levenox to warfarin.  [place]- [phone]

## 2017-08-11 NOTE — Telephone Encounter (Signed)
Called back and let the transplant team know that I am glad to help with this

## 2017-08-15 ENCOUNTER — Encounter: Payer: Self-pay | Admitting: Family Medicine

## 2017-08-15 ENCOUNTER — Other Ambulatory Visit: Payer: Self-pay | Admitting: Family Medicine

## 2017-08-15 DIAGNOSIS — K7210 Chronic hepatic failure without coma: Secondary | ICD-10-CM

## 2017-08-15 DIAGNOSIS — Z7901 Long term (current) use of anticoagulants: Secondary | ICD-10-CM

## 2017-08-15 NOTE — Progress Notes (Signed)
Received word from [facility] that she is being discharged to home today- she is back on her coumadin but is subtherapeutic, so she is on lovenox bridge.  She will come in for a lab visit only on Monday for INR and CMP.

## 2017-08-16 ENCOUNTER — Encounter (HOSPITAL_BASED_OUTPATIENT_CLINIC_OR_DEPARTMENT_OTHER): Payer: Self-pay | Admitting: Emergency Medicine

## 2017-08-16 ENCOUNTER — Emergency Department (HOSPITAL_BASED_OUTPATIENT_CLINIC_OR_DEPARTMENT_OTHER)
Admission: EM | Admit: 2017-08-16 | Discharge: 2017-08-16 | Disposition: A | Discharge status: Other Acute Inpatient Hospital | Payer: BLUE CROSS/BLUE SHIELD | Attending: Emergency Medicine | Admitting: Emergency Medicine

## 2017-08-16 ENCOUNTER — Other Ambulatory Visit: Payer: Self-pay

## 2017-08-16 ENCOUNTER — Emergency Department (HOSPITAL_BASED_OUTPATIENT_CLINIC_OR_DEPARTMENT_OTHER): Payer: BLUE CROSS/BLUE SHIELD

## 2017-08-16 DIAGNOSIS — J45909 Unspecified asthma, uncomplicated: Secondary | ICD-10-CM | POA: Diagnosis not present

## 2017-08-16 DIAGNOSIS — M79661 Pain in right lower leg: Secondary | ICD-10-CM | POA: Diagnosis present

## 2017-08-16 DIAGNOSIS — D689 Coagulation defect, unspecified: Secondary | ICD-10-CM | POA: Insufficient documentation

## 2017-08-16 DIAGNOSIS — I129 Hypertensive chronic kidney disease with stage 1 through stage 4 chronic kidney disease, or unspecified chronic kidney disease: Secondary | ICD-10-CM | POA: Diagnosis not present

## 2017-08-16 DIAGNOSIS — D696 Thrombocytopenia, unspecified: Secondary | ICD-10-CM | POA: Insufficient documentation

## 2017-08-16 DIAGNOSIS — N183 Chronic kidney disease, stage 3 (moderate): Secondary | ICD-10-CM | POA: Diagnosis not present

## 2017-08-16 DIAGNOSIS — Z79899 Other long term (current) drug therapy: Secondary | ICD-10-CM | POA: Diagnosis not present

## 2017-08-16 DIAGNOSIS — K769 Liver disease, unspecified: Secondary | ICD-10-CM

## 2017-08-16 DIAGNOSIS — Z91010 Allergy to peanuts: Secondary | ICD-10-CM | POA: Diagnosis not present

## 2017-08-16 DIAGNOSIS — T148XXA Other injury of unspecified body region, initial encounter: Secondary | ICD-10-CM

## 2017-08-16 LAB — CBC WITH DIFFERENTIAL/PLATELET
BASOS ABS: 0 10*3/uL (ref 0.0–0.1)
Basophils Relative: 0 %
EOS ABS: 0 10*3/uL (ref 0.0–0.7)
EOS PCT: 1 %
HCT: 30.7 % — ABNORMAL LOW (ref 36.0–46.0)
Hemoglobin: 10.5 g/dL — ABNORMAL LOW (ref 12.0–15.0)
LYMPHS PCT: 14 %
Lymphs Abs: 1 10*3/uL (ref 0.7–4.0)
MCH: 33.7 pg (ref 26.0–34.0)
MCHC: 34.2 g/dL (ref 30.0–36.0)
MCV: 98.4 fL (ref 78.0–100.0)
MONO ABS: 0.9 10*3/uL (ref 0.1–1.0)
Monocytes Relative: 13 %
Neutro Abs: 5.1 10*3/uL (ref 1.7–7.7)
Neutrophils Relative %: 72 %
PLATELETS: 122 10*3/uL — AB (ref 150–400)
RBC: 3.12 MIL/uL — ABNORMAL LOW (ref 3.87–5.11)
RDW: 14.4 % (ref 11.5–15.5)
WBC: 7.1 10*3/uL (ref 4.0–10.5)

## 2017-08-16 LAB — COMPREHENSIVE METABOLIC PANEL
ALT: 44 U/L (ref 14–54)
ANION GAP: 7 (ref 5–15)
AST: 51 U/L — AB (ref 15–41)
Albumin: 3.5 g/dL (ref 3.5–5.0)
Alkaline Phosphatase: 177 U/L — ABNORMAL HIGH (ref 38–126)
BUN: 24 mg/dL — ABNORMAL HIGH (ref 6–20)
CALCIUM: 8.4 mg/dL — AB (ref 8.9–10.3)
CO2: 25 mmol/L (ref 22–32)
Chloride: 99 mmol/L — ABNORMAL LOW (ref 101–111)
Creatinine, Ser: 1.04 mg/dL — ABNORMAL HIGH (ref 0.44–1.00)
GFR, EST NON AFRICAN AMERICAN: 58 mL/min — AB (ref >60)
Glucose, Bld: 154 mg/dL — ABNORMAL HIGH (ref 65–99)
POTASSIUM: 3.7 mmol/L (ref 3.5–5.1)
SODIUM: 131 mmol/L — AB (ref 135–145)
Total Bilirubin: 2.4 mg/dL — ABNORMAL HIGH (ref 0.3–1.2)
Total Protein: 6.8 g/dL (ref 6.5–8.1)

## 2017-08-16 LAB — PROTIME-INR
INR: 1.35
Prothrombin Time: 16.6 seconds — ABNORMAL HIGH (ref 11.4–15.2)

## 2017-08-16 MED ORDER — VANCOMYCIN HCL IN DEXTROSE 1-5 GM/200ML-% IV SOLN
1000.0000 mg | Freq: Once | INTRAVENOUS | Status: AC
  Administered 2017-08-16: 1000 mg via INTRAVENOUS
  Filled 2017-08-16: qty 200

## 2017-08-16 MED ORDER — GENERIC EXTERNAL MEDICATION
150.00 | Status: DC
Start: 2017-08-16 — End: 2017-08-16

## 2017-08-16 MED ORDER — BENZONATATE 100 MG PO CAPS
100.00 | ORAL_CAPSULE | ORAL | Status: DC
Start: ? — End: 2017-08-16

## 2017-08-16 MED ORDER — ONDANSETRON HCL 4 MG/2ML IJ SOLN
4.0000 mg | Freq: Once | INTRAMUSCULAR | Status: AC
  Administered 2017-08-16: 4 mg via INTRAVENOUS
  Filled 2017-08-16: qty 2

## 2017-08-16 MED ORDER — MOMETASONE FURO-FORMOTEROL FUM 200-5 MCG/ACT IN AERO
2.00 | INHALATION_SPRAY | RESPIRATORY_TRACT | Status: DC
Start: 2017-08-15 — End: 2017-08-16

## 2017-08-16 MED ORDER — HYDROCORTISONE 10 MG PO TABS
10.00 | ORAL_TABLET | ORAL | Status: DC
Start: 2017-08-16 — End: 2017-08-16

## 2017-08-16 MED ORDER — HYDROCORTISONE 5 MG PO TABS
5.00 | ORAL_TABLET | ORAL | Status: DC
Start: 2017-08-15 — End: 2017-08-16

## 2017-08-16 MED ORDER — PANTOPRAZOLE SODIUM 40 MG PO TBEC
40.00 | DELAYED_RELEASE_TABLET | ORAL | Status: DC
Start: 2017-08-15 — End: 2017-08-16

## 2017-08-16 MED ORDER — PIPERACILLIN-TAZOBACTAM 3.375 G IVPB 30 MIN
3.3750 g | Freq: Once | INTRAVENOUS | Status: AC
  Administered 2017-08-16: 3.375 g via INTRAVENOUS
  Filled 2017-08-16 (×2): qty 50

## 2017-08-16 MED ORDER — TRAMADOL HCL 50 MG PO TABS
50.00 | ORAL_TABLET | ORAL | Status: DC
Start: ? — End: 2017-08-16

## 2017-08-16 MED ORDER — ENOXAPARIN SODIUM 80 MG/0.8ML ~~LOC~~ SOLN
70.00 | SUBCUTANEOUS | Status: DC
Start: 2017-08-15 — End: 2017-08-16

## 2017-08-16 MED ORDER — IOPAMIDOL (ISOVUE-370) INJECTION 76%
100.0000 mL | Freq: Once | INTRAVENOUS | Status: AC | PRN
  Administered 2017-08-16: 100 mL via INTRAVENOUS

## 2017-08-16 MED ORDER — FENTANYL CITRATE (PF) 100 MCG/2ML IJ SOLN
100.0000 ug | Freq: Once | INTRAMUSCULAR | Status: AC
  Administered 2017-08-16: 100 ug via INTRAVENOUS

## 2017-08-16 MED ORDER — SILVER NITRATE-POT NITRATE 75-25 % EX MISC
CUTANEOUS | Status: AC
  Administered 2017-08-16: 03:00:00
  Filled 2017-08-16: qty 3

## 2017-08-16 MED ORDER — BUMETANIDE 2 MG PO TABS
2.00 | ORAL_TABLET | ORAL | Status: DC
Start: 2017-08-15 — End: 2017-08-16

## 2017-08-16 MED ORDER — ACETAMINOPHEN 325 MG PO TABS
650.00 | ORAL_TABLET | ORAL | Status: DC
Start: ? — End: 2017-08-16

## 2017-08-16 MED ORDER — FENTANYL CITRATE (PF) 100 MCG/2ML IJ SOLN
50.0000 ug | INTRAMUSCULAR | Status: DC | PRN
  Administered 2017-08-16: 50 ug via INTRAVENOUS
  Filled 2017-08-16: qty 2

## 2017-08-16 MED ORDER — WARFARIN SODIUM 5 MG PO TABS
5.00 | ORAL_TABLET | ORAL | Status: DC
Start: ? — End: 2017-08-16

## 2017-08-16 MED ORDER — TIOTROPIUM BROMIDE MONOHYDRATE 18 MCG IN CAPS
18.00 | ORAL_CAPSULE | RESPIRATORY_TRACT | Status: DC
Start: 2017-08-16 — End: 2017-08-16

## 2017-08-16 MED ORDER — FENTANYL CITRATE (PF) 100 MCG/2ML IJ SOLN
INTRAMUSCULAR | Status: AC
  Filled 2017-08-16: qty 2

## 2017-08-16 MED ORDER — ALBUTEROL SULFATE HFA 108 (90 BASE) MCG/ACT IN AERS
2.00 | INHALATION_SPRAY | RESPIRATORY_TRACT | Status: DC
Start: ? — End: 2017-08-16

## 2017-08-16 MED ORDER — LACTULOSE 10 GM/15ML PO SOLN
30.00 | ORAL | Status: DC
Start: 2017-08-15 — End: 2017-08-16

## 2017-08-16 MED ORDER — MELATONIN 3 MG PO TABS
3.00 | ORAL_TABLET | ORAL | Status: DC
Start: ? — End: 2017-08-16

## 2017-08-16 MED ORDER — IPRATROPIUM-ALBUTEROL 0.5-2.5 (3) MG/3ML IN SOLN
3.00 | RESPIRATORY_TRACT | Status: DC
Start: ? — End: 2017-08-16

## 2017-08-16 NOTE — ED Notes (Addendum)
EMT went to put patient on the monitor and the hematoma began squirting blood.  Nurse at bedside with tech and put combat gauze and large dressing on wound.

## 2017-08-16 NOTE — ED Triage Notes (Addendum)
Pt presents with pain and hematoma to right lower leg. Pt is currently taking coumadin and lovenox for dvt. PT was discharged today from [facility] with quarter size hematoma to right lateral leg . Pt states it has grown in size since she left and has 10/10 pain. Large hematoma to right lateral lower leg. PT was told to come back to [facility] if it grew in size to have hematoma evacuated.

## 2017-08-16 NOTE — ED Notes (Signed)
No new blood on dressing or chuck pad under leg.

## 2017-08-16 NOTE — ED Notes (Signed)
Hematoma began oozing serosanguinous fluid chuck pad placed under leg.

## 2017-08-16 NOTE — ED Provider Notes (Signed)
[facility] Provider Note   CSN: [id number] Arrival date & time: 08/16/17  0129     History   Chief Complaint Chief Complaint  Patient presents with  . Leg Pain    HPI Alicia [name] is a 56 y.o. female.  The history is provided by the patient.  Leg Pain   This is a new problem. The current episode started 6 to 12 hours ago. The problem occurs constantly. The problem has been gradually worsening. Pain location: right lateral shin. The quality of the pain is described as sharp (spasmotic ). The pain is at a severity of 10/10. The pain is severe. Pertinent negatives include no numbness. Associated symptoms comments: Serous blister that was the size of a quarter while at [facility] that enlarged markedly this evening and then the patient coughed and serous fluid and blood came out.  Also has distal redness.  She is on the transplant list at [facility] and was told to come in.  She is also on both coumadin and Lovenox.  . She has tried nothing for the symptoms. The treatment provided no relief. History of extremity trauma: gentle tap to the lateral shin at [facility] this am  Family history is significant for no gout.    Past Medical History:  Diagnosis Date  . Allergy   . Anemia   . Asthma   . Cellulitis   . Cirrhosis ([place])   . GERD (gastroesophageal reflux disease)   . Hepatic fibrosis    congenital  . History of UTI    chronic as a child  . Sarcoidosis   . Spleen enlarged     Patient Active Problem List   Diagnosis Date Noted  . Syncope 07/17/2017  . Other cirrhosis of liver ([place]) 05/10/2017  . Immunocompromised patient ([place]) 05/10/2017  . Mucous cyst of digit of left hand 04/11/2017  . Cellulitis, abdominal wall 03/01/2017  . Normocytic anemia 03/01/2017  . Hypokalemia 03/01/2017  . CKD (chronic kidney disease), stage III ([place]) 03/01/2017  . Abdominal wall cellulitis 03/01/2017  . Adrenal insufficiency ([place]) 03/01/2017  . Hyperammonemia ([place])   .  Cellulitis 06/28/2016  . Thrombocytopenia ([place]) 06/28/2016  . AKI (acute kidney injury) ([place]) 06/28/2016  . Multiple pulmonary nodules determined by computed tomography of lung   . Chronic cough 09/07/2015  . Hypertension, portal ([place]) 09/04/2011  . Asthma with chronic obstructive pulmonary disease (COPD) ([place]) 09/04/2011  . 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 GERD (gastroesophageal reflux disease) 07/04/2011  . Sarcoidosis 07/01/2011  . Asthma, severe persistent, with upper airway instability 06/13/2011  . 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 COLONOSCOPY    . ESOPHAGOGASTRODUODENOSCOPY    . LEFT INDEX FINGER CYST EXCISION Left 05/01/2017   Performed by Leandrew Koyanagi, MD at [place]  . SPINAL FUSION  12/2007   Double spinal Fusion  . VIDEO BRONCHOSCOPY WITH FLUORO Bilateral 12/08/2015   Performed by Rigoberto Noel, MD at [facility] ENDOSCOPY    OB History    No data available       Home Medications    Prior to Admission medications   Medication Sig Start Date End Date Taking? Authorizing Provider  albuterol (PROVENTIL HFA;VENTOLIN HFA) 108 (90 Base) MCG/ACT inhaler Inhale 1-2 puffs into the lungs every 6 (six) hours as needed for wheezing or shortness of breath. 10/19/16   Amalio Loe, MD  benzonatate (TESSALON) 100 MG capsule Take 100 mg by mouth  3 (three) times daily as needed for cough.  01/30/17   [provider]  DULERA 200-5 MCG/ACT AERO INHALE 2 PUFFS BY MOUTH TWO TIMES DAILY. 03/17/17   Rigoberto Noel, MD  furosemide (LASIX) 40 MG tablet Take 1 tablet (40 mg total) by mouth 2 (two) times daily. Patient taking differently: Take 120 mg by mouth 2 (two) times daily.  05/15/17   Regalado, Belkys A, MD  ipratropium-albuterol (DUONEB) 0.5-2.5 (3) MG/3ML SOLN Take 3 mLs by nebulization every 4 (four) hours as needed. 06/30/17   Copland, Gay Filler, MD  KLOR-CON M20 20 MEQ tablet TAKE 3 DOSES OF 40 MEQ (2 PILLS) OVER THE NEXT 36  HOURS. THEN RECHECK POTASSIUM LEVEL 07/02/17   Copland, Gay Filler, MD  lactulose (CHRONULAC) 10 GM/15ML solution Take 30 g by mouth 2 (two) times daily.  06/27/16   [provider]  magic mouthwash SOLN Take 5 mLs by mouth 3 (three) times daily. 05/15/17   Regalado, Belkys A, MD  potassium chloride (K-DUR) 10 MEQ tablet Take 2 tablets (20 mEq total) by mouth daily. 05/15/17   Regalado, Belkys A, MD  predniSONE (DELTASONE) 1 MG tablet Take 1 mg by mouth every other day.  01/27/17   [provider]  ranitidine (ZANTAC) 300 MG tablet TAKE 1 TABLET AT BEDTIME 06/26/17   Parrett, Fonnie Mu, NP  saccharomyces boulardii (FLORASTOR) 250 MG capsule Take 1 capsule (250 mg total) by mouth 2 (two) times daily. 05/15/17   Regalado, Jerald Kief A, MD  SPIRIVA RESPIMAT 2.5 MCG/ACT AERS Inhale 2 puffs into the lungs daily at 2 PM.  01/27/17   [provider]  spironolactone (ALDACTONE) 100 MG tablet Take 300 mg by mouth daily.     [provider]    Family History Family History  Problem Relation Age of Onset  . Arthritis Mother   . Heart disease Father        Triple bypass  . Migraines Father   . Heart attack Paternal Grandmother   . Stroke Paternal Grandfather   . Liver disease Sister        Juvenile cysitc liver disease  . Asthma Sister     Social History Social History   Tobacco Use  . Smoking status: Never Smoker  . Smokeless tobacco: Never Used  Substance Use Topics  . Alcohol use: No  . Drug use: No     Allergies   Aspirin; Peanut-containing drug products; Gabapentin; and Azithromycin   Review of Systems Review of Systems  Constitutional: Negative for fever.  Cardiovascular: Positive for leg swelling.  Musculoskeletal: Positive for arthralgias and myalgias. Negative for back pain.  Skin: Positive for color change and wound.  Neurological: Negative for numbness.  Hematological: Bruises/bleeds easily.       Low platelets on coumadin and lovenox  All other  systems reviewed and are negative.    Physical Exam Updated Vital Signs BP 127/69   Pulse 80   Temp 98.2 F (36.8 C) (Oral)   Resp 15   SpO2 94%   Physical Exam  Constitutional: She is oriented to person, place, and time. She appears well-developed and well-nourished.  Appears to be in severe pain   HENT:  Head: Normocephalic and atraumatic.  Nose: Nose normal.  Mouth/Throat: No oropharyngeal exudate.  Eyes: Conjunctivae are normal. Pupils are equal, round, and reactive to light.  Neck: Normal range of motion. Neck supple. No JVD present.  Cardiovascular: Normal rate, regular rhythm, normal heart sounds and intact distal  pulses.  Pulmonary/Chest: Effort normal and breath sounds normal. No stridor. No respiratory distress. She has no wheezes. She has no rales.  Abdominal: Soft. Bowel sounds are normal. She exhibits distension and fluid wave. She exhibits no mass. There is no guarding.  Musculoskeletal:       Right lower leg: She exhibits tenderness and swelling.       Legs:      Right foot: There is normal capillary refill, no crepitus and no deformity.  3+ dorsalis pedis, all compartments soft.    Neurological: She is alert and oriented to person, place, and time. She displays normal reflexes.  Skin: Skin is warm and dry. Capillary refill takes less than 2 seconds.  Psychiatric: She has a normal mood and affect.     ED Treatments / Results   Vitals:   08/16/17 0330 08/16/17 0345  BP: 127/69 128/75  Pulse: 80 83  Resp: 15 18  Temp:    SpO2: 94% 96%    Labs (all labs ordered are listed, but only abnormal results are displayed)  Results for orders placed or performed during the hospital encounter of 08/16/17  CBC with Differential/Platelet  Result Value Ref Range   WBC 7.1 4.0 - 10.5 K/uL   RBC 3.12 (L) 3.87 - 5.11 MIL/uL   Hemoglobin 10.5 (L) 12.0 - 15.0 g/dL   HCT 30.7 (L) 36.0 - 46.0 %   MCV 98.4 78.0 - 100.0 fL   MCH 33.7 26.0 - 34.0 pg   MCHC 34.2 30.0 -  36.0 g/dL   RDW 14.4 11.5 - 15.5 %   Platelets 122 (L) 150 - 400 K/uL   Neutrophils Relative % 72 %   Neutro Abs 5.1 1.7 - 7.7 K/uL   Lymphocytes Relative 14 %   Lymphs Abs 1.0 0.7 - 4.0 K/uL   Monocytes Relative 13 %   Monocytes Absolute 0.9 0.1 - 1.0 K/uL   Eosinophils Relative 1 %   Eosinophils Absolute 0.0 0.0 - 0.7 K/uL   Basophils Relative 0 %   Basophils Absolute 0.0 0.0 - 0.1 K/uL  Comprehensive metabolic panel  Result Value Ref Range   Sodium 131 (L) 135 - 145 mmol/L   Potassium 3.7 3.5 - 5.1 mmol/L   Chloride 99 (L) 101 - 111 mmol/L   CO2 25 22 - 32 mmol/L   Glucose, Bld 154 (H) 65 - 99 mg/dL   BUN 24 (H) 6 - 20 mg/dL   Creatinine, Ser 1.04 (H) 0.44 - 1.00 mg/dL   Calcium 8.4 (L) 8.9 - 10.3 mg/dL   Total Protein 6.8 6.5 - 8.1 g/dL   Albumin 3.5 3.5 - 5.0 g/dL   AST 51 (H) 15 - 41 U/L   ALT 44 14 - 54 U/L   Alkaline Phosphatase 177 (H) 38 - 126 U/L   Total Bilirubin 2.4 (H) 0.3 - 1.2 mg/dL   GFR calc non Af Amer 58 (L) >60 mL/min   GFR calc Af Amer >60 >60 mL/min   Anion gap 7 5 - 15  Protime-INR  Result Value Ref Range   Prothrombin Time 16.6 (H) 11.4 - 15.2 seconds   INR 1.35    Dg Chest 2 View  Result Date: 07/17/2017 CLINICAL DATA:  Syncopal episode this morning. Dizziness. History of sarcoidosis and hepatic fibrosis. EXAM: CHEST  2 VIEW COMPARISON:  Chest radiograph May 18, 2017 and CT chest October 27, 2016 FINDINGS: Cardiac silhouette is upper limits normal in size mediastinal silhouette is nonsuspicious. New small  LEFT pleural effusion. Diffuse interstitial prominence. No focal consolidation. No pneumothorax. ACDF. Soft tissue planes and included osseous structures are nonsuspicious. IMPRESSION: New small pleural effusion. Borderline cardiomegaly. Interstitial prominence, increased from prior chest CT concerning for atypical pneumonia or pulmonary edema and, underlying sarcoidosis. Electronically Signed   By: Elon Alas M.D.   On: 07/17/2017 19:44    ' Procedures Cauterization Date/Time: 08/16/2017 4:03 AM Performed by: Veatrice Kells, MD Authorized by: Veatrice Kells, MD  Consent: Verbal consent obtained. Consent given by: patient Patient understanding: patient states understanding of the procedure being performed Patient identity confirmed: arm band Preparation: Patient was prepped and draped in the usual sterile fashion. Local anesthesia used: no  Anesthesia: Local anesthesia used: no Comments: Dressing removed to examine the area and the medial wound burst open.  First serous drainage then BRB. Decompression of the area.  Silver nitrate to the wound.  Began to refill pressure dressing placed.      (including critical care time)  Medications Ordered in ED  Medications  fentaNYL (SUBLIMAZE) injection 50 mcg (50 mcg Intravenous Given 08/16/17 0234)  fentaNYL (SUBLIMAZE) 100 MCG/2ML injection (not administered)  vancomycin (VANCOCIN) IVPB 1000 mg/200 mL premix (1,000 mg Intravenous New Bag/Given 08/16/17 0354)  piperacillin-tazobactam (ZOSYN) IVPB 3.375 g (3.375 g Intravenous New Bag/Given 08/16/17 0353)  ondansetron (ZOFRAN) injection 4 mg (4 mg Intravenous Given 08/16/17 0234)  iopamidol (ISOVUE-370) 76 % injection 100 mL (100 mLs Intravenous Contrast Given 08/16/17 0316)  silver nitrate applicators 20-94 % applicator (  Given 70/96/28 0306)  fentaNYL (SUBLIMAZE) injection 100 mcg (100 mcg Intravenous Given 08/16/17 0306)   336  Case d/w Carlie of the transplant team via phone.  Dr.  Sherrie Mustache to accept.  Please call transfer center and send to the ED for vascular team to evacuate  Will start antibiotics secondary to redness of the leg and risk of infection.  Patient's tetanus is up to date  Wet read on CT by Dr. Radene Knee of radiology, active extravasation of blood into tissues.  No edema of the deep compartments inflammation of the skin consistent with early infection.     Final Clinical Impressions(s) / ED Diagnoses  EBL  witnessed ~ 50 cc.    Pressure dressing placed.  Will need transfer to the [facility] to the liver service for evacuation and examination.     Reginold Beale, MD 08/16/17 [phone]

## 2017-08-16 NOTE — ED Notes (Signed)
EDP, EMT and RN at bedside for wound check.  Upon slight removal of combat gauze, serous fluid sprayed out, followed by sanguinous fluid pouring out of wound.  Wound decompressed with pressure and dressing reapplied.  Distal and proximal pulses present

## 2017-08-25 ENCOUNTER — Other Ambulatory Visit (INDEPENDENT_AMBULATORY_CARE_PROVIDER_SITE_OTHER): Payer: BLUE CROSS/BLUE SHIELD

## 2017-08-25 DIAGNOSIS — Z7901 Long term (current) use of anticoagulants: Secondary | ICD-10-CM

## 2017-08-25 DIAGNOSIS — K7210 Chronic hepatic failure without coma: Secondary | ICD-10-CM | POA: Diagnosis not present

## 2017-08-25 NOTE — Addendum Note (Signed)
Addended by: Harl Bowie on: 08/25/2017 03:45 PM   Modules accepted: Orders

## 2017-08-25 NOTE — Addendum Note (Signed)
Addended by: Harl Bowie on: 08/25/2017 02:52 PM   Modules accepted: Orders

## 2017-08-26 LAB — COMPREHENSIVE METABOLIC PANEL
ALBUMIN: 2.8 g/dL — AB (ref 3.5–5.2)
ALT: 67 U/L — ABNORMAL HIGH (ref 0–35)
AST: 37 U/L (ref 0–37)
Alkaline Phosphatase: 198 U/L — ABNORMAL HIGH (ref 39–117)
BUN: 17 mg/dL (ref 6–23)
CHLORIDE: 98 meq/L (ref 96–112)
CO2: 32 mEq/L (ref 19–32)
CREATININE: 0.94 mg/dL (ref 0.40–1.20)
Calcium: 8.3 mg/dL — ABNORMAL LOW (ref 8.4–10.5)
GFR: 64.83 mL/min (ref >60.00)
GLUCOSE: 292 mg/dL — AB (ref 70–99)
POTASSIUM: 4.4 meq/L (ref 3.5–5.1)
SODIUM: 133 meq/L — AB (ref 135–145)
TOTAL PROTEIN: 5.4 g/dL — AB (ref 6.0–8.3)
Total Bilirubin: 1.4 mg/dL — ABNORMAL HIGH (ref 0.2–1.2)

## 2017-08-26 LAB — PROTIME-INR
INR: 1.7 ratio — ABNORMAL HIGH (ref 0.8–1.0)
PROTHROMBIN TIME: 17.8 s — AB (ref 9.6–13.1)

## 2017-08-27 ENCOUNTER — Inpatient Hospital Stay: Payer: BLUE CROSS/BLUE SHIELD | Admitting: Family Medicine

## 2017-08-27 ENCOUNTER — Other Ambulatory Visit (INDEPENDENT_AMBULATORY_CARE_PROVIDER_SITE_OTHER): Payer: BLUE CROSS/BLUE SHIELD

## 2017-08-27 DIAGNOSIS — K7210 Chronic hepatic failure without coma: Secondary | ICD-10-CM | POA: Diagnosis not present

## 2017-08-27 DIAGNOSIS — K7200 Acute and subacute hepatic failure without coma: Secondary | ICD-10-CM | POA: Diagnosis not present

## 2017-08-27 LAB — COMPREHENSIVE METABOLIC PANEL
ALBUMIN: 2.8 g/dL — AB (ref 3.5–5.2)
ALT: 54 U/L — AB (ref 0–35)
AST: 28 U/L (ref 0–37)
Alkaline Phosphatase: 190 U/L — ABNORMAL HIGH (ref 39–117)
BUN: 25 mg/dL — ABNORMAL HIGH (ref 6–23)
CALCIUM: 8.6 mg/dL (ref 8.4–10.5)
CO2: 31 mEq/L (ref 19–32)
Chloride: 85 mEq/L — ABNORMAL LOW (ref 96–112)
Creatinine, Ser: 1.28 mg/dL — ABNORMAL HIGH (ref 0.40–1.20)
GFR: 45.4 mL/min — AB (ref >60.00)
Glucose, Bld: 378 mg/dL — ABNORMAL HIGH (ref 70–99)
Potassium: 3.8 mEq/L (ref 3.5–5.1)
Sodium: 123 mEq/L — ABNORMAL LOW (ref 135–145)
TOTAL PROTEIN: 5.5 g/dL — AB (ref 6.0–8.3)
Total Bilirubin: 2.3 mg/dL — ABNORMAL HIGH (ref 0.2–1.2)

## 2017-08-27 LAB — PROTIME-INR
INR: 2.3 ratio — AB (ref 0.8–1.0)
Prothrombin Time: 25.1 s — ABNORMAL HIGH (ref 9.6–13.1)

## 2017-08-27 NOTE — Progress Notes (Addendum)
[facility] [address] [place], [place] [address] [phone]  Date:  08/28/2017   Name:  Alicia [name]   DOB:  Mar 28, 1961   MRN:  [id number]  PCP:  Darreld Mclean, MD    Chief Complaint: Hospitalization Follow-up (Pt here for hosp f/u visit. )   History of Present Illness:  Alicia [name] is a 56 y.o. 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  Just checking in with her, labs are worse than normal.  As long as she is feeling ok we should be all right to visit tomorrow but if not feeling well go to ER  I last saw her in October- she has been in the hospital at [facility] most of the time since we last saw each other Most recently she was discharged to home about one week ago She has had a complex recent medical course- cellulitis of left leg in August which required IV abx, then hospitalized later in August due to syncope/ hypovolemia. Over the last month she developed a DVT, a hematoma on her right leg which required a wound vac, and severe fluid retention requiring aggressive diuresis  She is not currently using lasix, she is using Bumex and is adjusting the dose daily for her weight. She is using metolazone once daily- Dr. Edwin Dada, her cardiologist at [facility], is communicating with her on a daily basis about this medication Enis Gash is her lead transplant doc back at [facility] She did have an echo at [facility] on 11/5- EF 55  She is using coumadin for her DVT- she is now therapeutic and stopped lovenox  Her main concern today is that her left leg cellulitis may be coming back-  She developed  redness of her left leg about 36 hours ago- she had a temp of 100.3 last night.  She used tylenol this am which did help with her pain and fever.  However the redness is worse since yesterday.  The leg is swollen but not worse than before, and is tender She does not feel terrible but also does not feel great.  Her left leg is painful  When she was admitted back in August with cellulitis of her left leg she was treated inpt with vanc and zosyn, then she was released home with doxycycline    Wt Readings from Last 3 Encounters:  08/28/17 149 lb 3.2 oz (67.7 kg)  07/17/17 151 lb (68.5 kg)  07/17/17 151 lb 6.4 oz (68.7 kg)   Her weight is down but she continues to have LE edema Patient Active Problem List   Diagnosis Date Noted  . Syncope 07/17/2017  . Other cirrhosis of liver ([place]) 05/10/2017  . Immunocompromised patient ([place]) 05/10/2017  . Mucous cyst of digit of left hand 04/11/2017  . Cellulitis, abdominal wall 03/01/2017  . Normocytic anemia 03/01/2017  . Hypokalemia 03/01/2017  . CKD (chronic kidney disease), stage III ([place]) 03/01/2017  . Abdominal wall cellulitis 03/01/2017  . Adrenal insufficiency ([place]) 03/01/2017  . Hyperammonemia ([place])   . Cellulitis 06/28/2016  . Thrombocytopenia ([place]) 06/28/2016  . AKI (acute kidney injury) ([place]) 06/28/2016  .  Multiple pulmonary nodules determined by computed tomography of lung   . Chronic cough 09/07/2015  . Hypertension, portal ([place]) 09/04/2011  . Asthma with chronic obstructive pulmonary disease (COPD) ([place]) 09/04/2011  . 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 GERD (gastroesophageal reflux disease) 07/04/2011  . Sarcoidosis 07/01/2011  . Asthma, severe persistent, with upper airway instability 06/13/2011  . 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 Allergy   . Anemia   . Asthma   . Cellulitis   . Cirrhosis ([place])   . GERD (gastroesophageal reflux disease)   . Hepatic fibrosis    congenital   . History of UTI    chronic as a child  . Sarcoidosis   . Spleen enlarged     Past Surgical History:  Procedure Laterality Date  . COLONOSCOPY    . CYST EXCISION Left 05/01/2017   Procedure: LEFT INDEX FINGER CYST EXCISION;  Surgeon: Leandrew Koyanagi, MD;  Location: [facility] [facility];  Service: Orthopedics;  Laterality: Left;  . ESOPHAGOGASTRODUODENOSCOPY    . SPINAL FUSION  12/2007   Double spinal Fusion  . VIDEO BRONCHOSCOPY Bilateral 12/08/2015   Procedure: VIDEO BRONCHOSCOPY WITH FLUORO;  Surgeon: Rigoberto Noel, MD;  Location: [facility];  Service: Cardiopulmonary;  Laterality: Bilateral;    Social History   Tobacco Use  . Smoking status: Never Smoker  . Smokeless tobacco: Never Used  Substance Use Topics  . Alcohol use: No  . Drug use: No    Family History  Problem Relation Age of Onset  . Arthritis Mother   . Heart disease Father        Triple bypass  . Migraines Father   . Heart attack Paternal Grandmother   . Stroke Paternal Grandfather   . Liver disease Sister        Juvenile cysitc liver disease  . Asthma Sister     Allergies  Allergen Reactions  . Aspirin Other (See Comments)    Causes Asthma  . Peanut-Containing Drug Products Shortness Of Breath    Asthma, eye swelling, itchy eyes, can't catch her breath  . Gabapentin Other (See Comments)    Blisters in mouth, nausea   . Azithromycin Nausea And Vomiting    Medication list has been reviewed and updated.  Current Outpatient Medications on File Prior to Visit  Medication Sig Dispense Refill  . albuterol (PROVENTIL HFA;VENTOLIN HFA) 108 (90 Base) MCG/ACT inhaler Inhale 1-2 puffs into the lungs every 6 (six) hours as needed for wheezing or shortness of breath. 1 Inhaler 0  . benzonatate (TESSALON) 100 MG capsule Take 100 mg by mouth 3 (three) times daily as needed for cough.   2  . DULERA 200-5 MCG/ACT AERO INHALE 2 PUFFS BY MOUTH TWO TIMES DAILY. 13 Inhaler 0  . furosemide (LASIX) 40 MG tablet Take 1 tablet (40 mg  total) by mouth 2 (two) times daily. (Patient taking differently: Take 120 mg by mouth 2 (two) times daily. ) 30 tablet 0  . ipratropium-albuterol (DUONEB) 0.5-2.5 (3) MG/3ML SOLN Take 3 mLs by nebulization every 4 (four) hours as needed. 360 mL 0  . KLOR-CON M20 20 MEQ tablet TAKE 3 DOSES OF 40 MEQ (2 PILLS) OVER THE NEXT 36 HOURS. THEN RECHECK POTASSIUM LEVEL 30 tablet 3  . lactulose (CHRONULAC) 10 GM/15ML solution Take 30 g by mouth 2 (two) times daily.     . magic mouthwash SOLN Take 5 mLs by mouth 3 (three) times daily. 60 mL  0  . potassium chloride (K-DUR) 10 MEQ tablet Take 2 tablets (20 mEq total) by mouth daily. 6 tablet 0  . predniSONE (DELTASONE) 1 MG tablet Take 1 mg by mouth every other day.   0  . ranitidine (ZANTAC) 300 MG tablet TAKE 1 TABLET AT BEDTIME 90 tablet 0  . saccharomyces boulardii (FLORASTOR) 250 MG capsule Take 1 capsule (250 mg total) by mouth 2 (two) times daily. 30 capsule 0  . SPIRIVA RESPIMAT 2.5 MCG/ACT AERS Inhale 2 puffs into the lungs daily at 2 PM.   11  . spironolactone (ALDACTONE) 100 MG tablet Take 300 mg by mouth daily.      No current facility-administered medications on file prior to visit.     Review of Systems:  As per HPI- otherwise negative.   Physical Examination: Vitals:   08/28/17 1144  BP: 122/62  Pulse: 98  Temp: 98.8 F (37.1 C)  SpO2: 98%   Vitals:   08/28/17 1144  Weight: 149 lb 3.2 oz (67.7 kg)   Body mass index is 27.29 kg/m. Ideal Body Weight:    GEN: WDWN, NAD, Non-toxic, A & O x 3, appears herself, chronically ill but cheerful HEENT: Atraumatic, Normocephalic. Neck supple. No masses, No LAD.  Bilateral TM wnl, oropharynx normal.  PEERL,EOMI.   Ears and Nose: No external deformity. CV: RRR, No M/G/R. No JVD. No thrill. No extra heart sounds. PULM: CTA B, no wheezes, crackles, rhonchi. No retractions. No resp. distress. No accessory muscle use. EXTR: No c/c/e NEURO Normal gait.  PSYCH: Normally interactive.  Conversant. Not depressed or anxious appearing.  Calm demeanor.  Here today with her husband She has a wound vac in the right lateral calf The left foot and ankle appears mildly cellulitic- redness is outlined today. She also has diffuse, soft pitting edema of both LE, seems worse on the left.  There is a small puncture over the proximal tib which is leaking clear fluid- this resulted from a cat claw scratch Assessment and Plan: Anticoagulated on Coumadin - Plan: Protime-INR  Hyponatremia - Plan: Basic metabolic panel  Hyperglycemia - Plan: Hemoglobin A1c  Cellulitis of left leg - Plan: CBC, doxycycline (VIBRAMYCIN) 100 MG capsule  Congenital cirrhosis  Fluid retention  Here today with recurrent cellulitis of her left leg Her situation is complicated by her severe liver disease, DVT requiring coumadin and edema requiring diuresis at this time.  We planned to start her on oral doxycycline with close follow-up, but if any concerning lab findings she may need to be admitted. Pt and her husband state understanding   Signed Lamar Blinks, MD 4:30 pm-  Received her labs- I am concerned about significant leukocytosis.  I am afraid that oral doxycycline will not be enough. Called to discuss with Dr. Loletha Grayer who agrees that pt needs admission.  Called pt and her husband and LMOM on both their cells phones to this effect. Will try them back  Received a call from pt about 9pm- they are on their way to [facility].  They will keep me posted  Sodium corrects to 130 which is better Results for orders placed or performed in visit on 08/28/17  Protime-INR  Result Value Ref Range   INR 3.2 (H) 0.8 - 1.0 ratio   Prothrombin Time 33.7 (H) 9.6 - 13.1 sec  Basic metabolic panel  Result Value Ref Range   Sodium 127 (L) 135 - 145 mEq/L   Potassium 5.0 3.5 - 5.1 mEq/L   Chloride 83 (L)  96 - 112 mEq/L   CO2 35 (H) 19 - 32 mEq/L   Glucose, Bld 279 (H) 70 - 99 mg/dL   BUN 34 (H) 6 - 23 mg/dL   Creatinine, Ser  1.53 (H) 0.40 - 1.20 mg/dL   Calcium 9.4 8.4 - 10.5 mg/dL   GFR 36.95 (L) >60.00 mL/min  Hemoglobin A1c  Result Value Ref Range   Hgb A1c MFr Bld 5.2 4.6 - 6.5 %  CBC  Result Value Ref Range   WBC 16.8 (H) 4.0 - 10.5 K/uL   RBC 3.09 (L) 3.87 - 5.11 Mil/uL   Platelets 105.0 (L) 150.0 - 400.0 K/uL   Hemoglobin 10.2 (L) 12.0 - 15.0 g/dL   HCT 30.0 (L) 36.0 - 46.0 %   MCV 96.8 78.0 - 100.0 fl   MCHC 34.0 30.0 - 36.0 g/dL   RDW 16.8 (H) 11.5 - 15.5 %

## 2017-08-27 NOTE — Progress Notes (Signed)
Results faxed per provider request.

## 2017-08-28 ENCOUNTER — Ambulatory Visit (INDEPENDENT_AMBULATORY_CARE_PROVIDER_SITE_OTHER): Payer: BLUE CROSS/BLUE SHIELD | Admitting: Family Medicine

## 2017-08-28 ENCOUNTER — Encounter: Payer: Self-pay | Admitting: Family Medicine

## 2017-08-28 VITALS — BP 122/62 | HR 98 | Temp 98.8°F | Wt 149.2 lb

## 2017-08-28 DIAGNOSIS — E871 Hypo-osmolality and hyponatremia: Secondary | ICD-10-CM

## 2017-08-28 DIAGNOSIS — Z5181 Encounter for therapeutic drug level monitoring: Secondary | ICD-10-CM

## 2017-08-28 DIAGNOSIS — L03116 Cellulitis of left lower limb: Secondary | ICD-10-CM

## 2017-08-28 DIAGNOSIS — R739 Hyperglycemia, unspecified: Secondary | ICD-10-CM

## 2017-08-28 DIAGNOSIS — Z7901 Long term (current) use of anticoagulants: Secondary | ICD-10-CM

## 2017-08-28 DIAGNOSIS — R609 Edema, unspecified: Secondary | ICD-10-CM

## 2017-08-28 LAB — BASIC METABOLIC PANEL
BUN: 34 mg/dL — AB (ref 6–23)
CALCIUM: 9.4 mg/dL (ref 8.4–10.5)
CO2: 35 mEq/L — ABNORMAL HIGH (ref 19–32)
CREATININE: 1.53 mg/dL — AB (ref 0.40–1.20)
Chloride: 83 mEq/L — ABNORMAL LOW (ref 96–112)
GFR: 36.95 mL/min — AB (ref >60.00)
GLUCOSE: 279 mg/dL — AB (ref 70–99)
Potassium: 5 mEq/L (ref 3.5–5.1)
Sodium: 127 mEq/L — ABNORMAL LOW (ref 135–145)

## 2017-08-28 LAB — CBC
HCT: 30 % — ABNORMAL LOW (ref 36.0–46.0)
Hemoglobin: 10.2 g/dL — ABNORMAL LOW (ref 12.0–15.0)
MCHC: 34 g/dL (ref 30.0–36.0)
MCV: 96.8 fl (ref 78.0–100.0)
Platelets: 105 10*3/uL — ABNORMAL LOW (ref 150.0–400.0)
RBC: 3.09 Mil/uL — ABNORMAL LOW (ref 3.87–5.11)
RDW: 16.8 % — ABNORMAL HIGH (ref 11.5–15.5)
WBC: 16.8 10*3/uL — ABNORMAL HIGH (ref 4.0–10.5)

## 2017-08-28 LAB — HEMOGLOBIN A1C: Hgb A1c MFr Bld: 5.2 % (ref 4.6–6.5)

## 2017-08-28 LAB — PROTIME-INR
INR: 3.2 ratio — ABNORMAL HIGH (ref 0.8–1.0)
Prothrombin Time: 33.7 s — ABNORMAL HIGH (ref 9.6–13.1)

## 2017-08-28 MED ORDER — DOXYCYCLINE HYCLATE 100 MG PO CAPS: 100.0000 mg | ORAL_CAPSULE | Freq: Two times a day (BID) | ORAL | 0 refills | Status: DC

## 2017-08-28 NOTE — Patient Instructions (Signed)
It was good to see you again today- I am sorry that you have been so sick!  Please see me on Monday- if your leg does not seem to be responding to the antibiotic please do go to [facility] for further treatment/ likely IV antibiotics We are going to try and use doxycycline by mouth to treat this infection I am getting some stat labs today and will be in touch with them asap

## 2017-09-01 ENCOUNTER — Ambulatory Visit: Payer: BLUE CROSS/BLUE SHIELD | Admitting: Family Medicine

## 2017-09-14 ENCOUNTER — Encounter: Payer: Self-pay | Admitting: Family Medicine

## 2017-09-16 ENCOUNTER — Telehealth: Payer: Self-pay | Admitting: Family Medicine

## 2017-09-16 MED ORDER — OXYCODONE HCL 5 MG PO TABS: ORAL_TABLET | ORAL | 0 refills | Status: DC

## 2017-09-16 NOTE — Telephone Encounter (Signed)
FYI

## 2017-09-16 NOTE — Telephone Encounter (Signed)
Copied from [place]. Topic: General - Other >> Sep 16, 2017  8:57 AM Darl Householder, RMA wrote: Reason for CRM: patient is requesting a call back from Dr. Lorelei Pont or CMA concerning a prescription for pain meds

## 2017-09-16 NOTE — Telephone Encounter (Signed)
Called her back- she is doing ok, is back home for a bit. They think she will get a liver soon She was asked by [facility] to contact me about her pain control;mostly from her leg which has the wound vac right now. She is using oxycodone 5 mg, 1 at bedtime and 2 prior to wound vac changes.  I am glad to refill this for her, she plans to see me after Christmas for a recheck Meds ordered this encounter  Medications  . oxyCODONE (OXY IR/ROXICODONE) 5 MG immediate release tablet    Sig: Take 1 at bedtime as needed for pain.  Take 1-2 prior to wound vac change    Dispense:  60 tablet    Refill:  0

## 2017-09-29 ENCOUNTER — Other Ambulatory Visit: Payer: Self-pay

## 2017-09-29 MED ORDER — RANITIDINE HCL 300 MG PO TABS: 300.0000 mg | ORAL_TABLET | Freq: Every day | ORAL | 0 refills | Status: DC

## 2017-10-10 IMAGING — CT CT ABD-PELV W/ CM
2 of 5 series · 16 of 46 positions shown, 18 images · IV contrast (APPLIED)
Comparison: CT chest 09/18/2015

ADDENDUM:
Tiny hypodensities towards the dome of liver have a benign
appearance.
CLINICAL DATA: Abdominal pain today

EXAM:
CT ABDOMEN AND PELVIS WITH CONTRAST
TECHNIQUE: Multidetector CT imaging of the abdomen and pelvis was performed
using the standard protocol following bolus administration of
intravenous contrast.
CONTRAST:  80mL WE7PE4-222 IOPAMIDOL ([id number]) INJECTION 61%

[Series 2: axial st · axial · 0.95mm/px · z∈[-523,-113]mm · 13 of 92 slices shown, 15 images]
[im 5/92  soft-tissue]
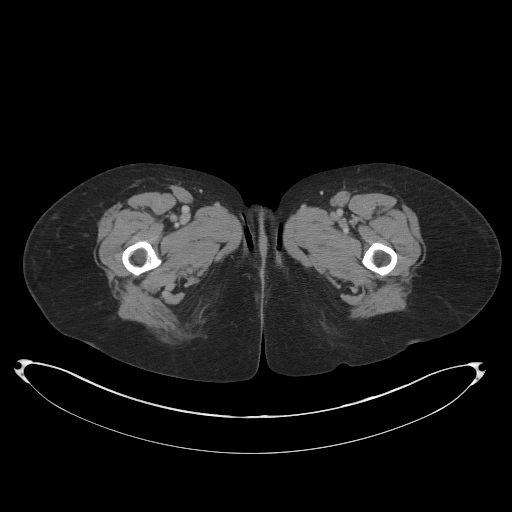
[im 5/92  bone]
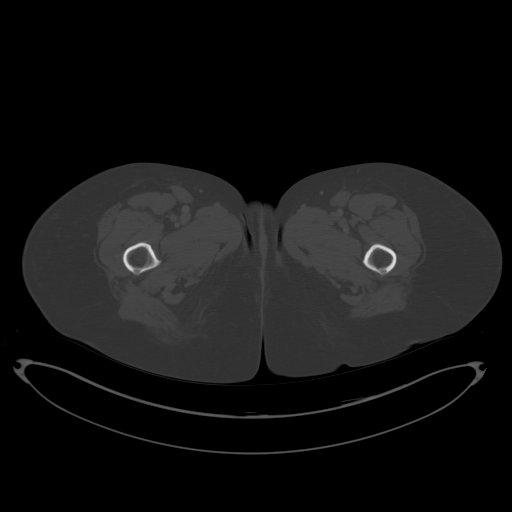
[im 15/92  soft-tissue]
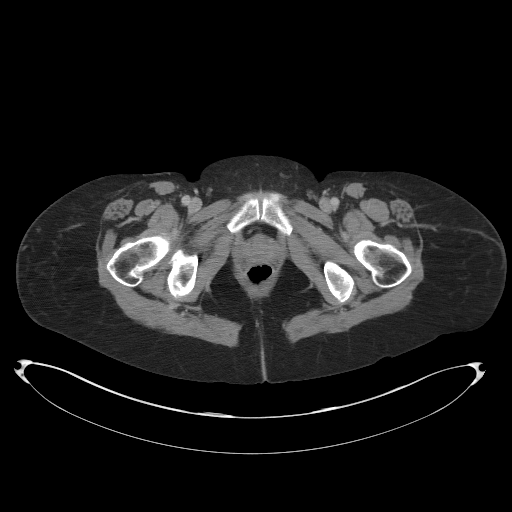
[im 20/92  soft-tissue]
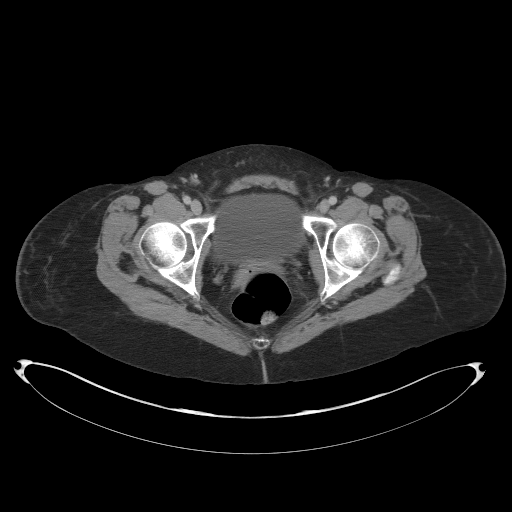
[im 24/92  soft-tissue]
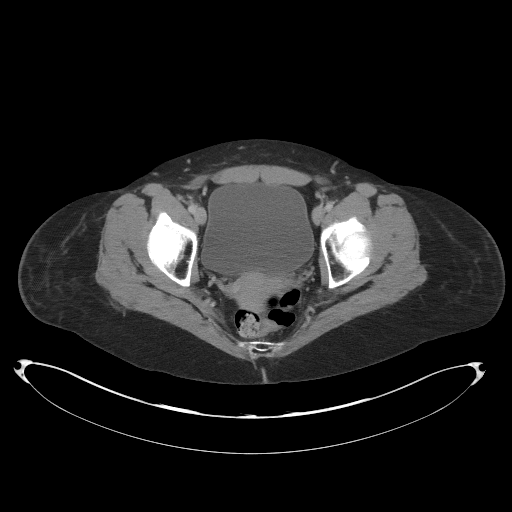
[im 34/92  soft-tissue]
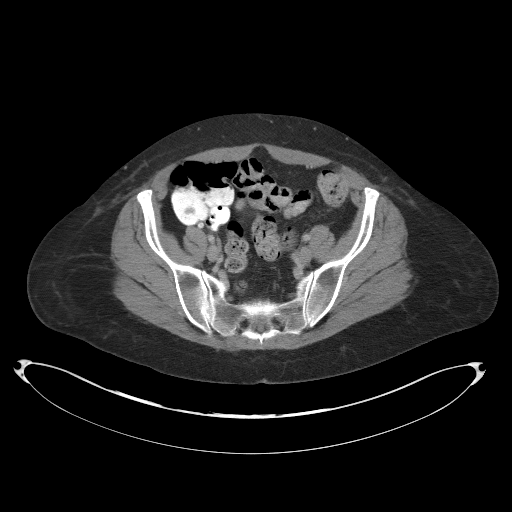
[im 39/92  soft-tissue]
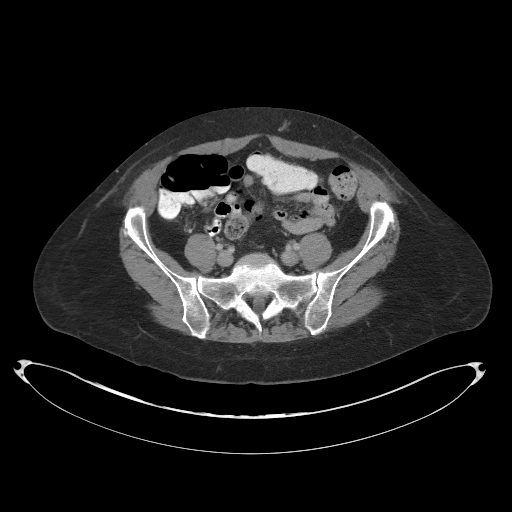
[im 48/92  soft-tissue]
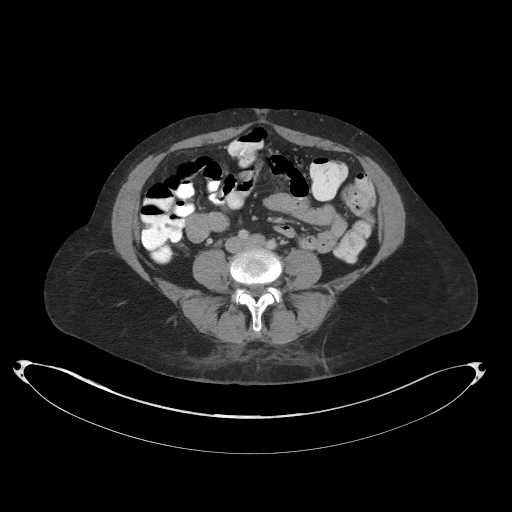
[im 53/92  soft-tissue]
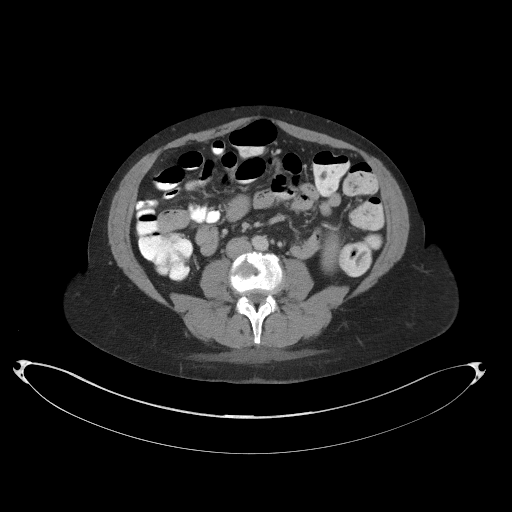
[im 58/92  soft-tissue]
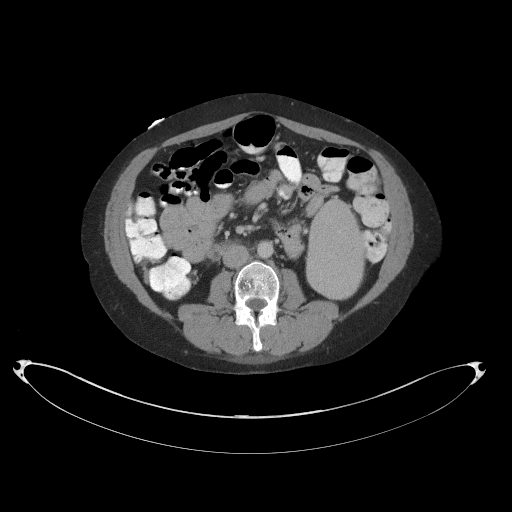
[im 58/92  bone]
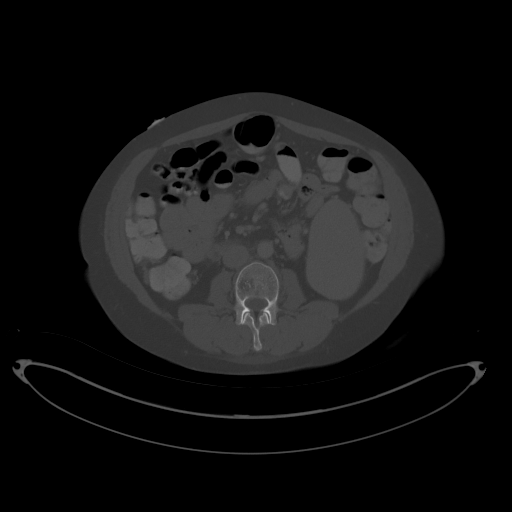
[im 68/92  soft-tissue]
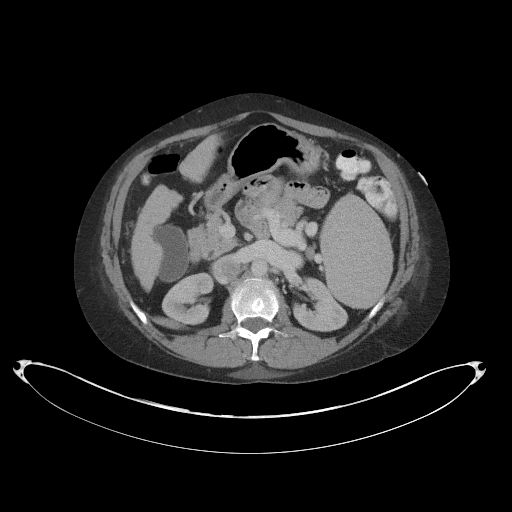
[im 72/92  soft-tissue]
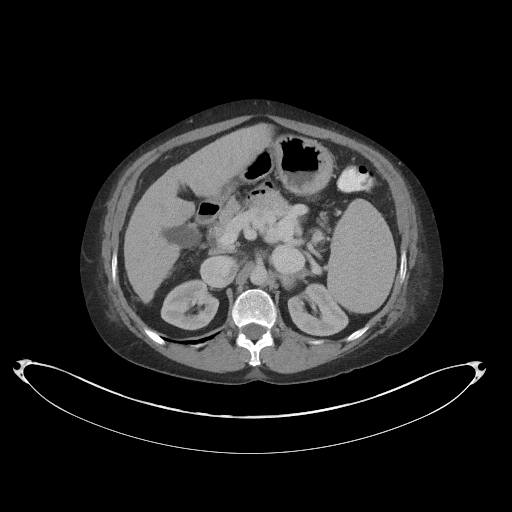
[im 77/92  soft-tissue]
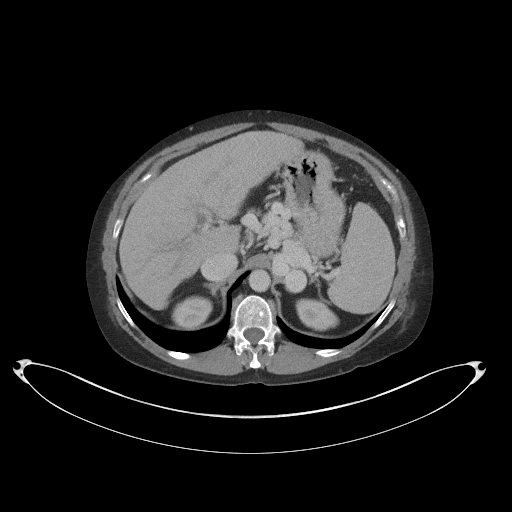
[im 87/92  soft-tissue]
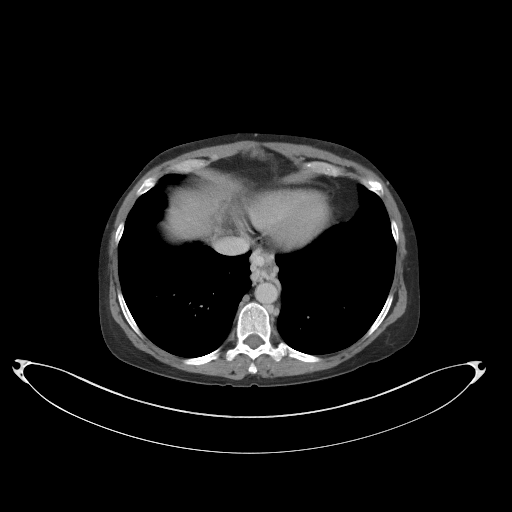

[Series 5: coronal st · coronal · 0.87mm/px · 3 of 90 slices shown]
[im 30/90  soft-tissue]
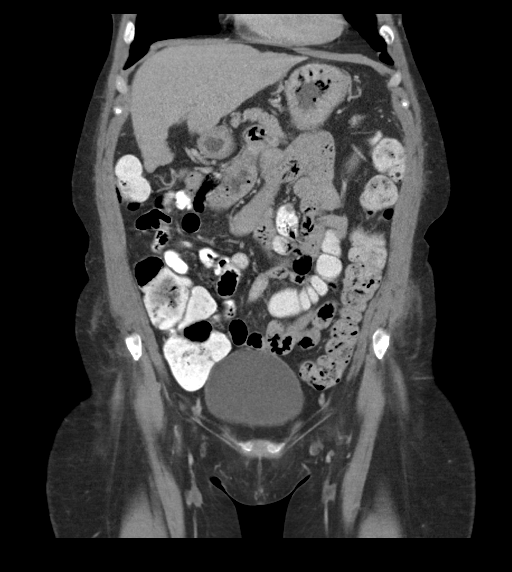
[im 40/90  soft-tissue]
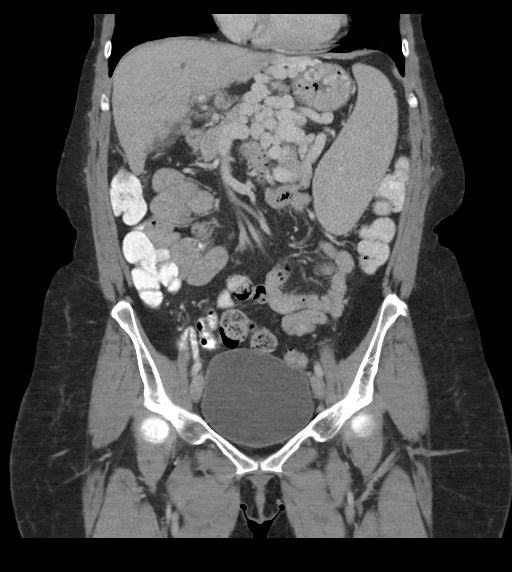
[im 50/90  soft-tissue]
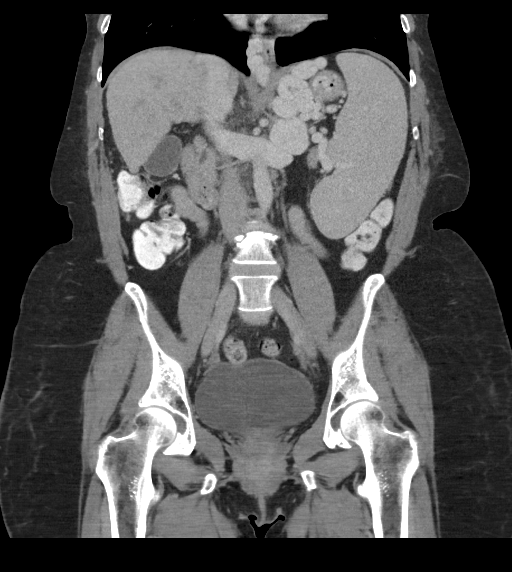

[16 of 46 positions shown; findings below may reference images not displayed]

FINDINGS: Lung bases are clear

Contour of the liver is nodular. The left lobe is prominent. Tiny
hypodensities towards the dome of the liver are nonspecific. They
measure 5 mm or less in size. The portal vein is severely
diminutive. This may be result of chronic occlusion and some re-
cannulization. There are massive gastric and gastroesophageal
varices. A spontaneous splenorenal shunt is in place and is dilated.
The left renal vein is dilated.

The spleen is enlarged measuring 14.8 cm in vertical dimension.

Pancreas is unremarkable.

Adrenal glands are within normal limits

Kidneys are within normal limits.

The colon is distended with air-fluid levels. Moderate stool burden
throughout the descending and sigmoid colon. No obvious focal mass.
There are nonspecific air-fluid levels within small bowel loops
which are nondistended.

The bladder is moderately distended. Uterus and adnexa are within
normal limits

No free fluid.  No abnormal retroperitoneal adenopathy.

Along the lower pericolic gutter, there are irregular small soft
tissue linear areas. There is no evidence of omental caking. This
finding is of unknown significance.

Superior L4 endplate Schmorl's node is associated with a compression
deformity. This involves the right side of the superior endplate. L3
[REDACTED] angioma.
IMPRESSION: Cirrhotic liver associated with massive gastric and gastroesophageal
varices

Splenomegaly

There is distention of both small and large bowel. There is moderate
stool burden of the distal colon. No obvious mass is present to
suggest obstruction.

Bladder is distended.

There is soft tissue stranding along the right pericolic gutter of
unknown significance. These areas may represent small varices. There
is no other finding to suggest omental caking or peritoneal
carcinomatosis.

L4 superior endplate fracture of indeterminate age.

## 2017-10-13 ENCOUNTER — Ambulatory Visit: Payer: BLUE CROSS/BLUE SHIELD | Admitting: Physical Therapy

## 2017-10-18 ENCOUNTER — Encounter: Payer: Self-pay | Admitting: Family Medicine

## 2017-10-20 ENCOUNTER — Other Ambulatory Visit (INDEPENDENT_AMBULATORY_CARE_PROVIDER_SITE_OTHER): Payer: BLUE CROSS/BLUE SHIELD

## 2017-10-20 ENCOUNTER — Other Ambulatory Visit: Payer: Self-pay | Admitting: Family Medicine

## 2017-10-20 DIAGNOSIS — K7290 Hepatic failure, unspecified without coma: Secondary | ICD-10-CM | POA: Diagnosis not present

## 2017-10-20 DIAGNOSIS — K7460 Unspecified cirrhosis of liver: Secondary | ICD-10-CM

## 2017-10-20 LAB — COMPREHENSIVE METABOLIC PANEL
ALBUMIN: 3 g/dL — AB (ref 3.5–5.2)
ALT: 60 U/L — ABNORMAL HIGH (ref 0–35)
AST: 56 U/L — AB (ref 0–37)
Alkaline Phosphatase: 215 U/L — ABNORMAL HIGH (ref 39–117)
BILIRUBIN TOTAL: 1.3 mg/dL — AB (ref 0.2–1.2)
BUN: 18 mg/dL (ref 6–23)
CALCIUM: 8.5 mg/dL (ref 8.4–10.5)
CO2: 28 meq/L (ref 19–32)
Chloride: 99 mEq/L (ref 96–112)
Creatinine, Ser: 1.06 mg/dL (ref 0.40–1.20)
GFR: 56.41 mL/min — ABNORMAL LOW (ref >60.00)
Glucose, Bld: 196 mg/dL — ABNORMAL HIGH (ref 70–99)
Potassium: 4.1 mEq/L (ref 3.5–5.1)
Sodium: 135 mEq/L (ref 135–145)
Total Protein: 5.5 g/dL — ABNORMAL LOW (ref 6.0–8.3)

## 2017-10-20 LAB — CBC
HCT: 26.9 % — ABNORMAL LOW (ref 36.0–46.0)
Hemoglobin: 8.7 g/dL — ABNORMAL LOW (ref 12.0–15.0)
MCHC: 32.5 g/dL (ref 30.0–36.0)
MCV: 91.2 fl (ref 78.0–100.0)
Platelets: 69 10*3/uL — ABNORMAL LOW (ref 150.0–400.0)
RBC: 2.95 Mil/uL — AB (ref 3.87–5.11)
RDW: 19.9 % — AB (ref 11.5–15.5)
WBC: 4.6 10*3/uL (ref 4.0–10.5)

## 2017-10-20 LAB — PROTIME-INR
INR: 1.3 ratio — AB (ref 0.8–1.0)
Prothrombin Time: 14.3 s — ABNORMAL HIGH (ref 9.6–13.1)

## 2017-10-20 NOTE — Telephone Encounter (Unsigned)
Copied from [place] [id number]. Topic: Quick Communication - See Telephone Encounter >> Oct 20, 2017  9:24 AM Hewitt Shorts wrote: CRM for notification. See Telephone encounter for:  Pt is looking for orders from [facility] that dr Janett Billow copland is going to draw for her -nothing is in system  Best number [phone] 10/20/17.

## 2017-10-24 ENCOUNTER — Other Ambulatory Visit (INDEPENDENT_AMBULATORY_CARE_PROVIDER_SITE_OTHER): Payer: BLUE CROSS/BLUE SHIELD

## 2017-10-24 ENCOUNTER — Encounter: Payer: Self-pay | Admitting: Family Medicine

## 2017-10-24 DIAGNOSIS — K7290 Hepatic failure, unspecified without coma: Secondary | ICD-10-CM | POA: Diagnosis not present

## 2017-10-24 LAB — CBC
HEMATOCRIT: 29.6 % — AB (ref 36.0–46.0)
Hemoglobin: 9.9 g/dL — ABNORMAL LOW (ref 12.0–15.0)
MCHC: 33.4 g/dL (ref 30.0–36.0)
MCV: 90.1 fl (ref 78.0–100.0)
Platelets: 98 10*3/uL — ABNORMAL LOW (ref 150.0–400.0)
RBC: 3.29 Mil/uL — AB (ref 3.87–5.11)
RDW: 19 % — AB (ref 11.5–15.5)
WBC: 4.8 10*3/uL (ref 4.0–10.5)

## 2017-10-24 LAB — COMPREHENSIVE METABOLIC PANEL
ALBUMIN: 3.3 g/dL — AB (ref 3.5–5.2)
ALT: 66 U/L — AB (ref 0–35)
AST: 41 U/L — AB (ref 0–37)
Alkaline Phosphatase: 228 U/L — ABNORMAL HIGH (ref 39–117)
BUN: 26 mg/dL — ABNORMAL HIGH (ref 6–23)
CALCIUM: 8.7 mg/dL (ref 8.4–10.5)
CO2: 27 meq/L (ref 19–32)
CREATININE: 1.11 mg/dL (ref 0.40–1.20)
Chloride: 99 mEq/L (ref 96–112)
GFR: 53.48 mL/min — ABNORMAL LOW (ref >60.00)
Glucose, Bld: 143 mg/dL — ABNORMAL HIGH (ref 70–99)
Potassium: 4.2 mEq/L (ref 3.5–5.1)
Sodium: 135 mEq/L (ref 135–145)
Total Bilirubin: 1.7 mg/dL — ABNORMAL HIGH (ref 0.2–1.2)
Total Protein: 6.5 g/dL (ref 6.0–8.3)

## 2017-10-24 LAB — PROTIME-INR
INR: 1.5 ratio — AB (ref 0.8–1.0)
Prothrombin Time: 16 s — ABNORMAL HIGH (ref 9.6–13.1)

## 2017-10-30 ENCOUNTER — Other Ambulatory Visit (INDEPENDENT_AMBULATORY_CARE_PROVIDER_SITE_OTHER): Payer: BLUE CROSS/BLUE SHIELD

## 2017-10-30 DIAGNOSIS — K7290 Hepatic failure, unspecified without coma: Secondary | ICD-10-CM

## 2017-10-30 LAB — COMPREHENSIVE METABOLIC PANEL
ALT: 67 U/L — ABNORMAL HIGH (ref 0–35)
AST: 39 U/L — ABNORMAL HIGH (ref 0–37)
Albumin: 3.2 g/dL — ABNORMAL LOW (ref 3.5–5.2)
Alkaline Phosphatase: 196 U/L — ABNORMAL HIGH (ref 39–117)
BUN: 26 mg/dL — ABNORMAL HIGH (ref 6–23)
CHLORIDE: 99 meq/L (ref 96–112)
CO2: 29 mEq/L (ref 19–32)
Calcium: 8.7 mg/dL (ref 8.4–10.5)
Creatinine, Ser: 1.13 mg/dL (ref 0.40–1.20)
GFR: 52.39 mL/min — AB (ref >60.00)
GLUCOSE: 176 mg/dL — AB (ref 70–99)
POTASSIUM: 4.6 meq/L (ref 3.5–5.1)
Sodium: 133 mEq/L — ABNORMAL LOW (ref 135–145)
Total Bilirubin: 1.5 mg/dL — ABNORMAL HIGH (ref 0.2–1.2)
Total Protein: 6.2 g/dL (ref 6.0–8.3)

## 2017-10-30 LAB — CBC
HEMATOCRIT: 29.4 % — AB (ref 36.0–46.0)
Hemoglobin: 9.7 g/dL — ABNORMAL LOW (ref 12.0–15.0)
MCHC: 33.2 g/dL (ref 30.0–36.0)
MCV: 89.3 fl (ref 78.0–100.0)
Platelets: 121 10*3/uL — ABNORMAL LOW (ref 150.0–400.0)
RBC: 3.29 Mil/uL — ABNORMAL LOW (ref 3.87–5.11)
RDW: 17.8 % — AB (ref 11.5–15.5)
WBC: 6 10*3/uL (ref 4.0–10.5)

## 2017-10-30 LAB — PROTIME-INR
INR: 1.4 ratio — ABNORMAL HIGH (ref 0.8–1.0)
Prothrombin Time: 15 s — ABNORMAL HIGH (ref 9.6–13.1)

## 2017-10-31 ENCOUNTER — Encounter: Payer: Self-pay | Admitting: Family Medicine

## 2017-12-09 ENCOUNTER — Emergency Department (HOSPITAL_COMMUNITY)
Admission: EM | Admit: 2017-12-09 | Discharge: 2017-12-10 | Disposition: A | Discharge status: Home / Self Care | Payer: BLUE CROSS/BLUE SHIELD | Attending: Emergency Medicine | Admitting: Emergency Medicine

## 2017-12-09 ENCOUNTER — Emergency Department (HOSPITAL_COMMUNITY): Payer: BLUE CROSS/BLUE SHIELD

## 2017-12-09 ENCOUNTER — Encounter (HOSPITAL_COMMUNITY): Payer: Self-pay | Admitting: Emergency Medicine

## 2017-12-09 DIAGNOSIS — M79604 Pain in right leg: Secondary | ICD-10-CM

## 2017-12-09 DIAGNOSIS — Z944 Liver transplant status: Secondary | ICD-10-CM | POA: Insufficient documentation

## 2017-12-09 DIAGNOSIS — Y92009 Unspecified place in unspecified non-institutional (private) residence as the place of occurrence of the external cause: Secondary | ICD-10-CM | POA: Insufficient documentation

## 2017-12-09 DIAGNOSIS — Z79899 Other long term (current) drug therapy: Secondary | ICD-10-CM | POA: Insufficient documentation

## 2017-12-09 DIAGNOSIS — M79661 Pain in right lower leg: Secondary | ICD-10-CM | POA: Diagnosis not present

## 2017-12-09 DIAGNOSIS — Y999 Unspecified external cause status: Secondary | ICD-10-CM | POA: Insufficient documentation

## 2017-12-09 DIAGNOSIS — Z7901 Long term (current) use of anticoagulants: Secondary | ICD-10-CM | POA: Insufficient documentation

## 2017-12-09 DIAGNOSIS — M79662 Pain in left lower leg: Secondary | ICD-10-CM | POA: Insufficient documentation

## 2017-12-09 DIAGNOSIS — S81811A Laceration without foreign body, right lower leg, initial encounter: Secondary | ICD-10-CM | POA: Insufficient documentation

## 2017-12-09 DIAGNOSIS — Y939 Activity, unspecified: Secondary | ICD-10-CM | POA: Diagnosis not present

## 2017-12-09 DIAGNOSIS — S41112A Laceration without foreign body of left upper arm, initial encounter: Secondary | ICD-10-CM | POA: Diagnosis not present

## 2017-12-09 DIAGNOSIS — M79605 Pain in left leg: Secondary | ICD-10-CM

## 2017-12-09 DIAGNOSIS — T07XXXA Unspecified multiple injuries, initial encounter: Secondary | ICD-10-CM | POA: Diagnosis present

## 2017-12-09 DIAGNOSIS — W19XXXA Unspecified fall, initial encounter: Secondary | ICD-10-CM

## 2017-12-09 DIAGNOSIS — Z9889 Other specified postprocedural states: Secondary | ICD-10-CM | POA: Diagnosis not present

## 2017-12-09 DIAGNOSIS — W1830XA Fall on same level, unspecified, initial encounter: Secondary | ICD-10-CM | POA: Diagnosis not present

## 2017-12-09 MED ORDER — LIDOCAINE-EPINEPHRINE-TETRACAINE (LET) SOLUTION
3.0000 mL | Freq: Once | NASAL | Status: AC
  Administered 2017-12-10: 3 mL via TOPICAL
  Filled 2017-12-09: qty 3

## 2017-12-09 NOTE — ED Triage Notes (Signed)
Pt released fro [facility] after recent liver tranplant this evening has slip and fall causing pain and hematoma to right lower extremity. Recently on blood thinner lovenox

## 2017-12-10 ENCOUNTER — Encounter: Payer: Self-pay | Admitting: Family Medicine

## 2017-12-10 MED ORDER — LIDOCAINE-EPINEPHRINE (PF) 2 %-1:200000 IJ SOLN
10.0000 mL | Freq: Once | INTRAMUSCULAR | Status: AC
  Administered 2017-12-10: 10 mL
  Filled 2017-12-10: qty 20

## 2017-12-10 NOTE — ED Provider Notes (Signed)
[place] DEPT Provider Note   CSN: [id number] Arrival date & time: 12/09/17  2236     History   Chief Complaint Chief Complaint  Patient presents with  . Fall  . Leg Pain    HPI Alicia [name] is a 57 y.o. female.  HPI 57 year old Caucasian female past medical history significant for cirrhosis with recent liver transplant, asthma, GERD presents to the emergency department today after mechanical fall.  The patient states that she was released from [facility] earlier this evening after 2-week stay for liver transplant.  Patient states that she has been on Lovenox however this was discontinued yesterday morning.  Patient states that when she got home today she felt like she was deconditioned.  She went to take a step up to the living room and her son and husband did not have a good handle with her.  States that she tripped falling and scraping the right side of her leg on the corner of the wall.  She also reports having a small abrasion to the left arm.  Patient denies hitting her head or LOC.  She denies any associated headache, vision changes, lightheadedness, dizziness, neck pain, chest pain, abdominal pain, back pain, paresthesias, urinary symptoms.  Patient has not taken anything for the pain.  She does have a history of significant hematomas given her poor liver function and being on blood thinners.  Patient reports a hematoma to the right calf that is tender to palpation.  Patient states that it is difficult to walk due to the pain of the right calf.  She denies any pain to the right hip or right knee.  Patient denies any associated paresthesias or weakness.  Tetanus shot is up-to-date. Past Medical History:  Diagnosis Date  . Allergy   . Anemia   . Asthma   . Cellulitis   . Cirrhosis ([place])   . GERD (gastroesophageal reflux disease)   . Hepatic fibrosis    congenital  . History of UTI    chronic as a child  . Sarcoidosis   . Spleen enlarged      Patient Active Problem List   Diagnosis Date Noted  . Syncope 07/17/2017  . Other cirrhosis of liver ([place]) 05/10/2017  . Immunocompromised patient ([place]) 05/10/2017  . Mucous cyst of digit of left hand 04/11/2017  . Cellulitis, abdominal wall 03/01/2017  . Normocytic anemia 03/01/2017  . Hypokalemia 03/01/2017  . CKD (chronic kidney disease), stage III ([place]) 03/01/2017  . Abdominal wall cellulitis 03/01/2017  . Adrenal insufficiency ([place]) 03/01/2017  . Hyperammonemia ([place])   . Cellulitis 06/28/2016  . Thrombocytopenia ([place]) 06/28/2016  . AKI (acute kidney injury) ([place]) 06/28/2016  . Multiple pulmonary nodules determined by computed tomography of lung   . Chronic cough 09/07/2015  . Hypertension, portal ([place]) 09/04/2011  . Asthma with chronic obstructive pulmonary disease (COPD) ([place]) 09/04/2011  . 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 GERD (gastroesophageal reflux disease) 07/04/2011  . Sarcoidosis 07/01/2011  . Asthma, severe persistent, with upper airway instability 06/13/2011  . 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 COLONOSCOPY    . CYST EXCISION Left 05/01/2017   Procedure: LEFT INDEX FINGER CYST EXCISION;  Surgeon: Leandrew Koyanagi, MD;  Location: [facility];  Service: Orthopedics;  Laterality: Left;  . ESOPHAGOGASTRODUODENOSCOPY    . SPINAL FUSION  12/2007   Double spinal Fusion  . VIDEO BRONCHOSCOPY Bilateral 12/08/2015   Procedure: VIDEO BRONCHOSCOPY WITH  FLUORO;  Surgeon: Rigoberto Noel, MD;  Location: [place];  Service: Cardiopulmonary;  Laterality: Bilateral;    OB History    No data available       Home Medications    Prior to Admission medications   Medication Sig Start Date End Date Taking? Authorizing Provider  albuterol (PROVENTIL HFA;VENTOLIN HFA) 108 (90 Base) MCG/ACT inhaler Inhale 1-2 puffs into the lungs every 6 (six) hours as needed for wheezing or shortness of breath. 10/19/16   Palumbo, April, MD    benzonatate (TESSALON) 100 MG capsule Take 100 mg by mouth 3 (three) times daily as needed for cough.  01/30/17   [provider]  doxycycline (VIBRAMYCIN) 100 MG capsule Take 1 capsule (100 mg total) by mouth 2 (two) times daily. 08/28/17   Copland, Gay Filler, MD  DULERA 200-5 MCG/ACT AERO INHALE 2 PUFFS BY MOUTH TWO TIMES DAILY. 03/17/17   Rigoberto Noel, MD  furosemide (LASIX) 40 MG tablet Take 1 tablet (40 mg total) by mouth 2 (two) times daily. Patient taking differently: Take 120 mg by mouth 2 (two) times daily.  05/15/17   Regalado, Belkys A, MD  ipratropium-albuterol (DUONEB) 0.5-2.5 (3) MG/3ML SOLN Take 3 mLs by nebulization every 4 (four) hours as needed. 06/30/17   Copland, Gay Filler, MD  KLOR-CON M20 20 MEQ tablet TAKE 3 DOSES OF 40 MEQ (2 PILLS) OVER THE NEXT 36 HOURS. THEN RECHECK POTASSIUM LEVEL 07/02/17   Copland, Gay Filler, MD  lactulose (CHRONULAC) 10 GM/15ML solution Take 30 g by mouth 2 (two) times daily.  06/27/16   [provider]  magic mouthwash SOLN Take 5 mLs by mouth 3 (three) times daily. 05/15/17   Regalado, Belkys A, MD  oxyCODONE (OXY IR/ROXICODONE) 5 MG immediate release tablet Take 1 at bedtime as needed for pain.  Take 1-2 prior to wound vac change 09/16/17   Copland, Gay Filler, MD  potassium chloride (K-DUR) 10 MEQ tablet Take 2 tablets (20 mEq total) by mouth daily. 05/15/17   Regalado, Belkys A, MD  predniSONE (DELTASONE) 1 MG tablet Take 1 mg by mouth every other day.  01/27/17   [provider]  ranitidine (ZANTAC) 300 MG tablet Take 1 tablet (300 mg total) by mouth at bedtime. 09/29/17   Rigoberto Noel, MD  saccharomyces boulardii (FLORASTOR) 250 MG capsule Take 1 capsule (250 mg total) by mouth 2 (two) times daily. 05/15/17   Regalado, Jerald Kief A, MD  SPIRIVA RESPIMAT 2.5 MCG/ACT AERS Inhale 2 puffs into the lungs daily at 2 PM.  01/27/17   [provider]  spironolactone (ALDACTONE) 100 MG tablet Take 300 mg by mouth daily.      [provider]    Family History Family History  Problem Relation Age of Onset  . Arthritis Mother   . Heart disease Father        Triple bypass  . Migraines Father   . Heart attack Paternal Grandmother   . Stroke Paternal Grandfather   . Liver disease Sister        Juvenile cysitc liver disease  . Asthma Sister     Social History Social History   Tobacco Use  . Smoking status: Never Smoker  . Smokeless tobacco: Never Used  Substance Use Topics  . Alcohol use: No  . Drug use: No     Allergies   Aspirin; Peanut-containing drug products; Gabapentin; and Azithromycin   Review of Systems Review of Systems  All other systems reviewed and  are negative.    Physical Exam Updated Vital Signs BP (!) 149/89   Pulse 94   Temp 97.8 F (36.6 C) (Oral)   Resp 20   SpO2 96%   Physical Exam  Constitutional: She is oriented to person, place, and time. She appears well-developed and well-nourished. No distress.  HENT:  Head: Normocephalic and atraumatic.  No bilateral hemotympanum.  No septal hematoma.  No skull depression.  No ecchymosis or hematoma noted at the head.  Eyes: Conjunctivae are normal. Right eye exhibits no discharge. Left eye exhibits no discharge. No scleral icterus.  Neck: Normal range of motion. Neck supple.  No c spine midline tenderness. No paraspinal tenderness. No deformities or step offs noted. Full ROM. Supple. No nuchal rigidity.    Cardiovascular: Intact distal pulses.  Pulmonary/Chest: No respiratory distress.  Abdominal: Soft. Bowel sounds are normal. She exhibits no distension and no mass. There is no tenderness. There is no rebound and no guarding.  No ecchymosis noted to the abdomen.  Incision from liver surgery in place with staples without any signs of purulent drainage or erythema.  No bleeding noted.  Not tender to palpation.  Musculoskeletal: Normal range of motion.  No midline T spine or L spine tenderness. No deformities or  step offs noted. Full ROM. Pelvis is stable.  Full range motion of all joints of the lower extremities.  Skin departments are soft.  Radial pulses along with DP pulses are 2+ bilaterally.  Sensation intact.  Brisk cap refill.  Neurological: She is alert and oriented to person, place, and time.  Skin: Skin is warm and dry. Capillary refill takes less than 2 seconds. No pallor.  Patient has approximately 3-1/2 cm laceration to the right lateral calf just distal to the right knee.  Adipose tissue showing.  No tendon involvement.  Patient has good range of motion of the right knee and right ankle along with the right hip.  Skin compartments are soft.  There is a hematoma noted to the right calf but the hematoma is soft and not hard in nature.  Controlled.  Patient also has a small skin tear to the left forearm with bleeding controlled.  No open wound noted.  Psychiatric: Her behavior is normal. Judgment and thought content normal.  Nursing note and vitals reviewed.    ED Treatments / Results  Labs (all labs ordered are listed, but only abnormal results are displayed) Labs Reviewed - No data to display  EKG  EKG Interpretation None       Radiology Dg Pelvis 1-2 Views  Result Date: 12/10/2017 CLINICAL DATA:  Initial evaluation for acute trauma, fall. EXAM: PELVIS - 1-2 VIEW COMPARISON:  None. FINDINGS: No acute fracture dislocation. Femoral heads in normal alignment within the acetabula. Femoral head heights preserved. Bony pelvis intact. SI joints approximated. No acute soft tissue abnormality. IMPRESSION: No acute osseous abnormality about the pelvis. Electronically Signed   By: Jeannine Boga M.D.   On: 12/10/2017 00:07   Dg Tibia/fibula Left  Result Date: 12/10/2017 CLINICAL DATA:  Initial evaluation for acute trauma, fall. EXAM: LEFT TIBIA AND FIBULA - 2 VIEW COMPARISON:  None. No acute fracture or dislocation. No acute soft tissue abnormality. Small cortical lucency within the  proximal left tibial shaft favored to be vascular in nature. IMPRESSION: No acute osseous abnormality about the left tibia/fibula. Electronically Signed   By: Jeannine Boga M.D.   On: 12/10/2017 00:12   Dg Tibia/fibula Right  Result Date: 12/10/2017 CLINICAL DATA:  Initial evaluation for acute trauma, fall. EXAM: RIGHT TIBIA AND FIBULA - 2 VIEW COMPARISON:  None. FINDINGS: No acute fracture dislocation. No discrete osseous lesions. There is question of focal soft tissue swelling at the medial aspect of the proximal-mid right leg. No other acute soft tissue abnormality. IMPRESSION: 1. No acute osseous abnormality about the right tibia/fibula. 2. Focal soft tissue swelling at the medial aspect of the proximal-mid right leg. Electronically Signed   By: Jeannine Boga M.D.   On: 12/10/2017 00:15   Dg Femur Min 2 Views Left  Result Date: 12/10/2017 CLINICAL DATA:  Initial evaluation for acute trauma, fall. EXAM: LEFT FEMUR 2 VIEWS COMPARISON:  None. FINDINGS: No acute fracture or dislocation. Limited views of the knee and hip demonstrate no acute abnormality. No discrete osseous lesions. No acute soft tissue abnormality. IMPRESSION: No acute osseous abnormality about the left femur. Electronically Signed   By: Jeannine Boga M.D.   On: 12/10/2017 00:09   Dg Femur Min 2 Views Right  Result Date: 12/10/2017 CLINICAL DATA:  Initial evaluation for acute trauma, fall. EXAM: RIGHT FEMUR 2 VIEWS COMPARISON:  None. FINDINGS: No acute fracture or dislocation. Limited views of the hip and knee are grossly unremarkable. No discrete osseous lesions. No acute soft tissue abnormality. IMPRESSION: No acute osseous abnormality about the right femur. Electronically Signed   By: Jeannine Boga M.D.   On: 12/10/2017 00:10    Procedures .Marland KitchenLaceration Repair Date/Time: 12/10/2017 1:27 AM Performed by: Doristine Devoid, PA-C Authorized by: Doristine Devoid, PA-C   Consent:    Consent  obtained:  Verbal   Consent given by:  Patient   Risks discussed:  Infection, need for additional repair, nerve damage, poor wound healing, poor cosmetic result, pain, retained foreign body, tendon damage and vascular damage   Alternatives discussed:  No treatment Anesthesia (see MAR for exact dosages):    Anesthesia method:  Topical application and local infiltration   Topical anesthetic:  LET   Local anesthetic:  Lidocaine 1% WITH epi Laceration details:    Location:  Leg   Leg location:  R lower leg   Length (cm):  3.5 Repair type:    Repair type:  Simple Pre-procedure details:    Preparation:  Patient was prepped and draped in usual sterile fashion and imaging obtained to evaluate for foreign bodies Exploration:    Hemostasis achieved with:  Direct pressure   Wound extent: areolar tissue violated     Wound extent: no foreign bodies/material noted and no underlying fracture noted     Contaminated: no   Treatment:    Area cleansed with:  Betadine and saline   Amount of cleaning:  Standard   Irrigation solution:  Sterile saline   Irrigation method:  Pressure wash   Visualized foreign bodies/material removed: no   Skin repair:    Repair method:  Staples   Number of staples:  9 Approximation:    Approximation:  Close   Vermilion border: well-aligned   Post-procedure details:    Dressing:  Antibiotic ointment and bulky dressing   Patient tolerance of procedure:  Tolerated well, no immediate complications   (including critical care time)  Medications Ordered in ED Medications  lidocaine-EPINEPHrine-tetracaine (LET) solution (3 mLs Topical Given 12/10/17 0011)  lidocaine-EPINEPHrine (XYLOCAINE W/EPI) 2 %-1:200000 (PF) injection 10 mL (10 mLs Infiltration Given 12/10/17 0101)     Initial Impression / Assessment and Plan / ED Course  I have reviewed the triage vital signs and the nursing  notes.  Pertinent labs & imaging results that were available during my care of the  patient were reviewed by me and considered in my medical decision making (see chart for details).     Patient presents to the emergency department today for evaluation of mechanical fall with laceration to the right leg along with hematoma to the right calf and skin abrasion to the left forearm.  Patient recently discharged from the hospital after 2-week stay for liver transplant today.  Patient has been off of her Lovenox for 36 hours.  Patient's tetanus shot is up-to-date.  Imaging was obtained that showed no bony involvement or signs of fracture.  Patient is neurovascularly intact.  Skin compartments are soft.  Laceration was cleaned and irrigated and repaired with staples to allow for drainage of the area.  Skin tear on the left forearm that required no sutures but very strips were applied.  Patient had no signs of intracranial, intrathoracic, intra-abdominal trauma.  Her abdomen is nontender to palpation without any ecchymosis.  No hematoma of the head noted.  Patient denies any head injury, LOC, abdominal pain, nausea or emesis.  Patient has been ambulatory since the event.  Has follow-up appointment with the transplant team in 2 days.  Discussed return precautions.  Patient was also seen and evaluated by attending who is agreed with the above plan.  Patient is on chronic antibiotics but no indication for further antibiotics indicated at this time.  Pt is hemodynamically stable, in NAD, & able to ambulate in the ED. Evaluation does not show pathology that would require ongoing emergent intervention or inpatient treatment. I explained the diagnosis to the patient. Pain has been managed & has no complaints prior to dc. Pt is comfortable with above plan and is stable for discharge at this time. All questions were answered prior to disposition. Strict return precautions for f/u to the ED were discussed. Encouraged follow up with PCP.   Final Clinical Impressions(s) / ED Diagnoses   Final diagnoses:   Fall, initial encounter  Bilateral leg pain  Laceration of right lower extremity, initial encounter  Skin tear of left upper arm without complication, initial encounter    ED Discharge Orders    None       Aaron Edelman 12/10/17 0131    Palumbo, April, MD 12/10/17 0202

## 2017-12-10 NOTE — Discharge Instructions (Signed)
WOUND CARE °Please have your stitches/staples removed in 7 or sooner if you have concerns. You may do this at any available urgent care or at your primary care doctor's office. ° Keep area clean and dry for 24 hours. Do not remove °bandage, if applied. ° After 24 hours, remove bandage and wash wound °gently with mild soap and warm water. Reapply °a new bandage after cleaning wound, if directed. ° Continue daily cleansing with soap and water until °stitches/staples are removed. ° Do not apply any ointments or creams to the wound °while stitches/staples are in place, as this may cause °delayed healing. ° Seek medical careif you experience any of the following °signs of infection: Swelling, redness, pus drainage, °streaking, fever >101.0 F ° Seek care if you experience excessive bleeding °that does not stop after 15-20 minutes of constant, firm °pressure. ° ° °

## 2017-12-23 ENCOUNTER — Telehealth: Payer: Self-pay

## 2017-12-23 DIAGNOSIS — Z944 Liver transplant status: Secondary | ICD-10-CM

## 2017-12-23 NOTE — Telephone Encounter (Signed)
Received requested labs- they are requesting CBC w/ diff, CMP, Tacrolimus, and magnesium on 12/24/2017. Information placed in MD red folder.

## 2017-12-23 NOTE — Telephone Encounter (Signed)
Called her back to check on her, ordered labs for her to have drawn tomorrow celebrated with her regarding her recent liver transplant!

## 2017-12-23 NOTE — Telephone Encounter (Signed)
Have note seen lab requests as of this morning.

## 2017-12-23 NOTE — Telephone Encounter (Signed)
Copied from [place]. Topic: Appointment Scheduling - Scheduling Inquiry for Clinic >> Dec 23, 2017  9:04 AM Ether Griffins B wrote: Reason for CRM: pt was just released from [facility] from having liver transplant.  They have sent some lab request to the office and pt would like to schedule to have those done tomorrow afternoon.

## 2017-12-24 ENCOUNTER — Other Ambulatory Visit (INDEPENDENT_AMBULATORY_CARE_PROVIDER_SITE_OTHER): Payer: BLUE CROSS/BLUE SHIELD

## 2017-12-24 ENCOUNTER — Telehealth: Payer: Self-pay | Admitting: Family Medicine

## 2017-12-24 ENCOUNTER — Other Ambulatory Visit: Payer: Self-pay | Admitting: Family Medicine

## 2017-12-24 DIAGNOSIS — Z944 Liver transplant status: Secondary | ICD-10-CM

## 2017-12-24 LAB — COMPREHENSIVE METABOLIC PANEL
ALK PHOS: 67 U/L (ref 39–117)
ALT: 7 U/L (ref 0–35)
AST: 10 U/L (ref 0–37)
Albumin: 3.7 g/dL (ref 3.5–5.2)
BUN: 25 mg/dL — ABNORMAL HIGH (ref 6–23)
CO2: 32 mEq/L (ref 19–32)
Calcium: 9.2 mg/dL (ref 8.4–10.5)
Chloride: 96 mEq/L (ref 96–112)
Creatinine, Ser: 1.84 mg/dL — ABNORMAL HIGH (ref 0.40–1.20)
GFR: 29.83 mL/min — AB (ref >60.00)
GLUCOSE: 141 mg/dL — AB (ref 70–99)
Potassium: 4.5 mEq/L (ref 3.5–5.1)
Sodium: 138 mEq/L (ref 135–145)
TOTAL PROTEIN: 6.3 g/dL (ref 6.0–8.3)
Total Bilirubin: 0.6 mg/dL (ref 0.2–1.2)

## 2017-12-24 LAB — CBC WITH DIFFERENTIAL/PLATELET
Basophils Absolute: 0.1 10*3/uL (ref 0.0–0.1)
Basophils Relative: 1 % (ref 0.0–3.0)
EOS PCT: 1.5 % (ref 0.0–5.0)
Eosinophils Absolute: 0.2 10*3/uL (ref 0.0–0.7)
HEMATOCRIT: 28.8 % — AB (ref 36.0–46.0)
HEMOGLOBIN: 9.9 g/dL — AB (ref 12.0–15.0)
LYMPHS ABS: 0.5 10*3/uL — AB (ref 0.7–4.0)
MCHC: 34.3 g/dL (ref 30.0–36.0)
MCV: 88.3 fl (ref 78.0–100.0)
Monocytes Absolute: 0.9 10*3/uL (ref 0.1–1.0)
Monocytes Relative: 8 % (ref 3.0–12.0)
NEUTROS PCT: 85.1 % — AB (ref 43.0–77.0)
Neutro Abs: 9.5 10*3/uL — ABNORMAL HIGH (ref 1.4–7.7)
Platelets: 290 10*3/uL (ref 150.0–400.0)
RBC: 3.26 Mil/uL — AB (ref 3.87–5.11)
RDW: 21.1 % — ABNORMAL HIGH (ref 11.5–15.5)
WBC: 11.1 10*3/uL — ABNORMAL HIGH (ref 4.0–10.5)

## 2017-12-24 LAB — MAGNESIUM: MAGNESIUM: 1.5 mg/dL (ref 1.5–2.5)

## 2017-12-24 NOTE — Telephone Encounter (Signed)
Called her back [facility] had called regarding her labs from today- they are concerned about her wbc count being higher than on 3/24 when it was 3.7.  Then would like her to have a repeat CBC and CXR tomorrow.  This is fine- will need to get staff to [facility] but asked her to come in around 12:30 tomorrow and I will see her  Looked in care everywhere- on 3/25 creat was 2.1 so she is trending in the right direction  Red cell counts are stable to improved  Results for orders placed or performed in visit on 12/24/17  Magnesium  Result Value Ref Range   Magnesium 1.5 1.5 - 2.5 mg/dL  Comprehensive metabolic panel  Result Value Ref Range   Sodium 138 135 - 145 mEq/L   Potassium 4.5 3.5 - 5.1 mEq/L   Chloride 96 96 - 112 mEq/L   CO2 32 19 - 32 mEq/L   Glucose, Bld 141 (H) 70 - 99 mg/dL   BUN 25 (H) 6 - 23 mg/dL   Creatinine, Ser 1.84 (H) 0.40 - 1.20 mg/dL   Total Bilirubin 0.6 0.2 - 1.2 mg/dL   Alkaline Phosphatase 67 39 - 117 U/L   AST 10 0 - 37 U/L   ALT 7 0 - 35 U/L   Total Protein 6.3 6.0 - 8.3 g/dL   Albumin 3.7 3.5 - 5.2 g/dL   Calcium 9.2 8.4 - 10.5 mg/dL   GFR 29.83 (L) >60.00 mL/min  CBC with Differential/Platelet  Result Value Ref Range   WBC (H) 4.0 - 10.5 K/uL    11.1 A Manual Differential was performed and is consistent with the Automated Differential.   RBC 3.26 (L) 3.87 - 5.11 Mil/uL   Hemoglobin 9.9 (L) 12.0 - 15.0 g/dL   HCT 28.8 (L) 36.0 - 46.0 %   MCV 88.3 78.0 - 100.0 fl   MCHC 34.3 30.0 - 36.0 g/dL   RDW 21.1 (H) 11.5 - 15.5 %   Platelets 290.0 150.0 - 400.0 K/uL   Neutrophils Relative % 85.1 (H) 43.0 - 77.0 %   Lymphocytes Relative 4.4 Repeated and verified X2. (L) 12.0 - 46.0 %   Monocytes Relative 8.0 3.0 - 12.0 %   Eosinophils Relative 1.5 0.0 - 5.0 %   Basophils Relative 1.0 0.0 - 3.0 %   Neutro Abs 9.5 (H) 1.4 - 7.7 K/uL   Lymphs Abs 0.5 (L) 0.7 - 4.0 K/uL   Monocytes Absolute 0.9 0.1 - 1.0 K/uL   Eosinophils Absolute 0.2 0.0 - 0.7 K/uL   Basophils  Absolute 0.1 0.0 - 0.1 K/uL

## 2017-12-24 NOTE — Telephone Encounter (Signed)
Copied from [place] [id number]. Topic: Appointment Scheduling - Scheduling Inquiry for Clinic >> Dec 24, 2017  5:22 PM Cecelia Byars, [place] wrote: Reason for CRM: Patient called and prefers to be see  Dr Lorelei Pont on 12/25/17  if possible her white blood count is high and she is nervous and would like to be seen by Dr Lorelei Pont if at all possible during this time of transition after having a liver transplant ,please call her at  [phone] , she also stated her [place] suggested a chest x ray as well

## 2017-12-24 NOTE — Addendum Note (Signed)
Addended by: Harl Bowie on: 12/24/2017 12:53 PM   Modules accepted: Orders

## 2017-12-24 NOTE — Progress Notes (Signed)
Placed standing orders in chart for CBC, CMP, tacrolimus level and magnesium   Fax number for [facility] is [phone]

## 2017-12-25 ENCOUNTER — Encounter: Payer: Self-pay | Admitting: Family Medicine

## 2017-12-25 ENCOUNTER — Other Ambulatory Visit: Payer: Self-pay | Admitting: Family Medicine

## 2017-12-25 ENCOUNTER — Ambulatory Visit: Payer: BLUE CROSS/BLUE SHIELD | Admitting: Family Medicine

## 2017-12-25 ENCOUNTER — Ambulatory Visit (INDEPENDENT_AMBULATORY_CARE_PROVIDER_SITE_OTHER): Payer: BLUE CROSS/BLUE SHIELD | Admitting: Family Medicine

## 2017-12-25 ENCOUNTER — Ambulatory Visit (HOSPITAL_BASED_OUTPATIENT_CLINIC_OR_DEPARTMENT_OTHER)
Admission: RE | Admit: 2017-12-25 | Discharge: 2017-12-25 | Disposition: A | Discharge status: Home / Self Care | Payer: BLUE CROSS/BLUE SHIELD | Source: Ambulatory Visit | Attending: Family Medicine | Admitting: Family Medicine

## 2017-12-25 VITALS — BP 144/82 | HR 107 | Temp 98.1°F | Ht 62.0 in | Wt 144.0 lb

## 2017-12-25 DIAGNOSIS — J9811 Atelectasis: Secondary | ICD-10-CM | POA: Insufficient documentation

## 2017-12-25 DIAGNOSIS — J90 Pleural effusion, not elsewhere classified: Secondary | ICD-10-CM | POA: Insufficient documentation

## 2017-12-25 DIAGNOSIS — Y95 Nosocomial condition: Principal | ICD-10-CM

## 2017-12-25 DIAGNOSIS — D72829 Elevated white blood cell count, unspecified: Secondary | ICD-10-CM

## 2017-12-25 DIAGNOSIS — J189 Pneumonia, unspecified organism: Secondary | ICD-10-CM | POA: Diagnosis not present

## 2017-12-25 DIAGNOSIS — Z944 Liver transplant status: Secondary | ICD-10-CM

## 2017-12-25 LAB — CBC
HCT: 30 % — ABNORMAL LOW (ref 36.0–46.0)
HEMOGLOBIN: 10.3 g/dL — AB (ref 12.0–15.0)
MCHC: 34.4 g/dL (ref 30.0–36.0)
MCV: 87.7 fl (ref 78.0–100.0)
PLATELETS: 327 10*3/uL (ref 150.0–400.0)
RBC: 3.42 Mil/uL — ABNORMAL LOW (ref 3.87–5.11)
RDW: 21.4 % — ABNORMAL HIGH (ref 11.5–15.5)
WBC: 12.7 10*3/uL — AB (ref 4.0–10.5)

## 2017-12-25 LAB — MAGNESIUM: MAGNESIUM: 1.7 mg/dL (ref 1.5–2.5)

## 2017-12-25 LAB — COMPREHENSIVE METABOLIC PANEL
ALBUMIN: 3.8 g/dL (ref 3.5–5.2)
ALK PHOS: 71 U/L (ref 39–117)
ALT: 11 U/L (ref 0–35)
AST: 12 U/L (ref 0–37)
BUN: 26 mg/dL — ABNORMAL HIGH (ref 6–23)
CO2: 30 mEq/L (ref 19–32)
Calcium: 9.6 mg/dL (ref 8.4–10.5)
Chloride: 95 mEq/L — ABNORMAL LOW (ref 96–112)
Creatinine, Ser: 2.11 mg/dL — ABNORMAL HIGH (ref 0.40–1.20)
GFR: 25.47 mL/min — ABNORMAL LOW (ref >60.00)
GLUCOSE: 129 mg/dL — AB (ref 70–99)
POTASSIUM: 4.5 meq/L (ref 3.5–5.1)
Sodium: 136 mEq/L (ref 135–145)
TOTAL PROTEIN: 6.6 g/dL (ref 6.0–8.3)
Total Bilirubin: 0.6 mg/dL (ref 0.2–1.2)

## 2017-12-25 NOTE — Progress Notes (Deleted)
[place] at [facility] [address], [place], [place] [address] [phone]  Date:  12/25/2017   Name:  Alicia [name]   DOB:  1960-10-14   MRN:  [id number]  PCP:  Darreld Mclean, MD    Chief Complaint: No chief complaint on file.   History of Present Illness:  Alicia [name] is a 57 y.o. very pleasant female patient who presents with the following:  ***  Patient Active Problem List   Diagnosis Date Noted  . Syncope 07/17/2017  . Other cirrhosis of liver ([place]) 05/10/2017  . Immunocompromised patient ([place]) 05/10/2017  . Mucous cyst of digit of left hand 04/11/2017  . Cellulitis, abdominal wall 03/01/2017  . Normocytic anemia 03/01/2017  . Hypokalemia 03/01/2017  . CKD (chronic kidney disease), stage III ([place]) 03/01/2017  . Abdominal wall cellulitis 03/01/2017  . Adrenal insufficiency ([place]) 03/01/2017  . Hyperammonemia ([place])   . Cellulitis 06/28/2016  . Thrombocytopenia ([place]) 06/28/2016  . AKI (acute kidney injury) ([place]) 06/28/2016  . Multiple pulmonary nodules determined by computed tomography of lung   . Chronic cough 09/07/2015  . Hypertension, portal ([place]) 09/04/2011  . Asthma with chronic obstructive pulmonary disease (COPD) ([place]) 09/04/2011  . 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 GERD (gastroesophageal reflux disease) 07/04/2011  . Sarcoidosis 07/01/2011  . Asthma, severe persistent, with upper airway instability 06/13/2011  . 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 Allergy   . Anemia   . Asthma   . Cellulitis   . Cirrhosis ([place])   . GERD (gastroesophageal reflux disease)   . Hepatic fibrosis    congenital  . History of UTI    chronic as a child  . Sarcoidosis   . Spleen enlarged     Past Surgical History:  Procedure Laterality Date  . COLONOSCOPY    . CYST EXCISION Left 05/01/2017   Procedure: LEFT INDEX FINGER CYST EXCISION;  Surgeon: Leandrew Koyanagi,  MD;  Location: [place] [facility];  Service: Orthopedics;  Laterality: Left;  . ESOPHAGOGASTRODUODENOSCOPY    . SPINAL FUSION  12/2007   Double spinal Fusion  . VIDEO BRONCHOSCOPY Bilateral 12/08/2015   Procedure: VIDEO BRONCHOSCOPY WITH FLUORO;  Surgeon: Rigoberto Noel, MD;  Location: [facility];  Service: Cardiopulmonary;  Laterality: Bilateral;    Social History   Tobacco Use  . Smoking status: Never Smoker  . Smokeless tobacco: Never Used  Substance Use Topics  . Alcohol use: No  . Drug use: No    Family History  Problem Relation Age of Onset  . Arthritis Mother   . Heart disease Father        Triple bypass  . Migraines Father   . Heart attack Paternal Grandmother   . Stroke Paternal Grandfather   . Liver disease Sister        Juvenile cysitc liver disease  . Asthma Sister     Allergies  Allergen Reactions  . Aspirin Other (See Comments)    Causes Asthma  . Peanut-Containing Drug Products Shortness Of Breath    Asthma, eye swelling, itchy eyes, can't catch her breath  . Gabapentin Other (See Comments)    Blisters in mouth, nausea   . Azithromycin Nausea And Vomiting    Medication list has been reviewed and updated.  Current Outpatient Medications on File Prior to Visit  Medication Sig Dispense Refill  . albuterol (PROVENTIL HFA;VENTOLIN HFA) 108 (90 Base) MCG/ACT  inhaler Inhale 1-2 puffs into the lungs every 6 (six) hours as needed for wheezing or shortness of breath. 1 Inhaler 0  . benzonatate (TESSALON) 100 MG capsule Take 100 mg by mouth 3 (three) times daily as needed for cough.   2  . doxycycline (VIBRAMYCIN) 100 MG capsule Take 1 capsule (100 mg total) by mouth 2 (two) times daily. 20 capsule 0  . DULERA 200-5 MCG/ACT AERO INHALE 2 PUFFS BY MOUTH TWO TIMES DAILY. 13 Inhaler 0  . furosemide (LASIX) 40 MG tablet Take 1 tablet (40 mg total) by mouth 2 (two) times daily. (Patient taking differently: Take 120 mg by mouth 2 (two) times daily. ) 30 tablet 0  .  ipratropium-albuterol (DUONEB) 0.5-2.5 (3) MG/3ML SOLN Take 3 mLs by nebulization every 4 (four) hours as needed. 360 mL 0  . KLOR-CON M20 20 MEQ tablet TAKE 3 DOSES OF 40 MEQ (2 PILLS) OVER THE NEXT 36 HOURS. THEN RECHECK POTASSIUM LEVEL 30 tablet 3  . lactulose (CHRONULAC) 10 GM/15ML solution Take 30 g by mouth 2 (two) times daily.     . magic mouthwash SOLN Take 5 mLs by mouth 3 (three) times daily. 60 mL 0  . oxyCODONE (OXY IR/ROXICODONE) 5 MG immediate release tablet Take 1 at bedtime as needed for pain.  Take 1-2 prior to wound vac change 60 tablet 0  . potassium chloride (K-DUR) 10 MEQ tablet Take 2 tablets (20 mEq total) by mouth daily. 6 tablet 0  . predniSONE (DELTASONE) 1 MG tablet Take 1 mg by mouth every other day.   0  . ranitidine (ZANTAC) 300 MG tablet Take 1 tablet (300 mg total) by mouth at bedtime. 90 tablet 0  . saccharomyces boulardii (FLORASTOR) 250 MG capsule Take 1 capsule (250 mg total) by mouth 2 (two) times daily. 30 capsule 0  . SPIRIVA RESPIMAT 2.5 MCG/ACT AERS Inhale 2 puffs into the lungs daily at 2 PM.   11  . spironolactone (ALDACTONE) 100 MG tablet Take 300 mg by mouth daily.      No current facility-administered medications on file prior to visit.     Review of Systems:  ***  Physical Examination: There were no vitals filed for this visit. There were no vitals filed for this visit. There is no height or weight on file to calculate BMI. Ideal Body Weight:    ***  Assessment and Plan: ***  Signed Lamar Blinks, MD

## 2017-12-25 NOTE — Progress Notes (Addendum)
[place] at [facility] [place], [place], [place] [address] [phone]  Date:  12/25/2017   Name:  Alicia [name]   DOB:  03-25-1961   MRN:  [id number]  PCP:  Darreld Mclean, MD    Chief Complaint: Follow-up (Pt here to f/u on right leg wound.)   History of Present Illness:  Alicia [name] is a 57 y.o. very pleasant female patient who presents with the following:  Recent liver transplant done at [facility] on 2/27 She was in yesterday for labs- her transplant team called her as her wbc count was a bit high, they wanted her to be seen, have repeat CBC and CXR  No fever, she notes that she is really feeling better today than yesterday Following her transplant she was home for a few days but got re-admitted due to a large hematoma on her right leg.  She was released from the hospital the 2nd time this past Monday (today is Thursday)- she was put on augmentin, but did not tolerate it and vomited so she was changed to levaquin yesterday per her team at [facility] Her husband is keeping a close watch on her right leg wound and it seems to be doing ok- they do not think it seems infected  She started on levaquin yesterday- 750 mg She has not noted any urinary sx Has noted a cough but this is improved since yesterday Her baseline asthma is under control  Levada Schilling RN is her transplant coordinator at [facility]- can be reached at  [phone]  Pulse Readings from Last 3 Encounters:  12/25/17 (!) 107  12/10/17 94  08/28/17 98     Results for orders placed or performed in visit on 12/24/17  Magnesium  Result Value Ref Range   Magnesium 1.5 1.5 - 2.5 mg/dL  Comprehensive metabolic panel  Result Value Ref Range   Sodium 138 135 - 145 mEq/L   Potassium 4.5 3.5 - 5.1 mEq/L   Chloride 96 96 - 112 mEq/L   CO2 32 19 - 32 mEq/L   Glucose, Bld 141 (H) 70 - 99 mg/dL   BUN 25 (H) 6 - 23 mg/dL   Creatinine, Ser 1.84 (H) 0.40 - 1.20 mg/dL    Total Bilirubin 0.6 0.2 - 1.2 mg/dL   Alkaline Phosphatase 67 39 - 117 U/L   AST 10 0 - 37 U/L   ALT 7 0 - 35 U/L   Total Protein 6.3 6.0 - 8.3 g/dL   Albumin 3.7 3.5 - 5.2 g/dL   Calcium 9.2 8.4 - 10.5 mg/dL   GFR 29.83 (L) >60.00 mL/min  CBC with Differential/Platelet  Result Value Ref Range   WBC (H) 4.0 - 10.5 K/uL    11.1 A Manual Differential was performed and is consistent with the Automated Differential.   RBC 3.26 (L) 3.87 - 5.11 Mil/uL   Hemoglobin 9.9 (L) 12.0 - 15.0 g/dL   HCT 28.8 (L) 36.0 - 46.0 %   MCV 88.3 78.0 - 100.0 fl   MCHC 34.3 30.0 - 36.0 g/dL   RDW 21.1 (H) 11.5 - 15.5 %   Platelets 290.0 150.0 - 400.0 K/uL   Neutrophils Relative % 85.1 (H) 43.0 - 77.0 %   Lymphocytes Relative 4.4 Repeated and verified X2. (L) 12.0 - 46.0 %   Monocytes Relative 8.0 3.0 - 12.0 %   Eosinophils Relative 1.5 0.0 - 5.0 %   Basophils Relative 1.0 0.0 -  3.0 %   Neutro Abs 9.5 (H) 1.4 - 7.7 K/uL   Lymphs Abs 0.5 (L) 0.7 - 4.0 K/uL   Monocytes Absolute 0.9 0.1 - 1.0 K/uL   Eosinophils Absolute 0.2 0.0 - 0.7 K/uL   Basophils Absolute 0.1 0.0 - 0.1 K/uL   Chest film from today: CHEST - 2 VIEW  COMPARISON:  07/17/2017 FINDINGS: Enlargement of cardiac silhouette with pulmonary vascular congestion. Mediastinal contours normal. New RIGHT pleural effusion and basilar atelectasis. Underlying consolidation at RIGHT lung base not excluded. Remaining lungs clear. No pneumothorax. Prior cervical spine fusion.  IMPRESSION: Enlargement of cardiac silhouette with pulmonary vascular congestion. New RIGHT pleural effusion and basilar atelectasis, unable to exclude consolidation in RIGHT lower lobe.   Patient Active Problem List   Diagnosis Date Noted  . Syncope 07/17/2017  . Other cirrhosis of liver ([place]) 05/10/2017  . Immunocompromised patient ([place]) 05/10/2017  . Mucous cyst of digit of left hand 04/11/2017  . Cellulitis, abdominal wall 03/01/2017  . Normocytic anemia  03/01/2017  . Hypokalemia 03/01/2017  . CKD (chronic kidney disease), stage III ([place]) 03/01/2017  . Abdominal wall cellulitis 03/01/2017  . Adrenal insufficiency ([place]) 03/01/2017  . Hyperammonemia ([place])   . Cellulitis 06/28/2016  . Thrombocytopenia ([place]) 06/28/2016  . AKI (acute kidney injury) ([place]) 06/28/2016  . Multiple pulmonary nodules determined by computed tomography of lung   . Chronic cough 09/07/2015  . Hypertension, portal ([place]) 09/04/2011  . Asthma with chronic obstructive pulmonary disease (COPD) ([place]) 09/04/2011  . 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 GERD (gastroesophageal reflux disease) 07/04/2011  . Sarcoidosis 07/01/2011  . Asthma, severe persistent, with upper airway instability 06/13/2011  . 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 Allergy   . Anemia   . Asthma   . Cellulitis   . Cirrhosis ([place])   . GERD (gastroesophageal reflux disease)   . Hepatic fibrosis    congenital  . History of UTI    chronic as a child  . Sarcoidosis   . Spleen enlarged     Past Surgical History:  Procedure Laterality Date  . COLONOSCOPY    . CYST EXCISION Left 05/01/2017   Procedure: LEFT INDEX FINGER CYST EXCISION;  Surgeon: Leandrew Koyanagi, MD;  Location: [facility];  Service: Orthopedics;  Laterality: Left;  . ESOPHAGOGASTRODUODENOSCOPY    . SPINAL FUSION  12/2007   Double spinal Fusion  . VIDEO BRONCHOSCOPY Bilateral 12/08/2015   Procedure: VIDEO BRONCHOSCOPY WITH FLUORO;  Surgeon: Rigoberto Noel, MD;  Location: [facility];  Service: Cardiopulmonary;  Laterality: Bilateral;    Social History   Tobacco Use  . Smoking status: Never Smoker  . Smokeless tobacco: Never Used  Substance Use Topics  . Alcohol use: No  . Drug use: No    Family History  Problem Relation Age of Onset  . Arthritis Mother   . Heart disease Father        Triple bypass  . Migraines Father   . Heart attack Paternal Grandmother   . Stroke Paternal  Grandfather   . Liver disease Sister        Juvenile cysitc liver disease  . Asthma Sister     Allergies  Allergen Reactions  . Aspirin Other (See Comments)    Causes Asthma  . Peanut-Containing Drug Products Shortness Of Breath    Asthma, eye swelling, itchy eyes, can't catch her breath  . Gabapentin Other (See Comments)    Blisters in  mouth, nausea   . Azithromycin Nausea And Vomiting    Medication list has been reviewed and updated.  Current Outpatient Medications on File Prior to Visit  Medication Sig Dispense Refill  . albuterol (PROVENTIL HFA;VENTOLIN HFA) 108 (90 Base) MCG/ACT inhaler Inhale 1-2 puffs into the lungs every 6 (six) hours as needed for wheezing or shortness of breath. 1 Inhaler 0  . benzonatate (TESSALON) 100 MG capsule Take 100 mg by mouth 3 (three) times daily as needed for cough.   2  . doxycycline (VIBRAMYCIN) 100 MG capsule Take 1 capsule (100 mg total) by mouth 2 (two) times daily. 20 capsule 0  . DULERA 200-5 MCG/ACT AERO INHALE 2 PUFFS BY MOUTH TWO TIMES DAILY. 13 Inhaler 0  . furosemide (LASIX) 40 MG tablet Take 1 tablet (40 mg total) by mouth 2 (two) times daily. (Patient taking differently: Take 120 mg by mouth 2 (two) times daily. ) 30 tablet 0  . ipratropium-albuterol (DUONEB) 0.5-2.5 (3) MG/3ML SOLN Take 3 mLs by nebulization every 4 (four) hours as needed. 360 mL 0  . KLOR-CON M20 20 MEQ tablet TAKE 3 DOSES OF 40 MEQ (2 PILLS) OVER THE NEXT 36 HOURS. THEN RECHECK POTASSIUM LEVEL 30 tablet 3  . lactulose (CHRONULAC) 10 GM/15ML solution Take 30 g by mouth 2 (two) times daily.     Marland Kitchen levofloxacin (LEVAQUIN) 750 MG tablet Take 1 tablet (750 mg total) by mouth once daily    . magic mouthwash SOLN Take 5 mLs by mouth 3 (three) times daily. 60 mL 0  . oxyCODONE (OXY IR/ROXICODONE) 5 MG immediate release tablet Take 1 at bedtime as needed for pain.  Take 1-2 prior to wound vac change 60 tablet 0  . potassium chloride (K-DUR) 10 MEQ tablet Take 2 tablets (20  mEq total) by mouth daily. 6 tablet 0  . predniSONE (DELTASONE) 1 MG tablet Take 1 mg by mouth every other day.   0  . ranitidine (ZANTAC) 300 MG tablet Take 1 tablet (300 mg total) by mouth at bedtime. 90 tablet 0  . saccharomyces boulardii (FLORASTOR) 250 MG capsule Take 1 capsule (250 mg total) by mouth 2 (two) times daily. 30 capsule 0  . SPIRIVA RESPIMAT 2.5 MCG/ACT AERS Inhale 2 puffs into the lungs daily at 2 PM.   11  . spironolactone (ALDACTONE) 100 MG tablet Take 300 mg by mouth daily.      No current facility-administered medications on file prior to visit.     Review of Systems:  As per HPI- otherwise negative.   Physical Examination: Vitals:   12/25/17 1150  BP: (!) 144/82  Pulse: (!) 107  Temp: 98.1 F (36.7 C)  SpO2: 93%   Vitals:   12/25/17 1150  Weight: 144 lb (65.3 kg)  Height: 5\' 2"  (1.575 m)   Body mass index is 26.34 kg/m. Ideal Body Weight: Weight in (lb) to have BMI = 25: 136.4  GEN: WDWN, NAD, Non-toxic, A & O x 3, continues to look in better health than prior to her transplant.  Scleral icterus has cleared and her color is much better HEENT: Atraumatic, Normocephalic. Neck supple. No masses, No LAD. Ears and Nose: No external deformity. CV: RRR, No M/G/R. No JVD. No thrill. No extra heart sounds. PULM: CTA B, no wheezes, crackles, rhonchi. No retractions. No resp. distress. No accessory muscle use.  Decreased breath sounds right base c/w effusion ABD: S, NT, ND, +BS. No rebound. No HSM. EXTR: No c/c NEURO Normal gait.  PSYCH: Normally interactive. Conversant. Not depressed or anxious appearing.  Calm demeanor.  Right leg looks ok- wound does not appear to be infected.  She does have mild edema of both LE still but much better and weight is down 20 lbs per her report  I have called and discussed her case in detail with Collie Siad, her transplant coordinator at [facility] and Plan: HAP (hospital-acquired pneumonia)  Liver transplant recipient [facility])  - Plan: Magnesium, Tacrolimus,Highly Sensitive,LC/MS/MS, Comprehensive metabolic panel, CBC  Status post liver transplant ([place])  Here today to follow-up on mild leukocytosis noted yesterday.  Her transplant team asked her to come in for a CXR and repeat labs.  Her CXR does show a possible pneumonia.  Effusion is new to [place], but per her transplant team at [facility] this is stable from her films while in pt there recently.  Also, her sats were in the low 90s at the end of her admission Collie Siad has discussed case with her transplant surgeon and ID team, and for the time being we will continue levaquin.  Rebbecca is being seen at [facility] this coming Tuesday and knows to contact me if any concerns in the meantime   Signed Lamar Blinks, MD  Her creat clearance is about 30, or just under.  We may need to consider changing her levaquin to QOD dosing.  Will touch base with her transplant team tomorrow regarding this  Chinita [place] and spoke with her on 3/29- they are aware of labs and she is consulting with ID.  They may end up having to admit [place] again as she has been running a low grade temp   Results for orders placed or performed in visit on 12/25/17  Magnesium  Result Value Ref Range   Magnesium 1.7 1.5 - 2.5 mg/dL  Comprehensive metabolic panel  Result Value Ref Range   Sodium 136 135 - 145 mEq/L   Potassium 4.5 3.5 - 5.1 mEq/L   Chloride 95 (L) 96 - 112 mEq/L   CO2 30 19 - 32 mEq/L   Glucose, Bld 129 (H) 70 - 99 mg/dL   BUN 26 (H) 6 - 23 mg/dL   Creatinine, Ser 2.11 (H) 0.40 - 1.20 mg/dL   Total Bilirubin 0.6 0.2 - 1.2 mg/dL   Alkaline Phosphatase 71 39 - 117 U/L   AST 12 0 - 37 U/L   ALT 11 0 - 35 U/L   Total Protein 6.6 6.0 - 8.3 g/dL   Albumin 3.8 3.5 - 5.2 g/dL   Calcium 9.6 8.4 - 10.5 mg/dL   GFR 25.47 (L) >60.00 mL/min  CBC  Result Value Ref Range   WBC 12.7 (H) 4.0 - 10.5 K/uL   RBC 3.42 (L) 3.87 - 5.11 Mil/uL   Platelets 327.0 150.0 - 400.0 K/uL   Hemoglobin 10.3 (L) 12.0 - 15.0  g/dL   HCT 30.0 (L) 36.0 - 46.0 %   MCV 87.7 78.0 - 100.0 fl   MCHC 34.4 30.0 - 36.0 g/dL   RDW 21.4 (H) 11.5 - 15.5 %

## 2017-12-26 LAB — TACROLIMUS,HIGHLY SENSITIVE,LC/MS/MS: TACROLIMUS LVL: 30 ug/L — AB

## 2017-12-26 NOTE — Telephone Encounter (Signed)
Copied from [place] [phone]. Topic: Medical Record Request - Provider/Facility Request >> Dec 26, 2017 11:06 AM Ether Griffins B wrote: Patient Name/DOB/MRN #: [id number] Requestor Name/Agency: [facility]  Fax #: [phone]  Information Requested: all labs from 12/24/17

## 2017-12-26 NOTE — Telephone Encounter (Signed)
Lab results from 12/24/17 faxed to [facility] as requested. Fax number [phone]. Fax confirmation received.   Copied from [place] [phone]. Topic: Medical Record Request - Provider/Facility Request >> Dec 26, 2017 11:06 AM Ether Griffins B wrote: Patient Name/DOB/MRN #: [id number] Requestor Name/Agency: [facility]  Fax #: [phone]  Information Requested: all labs from 12/24/17

## 2017-12-28 ENCOUNTER — Encounter: Payer: Self-pay | Admitting: Family Medicine

## 2017-12-28 NOTE — Telephone Encounter (Signed)
Called and spoke with Alicia [name] is admitted again unfortunately with a liver abscess.  They have addressed her tacrolimus already.  Passed along my best wishes for a quick recovery from this setback

## 2017-12-29 ENCOUNTER — Telehealth: Payer: Self-pay | Admitting: Emergency Medicine

## 2017-12-29 ENCOUNTER — Ambulatory Visit: Payer: BLUE CROSS/BLUE SHIELD | Admitting: Family Medicine

## 2017-12-29 LAB — TACROLIMUS,HIGHLY SENSITIVE,LC/MS/MS: Tacrolimus Lvl: 5.3 mcg/L

## 2017-12-29 NOTE — Telephone Encounter (Signed)
Copied from [place] [phone]. Topic: Medical Record Request - Provider/Facility Request >> Dec 26, 2017 11:06 AM Ether Griffins B wrote: Patient Name/DOB/MRN #: [id number] Requestor Name/Agency: [facility]  Fax #: [phone]  Information Requested: all labs from 12/24/17

## 2017-12-29 NOTE — Telephone Encounter (Signed)
Labs have been faxed to [facility] as requested.   Copied from [place] [phone]. Topic: Medical Record Request - Provider/Facility Request >> Dec 26, 2017 11:06 AM Ether Griffins B wrote: Patient Name/DOB/MRN #: [id number] Requestor Name/Agency: [facility]  Fax #: [phone]  Information Requested: all labs from 12/24/17

## 2018-01-15 ENCOUNTER — Other Ambulatory Visit (INDEPENDENT_AMBULATORY_CARE_PROVIDER_SITE_OTHER): Payer: BLUE CROSS/BLUE SHIELD

## 2018-01-15 DIAGNOSIS — Z944 Liver transplant status: Secondary | ICD-10-CM

## 2018-01-15 LAB — COMPREHENSIVE METABOLIC PANEL
ALT: 8 U/L (ref 0–35)
AST: 11 U/L (ref 0–37)
Albumin: 3.5 g/dL (ref 3.5–5.2)
Alkaline Phosphatase: 53 U/L (ref 39–117)
BUN: 26 mg/dL — ABNORMAL HIGH (ref 6–23)
CALCIUM: 8.3 mg/dL — AB (ref 8.4–10.5)
CHLORIDE: 105 meq/L (ref 96–112)
CO2: 23 meq/L (ref 19–32)
CREATININE: 1.9 mg/dL — AB (ref 0.40–1.20)
GFR: 28.74 mL/min — ABNORMAL LOW (ref >60.00)
Glucose, Bld: 160 mg/dL — ABNORMAL HIGH (ref 70–99)
Potassium: 4.3 mEq/L (ref 3.5–5.1)
Sodium: 137 mEq/L (ref 135–145)
Total Bilirubin: 0.3 mg/dL (ref 0.2–1.2)
Total Protein: 5.5 g/dL — ABNORMAL LOW (ref 6.0–8.3)

## 2018-01-15 LAB — CBC
HEMATOCRIT: 27.6 % — AB (ref 36.0–46.0)
Hemoglobin: 9.3 g/dL — ABNORMAL LOW (ref 12.0–15.0)
MCHC: 33.8 g/dL (ref 30.0–36.0)
MCV: 90.4 fl (ref 78.0–100.0)
PLATELETS: 207 10*3/uL (ref 150.0–400.0)
RBC: 3.06 Mil/uL — ABNORMAL LOW (ref 3.87–5.11)
RDW: 19.2 % — ABNORMAL HIGH (ref 11.5–15.5)
WBC: 7.4 10*3/uL (ref 4.0–10.5)

## 2018-01-15 LAB — MAGNESIUM: Magnesium: 1.5 mg/dL (ref 1.5–2.5)

## 2018-01-16 ENCOUNTER — Encounter: Payer: Self-pay | Admitting: Family Medicine

## 2018-01-19 LAB — TACROLIMUS,HIGHLY SENSITIVE,LC/MS/MS: TACROLIMUS LVL: 5.2 ug/L

## 2018-01-20 ENCOUNTER — Telehealth: Payer: Self-pay | Admitting: Family Medicine

## 2018-01-20 NOTE — Telephone Encounter (Signed)
Called her transplant coordinator Collie Siad and left a message on her direct VM.  Wanted to make sure they had seen her recent labs , please call me if needed.  Gave my cell number

## 2018-01-27 ENCOUNTER — Other Ambulatory Visit (INDEPENDENT_AMBULATORY_CARE_PROVIDER_SITE_OTHER): Payer: BLUE CROSS/BLUE SHIELD

## 2018-01-27 ENCOUNTER — Telehealth: Payer: Self-pay | Admitting: Family Medicine

## 2018-01-27 DIAGNOSIS — Z944 Liver transplant status: Secondary | ICD-10-CM | POA: Diagnosis not present

## 2018-01-27 LAB — CBC
HCT: 29.2 % — ABNORMAL LOW (ref 36.0–46.0)
HEMOGLOBIN: 10.1 g/dL — AB (ref 12.0–15.0)
MCHC: 34.5 g/dL (ref 30.0–36.0)
MCV: 90.9 fl (ref 78.0–100.0)
Platelets: 143 10*3/uL — ABNORMAL LOW (ref 150.0–400.0)
RBC: 3.22 Mil/uL — AB (ref 3.87–5.11)
RDW: 16.2 % — AB (ref 11.5–15.5)
WBC: 5.6 10*3/uL (ref 4.0–10.5)

## 2018-01-27 LAB — COMPREHENSIVE METABOLIC PANEL
ALBUMIN: 3.7 g/dL (ref 3.5–5.2)
ALK PHOS: 51 U/L (ref 39–117)
ALT: 7 U/L (ref 0–35)
AST: 8 U/L (ref 0–37)
BUN: 27 mg/dL — ABNORMAL HIGH (ref 6–23)
CALCIUM: 8.9 mg/dL (ref 8.4–10.5)
CHLORIDE: 107 meq/L (ref 96–112)
CO2: 26 mEq/L (ref 19–32)
CREATININE: 1.49 mg/dL — AB (ref 0.40–1.20)
GFR: 38.04 mL/min — ABNORMAL LOW (ref >60.00)
Glucose, Bld: 252 mg/dL — ABNORMAL HIGH (ref 70–99)
POTASSIUM: 5.5 meq/L — AB (ref 3.5–5.1)
Sodium: 142 mEq/L (ref 135–145)
TOTAL PROTEIN: 5.6 g/dL — AB (ref 6.0–8.3)
Total Bilirubin: 0.4 mg/dL (ref 0.2–1.2)

## 2018-01-27 LAB — MAGNESIUM: Magnesium: 1.6 mg/dL (ref 1.5–2.5)

## 2018-01-27 NOTE — Telephone Encounter (Signed)
Called her about labs from today Results for orders placed or performed in visit on 01/27/18  Magnesium  Result Value Ref Range   Magnesium 1.6 1.5 - 2.5 mg/dL  Comprehensive metabolic panel  Result Value Ref Range   Sodium 142 135 - 145 mEq/L   Potassium 5.5 (H) 3.5 - 5.1 mEq/L   Chloride 107 96 - 112 mEq/L   CO2 26 19 - 32 mEq/L   Glucose, Bld 252 (H) 70 - 99 mg/dL   BUN 27 (H) 6 - 23 mg/dL   Creatinine, Ser 1.49 (H) 0.40 - 1.20 mg/dL   Total Bilirubin 0.4 0.2 - 1.2 mg/dL   Alkaline Phosphatase 51 39 - 117 U/L   AST 8 0 - 37 U/L   ALT 7 0 - 35 U/L   Total Protein 5.6 (L) 6.0 - 8.3 g/dL   Albumin 3.7 3.5 - 5.2 g/dL   Calcium 8.9 8.4 - 10.5 mg/dL   GFR 38.04 (L) >60.00 mL/min  CBC  Result Value Ref Range   WBC 5.6 4.0 - 10.5 K/uL   RBC 3.22 (L) 3.87 - 5.11 Mil/uL   Platelets 143.0 (L) 150.0 - 400.0 K/uL   Hemoglobin 10.1 (L) 12.0 - 15.0 g/dL   HCT 29.2 (L) 36.0 - 46.0 %   MCV 90.9 78.0 - 100.0 fl   MCHC 34.5 30.0 - 36.0 g/dL   RDW 16.2 (H) 11.5 - 15.5 %   Overall look good, but her K is high.  She is not taking any potassium, but is taking 100 mg of spiro per her report.  Asked her to halve this until she talks with her team at [facility] (they have requested that we draw these labs and have been following the results for her.   She will let me know if she does not hear from [facility] with further instructions in the next day or two JC

## 2018-01-28 NOTE — Addendum Note (Signed)
Addended by: Harl Bowie on: 01/28/2018 04:43 PM   Modules accepted: Orders

## 2018-01-28 NOTE — Addendum Note (Signed)
Addended by: Harl Bowie on: 01/28/2018 04:39 PM   Modules accepted: Orders

## 2018-01-29 LAB — TACROLIMUS,HIGHLY SENSITIVE,LC/MS/MS: TACROLIMUS LVL: 10 ug/L

## 2018-02-04 ENCOUNTER — Other Ambulatory Visit (INDEPENDENT_AMBULATORY_CARE_PROVIDER_SITE_OTHER): Payer: BLUE CROSS/BLUE SHIELD

## 2018-02-04 DIAGNOSIS — Z944 Liver transplant status: Secondary | ICD-10-CM | POA: Diagnosis not present

## 2018-02-04 LAB — COMPREHENSIVE METABOLIC PANEL
ALBUMIN: 3.7 g/dL (ref 3.5–5.2)
ALK PHOS: 44 U/L (ref 39–117)
ALT: 6 U/L (ref 0–35)
AST: 10 U/L (ref 0–37)
BUN: 30 mg/dL — ABNORMAL HIGH (ref 6–23)
CALCIUM: 9 mg/dL (ref 8.4–10.5)
CHLORIDE: 106 meq/L (ref 96–112)
CO2: 28 mEq/L (ref 19–32)
CREATININE: 1.43 mg/dL — AB (ref 0.40–1.20)
GFR: 39.89 mL/min — ABNORMAL LOW (ref >60.00)
Glucose, Bld: 125 mg/dL — ABNORMAL HIGH (ref 70–99)
Potassium: 4.7 mEq/L (ref 3.5–5.1)
Sodium: 141 mEq/L (ref 135–145)
Total Bilirubin: 0.4 mg/dL (ref 0.2–1.2)
Total Protein: 5.5 g/dL — ABNORMAL LOW (ref 6.0–8.3)

## 2018-02-04 LAB — MAGNESIUM: Magnesium: 1.7 mg/dL (ref 1.5–2.5)

## 2018-02-04 LAB — CBC
HEMATOCRIT: 29.7 % — AB (ref 36.0–46.0)
Hemoglobin: 10.2 g/dL — ABNORMAL LOW (ref 12.0–15.0)
MCHC: 34.4 g/dL (ref 30.0–36.0)
MCV: 90.7 fl (ref 78.0–100.0)
Platelets: 144 10*3/uL — ABNORMAL LOW (ref 150.0–400.0)
RBC: 3.27 Mil/uL — ABNORMAL LOW (ref 3.87–5.11)
RDW: 15.7 % — AB (ref 11.5–15.5)
WBC: 5.9 10*3/uL (ref 4.0–10.5)

## 2018-02-06 LAB — TACROLIMUS,HIGHLY SENSITIVE,LC/MS/MS: TACROLIMUS LVL: 5.1 ug/L

## 2018-02-07 ENCOUNTER — Encounter: Payer: Self-pay | Admitting: Family Medicine

## 2018-02-17 ENCOUNTER — Other Ambulatory Visit (INDEPENDENT_AMBULATORY_CARE_PROVIDER_SITE_OTHER): Payer: BLUE CROSS/BLUE SHIELD

## 2018-02-17 DIAGNOSIS — Z944 Liver transplant status: Secondary | ICD-10-CM

## 2018-02-17 LAB — COMPREHENSIVE METABOLIC PANEL
ALT: 3 U/L (ref 0–35)
AST: 6 U/L (ref 0–37)
Albumin: 3.9 g/dL (ref 3.5–5.2)
Alkaline Phosphatase: 43 U/L (ref 39–117)
BUN: 20 mg/dL (ref 6–23)
CO2: 24 meq/L (ref 19–32)
Calcium: 8.4 mg/dL (ref 8.4–10.5)
Chloride: 109 mEq/L (ref 96–112)
Creatinine, Ser: 1.49 mg/dL — ABNORMAL HIGH (ref 0.40–1.20)
GFR: 38.04 mL/min — AB (ref >60.00)
GLUCOSE: 105 mg/dL — AB (ref 70–99)
POTASSIUM: 4.8 meq/L (ref 3.5–5.1)
Sodium: 143 mEq/L (ref 135–145)
TOTAL PROTEIN: 5.7 g/dL — AB (ref 6.0–8.3)
Total Bilirubin: 0.4 mg/dL (ref 0.2–1.2)

## 2018-02-17 LAB — MAGNESIUM: Magnesium: 1.8 mg/dL (ref 1.5–2.5)

## 2018-02-17 LAB — CBC
HCT: 29.9 % — ABNORMAL LOW (ref 36.0–46.0)
HEMOGLOBIN: 10.3 g/dL — AB (ref 12.0–15.0)
MCHC: 34.5 g/dL (ref 30.0–36.0)
MCV: 91.6 fl (ref 78.0–100.0)
PLATELETS: 140 10*3/uL — AB (ref 150.0–400.0)
RBC: 3.26 Mil/uL — ABNORMAL LOW (ref 3.87–5.11)
RDW: 16.1 % — ABNORMAL HIGH (ref 11.5–15.5)
WBC: 3.6 10*3/uL — ABNORMAL LOW (ref 4.0–10.5)

## 2018-02-19 LAB — TACROLIMUS,HIGHLY SENSITIVE,LC/MS/MS: TACROLIMUS LVL: 4.4 ug/L — AB

## 2018-02-20 ENCOUNTER — Telehealth: Payer: Self-pay

## 2018-02-20 NOTE — Telephone Encounter (Signed)
CRITICAL VALUE STICKER  CRITICAL VALUE: Tacrolimus Lvl 4.4  RECEIVER (on-site recipient of call): [place] NOTIFIED: 5686 02/20/18   MD NOTIFIED:Melissa O'sullivan FNP   TIME OF NOTIFICATION:1505  RESPONSE: Routed to DOD. Melissa states she will take a look at it for further evaluation and instructions.

## 2018-02-20 NOTE — Telephone Encounter (Signed)
Contacted [facility] Transplant Coordinator Manuela Schwartz). She has already reviewed this result and has reached out to the patient to discuss increasing prograf level and will follow up with patient.

## 2018-02-20 NOTE — Telephone Encounter (Signed)
I'm sorry Alicia [name]- just text me if there are any issues with my patients, I am on. Sorry you were bothered!

## 2018-03-02 ENCOUNTER — Other Ambulatory Visit (INDEPENDENT_AMBULATORY_CARE_PROVIDER_SITE_OTHER): Payer: BLUE CROSS/BLUE SHIELD

## 2018-03-02 DIAGNOSIS — Z944 Liver transplant status: Secondary | ICD-10-CM | POA: Diagnosis not present

## 2018-03-02 LAB — COMPREHENSIVE METABOLIC PANEL
ALK PHOS: 41 U/L (ref 39–117)
ALT: 5 U/L (ref 0–35)
AST: 7 U/L (ref 0–37)
Albumin: 4 g/dL (ref 3.5–5.2)
BILIRUBIN TOTAL: 0.4 mg/dL (ref 0.2–1.2)
BUN: 20 mg/dL (ref 6–23)
CALCIUM: 8.7 mg/dL (ref 8.4–10.5)
CO2: 24 mEq/L (ref 19–32)
Chloride: 110 mEq/L (ref 96–112)
Creatinine, Ser: 1.5 mg/dL — ABNORMAL HIGH (ref 0.40–1.20)
GFR: 37.74 mL/min — AB (ref >60.00)
GLUCOSE: 148 mg/dL — AB (ref 70–99)
POTASSIUM: 3.7 meq/L (ref 3.5–5.1)
Sodium: 142 mEq/L (ref 135–145)
TOTAL PROTEIN: 5.6 g/dL — AB (ref 6.0–8.3)

## 2018-03-02 LAB — CBC
HCT: 28.7 % — ABNORMAL LOW (ref 36.0–46.0)
HEMOGLOBIN: 10.2 g/dL — AB (ref 12.0–15.0)
MCHC: 35.5 g/dL (ref 30.0–36.0)
MCV: 91 fl (ref 78.0–100.0)
PLATELETS: 146 10*3/uL — AB (ref 150.0–400.0)
RBC: 3.15 Mil/uL — AB (ref 3.87–5.11)
RDW: 15.9 % — ABNORMAL HIGH (ref 11.5–15.5)
WBC: 3.1 10*3/uL — ABNORMAL LOW (ref 4.0–10.5)

## 2018-03-02 LAB — MAGNESIUM: MAGNESIUM: 1.4 mg/dL — AB (ref 1.5–2.5)

## 2018-03-04 LAB — TACROLIMUS,HIGHLY SENSITIVE,LC/MS/MS: TACROLIMUS LVL: 5 ug/L

## 2018-03-17 ENCOUNTER — Other Ambulatory Visit: Payer: BLUE CROSS/BLUE SHIELD

## 2018-03-17 ENCOUNTER — Encounter: Payer: Self-pay | Admitting: Family Medicine

## 2018-03-17 ENCOUNTER — Telehealth: Payer: Self-pay | Admitting: Family Medicine

## 2018-03-17 DIAGNOSIS — Z944 Liver transplant status: Secondary | ICD-10-CM

## 2018-03-17 NOTE — Telephone Encounter (Signed)
-----   Message from Caffie Pinto sent at 03/17/2018  9:42 AM EDT ----- Regarding: Lab Order Hey Dr. Lorelei Pont,                  Mrs. Soper came in for her labs this morning, and her Doctor from [place] Dr. [facility] Melia wanted [place] to do a Fructosamine. The only way we can do it is if you order it and fax results to them. I explained that to her and she was understanding and will come back tomorrow either way. We can place the order if your ok with it.                          Kristy P

## 2018-03-18 ENCOUNTER — Other Ambulatory Visit (INDEPENDENT_AMBULATORY_CARE_PROVIDER_SITE_OTHER): Payer: BLUE CROSS/BLUE SHIELD

## 2018-03-18 DIAGNOSIS — Z944 Liver transplant status: Secondary | ICD-10-CM

## 2018-03-18 LAB — COMPREHENSIVE METABOLIC PANEL
ALT: 7 U/L (ref 0–35)
AST: 7 U/L (ref 0–37)
Albumin: 4 g/dL (ref 3.5–5.2)
Alkaline Phosphatase: 42 U/L (ref 39–117)
BUN: 22 mg/dL (ref 6–23)
CHLORIDE: 111 meq/L (ref 96–112)
CO2: 28 meq/L (ref 19–32)
CREATININE: 1.48 mg/dL — AB (ref 0.40–1.20)
Calcium: 9.4 mg/dL (ref 8.4–10.5)
GFR: 38.32 mL/min — ABNORMAL LOW (ref >60.00)
GLUCOSE: 102 mg/dL — AB (ref 70–99)
Potassium: 4.8 mEq/L (ref 3.5–5.1)
SODIUM: 144 meq/L (ref 135–145)
Total Bilirubin: 0.4 mg/dL (ref 0.2–1.2)
Total Protein: 5.5 g/dL — ABNORMAL LOW (ref 6.0–8.3)

## 2018-03-18 LAB — CBC
HEMATOCRIT: 27 % — AB (ref 36.0–46.0)
Hemoglobin: 9.6 g/dL — ABNORMAL LOW (ref 12.0–15.0)
MCHC: 35.5 g/dL (ref 30.0–36.0)
MCV: 90.5 fl (ref 78.0–100.0)
Platelets: 126 10*3/uL — ABNORMAL LOW (ref 150.0–400.0)
RBC: 2.98 Mil/uL — ABNORMAL LOW (ref 3.87–5.11)
RDW: 15.4 % (ref 11.5–15.5)
WBC: 4.6 10*3/uL (ref 4.0–10.5)

## 2018-03-18 LAB — MAGNESIUM: MAGNESIUM: 1.9 mg/dL (ref 1.5–2.5)

## 2018-03-20 LAB — FRUCTOSAMINE: FRUCTOSAMINE: 226 umol/L (ref 190–270)

## 2018-03-20 LAB — TACROLIMUS,HIGHLY SENSITIVE,LC/MS/MS: TACROLIMUS LVL: 4.5 ug/L — AB

## 2018-04-08 ENCOUNTER — Encounter: Payer: Self-pay | Admitting: Family Medicine

## 2018-04-10 ENCOUNTER — Telehealth: Payer: Self-pay | Admitting: Family Medicine

## 2018-04-10 NOTE — Telephone Encounter (Signed)
Called her back- spoke with Dr. Enis Gash They think she is having neuropathic pain from her recent operation.   She is intolerant of gabapentin- Dr. Loletha Grayer discussed potential plans with her pharmD and wanted to let me know that these plans are outlined in her recent note from 7/1, as below:  She notes having issues with incisional pain that seems to be neuropathic pain at this time. She has been intolerant of gabapentin in the past (caused urticaria and vomiting). She has made inquiry about options for pain medication, stating that she does not wish to take narcotic medication. Per discussions with our transplant pharmacist, there could potentially be consideration for use of an SNRI such as Cymbalta or a TCA such as amitryptyline. Although Lyrica could potentially be tried, it is reported to have cross-reactivity with gabapentin, and this would place her at risk of experiencing reactions with Lyrica that are similar to reactions she has experienced with gabapentin. I will communicate these considerations to her primary care provider and ask that she returns to her primary care provider for assistance with pain management.    I have asked Deven to come in for a visit and will plan to see her and discuss face to face soon

## 2018-04-10 NOTE — Telephone Encounter (Signed)
Copied from [place] [id number]. Topic: Quick Communication - See Telephone Encounter >> Apr 10, 2018  3:58 PM Rutherford Nail, NT wrote: CRM for notification. See Telephone encounter for: 04/10/18. Dr Loletha Grayer with [facility] requesting a call back from Dr Lorelei Pont before the patient's appointment on 04/15/18. Please advise. CB#: [phone]
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
## 2018-04-15 ENCOUNTER — Encounter: Payer: Self-pay | Admitting: Family Medicine

## 2018-04-15 ENCOUNTER — Ambulatory Visit (INDEPENDENT_AMBULATORY_CARE_PROVIDER_SITE_OTHER): Payer: BLUE CROSS/BLUE SHIELD | Admitting: Family Medicine

## 2018-04-15 VITALS — BP 110/70 | HR 77 | Resp 16 | Ht 62.0 in | Wt 133.2 lb

## 2018-04-15 DIAGNOSIS — Z944 Liver transplant status: Secondary | ICD-10-CM | POA: Diagnosis not present

## 2018-04-15 DIAGNOSIS — M792 Neuralgia and neuritis, unspecified: Secondary | ICD-10-CM | POA: Diagnosis not present

## 2018-04-15 MED ORDER — AMITRIPTYLINE HCL 25 MG PO TABS: ORAL_TABLET | ORAL | 3 refills | Status: DC

## 2018-04-15 NOTE — Patient Instructions (Signed)
It was great to see you again today - you look fabulous!   We will have you start on amitriptyline 25 mg at bedtime, you can increase to 2 pills after 1-2 weeks if needed Let me know how this work for your pain

## 2018-04-20 ENCOUNTER — Other Ambulatory Visit (INDEPENDENT_AMBULATORY_CARE_PROVIDER_SITE_OTHER): Payer: BLUE CROSS/BLUE SHIELD

## 2018-04-20 DIAGNOSIS — Z944 Liver transplant status: Secondary | ICD-10-CM | POA: Diagnosis not present

## 2018-04-20 LAB — COMPREHENSIVE METABOLIC PANEL
ALT: 9 U/L (ref 0–35)
AST: 8 U/L (ref 0–37)
Albumin: 4.1 g/dL (ref 3.5–5.2)
Alkaline Phosphatase: 61 U/L (ref 39–117)
BUN: 25 mg/dL — ABNORMAL HIGH (ref 6–23)
CHLORIDE: 105 meq/L (ref 96–112)
CO2: 27 meq/L (ref 19–32)
Calcium: 8.8 mg/dL (ref 8.4–10.5)
Creatinine, Ser: 1.61 mg/dL — ABNORMAL HIGH (ref 0.40–1.20)
GFR: 34.76 mL/min — ABNORMAL LOW (ref >60.00)
Glucose, Bld: 107 mg/dL — ABNORMAL HIGH (ref 70–99)
POTASSIUM: 3.9 meq/L (ref 3.5–5.1)
SODIUM: 140 meq/L (ref 135–145)
Total Bilirubin: 0.4 mg/dL (ref 0.2–1.2)
Total Protein: 5.8 g/dL — ABNORMAL LOW (ref 6.0–8.3)

## 2018-04-20 LAB — CBC
HCT: 28.6 % — ABNORMAL LOW (ref 36.0–46.0)
HEMOGLOBIN: 10 g/dL — AB (ref 12.0–15.0)
MCHC: 35.1 g/dL (ref 30.0–36.0)
MCV: 88.3 fl (ref 78.0–100.0)
Platelets: 143 10*3/uL — ABNORMAL LOW (ref 150.0–400.0)
RBC: 3.24 Mil/uL — ABNORMAL LOW (ref 3.87–5.11)
RDW: 13.2 % (ref 11.5–15.5)
WBC: 5.6 10*3/uL (ref 4.0–10.5)

## 2018-04-20 LAB — MAGNESIUM: Magnesium: 1.7 mg/dL (ref 1.5–2.5)

## 2018-04-22 LAB — TACROLIMUS,HIGHLY SENSITIVE,LC/MS/MS: TACROLIMUS LVL: 4.5 ug/L — AB

## 2018-04-27 ENCOUNTER — Telehealth: Payer: Self-pay

## 2018-04-27 DIAGNOSIS — Z944 Liver transplant status: Secondary | ICD-10-CM

## 2018-04-27 NOTE — Telephone Encounter (Signed)
Copied from [place] [id number]. Topic: General - Other >> Apr 27, 2018  8:37 AM Judyann Munson wrote: Reason for CRM: patient is calling to request labs to be schedule,  she is requesting  a call back on [phone]. Please advise   >> Apr 27, 2018  4:23 PM Keene Breath wrote: Patient called to request a redraw of her labs which [facility] requested her to do.  She is not sure if Dr. Lorelei Pont has to approve, but she says she has standing orders to draw blood because she is a liver transplant patient.  Please advise.

## 2018-04-28 ENCOUNTER — Other Ambulatory Visit (INDEPENDENT_AMBULATORY_CARE_PROVIDER_SITE_OTHER): Payer: BLUE CROSS/BLUE SHIELD

## 2018-04-28 ENCOUNTER — Encounter: Payer: Self-pay | Admitting: Family Medicine

## 2018-04-28 DIAGNOSIS — Z944 Liver transplant status: Secondary | ICD-10-CM

## 2018-04-28 LAB — COMPREHENSIVE METABOLIC PANEL
ALK PHOS: 61 U/L (ref 39–117)
ALT: 8 U/L (ref 0–35)
AST: 8 U/L (ref 0–37)
Albumin: 4.1 g/dL (ref 3.5–5.2)
BUN: 23 mg/dL (ref 6–23)
CALCIUM: 8.9 mg/dL (ref 8.4–10.5)
CO2: 29 mEq/L (ref 19–32)
Chloride: 104 mEq/L (ref 96–112)
Creatinine, Ser: 1.51 mg/dL — ABNORMAL HIGH (ref 0.40–1.20)
GFR: 37.43 mL/min — AB (ref >60.00)
GLUCOSE: 201 mg/dL — AB (ref 70–99)
POTASSIUM: 4.1 meq/L (ref 3.5–5.1)
Sodium: 140 mEq/L (ref 135–145)
TOTAL PROTEIN: 5.6 g/dL — AB (ref 6.0–8.3)
Total Bilirubin: 0.5 mg/dL (ref 0.2–1.2)

## 2018-04-28 LAB — CBC
HCT: 28.3 % — ABNORMAL LOW (ref 36.0–46.0)
HEMOGLOBIN: 10 g/dL — AB (ref 12.0–15.0)
MCHC: 35.2 g/dL (ref 30.0–36.0)
MCV: 88.7 fl (ref 78.0–100.0)
PLATELETS: 133 10*3/uL — AB (ref 150.0–400.0)
RBC: 3.19 Mil/uL — AB (ref 3.87–5.11)
RDW: 13.5 % (ref 11.5–15.5)
WBC: 4.5 10*3/uL (ref 4.0–10.5)

## 2018-04-28 LAB — MAGNESIUM: Magnesium: 1.8 mg/dL (ref 1.5–2.5)

## 2018-05-01 LAB — TACROLIMUS,HIGHLY SENSITIVE,LC/MS/MS: Tacrolimus Lvl: 4.6 mcg/L — ABNORMAL LOW

## 2018-05-02 ENCOUNTER — Telehealth: Payer: Self-pay | Admitting: Family Medicine

## 2018-05-02 NOTE — Telephone Encounter (Signed)
Thanks Alicia [name]- sorry you were bothered!  We do these labs for her every couple of weeks for [facility]

## 2018-05-02 NOTE — Telephone Encounter (Signed)
Called from [facility] regarding Tacrolimus level of 4.6 (low)  Looks like this was ordered by a doctor at [facility] for liver transplant patient   I called her -she feels fine and her [facility] hepatologist will be able to see labs on care everywhere

## 2018-05-07 ENCOUNTER — Other Ambulatory Visit: Payer: Self-pay | Admitting: Family Medicine

## 2018-05-07 DIAGNOSIS — M792 Neuralgia and neuritis, unspecified: Secondary | ICD-10-CM

## 2018-05-13 ENCOUNTER — Encounter: Payer: Self-pay | Admitting: Family Medicine

## 2018-05-13 ENCOUNTER — Other Ambulatory Visit: Payer: Self-pay | Admitting: Family Medicine

## 2018-05-13 ENCOUNTER — Ambulatory Visit (INDEPENDENT_AMBULATORY_CARE_PROVIDER_SITE_OTHER): Payer: BLUE CROSS/BLUE SHIELD | Admitting: Family Medicine

## 2018-05-13 VITALS — BP 130/78 | HR 104 | Temp 99.0°F | Ht 62.0 in | Wt 136.1 lb

## 2018-05-13 DIAGNOSIS — Z1231 Encounter for screening mammogram for malignant neoplasm of breast: Secondary | ICD-10-CM

## 2018-05-13 DIAGNOSIS — M25531 Pain in right wrist: Secondary | ICD-10-CM | POA: Diagnosis not present

## 2018-05-13 MED ORDER — TRAMADOL HCL 50 MG PO TABS: 50.0000 mg | ORAL_TABLET | Freq: Three times a day (TID) | ORAL | 0 refills | Status: DC | PRN

## 2018-05-13 NOTE — Progress Notes (Signed)
Musculoskeletal Exam  Patient: Alicia [name] DOB: Mar 08, 1961  DOS: 05/13/2018  SUBJECTIVE:  Chief Complaint:   Chief Complaint  Patient presents with  . Wrist Pain    right    Alicia [name] is a 57 y.o.  female for evaluation and treatment of R wrist pain.   Onset:  2 weeks ago.  No inj or change in activity.  Location: Dorsal-ulnar side Character:  aching  Progression of issue:  is unchanged Associated symptoms: Decreased ROM and difficulty w grip strength Treatment: to date has been ice.   Neurovascular symptoms: no  ROS: Musculoskeletal/Extremities: +R wrist pain  Past Medical History:  Diagnosis Date  . Allergy   . Anemia   . Asthma   . Cellulitis   . Cirrhosis ([place])   . GERD (gastroesophageal reflux disease)   . Hepatic fibrosis    congenital  . History of UTI    chronic as a child  . Sarcoidosis   . Spleen enlarged     Objective: VITAL SIGNS: BP 130/78 (BP Location: Left Arm, Patient Position: Sitting, Cuff Size: Normal)   Pulse (!) 104   Temp 99 F (37.2 C) (Oral)   Ht 5\' 2"  (1.575 m)   Wt 136 lb 2 oz (61.7 kg)   SpO2 97%   BMI 24.90 kg/m  Constitutional: Well formed, well developed. No acute distress. Cardiovascular: Brisk cap refill Thorax & Lungs: No accessory muscle use Musculoskeletal: R wrist.   Normal active range of motion: no.   Normal passive range of motion: no Tenderness to palpation: yes over ext carpi ulnaris tendon Deformity: no Ecchymosis: no Neurologic: Normal sensory function. No focal deficits noted. Psychiatric: Normal mood. Age appropriate judgment and insight. Alert & oriented x 3.    Assessment:  Right wrist pain - Plan: traMADol (ULTRAM) 50 MG tablet  Plan: Orders as above. Tylenol, unable to take NSAIDs, ice, wrist brace given. Stretches and exercises for the wrist also provided. F/u prn. The patient voiced understanding and agreement to the plan.   [place], DO 05/13/18  11:46 AM

## 2018-05-13 NOTE — Progress Notes (Signed)
Pre visit review using our clinic review tool, if applicable. No additional management support is needed unless otherwise documented below in the visit note. 

## 2018-05-13 NOTE — Patient Instructions (Addendum)
Ice/cold pack over area for 10-15 min twice daily.  OK to take Tylenol 1000 mg (2 extra strength tabs) or 975 mg (3 regular strength tabs) every 6 hours as needed.  Wear brace when you are going to bed or going to use your wrist.   Do not drink alcohol, do any illicit/street drugs, drive or do anything that requires alertness while on this medicine.   Wrist and Forearm Exercises Do exercises exactly as told by your health care provider and adjust them as directed. It is normal to feel mild stretching, pulling, tightness, or discomfort as you do these exercises, but you should stop right away if you feel sudden pain or your pain gets worse.   RANGE OF MOTION EXERCISES These exercises warm up your muscles and joints and improve the movement and flexibility of your injured wrist and forearm. These exercises also help to relieve pain, numbness, and tingling. These exercises are done using the muscles in your injured wrist and forearm. Exercise A: Wrist Flexion, Active 1. With your fingers relaxed, bend your wrist forward as far as you can. 2. Hold this position for 30 seconds. Repeat 2 times. Complete this exercise 3 times per week. Exercise B: Wrist Extension, Active 1. With your fingers relaxed, bend your wrist backward as far as you can. 2. Hold this position for 30 seconds. Repeat 2 times. Complete this exercise 3 times per week. Exercise C: Supination, Active  1. Stand or sit with your arms at your sides. 2. Bend your left / right elbow to an "L" shape (90 degrees). 3. Turn your palm upward until you feel a gentle stretch on the inside of your forearm. 4. Hold this position for 30 seconds. 5. Slowly return your palm to the starting position. Repeat 2 times. Complete this exercise 3 times per week. Exercise D: Pronation, Active  1. Stand or sit with your arms at your sides. 2. Bend your left / right elbow to an "L" shape (90 degrees). 3. Turn your palm downward until you feel a  gentle stretch on the top of your forearm. 4. Hold this position for 30 seconds. 5. Slowly return your palm to the starting position. Repeat 2 times. Complete this exercise once a day.  STRETCHING EXERCISES These exercises warm up your muscles and joints and improve the movement and flexibility of your injured wrist and forearm. These exercises also help to relieve pain, numbness, and tingling. These exercises are done using your healthy wrist and forearm to help stretch the muscles in your injured wrist and forearm. Exercise E: Wrist Flexion, Passive  1. Extend your left / right arm in front of you, relax your wrist, and point your fingers downward. 2. Gently push on the back of your hand. Stop when you feel a gentle stretch on the top of your forearm. 3. Hold this position for 30 seconds. Repeat 2 times. Complete this exercise 3 times per week. Exercise F: Wrist Extension, Passive  1. Extend your left / right arm in front of you and turn your palm upward. 2. Gently pull your palm and fingertips back so your fingers point downward. You should feel a gentle stretch on the palm-side of your forearm. 3. Hold this position for 30 seconds. Repeat 2 times. Complete this exercise 3 times per week. Exercise G: Forearm Rotation, Supination, Passive 1. Sit with your left / right elbow bent to an "L" shape (90 degrees) with your forearm resting on a table. 2. Keeping your upper body and  shoulder still, use your other hand to rotate your forearm palm-up until you feel a gentle to moderate stretch. 3. Hold this position for 30 seconds. 4. Slowly release the stretch and return to the starting position. Repeat 2 times. Complete this exercise 3 times per week. Exercise H: Forearm Rotation, Pronation, Passive 1. Sit with your left / right elbow bent to an "L" shape (90 degrees) with your forearm resting on a table. 2. Keeping your upper body and shoulder still, use your other hand to rotate your forearm  palm-down until you feel a gentle to moderate stretch. 3. Hold this position for 30 seconds. 4. Slowly release the stretch and return to the starting position. Repeat 2 times. Complete this exercise 3 times per week.  STRENGTHENING EXERCISES These exercises build strength and endurance in your wrist and forearm. Endurance is the ability to use your muscles for a long time, even after they get tired. Exercise I: Wrist Flexors  1. Sit with your left / right forearm supported on a table and your hand resting palm-up over the edge of the table. Your elbow should be bent to an "L" shape (about 90 degrees) and be below the level of your shoulder. 2. Hold a 3-5 lb weight in your left / right hand. Or, hold a rubber exercise band or tube in both hands, keeping your hands at the same level and hip distance apart. There should be a slight tension in the exercise band or tube. 3. Slowly curl your hand up toward your forearm. 4. Hold this position for 3 seconds. 5. Slowly lower your hand back to the starting position. Repeat 2 times. Complete this exercise 3 times per week. Exercise J: Wrist Extensors  1. Sit with your left / right forearm supported on a table and your hand resting palm-down over the edge of the table. Your elbow should be bent to an "L" shape (about 90 degrees) and be below the level of your shoulder. 2. Hold a 3-5 lb weight in your left / right hand. Or, hold a rubber exercise band or tube in both hands, keeping your hands at the same level and hip distance apart. There should be a slight tension in the exercise band or tube. 3. Slowly curl your hand up toward your forearm. 4. Hold this position for 3 seconds. 5. Slowly lower your hand back to the starting position. Repeat 2 times. Complete this exercise 3 times per week. Exercise K: Forearm Rotation, Supination  1. Sit with your left / right forearm supported on a table and your hand resting palm-down. Your elbow should be at your  side, bent to an "L" shape (about 90 degrees), and below the level of your shoulder. Keep your wrist stable and in a neutral position throughout the exercise. 2. Gently hold a lightweight hammer with your left / right hand. 3. Without moving your elbow or wrist, slowly rotate your palm upward to a thumbs-up position. 4. Hold this position for 3 seconds. 5. Slowly return your forearm to the starting position. Repeat 2 times. Complete this exercise 3 times per week. Exercise L: Forearm Rotation, Pronation  1. Sit with your left / right forearm supported on a table and your hand resting palm-up. Your elbow should be at your side, bent to an "L" shape (about 90 degrees), and below the level of your shoulder. Keep your wrist stable. Do not allow it to move backward or forward during the exercise. 2. Gently hold a lightweight hammer with  your left / right hand. 3. Without moving your elbow or wrist, slowly rotate your palm and hand upward to a thumbs-up position. 4. Hold this position for 3 seconds. 5. Slowly return your forearm to the starting position. Repeat 2 times. Complete this exercise 3 times per week. Exercise M: Grip Strengthening  1. Hold one of these items in your left / right hand: play dough, therapy putty, a dense sponge, a stress ball, or a large, rolled sock. 2. Squeeze as hard as you can without increasing pain. 3. Hold this position for 5 seconds. 4. Slowly release your grip. Repeat 2 times. Complete this exercise 3 times per week.  This information is not intended to replace advice given to you by your health care provider. Make sure you discuss any questions you have with your health care provider. Document Released: 07/31/2005 Document Revised: 06/10/2016 Document Reviewed: 06/11/2015 Elsevier Interactive Patient Education  [employer].

## 2018-05-21 ENCOUNTER — Ambulatory Visit (HOSPITAL_BASED_OUTPATIENT_CLINIC_OR_DEPARTMENT_OTHER)
Admission: RE | Admit: 2018-05-21 | Discharge: 2018-05-21 | Disposition: A | Discharge status: Home / Self Care | Payer: BLUE CROSS/BLUE SHIELD | Source: Ambulatory Visit | Attending: Family Medicine | Admitting: Family Medicine

## 2018-05-21 DIAGNOSIS — Z1231 Encounter for screening mammogram for malignant neoplasm of breast: Secondary | ICD-10-CM

## 2018-05-21 DIAGNOSIS — E2839 Other primary ovarian failure: Secondary | ICD-10-CM | POA: Diagnosis present

## 2018-05-25 ENCOUNTER — Ambulatory Visit (INDEPENDENT_AMBULATORY_CARE_PROVIDER_SITE_OTHER): Payer: BLUE CROSS/BLUE SHIELD | Admitting: Family Medicine

## 2018-05-25 ENCOUNTER — Ambulatory Visit (HOSPITAL_BASED_OUTPATIENT_CLINIC_OR_DEPARTMENT_OTHER)
Admission: RE | Admit: 2018-05-25 | Discharge: 2018-05-25 | Disposition: A | Discharge status: Home / Self Care | Payer: BLUE CROSS/BLUE SHIELD | Source: Ambulatory Visit | Attending: Family Medicine | Admitting: Family Medicine

## 2018-05-25 ENCOUNTER — Encounter: Payer: Self-pay | Admitting: Family Medicine

## 2018-05-25 VITALS — BP 122/80 | HR 98 | Resp 16 | Ht 62.0 in | Wt 134.0 lb

## 2018-05-25 DIAGNOSIS — S66911A Strain of unspecified muscle, fascia and tendon at wrist and hand level, right hand, initial encounter: Secondary | ICD-10-CM | POA: Diagnosis not present

## 2018-05-25 DIAGNOSIS — M810 Age-related osteoporosis without current pathological fracture: Secondary | ICD-10-CM | POA: Insufficient documentation

## 2018-05-25 DIAGNOSIS — M25531 Pain in right wrist: Secondary | ICD-10-CM | POA: Diagnosis not present

## 2018-05-25 DIAGNOSIS — X58XXXA Exposure to other specified factors, initial encounter: Secondary | ICD-10-CM | POA: Insufficient documentation

## 2018-05-25 NOTE — Progress Notes (Signed)
[place] at [facility] [employer] [address], [place],  [address] [phone]  Date:  05/25/2018   Name:  Alicia [name]   DOB:  [birth year]   MRN:  [id number]  PCP:  Darreld Mclean, MD    Chief Complaint: Wrist Pain (saw dr. Nani Ravens 2 weeks, no better, wearing wrist brace, no xray)   History of Present Illness:  Alicia [name] is a 57 y.o. very pleasant female patient who presents with the following:  Last seen by myself a month ago-  With nerve pain around her recent liver transplant surgical sites- the numbness and pain are doing better which is great news.  She is able to sleep better because she has less pain  She was then in and saw Dr. Nani Ravens on 8/14-wrist pain which seemed to be perhaps an inflamed tendon, she was put into a wrist brace  Alicia [name] notes that she just woke up one day with ulnar aspect right wrist pain.  She is not aware of any injury or overuse She is wearing a brace that we had on hand., but it is really not the right size for her The pain has now been present for about one month, not getting better It bothers her to use her hand as in eating, so she may use utensils with her left hand Ow no joint issues She is generally doing great and is grateful for her new lease on life   Patient Active Problem List   Diagnosis Date Noted  . Status post liver transplant ([place]) 12/25/2017  . Syncope 07/17/2017  . Other cirrhosis of liver ([place]) 05/10/2017  . Immunocompromised patient ([place]) 05/10/2017  . Mucous cyst of digit of left hand 04/11/2017  . Cellulitis, abdominal wall 03/01/2017  . Normocytic anemia 03/01/2017  . Hypokalemia 03/01/2017  . CKD (chronic kidney disease), stage III ([place]) 03/01/2017  . Abdominal wall cellulitis 03/01/2017  . Adrenal insufficiency ([place]) 03/01/2017  . Hyperammonemia ([place])   . Cellulitis 06/28/2016  . Thrombocytopenia ([place]) 06/28/2016  . AKI (acute kidney injury) ([place])  06/28/2016  . Multiple pulmonary nodules determined by computed tomography of lung   . Chronic cough 09/07/2015  . Hypertension, portal ([place]) 09/04/2011  . Asthma with chronic obstructive pulmonary disease (COPD) ([place]) 09/04/2011  . 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 GERD (gastroesophageal reflux disease) 07/04/2011  . Sarcoidosis 07/01/2011  . Asthma, severe persistent, with upper airway instability 06/13/2011  . 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 Allergy   . Anemia   . Asthma   . Cellulitis   . Cirrhosis ([place])   . GERD (gastroesophageal reflux disease)   . Hepatic fibrosis    congenital  . History of UTI    chronic as a child  . Sarcoidosis   . Spleen enlarged     Past Surgical History:  Procedure Laterality Date  . COLONOSCOPY    . CYST EXCISION Left 05/01/2017   Procedure: LEFT INDEX FINGER CYST EXCISION;  Surgeon: Leandrew Koyanagi, MD;  Location: [place];  Service: Orthopedics;  Laterality: Left;  . ESOPHAGOGASTRODUODENOSCOPY    . SPINAL FUSION  12/2007   Double spinal Fusion  . VIDEO BRONCHOSCOPY Bilateral 12/08/2015   Procedure: VIDEO BRONCHOSCOPY WITH FLUORO;  Surgeon: Rigoberto Noel, MD;  Location: [facility];  Service: Cardiopulmonary;  Laterality: Bilateral;    Social History   Tobacco Use  . Smoking status: Never Smoker  .  Smokeless tobacco: Never Used  Substance Use Topics  . Alcohol use: No  . Drug use: No    Family History  Problem Relation Age of Onset  . Arthritis Mother   . Heart disease Father        Triple bypass  . Migraines Father   . Heart attack Paternal Grandmother   . Stroke Paternal Grandfather   . Liver disease Sister        Juvenile cysitc liver disease  . Asthma Sister     Allergies  Allergen Reactions  . Aspirin Other (See Comments)    Causes Asthma  . Peanut-Containing Drug Products Shortness Of Breath    Asthma, eye swelling, itchy eyes, can't catch her breath  . Gabapentin  Other (See Comments)    Blisters in mouth, nausea   . Azithromycin Nausea And Vomiting    Medication list has been reviewed and updated.  Current Outpatient Medications on File Prior to Visit  Medication Sig Dispense Refill  . albuterol (PROVENTIL HFA;VENTOLIN HFA) 108 (90 Base) MCG/ACT inhaler Inhale 1-2 puffs into the lungs every 6 (six) hours as needed for wheezing or shortness of breath. 1 Inhaler 0  . amitriptyline (ELAVIL) 25 MG tablet TAKE 1 OR 2 TABLETS BY MOUTH AT BEDTIME FOR NERVE PAIN 180 tablet 2  . bumetanide (BUMEX) 1 MG tablet Take 1 mg by mouth daily.    . calcium citrate-vitamin D (CITRACAL+D) 315-200 MG-UNIT tablet Take 1 tablet by mouth 2 (two) times daily.    . DULERA 200-5 MCG/ACT AERO INHALE 2 PUFFS BY MOUTH TWO TIMES DAILY. 13 Inhaler 0  . ipratropium-albuterol (DUONEB) 0.5-2.5 (3) MG/3ML SOLN Take 3 mLs by nebulization every 4 (four) hours as needed. 360 mL 0  . mycophenolate (CELLCEPT) 250 MG capsule Take by mouth 2 (two) times daily.    . pantoprazole (PROTONIX) 40 MG tablet Take 40 mg by mouth daily.    . predniSONE (DELTASONE) 5 MG tablet Take 1 tablet (5 mg total) by mouth daily with breakfast.    . SPIRIVA RESPIMAT 2.5 MCG/ACT AERS Inhale 2 puffs into the lungs daily at 2 PM.   11  . tacrolimus (PROGRAF) 1 MG capsule Take 1 mg by mouth 5 (five) times daily. TAKE 2 TABLETS (2MG  TOTAL) IN THE MORNING AND THREE TABLETS (3MG  TOTAL) IN THE EVENING.    . traMADol (ULTRAM) 50 MG tablet Take 1 tablet (50 mg total) by mouth every 8 (eight) hours as needed. 12 tablet 0  . dipyridamole (PERSANTINE) 50 MG tablet Take 50 mg by mouth daily.    . magnesium oxide (MAG-OX) 400 MG tablet Take 400 mg by mouth 2 (two) times daily.     No current facility-administered medications on file prior to visit.     Review of Systems:  As per HPI- otherwise negative. No fever or chills    Physical Examination: Vitals:   05/25/18 1539  BP: 122/80  Pulse: 98  Resp: 16  SpO2: 98%    Vitals:   05/25/18 1539  Weight: 134 lb (60.8 kg)  Height: 5\' 2"  (1.575 m)   Body mass index is 24.51 kg/m. Ideal Body Weight: Weight in (lb) to have BMI = 25: 136.4  GEN: WDWN, NAD, Non-toxic, A & O x 3, continues to look better every time I see her since her recent liver transplant  HEENT: Atraumatic, Normocephalic. Neck supple. No masses, No LAD. Ears and Nose: No external deformity. CV: RRR, No M/G/R. No JVD. No thrill. No  extra heart sounds. PULM: CTA B, no wheezes, crackles, rhonchi. No retractions. No resp. distress. No accessory muscle use. EXTR: No c/c/e NEURO Normal gait.  PSYCH: Normally interactive. Conversant. Not depressed or anxious appearing.  Calm demeanor.  Right wrist- she is tender over the distal ulnar aspect of the wrist, likely over the flexor carpi ulnaris tendon She has pain with wrist flexion, extension ,and radial/ ulnar deviation No redness, heat or swelling Grip strength is decreased due to pain    Assessment and Plan: Strain of right wrist, initial encounter - Plan: DG Wrist Complete Right, Ambulatory referral to Hand Surgery  Osteoporosis without current pathological fracture, unspecified osteoporosis type  Wrist strain and pain  Will obtain plain films of her wrist today Unfortunately we do not have a pre-fab wrist brace that is really appropriate for her. She will [facility] for one at [facility] and I will refer to hand.  She may need a steroid injection  Also went over recent dexa scan- she does have osteoporosis likely secondary to long term use of systemic steroids over the last few years.  She will discuss with her endocrinologist and also with her hepatologist.  If approved I am glad to rx a bisphosphonate for her     Signed Lamar Blinks, MD

## 2018-05-25 NOTE — Patient Instructions (Signed)
I will set you up to see one of the hand doctors asap regarding your right wrist pain We will also get an x-ray for your today Perhaps get a smaller wrist brace OTC for the meantime I gave you a copy of your bone density report today- I would like to start treating you for this assuming it is ok with your hepatologist

## 2018-05-26 ENCOUNTER — Telehealth: Payer: Self-pay | Admitting: *Deleted

## 2018-05-26 ENCOUNTER — Other Ambulatory Visit (INDEPENDENT_AMBULATORY_CARE_PROVIDER_SITE_OTHER): Payer: BLUE CROSS/BLUE SHIELD

## 2018-05-26 DIAGNOSIS — Z944 Liver transplant status: Secondary | ICD-10-CM

## 2018-05-26 LAB — CBC WITH DIFFERENTIAL/PLATELET
BASOS PCT: 1.6 % (ref 0.0–3.0)
Basophils Absolute: 0 10*3/uL (ref 0.0–0.1)
EOS PCT: 3.7 % (ref 0.0–5.0)
Eosinophils Absolute: 0.1 10*3/uL (ref 0.0–0.7)
HCT: 29.1 % — ABNORMAL LOW (ref 36.0–46.0)
Hemoglobin: 10.3 g/dL — ABNORMAL LOW (ref 12.0–15.0)
LYMPHS ABS: 0.7 10*3/uL (ref 0.7–4.0)
Lymphocytes Relative: 43.7 % (ref 12.0–46.0)
MCHC: 35.4 g/dL (ref 30.0–36.0)
MCV: 86.8 fl (ref 78.0–100.0)
MONO ABS: 0.4 10*3/uL (ref 0.1–1.0)
Monocytes Relative: 25.4 % — ABNORMAL HIGH (ref 3.0–12.0)
NEUTROS ABS: 0.4 10*3/uL — AB (ref 1.4–7.7)
NEUTROS PCT: 25.6 % — AB (ref 43.0–77.0)
Platelets: 153 10*3/uL (ref 150.0–400.0)
RBC: 3.36 Mil/uL — ABNORMAL LOW (ref 3.87–5.11)
RDW: 14.8 % (ref 11.5–15.5)
WBC: 1.6 10*3/uL — CL (ref 4.0–10.5)

## 2018-05-26 LAB — COMPREHENSIVE METABOLIC PANEL
ALT: 13 U/L (ref 0–35)
AST: 11 U/L (ref 0–37)
Albumin: 4.3 g/dL (ref 3.5–5.2)
Alkaline Phosphatase: 66 U/L (ref 39–117)
BUN: 22 mg/dL (ref 6–23)
CHLORIDE: 107 meq/L (ref 96–112)
CO2: 27 mEq/L (ref 19–32)
Calcium: 9.3 mg/dL (ref 8.4–10.5)
Creatinine, Ser: 1.55 mg/dL — ABNORMAL HIGH (ref 0.40–1.20)
GFR: 36.31 mL/min — ABNORMAL LOW (ref >60.00)
Glucose, Bld: 118 mg/dL — ABNORMAL HIGH (ref 70–99)
POTASSIUM: 4.1 meq/L (ref 3.5–5.1)
SODIUM: 141 meq/L (ref 135–145)
Total Bilirubin: 0.7 mg/dL (ref 0.2–1.2)
Total Protein: 6 g/dL (ref 6.0–8.3)

## 2018-05-26 LAB — MAGNESIUM: MAGNESIUM: 1.9 mg/dL (ref 1.5–2.5)

## 2018-05-26 NOTE — Progress Notes (Signed)
Called and spoke with Alicia [name], her RN transplant coordinator.  She asked me to order a CMV and will contact Sharonlee to discuss and will ask her to come in for CMV test.

## 2018-05-26 NOTE — Telephone Encounter (Signed)
CRITICAL VALUE STICKER  CRITICAL VALUE:WBC:1.6  RECEIVER (on-site recipient of call):Angie  DATE & TIME NOTIFIED:05/26/18 @ 12:24pm  MESSENGER (representative from lab):Karen-Elam  MD NOTIFIED:Dr. Carollee Herter (DOD)  TIME OF NOTIFICATION:12:46pm  RESPONSE:Send message with the results to the provider.//AB/CMA

## 2018-05-26 NOTE — Telephone Encounter (Signed)
Please call her doctor at [facility] and send them to [facility] ASAP

## 2018-05-26 NOTE — Telephone Encounter (Signed)
Dr. Lorelei Pont made pt. Aware per lab comment 8/27 at 1:26PM, and is contacting transplant team. Lab values faxed to [facility] at [phone] per Dr. Nonda Lou request.

## 2018-05-26 NOTE — Addendum Note (Signed)
Addended by: Lamar Blinks C on: 05/26/2018 01:25 PM   Modules accepted: Orders

## 2018-05-27 ENCOUNTER — Other Ambulatory Visit (INDEPENDENT_AMBULATORY_CARE_PROVIDER_SITE_OTHER): Payer: BLUE CROSS/BLUE SHIELD

## 2018-05-27 DIAGNOSIS — Z944 Liver transplant status: Secondary | ICD-10-CM | POA: Diagnosis not present

## 2018-05-27 LAB — TACROLIMUS LEVEL: Tacrolimus (FK506), Blood: 4.6 ng/mL — ABNORMAL LOW

## 2018-06-02 ENCOUNTER — Other Ambulatory Visit (INDEPENDENT_AMBULATORY_CARE_PROVIDER_SITE_OTHER): Payer: BLUE CROSS/BLUE SHIELD

## 2018-06-02 DIAGNOSIS — Z944 Liver transplant status: Secondary | ICD-10-CM | POA: Diagnosis not present

## 2018-06-02 LAB — COMPREHENSIVE METABOLIC PANEL
ALBUMIN: 4 g/dL (ref 3.5–5.2)
ALT: 8 U/L (ref 0–35)
AST: 7 U/L (ref 0–37)
Alkaline Phosphatase: 68 U/L (ref 39–117)
BUN: 23 mg/dL (ref 6–23)
CHLORIDE: 105 meq/L (ref 96–112)
CO2: 28 meq/L (ref 19–32)
CREATININE: 1.59 mg/dL — AB (ref 0.40–1.20)
Calcium: 8.9 mg/dL (ref 8.4–10.5)
GFR: 35.25 mL/min — ABNORMAL LOW (ref >60.00)
GLUCOSE: 110 mg/dL — AB (ref 70–99)
Potassium: 4.4 mEq/L (ref 3.5–5.1)
SODIUM: 141 meq/L (ref 135–145)
Total Bilirubin: 0.4 mg/dL (ref 0.2–1.2)
Total Protein: 5.7 g/dL — ABNORMAL LOW (ref 6.0–8.3)

## 2018-06-02 LAB — MAGNESIUM: Magnesium: 1.8 mg/dL (ref 1.5–2.5)

## 2018-06-02 LAB — CBC
HEMATOCRIT: 29.9 % — AB (ref 36.0–46.0)
Hemoglobin: 10.4 g/dL — ABNORMAL LOW (ref 12.0–15.0)
MCHC: 34.8 g/dL (ref 30.0–36.0)
MCV: 86.5 fl (ref 78.0–100.0)
Platelets: 161 10*3/uL (ref 150.0–400.0)
RBC: 3.46 Mil/uL — ABNORMAL LOW (ref 3.87–5.11)
RDW: 14.8 % (ref 11.5–15.5)
WBC: 2.2 10*3/uL — ABNORMAL LOW (ref 4.0–10.5)

## 2018-06-02 NOTE — Addendum Note (Signed)
Addended by: Harl Bowie on: 06/02/2018 08:58 AM   Modules accepted: Orders

## 2018-06-03 ENCOUNTER — Telehealth: Payer: Self-pay | Admitting: Family Medicine

## 2018-06-03 ENCOUNTER — Other Ambulatory Visit: Payer: Self-pay | Admitting: Family Medicine

## 2018-06-03 ENCOUNTER — Other Ambulatory Visit: Payer: Self-pay | Admitting: Orthopedic Surgery

## 2018-06-03 DIAGNOSIS — Z944 Liver transplant status: Secondary | ICD-10-CM

## 2018-06-03 DIAGNOSIS — S63091A Other subluxation of right wrist and hand, initial encounter: Secondary | ICD-10-CM

## 2018-06-03 NOTE — Telephone Encounter (Signed)
Angie is this something we can add?

## 2018-06-03 NOTE — Telephone Encounter (Signed)
Copied from [place] [id number]. Topic: Quick Communication - See Telephone Encounter >> Jun 03, 2018  9:39 AM Bea Graff, NT wrote: CRM for notification. See Telephone encounter for: 06/03/18. Collie Siad patients transplant coordinatior at [facility] is calling to see if a differential can be added to the CBC the pt had drawn yesterday as well as get the result for the CMV. CB#: [phone]

## 2018-06-04 LAB — TACROLIMUS,HIGHLY SENSITIVE,LC/MS/MS: TACROLIMUS LVL: 3.5 ug/L — AB

## 2018-06-05 ENCOUNTER — Telehealth: Payer: Self-pay | Admitting: Family Medicine

## 2018-06-05 LAB — CMV DNA BY PCR, QUALITATIVE

## 2018-06-05 NOTE — Telephone Encounter (Signed)
We were not able to add the differential to the recent blood work.//AB/CMA

## 2018-06-05 NOTE — Telephone Encounter (Signed)
Lelan Pons, Client Service Representative with [employer] called a lab report of Tacrolimus Highly Sensitive at 3.5 Low, result read back and verified, result faxed to office by Lelan Pons.

## 2018-06-05 NOTE — Telephone Encounter (Signed)
Called and left detailed message for her transplant coordinator Manuela Schwartz at [facility] tacrolimus level is low Also her CMV sample did not get run for some reason- the lab says it was not received.  Her leukopenia is improved- do we still need the CMV?  If so I can have the pt recollect Diff was also not run this time  Let her my cell number to call me back with any concerns

## 2018-06-08 NOTE — Telephone Encounter (Signed)
Patient had cbc done on 06/02/18. Please advise if I need to fax results to [place]?

## 2018-06-08 NOTE — Telephone Encounter (Signed)
I got a call from Collie Siad, Therapist, sports, her transplant coordinator at [facility]- she left a message on my cell requesting that Nyrah have a CBC, CMP, CMV pcr and prograf level asap. Ordered labs for Carlei and called her, she will come in tomorrow for her labs

## 2018-06-09 ENCOUNTER — Other Ambulatory Visit (INDEPENDENT_AMBULATORY_CARE_PROVIDER_SITE_OTHER): Payer: BLUE CROSS/BLUE SHIELD

## 2018-06-09 DIAGNOSIS — Z944 Liver transplant status: Secondary | ICD-10-CM | POA: Diagnosis not present

## 2018-06-09 LAB — CBC WITH DIFFERENTIAL/PLATELET
BASOS ABS: 0 10*3/uL (ref 0.0–0.1)
Basophils Relative: 1.4 % (ref 0.0–3.0)
EOS PCT: 1.7 % (ref 0.0–5.0)
Eosinophils Absolute: 0.1 10*3/uL (ref 0.0–0.7)
HCT: 32.3 % — ABNORMAL LOW (ref 36.0–46.0)
HEMOGLOBIN: 11.3 g/dL — AB (ref 12.0–15.0)
Lymphocytes Relative: 34.7 % (ref 12.0–46.0)
Lymphs Abs: 1.2 10*3/uL (ref 0.7–4.0)
MCHC: 35 g/dL (ref 30.0–36.0)
MCV: 85.9 fl (ref 78.0–100.0)
MONOS PCT: 9 % (ref 3.0–12.0)
Monocytes Absolute: 0.3 10*3/uL (ref 0.1–1.0)
NEUTROS PCT: 53.2 % (ref 43.0–77.0)
Neutro Abs: 1.8 10*3/uL (ref 1.4–7.7)
Platelets: 163 10*3/uL (ref 150.0–400.0)
RBC: 3.76 Mil/uL — AB (ref 3.87–5.11)
RDW: 13.9 % (ref 11.5–15.5)
WBC: 3.3 10*3/uL — AB (ref 4.0–10.5)

## 2018-06-09 LAB — COMPREHENSIVE METABOLIC PANEL
ALBUMIN: 4.1 g/dL (ref 3.5–5.2)
ALK PHOS: 76 U/L (ref 39–117)
ALT: 9 U/L (ref 0–35)
AST: 10 U/L (ref 0–37)
BUN: 28 mg/dL — ABNORMAL HIGH (ref 6–23)
CO2: 28 mEq/L (ref 19–32)
Calcium: 9 mg/dL (ref 8.4–10.5)
Chloride: 106 mEq/L (ref 96–112)
Creatinine, Ser: 1.57 mg/dL — ABNORMAL HIGH (ref 0.40–1.20)
GFR: 35.77 mL/min — AB (ref >60.00)
Glucose, Bld: 112 mg/dL — ABNORMAL HIGH (ref 70–99)
POTASSIUM: 4.2 meq/L (ref 3.5–5.1)
Sodium: 140 mEq/L (ref 135–145)
TOTAL PROTEIN: 5.9 g/dL — AB (ref 6.0–8.3)
Total Bilirubin: 0.6 mg/dL (ref 0.2–1.2)

## 2018-06-09 LAB — MAGNESIUM: Magnesium: 2 mg/dL (ref 1.5–2.5)

## 2018-06-09 NOTE — Addendum Note (Signed)
Addended by: Caffie Pinto on: 06/09/2018 09:56 AM   Modules accepted: Orders

## 2018-06-10 LAB — TACROLIMUS LEVEL: TACROLIMUS LVL: 3.4 ng/mL — AB

## 2018-06-12 ENCOUNTER — Encounter: Payer: Self-pay | Admitting: Family Medicine

## 2018-06-12 DIAGNOSIS — Z944 Liver transplant status: Secondary | ICD-10-CM

## 2018-06-12 LAB — CMV DNA, QUANTITATIVE, PCR: CMV DNA QUANT: NEGATIVE [IU]/mL

## 2018-06-16 ENCOUNTER — Other Ambulatory Visit (INDEPENDENT_AMBULATORY_CARE_PROVIDER_SITE_OTHER): Payer: BLUE CROSS/BLUE SHIELD

## 2018-06-16 DIAGNOSIS — Z944 Liver transplant status: Secondary | ICD-10-CM

## 2018-06-16 LAB — CBC WITH DIFFERENTIAL/PLATELET
BASOS PCT: 1.3 % (ref 0.0–3.0)
Basophils Absolute: 0 10*3/uL (ref 0.0–0.1)
Eosinophils Absolute: 0.1 10*3/uL (ref 0.0–0.7)
Eosinophils Relative: 2.1 % (ref 0.0–5.0)
HCT: 31.7 % — ABNORMAL LOW (ref 36.0–46.0)
Hemoglobin: 11.2 g/dL — ABNORMAL LOW (ref 12.0–15.0)
LYMPHS ABS: 1.1 10*3/uL (ref 0.7–4.0)
Lymphocytes Relative: 34.2 % (ref 12.0–46.0)
MCHC: 35.3 g/dL (ref 30.0–36.0)
MCV: 85.9 fl (ref 78.0–100.0)
MONO ABS: 0.4 10*3/uL (ref 0.1–1.0)
Monocytes Relative: 13.4 % — ABNORMAL HIGH (ref 3.0–12.0)
NEUTROS PCT: 49 % (ref 43.0–77.0)
Neutro Abs: 1.6 10*3/uL (ref 1.4–7.7)
Platelets: 156 10*3/uL (ref 150.0–400.0)
RBC: 3.69 Mil/uL — AB (ref 3.87–5.11)
RDW: 14.4 % (ref 11.5–15.5)
WBC: 3.3 10*3/uL — ABNORMAL LOW (ref 4.0–10.5)

## 2018-06-16 LAB — MAGNESIUM: Magnesium: 2.1 mg/dL (ref 1.5–2.5)

## 2018-06-16 LAB — COMPREHENSIVE METABOLIC PANEL
ALT: 6 U/L (ref 0–35)
AST: 9 U/L (ref 0–37)
Albumin: 4.2 g/dL (ref 3.5–5.2)
Alkaline Phosphatase: 81 U/L (ref 39–117)
BUN: 26 mg/dL — ABNORMAL HIGH (ref 6–23)
CHLORIDE: 105 meq/L (ref 96–112)
CO2: 29 mEq/L (ref 19–32)
Calcium: 9.2 mg/dL (ref 8.4–10.5)
Creatinine, Ser: 1.52 mg/dL — ABNORMAL HIGH (ref 0.40–1.20)
GFR: 37.13 mL/min — ABNORMAL LOW (ref >60.00)
GLUCOSE: 100 mg/dL — AB (ref 70–99)
Potassium: 4.4 mEq/L (ref 3.5–5.1)
SODIUM: 142 meq/L (ref 135–145)
Total Bilirubin: 0.6 mg/dL (ref 0.2–1.2)
Total Protein: 5.9 g/dL — ABNORMAL LOW (ref 6.0–8.3)

## 2018-06-16 NOTE — Addendum Note (Signed)
Addended by: Harl Bowie on: 06/16/2018 09:18 AM   Modules accepted: Orders

## 2018-06-17 LAB — TACROLIMUS LEVEL: Tacrolimus (FK506), Blood: 4.3 ng/mL — ABNORMAL LOW

## 2018-06-18 ENCOUNTER — Ambulatory Visit
Admission: RE | Admit: 2018-06-18 | Discharge: 2018-06-18 | Disposition: A | Discharge status: Home / Self Care | Payer: BLUE CROSS/BLUE SHIELD | Source: Ambulatory Visit | Attending: Orthopedic Surgery | Admitting: Orthopedic Surgery

## 2018-06-18 DIAGNOSIS — S63091A Other subluxation of right wrist and hand, initial encounter: Secondary | ICD-10-CM

## 2018-06-18 LAB — CMV DNA BY PCR, QUALITATIVE: CMV DNA, Qual PCR: NEGATIVE

## 2018-06-18 MED ORDER — IOPAMIDOL (ISOVUE-M 200) INJECTION 41%
1.5000 mL | Freq: Once | INTRAMUSCULAR | Status: AC
  Administered 2018-06-18: 1.5 mL via INTRA_ARTICULAR

## 2018-06-24 ENCOUNTER — Encounter: Payer: Self-pay | Admitting: Family Medicine

## 2018-06-24 DIAGNOSIS — Z944 Liver transplant status: Secondary | ICD-10-CM

## 2018-06-24 DIAGNOSIS — M818 Other osteoporosis without current pathological fracture: Secondary | ICD-10-CM

## 2018-07-01 ENCOUNTER — Other Ambulatory Visit (INDEPENDENT_AMBULATORY_CARE_PROVIDER_SITE_OTHER): Payer: BLUE CROSS/BLUE SHIELD

## 2018-07-01 ENCOUNTER — Ambulatory Visit (INDEPENDENT_AMBULATORY_CARE_PROVIDER_SITE_OTHER): Payer: BLUE CROSS/BLUE SHIELD

## 2018-07-01 DIAGNOSIS — Z23 Encounter for immunization: Secondary | ICD-10-CM | POA: Diagnosis not present

## 2018-07-01 DIAGNOSIS — Z944 Liver transplant status: Secondary | ICD-10-CM | POA: Diagnosis not present

## 2018-07-01 NOTE — Addendum Note (Signed)
Addended by: Emi Holes on: 07/01/2018 10:14 AM   Modules accepted: Orders

## 2018-07-04 LAB — FRUCTOSAMINE: FRUCTOSAMINE: 252 umol/L (ref 190–270)

## 2018-07-14 ENCOUNTER — Other Ambulatory Visit: Payer: Self-pay

## 2018-07-14 DIAGNOSIS — Z944 Liver transplant status: Secondary | ICD-10-CM

## 2018-07-16 ENCOUNTER — Telehealth: Payer: Self-pay | Admitting: *Deleted

## 2018-07-16 ENCOUNTER — Other Ambulatory Visit (INDEPENDENT_AMBULATORY_CARE_PROVIDER_SITE_OTHER): Payer: BLUE CROSS/BLUE SHIELD

## 2018-07-16 DIAGNOSIS — Z944 Liver transplant status: Secondary | ICD-10-CM

## 2018-07-16 LAB — COMPREHENSIVE METABOLIC PANEL
ALT: 6 U/L (ref 0–35)
AST: 8 U/L (ref 0–37)
Albumin: 4.2 g/dL (ref 3.5–5.2)
Alkaline Phosphatase: 81 U/L (ref 39–117)
BUN: 26 mg/dL — ABNORMAL HIGH (ref 6–23)
CO2: 27 meq/L (ref 19–32)
Calcium: 9 mg/dL (ref 8.4–10.5)
Chloride: 107 mEq/L (ref 96–112)
Creatinine, Ser: 1.55 mg/dL — ABNORMAL HIGH (ref 0.40–1.20)
GFR: 36.29 mL/min — AB (ref >60.00)
GLUCOSE: 107 mg/dL — AB (ref 70–99)
Potassium: 4.2 mEq/L (ref 3.5–5.1)
Sodium: 141 mEq/L (ref 135–145)
Total Bilirubin: 0.5 mg/dL (ref 0.2–1.2)
Total Protein: 5.9 g/dL — ABNORMAL LOW (ref 6.0–8.3)

## 2018-07-16 LAB — CBC WITH DIFFERENTIAL/PLATELET
Basophils Absolute: 0 10*3/uL (ref 0.0–0.1)
Basophils Relative: 0.9 % (ref 0.0–3.0)
EOS ABS: 0.1 10*3/uL (ref 0.0–0.7)
EOS PCT: 2.3 % (ref 0.0–5.0)
HEMATOCRIT: 30.9 % — AB (ref 36.0–46.0)
HEMOGLOBIN: 10.8 g/dL — AB (ref 12.0–15.0)
LYMPHS PCT: 34.9 % (ref 12.0–46.0)
Lymphs Abs: 1.1 10*3/uL (ref 0.7–4.0)
MCHC: 35 g/dL (ref 30.0–36.0)
MCV: 87.5 fl (ref 78.0–100.0)
MONOS PCT: 11.3 % (ref 3.0–12.0)
Monocytes Absolute: 0.4 10*3/uL (ref 0.1–1.0)
Neutro Abs: 1.6 10*3/uL (ref 1.4–7.7)
Neutrophils Relative %: 50.6 % (ref 43.0–77.0)
Platelets: 146 10*3/uL — ABNORMAL LOW (ref 150.0–400.0)
RBC: 3.53 Mil/uL — ABNORMAL LOW (ref 3.87–5.11)
RDW: 15.6 % — AB (ref 11.5–15.5)
WBC: 3.1 10*3/uL — AB (ref 4.0–10.5)

## 2018-07-16 LAB — TACROLIMUS LEVEL: Tacrolimus (FK506), Blood: 4.7 ng/mL — ABNORMAL LOW

## 2018-07-16 LAB — MAGNESIUM: MAGNESIUM: 2 mg/dL (ref 1.5–2.5)

## 2018-07-16 NOTE — Telephone Encounter (Signed)
Received request from [facility] for Lab Standing Orders weekly [for 24 Occurrences] from 07/14/18 through 01/13/19 for CBC, CMP, Tacrolimus, and Magnesium with Diagnosis codes: Liver replaced by transplant [Z94.4] and Need for prophylactic immunotherapy [Z29.8]; paperwork also contains Account # for [facility] and [facility]. Results are to be faxed to [facility] at [phone]; forwarded to provider/SLS 10/17

## 2018-07-16 NOTE — Telephone Encounter (Signed)
Received all results except Tacrolimus. Waiting on this result then will fax info to [facility].

## 2018-07-17 NOTE — Telephone Encounter (Signed)
Results have been faxed to [facility]. 

## 2018-07-18 ENCOUNTER — Encounter: Payer: Self-pay | Admitting: Family Medicine

## 2018-07-18 ENCOUNTER — Other Ambulatory Visit: Payer: Self-pay | Admitting: Family Medicine

## 2018-07-18 DIAGNOSIS — Z9489 Other transplanted organ and tissue status: Secondary | ICD-10-CM

## 2018-07-18 NOTE — Progress Notes (Signed)
Received instructions from Alicia [name] regarding her lab orders Placed standing orders for CBC, CMP, tacrolimus level and magnesium To be done weekly Fax results to [phone]  [facility] program

## 2018-07-22 ENCOUNTER — Other Ambulatory Visit: Payer: BLUE CROSS/BLUE SHIELD

## 2018-08-04 ENCOUNTER — Other Ambulatory Visit (INDEPENDENT_AMBULATORY_CARE_PROVIDER_SITE_OTHER): Payer: BLUE CROSS/BLUE SHIELD

## 2018-08-04 DIAGNOSIS — Z9489 Other transplanted organ and tissue status: Secondary | ICD-10-CM | POA: Diagnosis not present

## 2018-08-04 LAB — COMPREHENSIVE METABOLIC PANEL
ALK PHOS: 85 U/L (ref 39–117)
ALT: 11 U/L (ref 0–35)
AST: 12 U/L (ref 0–37)
Albumin: 4.2 g/dL (ref 3.5–5.2)
BILIRUBIN TOTAL: 0.5 mg/dL (ref 0.2–1.2)
BUN: 24 mg/dL — ABNORMAL HIGH (ref 6–23)
CALCIUM: 8.8 mg/dL (ref 8.4–10.5)
CO2: 26 mEq/L (ref 19–32)
CREATININE: 1.5 mg/dL — AB (ref 0.40–1.20)
Chloride: 107 mEq/L (ref 96–112)
GFR: 37.68 mL/min — AB (ref >60.00)
GLUCOSE: 102 mg/dL — AB (ref 70–99)
Potassium: 4.2 mEq/L (ref 3.5–5.1)
Sodium: 141 mEq/L (ref 135–145)
TOTAL PROTEIN: 5.7 g/dL — AB (ref 6.0–8.3)

## 2018-08-04 LAB — CBC
HCT: 29.7 % — ABNORMAL LOW (ref 36.0–46.0)
Hemoglobin: 10.6 g/dL — ABNORMAL LOW (ref 12.0–15.0)
MCHC: 35.6 g/dL (ref 30.0–36.0)
MCV: 87.9 fl (ref 78.0–100.0)
PLATELETS: 150 10*3/uL (ref 150.0–400.0)
RBC: 3.38 Mil/uL — ABNORMAL LOW (ref 3.87–5.11)
RDW: 16 % — ABNORMAL HIGH (ref 11.5–15.5)
WBC: 3 10*3/uL — ABNORMAL LOW (ref 4.0–10.5)

## 2018-08-04 LAB — MAGNESIUM: MAGNESIUM: 1.9 mg/dL (ref 1.5–2.5)

## 2018-08-05 LAB — TACROLIMUS LEVEL: Tacrolimus (FK506), Blood: 3 ng/mL — ABNORMAL LOW

## 2018-08-15 ENCOUNTER — Encounter (HOSPITAL_BASED_OUTPATIENT_CLINIC_OR_DEPARTMENT_OTHER): Payer: Self-pay | Admitting: Emergency Medicine

## 2018-08-15 ENCOUNTER — Other Ambulatory Visit: Payer: Self-pay

## 2018-08-15 ENCOUNTER — Emergency Department (HOSPITAL_BASED_OUTPATIENT_CLINIC_OR_DEPARTMENT_OTHER)
Admission: EM | Admit: 2018-08-15 | Discharge: 2018-08-15 | Disposition: A | Discharge status: Home / Self Care | Payer: BLUE CROSS/BLUE SHIELD | Attending: Emergency Medicine | Admitting: Emergency Medicine

## 2018-08-15 DIAGNOSIS — Y92512 Supermarket, store or market as the place of occurrence of the external cause: Secondary | ICD-10-CM | POA: Diagnosis not present

## 2018-08-15 DIAGNOSIS — Y9301 Activity, walking, marching and hiking: Secondary | ICD-10-CM | POA: Diagnosis not present

## 2018-08-15 DIAGNOSIS — Z79899 Other long term (current) drug therapy: Secondary | ICD-10-CM | POA: Diagnosis not present

## 2018-08-15 DIAGNOSIS — W228XXA Striking against or struck by other objects, initial encounter: Secondary | ICD-10-CM | POA: Insufficient documentation

## 2018-08-15 DIAGNOSIS — I129 Hypertensive chronic kidney disease with stage 1 through stage 4 chronic kidney disease, or unspecified chronic kidney disease: Secondary | ICD-10-CM | POA: Diagnosis not present

## 2018-08-15 DIAGNOSIS — N183 Chronic kidney disease, stage 3 (moderate): Secondary | ICD-10-CM | POA: Diagnosis not present

## 2018-08-15 DIAGNOSIS — K766 Portal hypertension: Secondary | ICD-10-CM | POA: Insufficient documentation

## 2018-08-15 DIAGNOSIS — Z91010 Allergy to peanuts: Secondary | ICD-10-CM | POA: Diagnosis not present

## 2018-08-15 DIAGNOSIS — S81812A Laceration without foreign body, left lower leg, initial encounter: Secondary | ICD-10-CM | POA: Diagnosis not present

## 2018-08-15 DIAGNOSIS — J45909 Unspecified asthma, uncomplicated: Secondary | ICD-10-CM | POA: Insufficient documentation

## 2018-08-15 DIAGNOSIS — Y999 Unspecified external cause status: Secondary | ICD-10-CM | POA: Diagnosis not present

## 2018-08-15 NOTE — ED Provider Notes (Signed)
[facility] EMERGENCY DEPARTMENT Provider Note   CSN: [id number] Arrival date & time: 08/15/18  1524     History   Chief Complaint Chief Complaint  Patient presents with  . Laceration    HPI Alicia [name] is a 58 y.o. female.  57 y.o female with PMH of Hepatic fibrosis, GERD, Sarcoidosis presents to the ED s/p left leg injury this evening.Patient was walking down the aisle at a vintage store when she graced her left leg along a cast iron and it scrapped her skin off.Patient reports controlling the bleeding with a gauze. She has not tried any therapy for pain. Patient is currently on prednisone due to her hepatic fibrosis. She also reports just finishing a course of clindamycin due to complications with her condition. She denies any alleviating or exacerbating symptoms. Denies any additional trauma.  Had a tetanus shot last year.     Past Medical History:  Diagnosis Date  . Allergy   . Anemia   . Asthma   . Cellulitis   . Cirrhosis ([place])   . GERD (gastroesophageal reflux disease)   . Hepatic fibrosis    congenital  . History of UTI    chronic as a child  . Sarcoidosis   . Spleen enlarged     Patient Active Problem List   Diagnosis Date Noted  . Osteoporosis without current pathological fracture 05/25/2018  . Status post liver transplant ([place]) 12/25/2017  . Syncope 07/17/2017  . Other cirrhosis of liver ([place]) 05/10/2017  . Immunocompromised patient ([place]) 05/10/2017  . Mucous cyst of digit of left hand 04/11/2017  . Cellulitis, abdominal wall 03/01/2017  . Normocytic anemia 03/01/2017  . Hypokalemia 03/01/2017  . CKD (chronic kidney disease), stage III ([place]) 03/01/2017  . Abdominal wall cellulitis 03/01/2017  . Adrenal insufficiency ([place]) 03/01/2017  . Hyperammonemia ([place])   . Cellulitis 06/28/2016  . Thrombocytopenia ([place]) 06/28/2016  . AKI (acute kidney injury) ([place]) 06/28/2016  . Multiple pulmonary nodules determined by computed tomography of  lung   . Chronic cough 09/07/2015  . Hypertension, portal ([place]) 09/04/2011  . Asthma with chronic obstructive pulmonary disease (COPD) ([place]) 09/04/2011  . 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 GERD (gastroesophageal reflux disease) 07/04/2011  . Sarcoidosis 07/01/2011  . Asthma, severe persistent, with upper airway instability 06/13/2011  . 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 COLONOSCOPY    . CYST EXCISION Left 05/01/2017   Procedure: LEFT INDEX FINGER CYST EXCISION;  Surgeon: Leandrew Koyanagi, MD;  Location: [place];  Service: Orthopedics;  Laterality: Left;  . ESOPHAGOGASTRODUODENOSCOPY    . LIVER TRANSPLANT    . SPINAL FUSION  12/2007   Double spinal Fusion  . VIDEO BRONCHOSCOPY Bilateral 12/08/2015   Procedure: VIDEO BRONCHOSCOPY WITH FLUORO;  Surgeon: Rigoberto Noel, MD;  Location: [place];  Service: Cardiopulmonary;  Laterality: Bilateral;     OB History   None      Home Medications    Prior to Admission medications   Medication Sig Start Date End Date Taking? Authorizing Provider  albuterol (PROVENTIL HFA;VENTOLIN HFA) 108 (90 Base) MCG/ACT inhaler Inhale 1-2 puffs into the lungs every 6 (six) hours as needed for wheezing or shortness of breath. 10/19/16   Palumbo, April, MD  amitriptyline (ELAVIL) 25 MG tablet TAKE 1 OR 2 TABLETS BY MOUTH AT BEDTIME FOR NERVE PAIN 05/07/18   Copland, Gay Filler, MD  bumetanide (BUMEX) 1 MG tablet Take 1 mg by  mouth daily.    [provider]  calcium citrate-vitamin D (CITRACAL+D) 315-200 MG-UNIT tablet Take 1 tablet by mouth 2 (two) times daily.    [provider]  dipyridamole (PERSANTINE) 50 MG tablet Take 50 mg by mouth daily.    [provider]  DULERA 200-5 MCG/ACT AERO INHALE 2 PUFFS BY MOUTH TWO TIMES DAILY. 03/17/17   Rigoberto Noel, MD  ipratropium-albuterol (DUONEB) 0.5-2.5 (3) MG/3ML SOLN Take 3 mLs by nebulization every 4 (four) hours as needed. 06/30/17    Copland, Gay Filler, MD  magnesium oxide (MAG-OX) 400 MG tablet Take 400 mg by mouth 2 (two) times daily.    [provider]  mycophenolate (CELLCEPT) 250 MG capsule Take by mouth 2 (two) times daily.    [provider]  pantoprazole (PROTONIX) 40 MG tablet Take 40 mg by mouth daily.    [provider]  predniSONE (DELTASONE) 5 MG tablet Take 1 tablet (5 mg total) by mouth daily with breakfast. 05/13/18   Nani Ravens, Crosby Oyster, DO  SPIRIVA RESPIMAT 2.5 MCG/ACT AERS Inhale 2 puffs into the lungs daily at 2 PM.  01/27/17   [provider]  tacrolimus (PROGRAF) 1 MG capsule Take 1 mg by mouth 5 (five) times daily. TAKE 2 TABLETS (2MG  TOTAL) IN THE MORNING AND THREE TABLETS (3MG  TOTAL) IN THE EVENING.    [provider]  traMADol (ULTRAM) 50 MG tablet Take 1 tablet (50 mg total) by mouth every 8 (eight) hours as needed. 05/13/18   Shelda Pal, DO    Family History Family History  Problem Relation Age of Onset  . Arthritis Mother   . Heart disease Father        Triple bypass  . Migraines Father   . Heart attack Paternal Grandmother   . Stroke Paternal Grandfather   . Liver disease Sister        Juvenile cysitc liver disease  . Asthma Sister     Social History Social History   Tobacco Use  . Smoking status: Never Smoker  . Smokeless tobacco: Never Used  Substance Use Topics  . Alcohol use: No  . Drug use: No     Allergies   Aspirin; Peanut-containing drug products; Gabapentin; and Azithromycin   Review of Systems Review of Systems  Constitutional: Negative for chills and fever.  HENT: Negative for ear pain and sore throat.   Eyes: Negative for pain and visual disturbance.  Respiratory: Negative for cough and shortness of breath.   Cardiovascular: Negative for chest pain and palpitations.  Gastrointestinal: Negative for abdominal pain and vomiting.  Genitourinary: Negative for dysuria and hematuria.  Musculoskeletal:  Negative for arthralgias and back pain.  Skin: Positive for wound. Negative for color change and rash.  Neurological: Negative for seizures and syncope.  All other systems reviewed and are negative.    Physical Exam Updated Vital Signs BP (!) 143/78 (BP Location: Left Arm)   Pulse 93   Temp 98.3 F (36.8 C) (Oral)   Resp 20   Ht 5\' 2"  (1.575 m)   Wt 63.5 kg   SpO2 100%   BMI 25.61 kg/m   Physical Exam  Constitutional: She is oriented to person, place, and time. She appears well-developed and well-nourished. No distress.  HENT:  Head: Normocephalic and atraumatic.  Mouth/Throat: Oropharynx is clear and moist. No oropharyngeal exudate.  Eyes: Pupils are equal, round, and reactive to light.  Neck: Normal range of motion.  Cardiovascular: Regular rhythm and normal  heart sounds.  Pulmonary/Chest: Effort normal and breath sounds normal. No respiratory distress.  Abdominal: Soft. Bowel sounds are normal. She exhibits no distension. There is no tenderness.  Musculoskeletal: She exhibits no tenderness or deformity.       Right lower leg: She exhibits no edema.       Left lower leg: She exhibits no edema.  Neurological: She is alert and oriented to person, place, and time.  Skin: Skin is warm. There is erythema.  Psychiatric: She has a normal mood and affect.  Nursing note and vitals reviewed.    ED Treatments / Results  Labs (all labs ordered are listed, but only abnormal results are displayed) Labs Reviewed - No data to display  EKG None  Radiology No results found.  Procedures Procedures (including critical care time)  Medications Ordered in ED Medications - No data to display   Initial Impression / Assessment and Plan / ED Course  I have reviewed the triage vital signs and the nursing notes.  Pertinent labs & imaging results that were available during my care of the patient were reviewed by me and considered in my medical decision making (see chart for  details).    Presents after scraping her skin with an iron cast out of the into his shop this afternoon.  Patient did have a tetanus shot last year she is currently up-to-date.  Upon examination there is a superficial skin tear of the left lower leg.  I will apply to adhesive along with Steri-Strips to correct this laceration.  Patient is currently on immunosuppressants and prednisone due to her hepatic fibrosis.  Is to follow-up with her PCP this week or if she continues any redness, swelling to her left leg. No antibiotics indicated at this time. Return precautions provided.   Final Clinical Impressions(s) / ED Diagnoses   Final diagnoses:  Laceration of left lower extremity, initial encounter    ED Discharge Orders    None       Janeece Fitting, PA-C 08/15/18 [place], Adam, DO 08/16/18 0001

## 2018-08-15 NOTE — ED Triage Notes (Signed)
Patient states that she was shopping at an antique mall and cut her left lower leg - bleeding is controlled and dressed

## 2018-08-15 NOTE — Discharge Instructions (Addendum)
Applied Steri-Strips to your wound along with Dermabond please keep this area clean and dry, Steri-Strips she will follow off in the next week.  If you experience any redness, swelling, drainage from the wound please schedule an appointment with your PCP or return to the ED

## 2018-08-19 ENCOUNTER — Other Ambulatory Visit (INDEPENDENT_AMBULATORY_CARE_PROVIDER_SITE_OTHER): Payer: BLUE CROSS/BLUE SHIELD

## 2018-08-19 ENCOUNTER — Other Ambulatory Visit: Payer: Self-pay | Admitting: Family Medicine

## 2018-08-19 DIAGNOSIS — Z9489 Other transplanted organ and tissue status: Secondary | ICD-10-CM

## 2018-08-19 LAB — COMPREHENSIVE METABOLIC PANEL
ALT: 12 U/L (ref 0–35)
AST: 13 U/L (ref 0–37)
Albumin: 4.4 g/dL (ref 3.5–5.2)
Alkaline Phosphatase: 96 U/L (ref 39–117)
BILIRUBIN TOTAL: 0.7 mg/dL (ref 0.2–1.2)
BUN: 25 mg/dL — ABNORMAL HIGH (ref 6–23)
CALCIUM: 9.2 mg/dL (ref 8.4–10.5)
CO2: 28 meq/L (ref 19–32)
Chloride: 103 mEq/L (ref 96–112)
Creatinine, Ser: 1.5 mg/dL — ABNORMAL HIGH (ref 0.40–1.20)
GFR: 37.68 mL/min — AB (ref >60.00)
GLUCOSE: 113 mg/dL — AB (ref 70–99)
POTASSIUM: 4.2 meq/L (ref 3.5–5.1)
SODIUM: 139 meq/L (ref 135–145)
TOTAL PROTEIN: 6 g/dL (ref 6.0–8.3)

## 2018-08-19 LAB — CBC WITH DIFFERENTIAL/PLATELET
Basophils Absolute: 0 10*3/uL (ref 0.0–0.1)
Basophils Relative: 1.2 % (ref 0.0–3.0)
EOS ABS: 0.1 10*3/uL (ref 0.0–0.7)
Eosinophils Relative: 2.7 % (ref 0.0–5.0)
HCT: 31.8 % — ABNORMAL LOW (ref 36.0–46.0)
HEMOGLOBIN: 11.4 g/dL — AB (ref 12.0–15.0)
LYMPHS PCT: 30.7 % (ref 12.0–46.0)
Lymphs Abs: 1 10*3/uL (ref 0.7–4.0)
MCHC: 35.8 g/dL (ref 30.0–36.0)
MCV: 88.1 fl (ref 78.0–100.0)
MONO ABS: 0.5 10*3/uL (ref 0.1–1.0)
Monocytes Relative: 14 % — ABNORMAL HIGH (ref 3.0–12.0)
Neutro Abs: 1.7 10*3/uL (ref 1.4–7.7)
Neutrophils Relative %: 51.4 % (ref 43.0–77.0)
Platelets: 167 10*3/uL (ref 150.0–400.0)
RBC: 3.6 Mil/uL — AB (ref 3.87–5.11)
RDW: 15.5 % (ref 11.5–15.5)
WBC: 3.3 10*3/uL — AB (ref 4.0–10.5)

## 2018-08-19 LAB — MAGNESIUM: MAGNESIUM: 1.9 mg/dL (ref 1.5–2.5)

## 2018-08-20 LAB — TACROLIMUS LEVEL: Tacrolimus (FK506), Blood: 2.5 ng/mL — ABNORMAL LOW

## 2018-09-07 ENCOUNTER — Other Ambulatory Visit (INDEPENDENT_AMBULATORY_CARE_PROVIDER_SITE_OTHER): Payer: BLUE CROSS/BLUE SHIELD

## 2018-09-07 ENCOUNTER — Encounter: Payer: Self-pay | Admitting: Family Medicine

## 2018-09-07 DIAGNOSIS — Z9489 Other transplanted organ and tissue status: Secondary | ICD-10-CM | POA: Diagnosis not present

## 2018-09-07 LAB — MAGNESIUM: MAGNESIUM: 2 mg/dL (ref 1.5–2.5)

## 2018-09-07 LAB — CBC WITH DIFFERENTIAL/PLATELET
Basophils Absolute: 0 10*3/uL (ref 0.0–0.1)
Basophils Relative: 0.8 % (ref 0.0–3.0)
EOS ABS: 0.1 10*3/uL (ref 0.0–0.7)
EOS PCT: 2.6 % (ref 0.0–5.0)
HCT: 30.5 % — ABNORMAL LOW (ref 36.0–46.0)
Hemoglobin: 10.8 g/dL — ABNORMAL LOW (ref 12.0–15.0)
LYMPHS PCT: 38.8 % (ref 12.0–46.0)
Lymphs Abs: 0.9 10*3/uL (ref 0.7–4.0)
MCHC: 35.3 g/dL (ref 30.0–36.0)
MCV: 89 fl (ref 78.0–100.0)
MONOS PCT: 16.8 % — AB (ref 3.0–12.0)
Monocytes Absolute: 0.4 10*3/uL (ref 0.1–1.0)
NEUTROS PCT: 41 % — AB (ref 43.0–77.0)
Neutro Abs: 1 10*3/uL — ABNORMAL LOW (ref 1.4–7.7)
PLATELETS: 144 10*3/uL — AB (ref 150.0–400.0)
RBC: 3.43 Mil/uL — ABNORMAL LOW (ref 3.87–5.11)
RDW: 15.1 % (ref 11.5–15.5)

## 2018-09-07 LAB — COMPREHENSIVE METABOLIC PANEL
ALT: 15 U/L (ref 0–35)
AST: 15 U/L (ref 0–37)
Albumin: 4.1 g/dL (ref 3.5–5.2)
Alkaline Phosphatase: 90 U/L (ref 39–117)
BUN: 28 mg/dL — AB (ref 6–23)
CHLORIDE: 108 meq/L (ref 96–112)
CO2: 25 mEq/L (ref 19–32)
Calcium: 8.7 mg/dL (ref 8.4–10.5)
Creatinine, Ser: 1.63 mg/dL — ABNORMAL HIGH (ref 0.40–1.20)
GFR: 34.23 mL/min — ABNORMAL LOW (ref >60.00)
GLUCOSE: 112 mg/dL — AB (ref 70–99)
POTASSIUM: 4.2 meq/L (ref 3.5–5.1)
SODIUM: 142 meq/L (ref 135–145)
Total Bilirubin: 0.5 mg/dL (ref 0.2–1.2)
Total Protein: 5.7 g/dL — ABNORMAL LOW (ref 6.0–8.3)

## 2018-09-08 LAB — TACROLIMUS LEVEL: TACROLIMUS LVL: 4.3 ng/mL — AB

## 2018-09-11 ENCOUNTER — Telehealth: Payer: Self-pay | Admitting: Family Medicine

## 2018-09-11 DIAGNOSIS — Z9489 Other transplanted organ and tissue status: Secondary | ICD-10-CM

## 2018-09-11 NOTE — Telephone Encounter (Signed)
Done, thanks Mel Almond!

## 2018-09-11 NOTE — Telephone Encounter (Signed)
Spoke with patient,I scheduled her for next Wednesday 18th at 8:40 am.

## 2018-09-11 NOTE — Telephone Encounter (Signed)
Patient came in and dropped off a CMV Quant DNA PCR External lab order stating that Dr. Lorelei Pont usually puts the order in for her to have the labs drawn here.  Advised that Copland will be back Monday.  Patient acknowledged understanding.  Placed in Copland's box.  Bailey: Please contact patient to schedule once the orders are in as we cannot schedule until they have been placed.

## 2018-09-11 NOTE — Telephone Encounter (Signed)
Dr. Lorelei Pont, will you place orders for patient to come in next week? I will schedule patient once placed!

## 2018-09-15 ENCOUNTER — Encounter: Payer: Self-pay | Admitting: Family Medicine

## 2018-09-16 ENCOUNTER — Other Ambulatory Visit (INDEPENDENT_AMBULATORY_CARE_PROVIDER_SITE_OTHER): Payer: BLUE CROSS/BLUE SHIELD

## 2018-09-16 DIAGNOSIS — Z9489 Other transplanted organ and tissue status: Secondary | ICD-10-CM | POA: Diagnosis not present

## 2018-09-16 NOTE — Telephone Encounter (Signed)
Completed Physician section on Disability Parking Placard Application; forwarded to provider/SLS 12/18

## 2018-09-16 NOTE — Addendum Note (Signed)
Addended by: Kelle Darting A on: 09/16/2018 07:44 AM   Modules accepted: Orders

## 2018-09-17 ENCOUNTER — Telehealth: Payer: Self-pay | Admitting: Family Medicine

## 2018-09-17 LAB — SPECIMEN STATUS

## 2018-09-17 NOTE — Telephone Encounter (Signed)
Copied from [place] [id number]. Topic: Quick Communication - See Telephone Encounter >> Sep 17, 2018  3:28 PM Margot Ables wrote: CRM for notification. See Telephone encounter for: 09/17/18.  CMV DNA, quantitative, PCR - this order requires frozen plasma Alicia [name] is requesting a call for verbal approval for the correct order to be done with whole blood.

## 2018-09-18 ENCOUNTER — Other Ambulatory Visit: Payer: Self-pay

## 2018-09-18 ENCOUNTER — Encounter: Payer: Self-pay | Admitting: Family Medicine

## 2018-09-18 DIAGNOSIS — Z944 Liver transplant status: Secondary | ICD-10-CM

## 2018-09-18 NOTE — Telephone Encounter (Signed)
I have called several times and left messages on VM.  I am glad to approve any orders needed.  It looks like we just re-ordered the CMV for Mikela anyway

## 2018-09-18 NOTE — Progress Notes (Signed)
Highlighted

## 2018-09-19 LAB — CMV DNA, QUANTITATIVE, PCR

## 2018-09-21 ENCOUNTER — Other Ambulatory Visit (INDEPENDENT_AMBULATORY_CARE_PROVIDER_SITE_OTHER): Payer: BLUE CROSS/BLUE SHIELD

## 2018-09-21 DIAGNOSIS — Z944 Liver transplant status: Secondary | ICD-10-CM | POA: Diagnosis not present

## 2018-09-21 IMAGING — DX DG TIBIA/FIBULA 2V*L*
3 series · 3 of 3 positions shown · non-contrast
Comparison: None.

CLINICAL DATA: Acute onset of left leg swelling and erythema.
Fever. Initial encounter.

EXAM:
LEFT TIBIA AND FIBULA - 2 VIEW

[tibia ap]
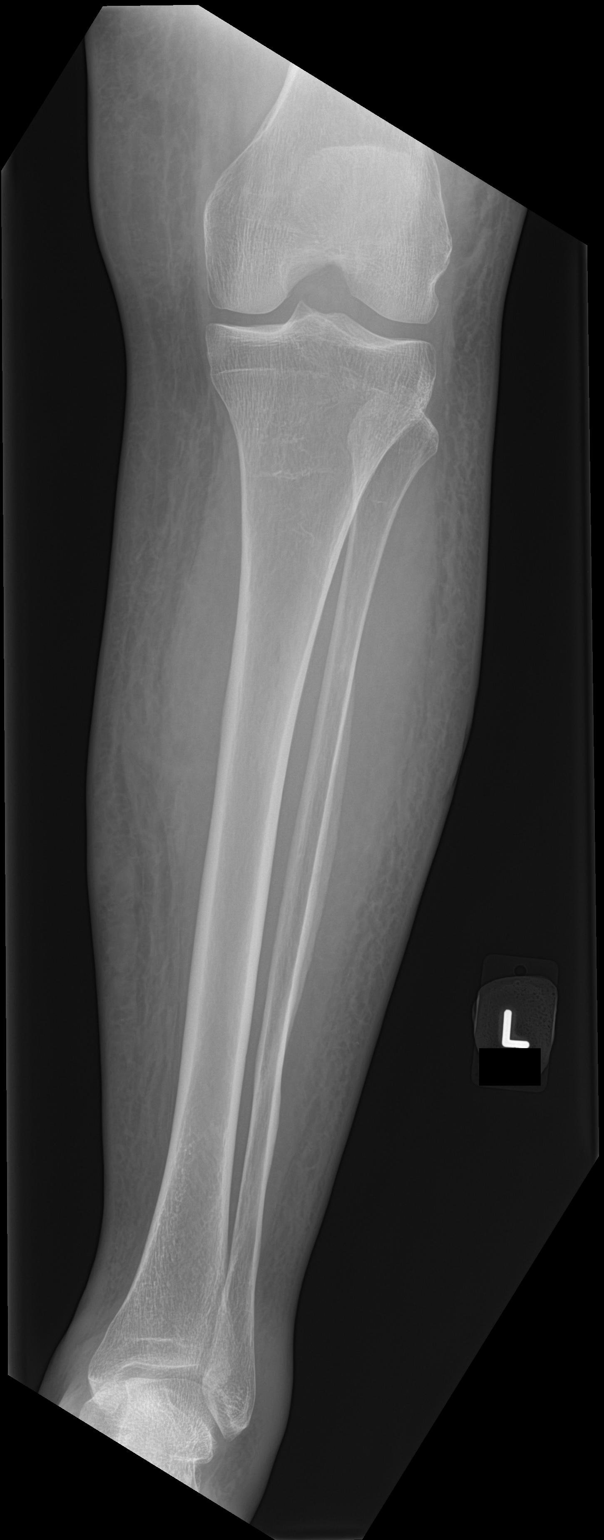

[tibia lat (1 of 2)]
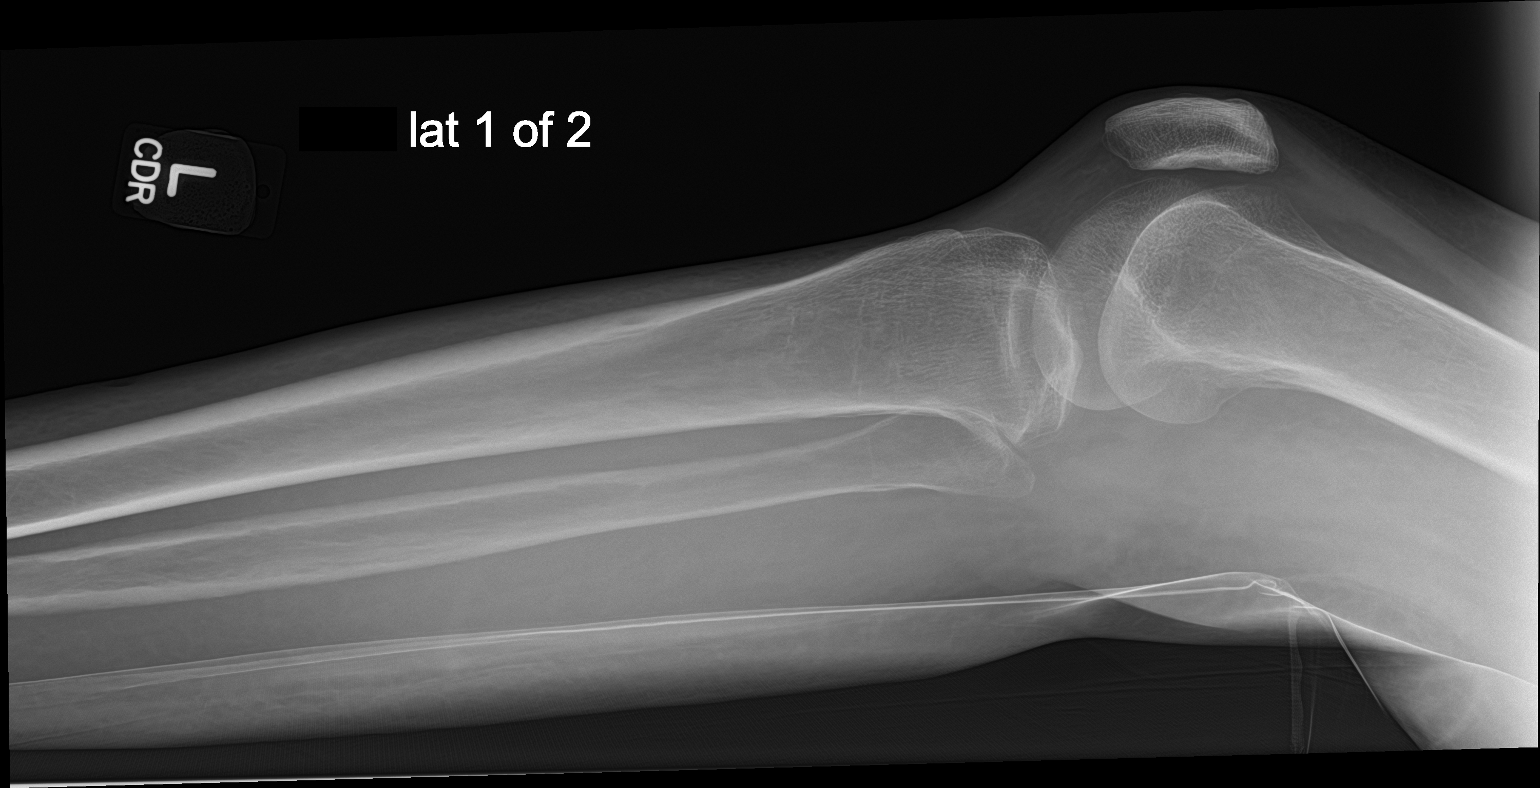

[tibia lat (2 of 2)]
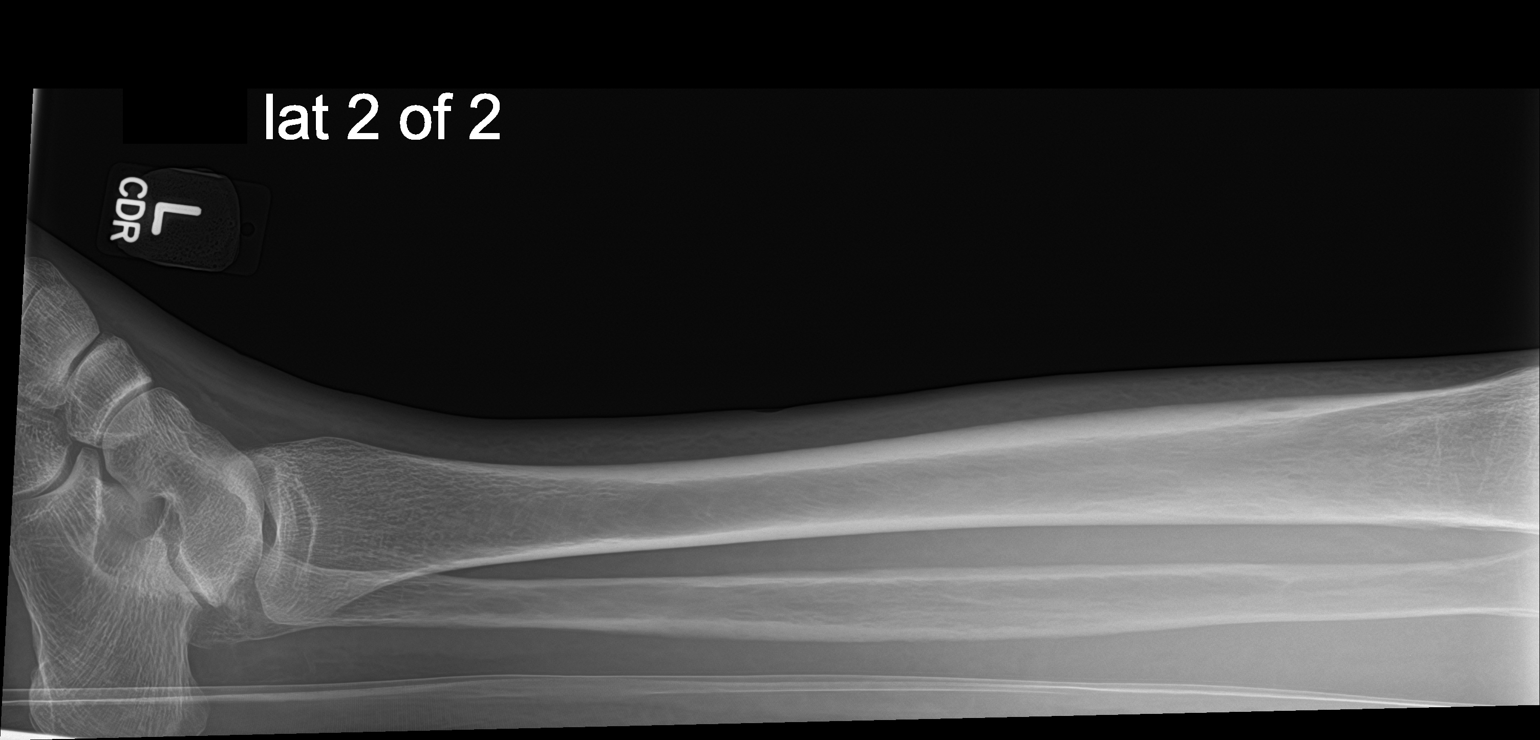

[3 of 3 positions shown; findings below may reference images not displayed]

FINDINGS: There is no evidence of fracture or dislocation. The tibia and
fibula appear grossly intact. The knee joint is grossly unremarkable
in appearance. No knee joint effusion is identified.

Diffuse soft tissue swelling is noted about the left lower leg,
without definite evidence of soft tissue air.
IMPRESSION: No evidence of fracture or dislocation.

## 2018-09-21 NOTE — Telephone Encounter (Signed)
Received Lab Report results from [facility]; forwarded to provider/SLS 12/23

## 2018-09-22 LAB — COMPREHENSIVE METABOLIC PANEL
A/G RATIO: 2.9 — AB (ref 1.2–2.2)
ALBUMIN: 4.3 g/dL (ref 3.5–5.5)
ALT: 6 IU/L (ref 0–32)
AST: 12 IU/L (ref 0–40)
Alkaline Phosphatase: 102 IU/L (ref 39–117)
BILIRUBIN TOTAL: 0.4 mg/dL (ref 0.0–1.2)
BUN / CREAT RATIO: 16 (ref 9–23)
BUN: 24 mg/dL (ref 6–24)
CHLORIDE: 105 mmol/L (ref 96–106)
CO2: 22 mmol/L (ref 20–29)
Calcium: 9.2 mg/dL (ref 8.7–10.2)
Creatinine, Ser: 1.49 mg/dL — ABNORMAL HIGH (ref 0.57–1.00)
GFR calc Af Amer: 44 mL/min/{1.73_m2} — ABNORMAL LOW (ref >59)
GFR calc non Af Amer: 38 mL/min/{1.73_m2} — ABNORMAL LOW (ref >59)
GLOBULIN, TOTAL: 1.5 g/dL (ref 1.5–4.5)
Glucose: 97 mg/dL (ref 65–99)
POTASSIUM: 4.2 mmol/L (ref 3.5–5.2)
SODIUM: 142 mmol/L (ref 134–144)
Total Protein: 5.8 g/dL — ABNORMAL LOW (ref 6.0–8.5)

## 2018-09-22 LAB — CBC WITH DIFFERENTIAL/PLATELET
Basophils Absolute: 0 10*3/uL (ref 0.0–0.2)
Basos: 1 %
EOS (ABSOLUTE): 0.1 10*3/uL (ref 0.0–0.4)
EOS: 3 %
HEMATOCRIT: 31.2 % — AB (ref 34.0–46.6)
HEMOGLOBIN: 11.1 g/dL (ref 11.1–15.9)
Immature Grans (Abs): 0 10*3/uL (ref 0.0–0.1)
Immature Granulocytes: 0 %
LYMPHS ABS: 1.1 10*3/uL (ref 0.7–3.1)
Lymphs: 42 %
MCH: 31.1 pg (ref 26.6–33.0)
MCHC: 35.6 g/dL (ref 31.5–35.7)
MCV: 87 fL (ref 79–97)
MONOS ABS: 0.3 10*3/uL (ref 0.1–0.9)
Monocytes: 12 %
NEUTROS ABS: 1.1 10*3/uL — AB (ref 1.4–7.0)
Neutrophils: 42 %
Platelets: 162 10*3/uL (ref 150–450)
RBC: 3.57 x10E6/uL — ABNORMAL LOW (ref 3.77–5.28)
RDW: 13.7 % (ref 12.3–15.4)
WBC: 2.6 10*3/uL — AB (ref 3.4–10.8)

## 2018-09-22 LAB — CMV DNA BY PCR, QUALITATIVE: CMV DNA QL PCR: NEGATIVE

## 2018-09-22 LAB — MAGNESIUM: MAGNESIUM: 2 mg/dL (ref 1.6–2.3)

## 2018-09-22 LAB — TACROLIMUS LEVEL: TACROLIMUS LVL: 4.3 ng/mL (ref 2.0–20.0)

## 2018-09-22 LAB — SPECIMEN STATUS REPORT

## 2018-09-25 ENCOUNTER — Telehealth: Payer: Self-pay | Admitting: *Deleted

## 2018-09-25 LAB — CMV DNA, QUANTITATIVE, PCR: CMV DNA QUANT: NEGATIVE [IU]/mL

## 2018-09-25 NOTE — Telephone Encounter (Signed)
Received Lab Report results from [place], results have been faxed to [facility] program by provider's MA; forwarded to provider/SLS 12/27

## 2018-09-25 NOTE — Telephone Encounter (Signed)
Lab results from 12/23 faxed to [facility] liver transplant program at [phone] this AM per Dr. Lillie Fragmin request in [facility] absence.

## 2018-10-14 ENCOUNTER — Encounter: Payer: Self-pay | Admitting: Family Medicine

## 2018-10-14 ENCOUNTER — Other Ambulatory Visit: Payer: Self-pay | Admitting: Family Medicine

## 2018-10-14 ENCOUNTER — Other Ambulatory Visit: Payer: Self-pay

## 2018-10-14 DIAGNOSIS — E271 Primary adrenocortical insufficiency: Secondary | ICD-10-CM

## 2018-10-14 NOTE — Progress Notes (Signed)
I received a fax from [facility] endocrine, which is part of [facility].  They requested an ACTH level and cortisol for this patient.  The fax number is [phone] Phone [phone]  Message to patient letting her know I received these lab orders, we can drop in her convenience

## 2018-10-20 ENCOUNTER — Other Ambulatory Visit (INDEPENDENT_AMBULATORY_CARE_PROVIDER_SITE_OTHER): Payer: Managed Care, Other (non HMO)

## 2018-10-20 DIAGNOSIS — E271 Primary adrenocortical insufficiency: Secondary | ICD-10-CM

## 2018-10-20 DIAGNOSIS — Z9489 Other transplanted organ and tissue status: Secondary | ICD-10-CM

## 2018-10-20 LAB — COMPREHENSIVE METABOLIC PANEL
ALK PHOS: 88 U/L (ref 39–117)
ALT: 5 U/L (ref 0–35)
AST: 11 U/L (ref 0–37)
Albumin: 4.1 g/dL (ref 3.5–5.2)
BILIRUBIN TOTAL: 0.5 mg/dL (ref 0.2–1.2)
BUN: 25 mg/dL — ABNORMAL HIGH (ref 6–23)
CO2: 29 meq/L (ref 19–32)
Calcium: 9.2 mg/dL (ref 8.4–10.5)
Chloride: 105 mEq/L (ref 96–112)
Creatinine, Ser: 1.49 mg/dL — ABNORMAL HIGH (ref 0.40–1.20)
GFR: 35.71 mL/min — AB (ref >60.00)
GLUCOSE: 113 mg/dL — AB (ref 70–99)
Potassium: 4.2 mEq/L (ref 3.5–5.1)
Sodium: 140 mEq/L (ref 135–145)
TOTAL PROTEIN: 5.8 g/dL — AB (ref 6.0–8.3)

## 2018-10-20 LAB — CBC WITH DIFFERENTIAL/PLATELET
BASOS ABS: 0 10*3/uL (ref 0.0–0.1)
Basophils Relative: 0.9 % (ref 0.0–3.0)
EOS PCT: 2 % (ref 0.0–5.0)
Eosinophils Absolute: 0.1 10*3/uL (ref 0.0–0.7)
HCT: 32.7 % — ABNORMAL LOW (ref 36.0–46.0)
HEMOGLOBIN: 11.6 g/dL — AB (ref 12.0–15.0)
Lymphocytes Relative: 38.3 % (ref 12.0–46.0)
Lymphs Abs: 1.3 10*3/uL (ref 0.7–4.0)
MCHC: 35.5 g/dL (ref 30.0–36.0)
MCV: 88.7 fl (ref 78.0–100.0)
MONO ABS: 0.4 10*3/uL (ref 0.1–1.0)
MONOS PCT: 10.8 % (ref 3.0–12.0)
NEUTROS PCT: 48 % (ref 43.0–77.0)
Neutro Abs: 1.7 10*3/uL (ref 1.4–7.7)
Platelets: 158 10*3/uL (ref 150.0–400.0)
RBC: 3.68 Mil/uL — AB (ref 3.87–5.11)
RDW: 14.5 % (ref 11.5–15.5)
WBC: 3.5 10*3/uL — ABNORMAL LOW (ref 4.0–10.5)

## 2018-10-20 LAB — CORTISOL: Cortisol, Plasma: 2.7 ug/dL

## 2018-10-20 LAB — MAGNESIUM: MAGNESIUM: 1.9 mg/dL (ref 1.5–2.5)

## 2018-10-21 LAB — TACROLIMUS LEVEL: Tacrolimus (FK506), Blood: 5.9 ng/mL

## 2018-10-23 ENCOUNTER — Encounter: Payer: Self-pay | Admitting: *Deleted

## 2018-10-23 LAB — ACTH: C206 ACTH: 18 pg/mL (ref 6–50)

## 2018-12-15 ENCOUNTER — Encounter: Payer: Self-pay | Admitting: Family Medicine

## 2018-12-15 ENCOUNTER — Telehealth: Payer: Self-pay

## 2018-12-15 NOTE — Telephone Encounter (Signed)
Copied from [place] [phone]. Topic: General - Other >> Dec 10, 2018  4:57 PM Ivar Drape wrote: Reason for CRM:   Patient needs a lab but there are no future orders in the system for labs.  Please advise.

## 2018-12-15 NOTE — Telephone Encounter (Signed)
Message to pt- I need clarification about which orders are requested

## 2018-12-16 ENCOUNTER — Other Ambulatory Visit: Payer: Self-pay | Admitting: Family Medicine

## 2018-12-16 DIAGNOSIS — Z944 Liver transplant status: Secondary | ICD-10-CM

## 2018-12-16 NOTE — Telephone Encounter (Signed)
Please put her on the lab schedule for Friday am at 8am Thank you!

## 2018-12-18 ENCOUNTER — Other Ambulatory Visit: Payer: Self-pay

## 2018-12-18 ENCOUNTER — Encounter: Payer: Self-pay | Admitting: Family Medicine

## 2018-12-18 ENCOUNTER — Telehealth: Payer: Self-pay | Admitting: Family Medicine

## 2018-12-18 ENCOUNTER — Other Ambulatory Visit (INDEPENDENT_AMBULATORY_CARE_PROVIDER_SITE_OTHER): Payer: Managed Care, Other (non HMO)

## 2018-12-18 DIAGNOSIS — Z944 Liver transplant status: Secondary | ICD-10-CM

## 2018-12-18 LAB — COMPREHENSIVE METABOLIC PANEL
ALK PHOS: 84 U/L (ref 39–117)
ALT: 9 U/L (ref 0–35)
AST: 11 U/L (ref 0–37)
Albumin: 4.1 g/dL (ref 3.5–5.2)
BUN: 30 mg/dL — ABNORMAL HIGH (ref 6–23)
CO2: 26 mEq/L (ref 19–32)
Calcium: 8.7 mg/dL (ref 8.4–10.5)
Chloride: 107 mEq/L (ref 96–112)
Creatinine, Ser: 1.4 mg/dL — ABNORMAL HIGH (ref 0.40–1.20)
GFR: 38.35 mL/min — ABNORMAL LOW (ref >60.00)
Glucose, Bld: 95 mg/dL (ref 70–99)
Potassium: 4.4 mEq/L (ref 3.5–5.1)
Sodium: 140 mEq/L (ref 135–145)
Total Bilirubin: 0.6 mg/dL (ref 0.2–1.2)
Total Protein: 5.8 g/dL — ABNORMAL LOW (ref 6.0–8.3)

## 2018-12-18 LAB — CBC WITH DIFFERENTIAL/PLATELET
Basophils Absolute: 0 10*3/uL (ref 0.0–0.1)
Basophils Relative: 0.8 % (ref 0.0–3.0)
Eosinophils Absolute: 0.1 10*3/uL (ref 0.0–0.7)
Eosinophils Relative: 1.8 % (ref 0.0–5.0)
HCT: 30.1 % — ABNORMAL LOW (ref 36.0–46.0)
Hemoglobin: 10.8 g/dL — ABNORMAL LOW (ref 12.0–15.0)
Lymphocytes Relative: 41.2 % (ref 12.0–46.0)
Lymphs Abs: 1.2 10*3/uL (ref 0.7–4.0)
MCHC: 35.8 g/dL (ref 30.0–36.0)
MCV: 91.1 fl (ref 78.0–100.0)
Monocytes Absolute: 0.4 10*3/uL (ref 0.1–1.0)
Monocytes Relative: 11.9 % (ref 3.0–12.0)
Neutro Abs: 1.3 10*3/uL — ABNORMAL LOW (ref 1.4–7.7)
Neutrophils Relative %: 44.3 % (ref 43.0–77.0)
Platelets: 138 10*3/uL — ABNORMAL LOW (ref 150.0–400.0)
RBC: 3.3 Mil/uL — ABNORMAL LOW (ref 3.87–5.11)
RDW: 14.9 % (ref 11.5–15.5)
WBC: 3 10*3/uL — AB (ref 4.0–10.5)

## 2018-12-18 LAB — CORTISOL: CORTISOL PLASMA: 3.5 ug/dL

## 2018-12-18 LAB — MAGNESIUM: Magnesium: 1.9 mg/dL (ref 1.5–2.5)

## 2018-12-18 NOTE — Telephone Encounter (Signed)
Received labs, message to pt and will have faxed to her team at [facility]

## 2018-12-21 LAB — TACROLIMUS LEVEL: Tacrolimus (FK506), Blood: 3.7 ng/mL — ABNORMAL LOW

## 2018-12-21 LAB — ACTH: C206 ACTH: 18 pg/mL (ref 6–50)

## 2019-02-03 ENCOUNTER — Other Ambulatory Visit: Payer: Self-pay

## 2019-02-03 DIAGNOSIS — M792 Neuralgia and neuritis, unspecified: Secondary | ICD-10-CM

## 2019-02-03 MED ORDER — AMITRIPTYLINE HCL 25 MG PO TABS: ORAL_TABLET | ORAL | 0 refills | Status: DC

## 2019-03-03 ENCOUNTER — Other Ambulatory Visit (INDEPENDENT_AMBULATORY_CARE_PROVIDER_SITE_OTHER): Payer: Managed Care, Other (non HMO)

## 2019-03-03 ENCOUNTER — Other Ambulatory Visit: Payer: Self-pay

## 2019-03-03 DIAGNOSIS — Z944 Liver transplant status: Secondary | ICD-10-CM

## 2019-03-03 LAB — COMPREHENSIVE METABOLIC PANEL
ALT: 8 U/L (ref 0–35)
AST: 12 U/L (ref 0–37)
Albumin: 4.3 g/dL (ref 3.5–5.2)
Alkaline Phosphatase: 69 U/L (ref 39–117)
BUN: 29 mg/dL — ABNORMAL HIGH (ref 6–23)
CO2: 26 mEq/L (ref 19–32)
Calcium: 9 mg/dL (ref 8.4–10.5)
Chloride: 105 mEq/L (ref 96–112)
Creatinine, Ser: 1.51 mg/dL — ABNORMAL HIGH (ref 0.40–1.20)
GFR: 35.12 mL/min — ABNORMAL LOW (ref >60.00)
Glucose, Bld: 86 mg/dL (ref 70–99)
Potassium: 4.8 mEq/L (ref 3.5–5.1)
Sodium: 139 mEq/L (ref 135–145)
Total Bilirubin: 0.6 mg/dL (ref 0.2–1.2)
Total Protein: 6.3 g/dL (ref 6.0–8.3)

## 2019-03-03 LAB — CBC WITH DIFFERENTIAL/PLATELET
Basophils Absolute: 0 10*3/uL (ref 0.0–0.1)
Basophils Relative: 0.7 % (ref 0.0–3.0)
Eosinophils Absolute: 0 10*3/uL (ref 0.0–0.7)
Eosinophils Relative: 1.3 % (ref 0.0–5.0)
HCT: 35.1 % — ABNORMAL LOW (ref 36.0–46.0)
Hemoglobin: 12.6 g/dL (ref 12.0–15.0)
Lymphocytes Relative: 41.8 % (ref 12.0–46.0)
Lymphs Abs: 1.5 10*3/uL (ref 0.7–4.0)
MCHC: 36 g/dL (ref 30.0–36.0)
MCV: 90.4 fl (ref 78.0–100.0)
Monocytes Absolute: 0.4 10*3/uL (ref 0.1–1.0)
Monocytes Relative: 12.6 % — ABNORMAL HIGH (ref 3.0–12.0)
Neutro Abs: 1.5 10*3/uL (ref 1.4–7.7)
Neutrophils Relative %: 43.6 % (ref 43.0–77.0)
Platelets: 147 10*3/uL — ABNORMAL LOW (ref 150.0–400.0)
RBC: 3.89 Mil/uL (ref 3.87–5.11)
RDW: 13.1 % (ref 11.5–15.5)
WBC: 3.5 10*3/uL — ABNORMAL LOW (ref 4.0–10.5)

## 2019-03-03 LAB — MAGNESIUM: Magnesium: 2 mg/dL (ref 1.5–2.5)

## 2019-03-03 LAB — CORTISOL: Cortisol, Plasma: 3.1 ug/dL

## 2019-03-05 LAB — TACROLIMUS LEVEL: Tacrolimus (FK506), Blood: 5.4 ng/mL

## 2019-03-05 LAB — ACTH: C206 ACTH: 12 pg/mL (ref 6–50)

## 2019-04-06 NOTE — Progress Notes (Signed)
[place] [facility] [address], [place], [place] [address] [phone]  Date:  04/08/2019   Name:  Alicia [name]   DOB:  Mar 27, 1961   MRN:  [id number]  PCP:  Darreld Mclean, MD    Chief Complaint: Annual Exam (joint pain, due from medications) and Referral (rheumatoogy )   History of Present Illness:  Alicia [name] is a 58 y.o. 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  Since that time she has been doing quite well overall  Pap done in 2018 [facility] august 2019 Colon 2010- she is due for screening, will order cologuard for her  dexa a year ago   Her transplant team is at [facility]  She is going to follow-up with her cardiologist, endocrinologist and pulmonologist all through [facility]-appointments all scheduled for the next month  Her main concern today is arthritis. She is having more joint issues recently especially in her hands- her specialists have suggested that she see a rheumatologist  They suspect that her years of illness and medications may have contributed to more aggressive osteoarthritis Her hands are more difficult to use, she has a hard time holding things and using tools such as scissors, writing is also more difficult  Amitriptyline 25 qhs Persantine daily dulera spiriva Albuterol/ duoneb prn Prograf She was finally able to stop prednisone She is taking hydrocorisone 15 am, 5 pm She is now allowed to start on fosamax as of November  She has a few tramadol left for her hand pain-does not need anything else right now  Patient Active Problem List   Diagnosis Date Noted  . Osteoporosis without current pathological fracture  05/25/2018  . Status post liver transplant ([place]) 12/25/2017  . Syncope 07/17/2017  . Other cirrhosis of liver ([place]) 05/10/2017  . Immunocompromised patient ([place]) 05/10/2017  . Mucous cyst of digit of left hand 04/11/2017  . Cellulitis, abdominal wall 03/01/2017  . Normocytic anemia 03/01/2017  . Hypokalemia 03/01/2017  . CKD (chronic kidney disease), stage III ([place]) 03/01/2017  . Abdominal wall cellulitis 03/01/2017  . Adrenal insufficiency ([place]) 03/01/2017  . Hyperammonemia ([place])   . Cellulitis 06/28/2016  . Thrombocytopenia ([place]) 06/28/2016  . AKI (acute kidney injury) ([place]) 06/28/2016  . Multiple pulmonary nodules determined by computed tomography of lung   . Chronic cough 09/07/2015  . Hypertension, portal ([place]) 09/04/2011  . Asthma with chronic obstructive pulmonary disease (COPD) ([place]) 09/04/2011  . 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 GERD (gastroesophageal reflux disease) 07/04/2011  . Sarcoidosis 07/01/2011  . Asthma, severe persistent, with upper airway instability 06/13/2011  . 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 Allergy   . Anemia   . Asthma   . Cellulitis   . Cirrhosis ([place])   . GERD (gastroesophageal reflux disease)   . Hepatic fibrosis    congenital  . History of UTI    chronic as a child  . Sarcoidosis   . Spleen enlarged     Past Surgical History:  Procedure Laterality Date  . COLONOSCOPY    . CYST EXCISION Left 05/01/2017   Procedure: LEFT INDEX FINGER  CYST EXCISION;  Surgeon: Leandrew Koyanagi, MD;  Location: [place];  Service: Orthopedics;  Laterality: Left;  . ESOPHAGOGASTRODUODENOSCOPY    . LIVER TRANSPLANT    . SPINAL FUSION  12/2007   Double spinal Fusion  . VIDEO BRONCHOSCOPY Bilateral 12/08/2015   Procedure: VIDEO BRONCHOSCOPY WITH FLUORO;  Surgeon: Rigoberto Noel, MD;  Location: [facility];  Service: Cardiopulmonary;  Laterality: Bilateral;    Social History   Tobacco Use  . Smoking status: Never  Smoker  . Smokeless tobacco: Never Used  Substance Use Topics  . Alcohol use: No  . Drug use: No    Family History  Problem Relation Age of Onset  . Arthritis Mother   . Heart disease Father        Triple bypass  . Migraines Father   . Heart attack Paternal Grandmother   . Stroke Paternal Grandfather   . Liver disease Sister        Juvenile cysitc liver disease  . Asthma Sister     Allergies  Allergen Reactions  . Aspirin Other (See Comments)    Causes Asthma  . Peanut-Containing Drug Products Shortness Of Breath    Asthma, eye swelling, itchy eyes, can't catch her breath  . Gabapentin Other (See Comments)    Blisters in mouth, nausea   . Azithromycin Nausea And Vomiting    Medication list has been reviewed and updated.  Current Outpatient Medications on File Prior to Visit  Medication Sig Dispense Refill  . albuterol (PROVENTIL HFA;VENTOLIN HFA) 108 (90 Base) MCG/ACT inhaler Inhale 1-2 puffs into the lungs every 6 (six) hours as needed for wheezing or shortness of breath. 1 Inhaler 0  . amitriptyline (ELAVIL) 25 MG tablet TAKE 1 OR 2 TABLETS BY MOUTH AT BEDTIME FOR NERVE PAIN 180 tablet 0  . bumetanide (BUMEX) 1 MG tablet Take 1 mg by mouth daily.    . calcium citrate-vitamin D (CITRACAL+D) 315-200 MG-UNIT tablet Take 1 tablet by mouth 2 (two) times daily.    Marland Kitchen dipyridamole (PERSANTINE) 50 MG tablet Take 50 mg by mouth daily.    . DULERA 200-5 MCG/ACT AERO INHALE 2 PUFFS BY MOUTH TWO TIMES DAILY. 13 Inhaler 0  . ipratropium-albuterol (DUONEB) 0.5-2.5 (3) MG/3ML SOLN Take 3 mLs by nebulization every 4 (four) hours as needed. 360 mL 0  . magnesium oxide (MAG-OX) 400 MG tablet Take 400 mg by mouth 2 (two) times daily.    . pantoprazole (PROTONIX) 40 MG tablet Take 40 mg by mouth daily.    Marland Kitchen SPIRIVA RESPIMAT 2.5 MCG/ACT AERS Inhale 2 puffs into the lungs daily at 2 PM.   11  . tacrolimus (PROGRAF) 1 MG capsule Take 1 mg by mouth 2 (two) times a day. TAKE 2 TABLETS (3MG   TOTAL) IN THE MORNING AND THREE TABLETS (3MG  TOTAL) IN THE EVENING.    . predniSONE (DELTASONE) 5 MG tablet Take 1 tablet (5 mg total) by mouth daily with breakfast.    . traMADol (ULTRAM) 50 MG tablet Take 1 tablet (50 mg total) by mouth every 8 (eight) hours as needed. (Patient not taking: Reported on 04/08/2019) 12 tablet 0   No current facility-administered medications on file prior to visit.     Review of Systems:  As per HPI- otherwise negative. No fever or chills She notes that both her parents are alive and still quite active in their mid to late 80s  Physical Examination: Vitals:   04/08/19 1321  BP: 124/80  Pulse: 88  Resp: 16  Temp: 98.2 F (36.8 C)  SpO2: 99%   Vitals:   04/08/19 1321  Weight: 162 lb (73.5 kg)  Height: 5\' 2"  (1.575 m)   Body mass index is 29.63 kg/m. Ideal Body Weight: Weight in (lb) to have BMI = 25: 136.4  GEN: WDWN, NAD, Non-toxic, A & O x 3, mild overweight, looks well.  She looks great HEENT: Atraumatic, Normocephalic. Neck supple. No masses, No LAD. Ears and Nose: No external deformity. CV: RRR, No M/G/R. No JVD. No thrill. No extra heart sounds. PULM: CTA B, no wheezes, crackles, rhonchi. No retractions. No resp. distress. No accessory muscle use. ABD: S, NT, ND, +BS. No rebound. No HSM.  Abdomen is slightly distended as normal for her.  Large well-healed surgical scar EXTR: No c/c/e.  Well-healed skin graft on right leg Her hands show evidence of likely osteoarthritis.  She has nodularity and thickening of her joints, especially the MCP and DIP joints NEURO Normal gait.  PSYCH: Normally interactive. Conversant. Not depressed or anxious appearing.  Calm demeanor.    Assessment and Plan:   ICD-10-CM   1. Physical exam  Z00.00   2. Screening for colon cancer  Z12.11   3. Arthralgia, unspecified joint  M25.50   4. Primary osteoarthritis of both hands  M19.041 Ambulatory referral to Rheumatology   M19.042    Following up for a physical  and recheck today Ordered Cologuard test for colon cancer screening Referral to rheumatology to discuss her arthritis Did not do any labs today, she will come in for her routine labs in September and I will order a cholesterol panel for her as well at that time As always, we are thrilled to see Chrys doing so well  Follow-up: No follow-ups on file.  No orders of the defined types were placed in this encounter.  Orders Placed This Encounter  Procedures  . Ambulatory referral to Rheumatology     Signed Lamar Blinks, MD

## 2019-04-08 ENCOUNTER — Ambulatory Visit (INDEPENDENT_AMBULATORY_CARE_PROVIDER_SITE_OTHER): Payer: Managed Care, Other (non HMO) | Admitting: Family Medicine

## 2019-04-08 ENCOUNTER — Other Ambulatory Visit: Payer: Self-pay

## 2019-04-08 ENCOUNTER — Encounter: Payer: Self-pay | Admitting: Family Medicine

## 2019-04-08 VITALS — BP 124/80 | HR 88 | Temp 98.2°F | Resp 16 | Ht 62.0 in | Wt 162.0 lb

## 2019-04-08 DIAGNOSIS — M19041 Primary osteoarthritis, right hand: Secondary | ICD-10-CM | POA: Diagnosis not present

## 2019-04-08 DIAGNOSIS — Z0000 Encounter for general adult medical examination without abnormal findings: Secondary | ICD-10-CM | POA: Diagnosis not present

## 2019-04-08 DIAGNOSIS — Z1211 Encounter for screening for malignant neoplasm of colon: Secondary | ICD-10-CM

## 2019-04-08 DIAGNOSIS — M19042 Primary osteoarthritis, left hand: Secondary | ICD-10-CM

## 2019-04-08 DIAGNOSIS — M2550 Pain in unspecified joint: Secondary | ICD-10-CM

## 2019-04-08 NOTE — Patient Instructions (Addendum)
It was great to see you again today- we are glad to see you doing well!    Let me know when you want to come in for labs in September and I will order a cholesterol for you as well  We will refer you to see rheumatology for your hands as well  You should get a cologuard kit at home to screen for colon cancer- if positive we will send you for a colonoscopy   Health Maintenance, Female Adopting a healthy lifestyle and getting preventive care are important in promoting health and wellness. Ask your health care provider about:  The right schedule for you to have regular tests and exams.  Things you can do on your own to prevent diseases and keep yourself healthy. What should I know about diet, weight, and exercise? Eat a healthy diet   Eat a diet that includes plenty of vegetables, fruits, low-fat dairy products, and lean protein.  Do not eat a lot of foods that are high in solid fats, added sugars, or sodium. Maintain a healthy weight Body mass index (BMI) is used to identify weight problems. It estimates body fat based on height and weight. Your health care provider can help determine your BMI and help you achieve or maintain a healthy weight. Get regular exercise Get regular exercise. This is one of the most important things you can do for your health. Most adults should:  Exercise for at least 150 minutes each week. The exercise should increase your heart rate and make you sweat (moderate-intensity exercise).  Do strengthening exercises at least twice a week. This is in addition to the moderate-intensity exercise.  Spend less time sitting. Even light physical activity can be beneficial. Watch cholesterol and blood lipids Have your blood tested for lipids and cholesterol at 60 years of age, then have this test every 5 years. Have your cholesterol levels checked more often if:  Your lipid or cholesterol levels are high.  You are older than 60 years of age.  You are at high risk  for heart disease. What should I know about cancer screening? Depending on your health history and family history, you may need to have cancer screening at various ages. This may include screening for:  Breast cancer.  Cervical cancer.  Colorectal cancer.  Skin cancer.  Lung cancer. What should I know about heart disease, diabetes, and high blood pressure? Blood pressure and heart disease  High blood pressure causes heart disease and increases the risk of stroke. This is more likely to develop in people who have high blood pressure readings, are of African descent, or are overweight.  Have your blood pressure checked: ? Every 3-5 years if you are 60-60 years of age. ? Every year if you are 60 years old or older. Diabetes Have regular diabetes screenings. This checks your fasting blood sugar level. Have the screening done:  Once every three years after age 60 if you are at a normal weight and have a low risk for diabetes.  More often and at a younger age if you are overweight or have a high risk for diabetes. What should I know about preventing infection? Hepatitis B If you have a higher risk for hepatitis B, you should be screened for this virus. Talk with your health care provider to find out if you are at risk for hepatitis B infection. Hepatitis C Testing is recommended for:  Everyone born from [birth year] through 1967.  Anyone with known risk factors for hepatitis C.  Sexually transmitted infections (STIs)  Get screened for STIs, including gonorrhea and chlamydia, if: ? You are sexually active and are younger than 60 years of age. ? You are older than 60 years of age and your health care provider tells you that you are at risk for this type of infection. ? Your sexual activity has changed since you were last screened, and you are at increased risk for chlamydia or gonorrhea. Ask your health care provider if you are at risk.  Ask your health care provider about whether you are  at high risk for HIV. Your health care provider may recommend a prescription medicine to help prevent HIV infection. If you choose to take medicine to prevent HIV, you should first get tested for HIV. You should then be tested every 3 months for as long as you are taking the medicine. Pregnancy  If you are about to stop having your period (premenopausal) and you may become pregnant, seek counseling before you get pregnant.  Take 400 to 800 micrograms (mcg) of folic acid every day if you become pregnant.  Ask for birth control (contraception) if you want to prevent pregnancy. Osteoporosis and menopause Osteoporosis is a disease in which the bones lose minerals and strength with aging. This can result in bone fractures. If you are 60 years old or older, or if you are at risk for osteoporosis and fractures, ask your health care provider if you should:  Be screened for bone loss.  Take a calcium or vitamin D supplement to lower your risk of fractures.  Be given hormone replacement therapy (HRT) to treat symptoms of menopause. Follow these instructions at home: Lifestyle  Do not use any products that contain nicotine or tobacco, such as cigarettes, e-cigarettes, and chewing tobacco. If you need help quitting, ask your health care provider.  Do not use street drugs.  Do not share needles.  Ask your health care provider for help if you need support or information about quitting drugs. Alcohol use  Do not drink alcohol if: ? Your health care provider tells you not to drink. ? You are pregnant, may be pregnant, or are planning to become pregnant.  If you drink alcohol: ? Limit how much you use to 0-1 drink a day. ? Limit intake if you are breastfeeding.  Be aware of how much alcohol is in your drink. In the [place] one drink equals one 12 oz bottle of beer (355 mL), one 5 oz glass of wine (148 mL), or one 1 oz glass of hard liquor (44 mL). General instructions  Schedule regular health,  dental, and eye exams.  Stay current with your vaccines.  Tell your health care provider if: ? You often feel depressed. ? You have ever been abused or do not feel safe at home. Summary  Adopting a healthy lifestyle and getting preventive care are important in promoting health and wellness.  Follow your health care provider's instructions about healthy diet, exercising, and getting tested or screened for diseases.  Follow your health care provider's instructions on monitoring your cholesterol and blood pressure. This information is not intended to replace advice given to you by your health care provider. Make sure you discuss any questions you have with your health care provider. Document Released: 04/01/2011 Document Revised: 09/09/2018 Document Reviewed: 09/09/2018 Elsevier Patient Education  2020 [employer].

## 2019-04-13 ENCOUNTER — Encounter: Payer: Self-pay | Admitting: Family Medicine

## 2019-04-21 LAB — COLOGUARD: Cologuard: POSITIVE — AB

## 2019-04-26 ENCOUNTER — Other Ambulatory Visit: Payer: Self-pay | Admitting: Family Medicine

## 2019-04-26 DIAGNOSIS — M792 Neuralgia and neuritis, unspecified: Secondary | ICD-10-CM

## 2019-05-02 ENCOUNTER — Encounter: Payer: Self-pay | Admitting: Family Medicine

## 2019-05-03 ENCOUNTER — Telehealth: Payer: Self-pay | Admitting: Family Medicine

## 2019-05-03 NOTE — Telephone Encounter (Signed)
Pt dropped document to be filled out by provider (2 pages Medical Questionaire with white envelope with stamp) Pt would like document to be sent by mail when ready. Document put at front office tray under providers name.

## 2019-05-03 NOTE — Telephone Encounter (Signed)
Placed forms in providers schedule.

## 2019-05-04 ENCOUNTER — Encounter: Payer: Self-pay | Admitting: Family Medicine

## 2019-05-06 ENCOUNTER — Encounter: Payer: Self-pay | Admitting: Family Medicine

## 2019-05-06 DIAGNOSIS — Z5181 Encounter for therapeutic drug level monitoring: Secondary | ICD-10-CM

## 2019-05-10 ENCOUNTER — Encounter: Payer: Self-pay | Admitting: Family Medicine

## 2019-05-13 ENCOUNTER — Other Ambulatory Visit: Payer: Self-pay

## 2019-05-13 ENCOUNTER — Telehealth: Payer: Self-pay | Admitting: Family Medicine

## 2019-05-13 NOTE — Telephone Encounter (Signed)
Beth with Dr Murlean Hark office called and stated that they would need an order for a colonoscopy. Please advise See my chart message from 05/02/19.

## 2019-05-13 NOTE — Telephone Encounter (Signed)
Pt states [facility] advised her only BMP needed.

## 2019-05-14 NOTE — Telephone Encounter (Signed)
Called her GI office - I am confused as I don't typically order a colonoscopy.  They will check with her GI doc and get back to me, provided my cell number

## 2019-05-15 NOTE — Addendum Note (Signed)
Addended by: Lamar Blinks C on: 05/15/2019 07:34 AM   Modules accepted: Orders

## 2019-05-17 ENCOUNTER — Encounter: Payer: Self-pay | Admitting: Family Medicine

## 2019-05-17 ENCOUNTER — Other Ambulatory Visit (INDEPENDENT_AMBULATORY_CARE_PROVIDER_SITE_OTHER): Payer: Managed Care, Other (non HMO)

## 2019-05-17 ENCOUNTER — Other Ambulatory Visit: Payer: Self-pay

## 2019-05-17 DIAGNOSIS — Z5181 Encounter for therapeutic drug level monitoring: Secondary | ICD-10-CM

## 2019-05-17 LAB — BASIC METABOLIC PANEL
BUN: 32 mg/dL — ABNORMAL HIGH (ref 6–23)
CO2: 28 mEq/L (ref 19–32)
Calcium: 9.1 mg/dL (ref 8.4–10.5)
Chloride: 104 mEq/L (ref 96–112)
Creatinine, Ser: 1.67 mg/dL — ABNORMAL HIGH (ref 0.40–1.20)
GFR: 31.24 mL/min — ABNORMAL LOW (ref >60.00)
Glucose, Bld: 115 mg/dL — ABNORMAL HIGH (ref 70–99)
Potassium: 4.7 mEq/L (ref 3.5–5.1)
Sodium: 140 mEq/L (ref 135–145)

## 2019-06-14 ENCOUNTER — Encounter: Payer: Self-pay | Admitting: Family Medicine

## 2019-06-15 ENCOUNTER — Encounter: Payer: Self-pay | Admitting: Family Medicine

## 2019-06-15 ENCOUNTER — Other Ambulatory Visit: Payer: Self-pay | Admitting: Family Medicine

## 2019-06-15 DIAGNOSIS — Z944 Liver transplant status: Secondary | ICD-10-CM

## 2019-06-29 ENCOUNTER — Other Ambulatory Visit: Payer: Self-pay

## 2019-06-29 ENCOUNTER — Other Ambulatory Visit (INDEPENDENT_AMBULATORY_CARE_PROVIDER_SITE_OTHER): Payer: Managed Care, Other (non HMO)

## 2019-06-29 DIAGNOSIS — Z944 Liver transplant status: Secondary | ICD-10-CM | POA: Diagnosis not present

## 2019-06-29 LAB — CBC WITH DIFFERENTIAL/PLATELET
Basophils Absolute: 0 10*3/uL (ref 0.0–0.1)
Basophils Relative: 0.6 % (ref 0.0–3.0)
Eosinophils Absolute: 0 10*3/uL (ref 0.0–0.7)
Eosinophils Relative: 1.1 % (ref 0.0–5.0)
HCT: 31.5 % — ABNORMAL LOW (ref 36.0–46.0)
Hemoglobin: 11.4 g/dL — ABNORMAL LOW (ref 12.0–15.0)
Lymphocytes Relative: 34.5 % (ref 12.0–46.0)
Lymphs Abs: 1.3 10*3/uL (ref 0.7–4.0)
MCHC: 35.9 g/dL (ref 30.0–36.0)
MCV: 90.1 fl (ref 78.0–100.0)
Monocytes Absolute: 0.3 10*3/uL (ref 0.1–1.0)
Monocytes Relative: 8.9 % (ref 3.0–12.0)
Neutro Abs: 2.1 10*3/uL (ref 1.4–7.7)
Neutrophils Relative %: 54.9 % (ref 43.0–77.0)
Platelets: 160 10*3/uL (ref 150.0–400.0)
RBC: 3.5 Mil/uL — ABNORMAL LOW (ref 3.87–5.11)
RDW: 13.2 % (ref 11.5–15.5)
WBC: 3.9 10*3/uL — ABNORMAL LOW (ref 4.0–10.5)

## 2019-06-29 LAB — COMPREHENSIVE METABOLIC PANEL
ALT: 7 U/L (ref 0–35)
AST: 9 U/L (ref 0–37)
Albumin: 4.1 g/dL (ref 3.5–5.2)
Alkaline Phosphatase: 63 U/L (ref 39–117)
BUN: 26 mg/dL — ABNORMAL HIGH (ref 6–23)
CO2: 25 mEq/L (ref 19–32)
Calcium: 8.7 mg/dL (ref 8.4–10.5)
Chloride: 106 mEq/L (ref 96–112)
Creatinine, Ser: 1.74 mg/dL — ABNORMAL HIGH (ref 0.40–1.20)
GFR: 29.78 mL/min — ABNORMAL LOW (ref >60.00)
Glucose, Bld: 107 mg/dL — ABNORMAL HIGH (ref 70–99)
Potassium: 4.5 mEq/L (ref 3.5–5.1)
Sodium: 139 mEq/L (ref 135–145)
Total Bilirubin: 0.5 mg/dL (ref 0.2–1.2)
Total Protein: 6 g/dL (ref 6.0–8.3)

## 2019-06-29 LAB — CORTISOL: Cortisol, Plasma: 1.5 ug/dL

## 2019-06-29 NOTE — Addendum Note (Signed)
Addended by: Caffie Pinto on: 06/29/2019 08:18 AM   Modules accepted: Orders

## 2019-06-30 ENCOUNTER — Encounter: Payer: Self-pay | Admitting: Family Medicine

## 2019-07-01 LAB — TACROLIMUS,HIGHLY SENSITIVE,LC/MS/MS: Tacrolimus Lvl: 5.1 mcg/L

## 2019-07-02 LAB — TACROLIMUS LEVEL

## 2019-07-02 LAB — ACTH: C206 ACTH: 7 pg/mL (ref 6–50)

## 2019-07-15 ENCOUNTER — Other Ambulatory Visit: Payer: Self-pay

## 2019-07-15 ENCOUNTER — Ambulatory Visit (INDEPENDENT_AMBULATORY_CARE_PROVIDER_SITE_OTHER): Payer: Managed Care, Other (non HMO)

## 2019-07-15 DIAGNOSIS — Z23 Encounter for immunization: Secondary | ICD-10-CM | POA: Diagnosis not present

## 2019-07-18 ENCOUNTER — Other Ambulatory Visit: Payer: Self-pay | Admitting: Family Medicine

## 2019-07-18 DIAGNOSIS — M792 Neuralgia and neuritis, unspecified: Secondary | ICD-10-CM

## 2019-09-03 ENCOUNTER — Telehealth: Payer: Self-pay

## 2019-09-03 DIAGNOSIS — Z944 Liver transplant status: Secondary | ICD-10-CM

## 2019-09-03 NOTE — Telephone Encounter (Signed)
Copied from [place] [id number]. Topic: General - Inquiry >> Sep 03, 2019  3:55 PM Oneta Rack wrote: Reason for CRM: Patient states their should be standing orders and would like to schedule lab appointment for Monday or Tuesday morning, please advise

## 2019-09-03 NOTE — Telephone Encounter (Signed)
Placed standing labs and scheduled patient appointment for Monday.

## 2019-09-06 ENCOUNTER — Other Ambulatory Visit: Payer: Managed Care, Other (non HMO)

## 2019-09-09 ENCOUNTER — Other Ambulatory Visit (INDEPENDENT_AMBULATORY_CARE_PROVIDER_SITE_OTHER): Payer: Managed Care, Other (non HMO)

## 2019-09-09 ENCOUNTER — Other Ambulatory Visit: Payer: Self-pay

## 2019-09-09 DIAGNOSIS — Z944 Liver transplant status: Secondary | ICD-10-CM | POA: Diagnosis not present

## 2019-09-09 LAB — COMPREHENSIVE METABOLIC PANEL
ALT: 6 U/L (ref 0–35)
AST: 10 U/L (ref 0–37)
Albumin: 4.1 g/dL (ref 3.5–5.2)
Alkaline Phosphatase: 59 U/L (ref 39–117)
BUN: 28 mg/dL — ABNORMAL HIGH (ref 6–23)
CO2: 26 mEq/L (ref 19–32)
Calcium: 8.7 mg/dL (ref 8.4–10.5)
Chloride: 104 mEq/L (ref 96–112)
Creatinine, Ser: 2.09 mg/dL — ABNORMAL HIGH (ref 0.40–1.20)
GFR: 24.09 mL/min — ABNORMAL LOW (ref >60.00)
Glucose, Bld: 179 mg/dL — ABNORMAL HIGH (ref 70–99)
Potassium: 4.1 mEq/L (ref 3.5–5.1)
Sodium: 138 mEq/L (ref 135–145)
Total Bilirubin: 0.5 mg/dL (ref 0.2–1.2)
Total Protein: 6.1 g/dL (ref 6.0–8.3)

## 2019-09-09 LAB — CBC WITH DIFFERENTIAL/PLATELET
Basophils Absolute: 0 10*3/uL (ref 0.0–0.1)
Basophils Relative: 0.5 % (ref 0.0–3.0)
Eosinophils Absolute: 0 10*3/uL (ref 0.0–0.7)
Eosinophils Relative: 1.4 % (ref 0.0–5.0)
HCT: 32.5 % — ABNORMAL LOW (ref 36.0–46.0)
Hemoglobin: 11.5 g/dL — ABNORMAL LOW (ref 12.0–15.0)
Lymphocytes Relative: 26.1 % (ref 12.0–46.0)
Lymphs Abs: 0.9 10*3/uL (ref 0.7–4.0)
MCHC: 35.5 g/dL (ref 30.0–36.0)
MCV: 91 fl (ref 78.0–100.0)
Monocytes Absolute: 0.3 10*3/uL (ref 0.1–1.0)
Monocytes Relative: 8.2 % (ref 3.0–12.0)
Neutro Abs: 2.3 10*3/uL (ref 1.4–7.7)
Neutrophils Relative %: 63.8 % (ref 43.0–77.0)
Platelets: 154 10*3/uL (ref 150.0–400.0)
RBC: 3.57 Mil/uL — ABNORMAL LOW (ref 3.87–5.11)
RDW: 13.5 % (ref 11.5–15.5)
WBC: 3.6 10*3/uL — ABNORMAL LOW (ref 4.0–10.5)

## 2019-09-09 LAB — MAGNESIUM: Magnesium: 1.9 mg/dL (ref 1.5–2.5)

## 2019-09-09 LAB — CORTISOL: Cortisol, Plasma: 2.3 ug/dL

## 2019-09-09 NOTE — Addendum Note (Signed)
Addended by: Kelle Darting A on: 09/09/2019 10:08 AM   Modules accepted: Orders

## 2019-09-10 ENCOUNTER — Encounter: Payer: Self-pay | Admitting: Family Medicine

## 2019-09-13 LAB — TACROLIMUS,HIGHLY SENSITIVE,LC/MS/MS: Tacrolimus Lvl: 3.9 mcg/L — ABNORMAL LOW

## 2019-09-13 LAB — ACTH: C206 ACTH: 12 pg/mL (ref 6–50)

## 2019-09-13 LAB — TACROLIMUS LEVEL

## 2019-09-22 ENCOUNTER — Encounter: Payer: Self-pay | Admitting: Family Medicine

## 2019-09-22 ENCOUNTER — Other Ambulatory Visit: Payer: Self-pay

## 2019-09-22 ENCOUNTER — Ambulatory Visit (INDEPENDENT_AMBULATORY_CARE_PROVIDER_SITE_OTHER): Payer: Managed Care, Other (non HMO) | Admitting: Family Medicine

## 2019-09-22 DIAGNOSIS — J0110 Acute frontal sinusitis, unspecified: Secondary | ICD-10-CM | POA: Diagnosis not present

## 2019-09-22 MED ORDER — DOXYCYCLINE HYCLATE 100 MG PO CAPS: 100.0000 mg | ORAL_CAPSULE | Freq: Two times a day (BID) | ORAL | 0 refills | Status: DC

## 2019-09-22 NOTE — Progress Notes (Signed)
[place] at [facility] [address] [place], [place] [address] [phone] [phone]  Date:  09/22/2019   Name:  Alicia [name]   DOB:  03-Feb-1961   MRN:  [id number]  PCP:  Darreld Mclean, MD    Chief Complaint: No chief complaint on file.   History of Present Illness:  Alicia [name] is a 58 y.o. 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-transplant done through the [facility] transplant program in February 2019 We will check labs to her regularly, I last saw her in the office in July for physical At that time I did refer her to rheumatology for arthritis symptoms in her hands  Patient location is home, provider location is home Patient identity confirmed with 2 factors, she gives consent for virtual visit today The patient and myself are on the call today  Reviewed recent lab work on her chart from earlier this month, renal insufficiency and mild leukopenia persist, platelets okay  Her husband Francee Piccolo recently had "a cold," and they began isolating from each other right away However a few days ago she also developed symptoms of a headache, sinus pain and pressure She also has noted a cough She has been sick for about 2 weeks No fever noted- she is checking her temp - max was 99.6- several days ago No vomiting or diarrhea except for as caused by her routine meds  She contacted her transplant team and they recommended delsym and mucinex She has not been tested for covid so far She feels like her sx are more in her sinuses than her chest  Her BP at home has been ok- 117/77, 113/74 recently  She is trying to eat- her appetite is not as good as normal but this is usual for her when ill  Normal smell and taste sensation  She is on NO prednisone right now, she is hydrocortisone instead- 15 mg am and 5 at night.   They are doing a very slow taper probably over a couple of years in hopes of getting her off steroids eventually   She used a neb treatment last night and slept "the best I have"  She is no longer on Dulera and spiriva, she is now on Trellagy   Patient Active Problem List   Diagnosis Date Noted  . Osteoporosis without current pathological fracture 05/25/2018  . Status post liver transplant ([place]) 12/25/2017  . Syncope 07/17/2017  . Other cirrhosis of liver ([place]) 05/10/2017  . Immunocompromised patient ([place]) 05/10/2017  . Mucous cyst of digit of left hand 04/11/2017  . Cellulitis, abdominal wall 03/01/2017  . Normocytic anemia 03/01/2017  . Hypokalemia 03/01/2017  . CKD (chronic kidney disease), stage III 03/01/2017  . Abdominal wall cellulitis 03/01/2017  . Adrenal insufficiency ([place]) 03/01/2017  . Hyperammonemia ([place])   . Cellulitis 06/28/2016  . Thrombocytopenia ([place]) 06/28/2016  . AKI (acute kidney injury) ([place]) 06/28/2016  . Multiple pulmonary nodules determined by computed tomography of lung   . Chronic cough 09/07/2015  . Hypertension, portal ([place]) 09/04/2011  . Asthma with chronic obstructive pulmonary disease (COPD) ([place]) 09/04/2011  . 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 GERD (gastroesophageal reflux disease) 07/04/2011  . Sarcoidosis 07/01/2011  . Asthma, severe persistent, with upper airway instability 06/13/2011  . 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  Allergy   . Anemia   . Asthma   . Cellulitis   . Cirrhosis ([place])   . GERD (gastroesophageal reflux disease)   . Hepatic fibrosis    congenital  . History of UTI    chronic as a child  . Sarcoidosis   . Spleen enlarged     Past Surgical History:  Procedure Laterality Date  . COLONOSCOPY    . CYST EXCISION Left 05/01/2017   Procedure: LEFT INDEX FINGER CYST EXCISION;  Surgeon: Leandrew Koyanagi, MD;  Location: [place];  Service: Orthopedics;  Laterality: Left;  .  ESOPHAGOGASTRODUODENOSCOPY    . LIVER TRANSPLANT    . SPINAL FUSION  12/2007   Double spinal Fusion  . VIDEO BRONCHOSCOPY Bilateral 12/08/2015   Procedure: VIDEO BRONCHOSCOPY WITH FLUORO;  Surgeon: Rigoberto Noel, MD;  Location: [place];  Service: Cardiopulmonary;  Laterality: Bilateral;    Social History   Tobacco Use  . Smoking status: Never Smoker  . Smokeless tobacco: Never Used  Substance Use Topics  . Alcohol use: No  . Drug use: No    Family History  Problem Relation Age of Onset  . Arthritis Mother   . Heart disease Father        Triple bypass  . Migraines Father   . Heart attack Paternal Grandmother   . Stroke Paternal Grandfather   . Liver disease Sister        Juvenile cysitc liver disease  . Asthma Sister     Allergies  Allergen Reactions  . Aspirin Other (See Comments)    Causes Asthma  . Peanut-Containing Drug Products Shortness Of Breath    Asthma, eye swelling, itchy eyes, can't catch her breath  . Gabapentin Other (See Comments)    Blisters in mouth, nausea   . Azithromycin Nausea And Vomiting    Medication list has been reviewed and updated.  Current Outpatient Medications on File Prior to Visit  Medication Sig Dispense Refill  . albuterol (PROVENTIL HFA;VENTOLIN HFA) 108 (90 Base) MCG/ACT inhaler Inhale 1-2 puffs into the lungs every 6 (six) hours as needed for wheezing or shortness of breath. 1 Inhaler 0  . amitriptyline (ELAVIL) 25 MG tablet TAKE 1 OR 2 TABLETS BY MOUTH AT BEDTIME FOR NERVE PAIN 180 tablet 3  . bumetanide (BUMEX) 1 MG tablet Take 1 mg by mouth daily.    . calcium citrate-vitamin D (CITRACAL+D) 315-200 MG-UNIT tablet Take 1 tablet by mouth 2 (two) times daily.    Marland Kitchen dipyridamole (PERSANTINE) 50 MG tablet Take 50 mg by mouth daily.    . DULERA 200-5 MCG/ACT AERO INHALE 2 PUFFS BY MOUTH TWO TIMES DAILY. 13 Inhaler 0  . ipratropium-albuterol (DUONEB) 0.5-2.5 (3) MG/3ML SOLN Take 3 mLs by nebulization every 4 (four) hours as  needed. 360 mL 0  . magnesium oxide (MAG-OX) 400 MG tablet Take 400 mg by mouth 2 (two) times daily.    . pantoprazole (PROTONIX) 40 MG tablet Take 40 mg by mouth daily.    . predniSONE (DELTASONE) 5 MG tablet Take 1 tablet (5 mg total) by mouth daily with breakfast.    . SPIRIVA RESPIMAT 2.5 MCG/ACT AERS Inhale 2 puffs into the lungs daily at 2 PM.   11  . tacrolimus (PROGRAF) 1 MG capsule Take 1 mg by mouth 2 (two) times a day. TAKE 2 TABLETS (3MG  TOTAL) IN THE MORNING AND THREE TABLETS (3MG  TOTAL) IN THE EVENING.    . traMADol (ULTRAM) 50 MG tablet Take 1  tablet (50 mg total) by mouth every 8 (eight) hours as needed. (Patient not taking: Reported on 04/08/2019) 12 tablet 0   No current facility-administered medications on file prior to visit.    Review of Systems:  As per HPI- otherwise negative.   Physical Examination: There were no vitals filed for this visit. There were no vitals filed for this visit. There is no height or weight on file to calculate BMI. Ideal Body Weight:    Pt observed over video monitor-  She looks well, of her normal self.  No cough, wheezing, shortness of breath is noted  Assessment and Plan: Acute non-recurrent frontal sinusitis - Plan: doxycycline (VIBRAMYCIN) 100 MG capsule  Virtual visit today with concern of illness, sinusitis with possible elements of bronchitis. I also encouraged the patient to be tested for COVID-19 given current pandemic, discussed options for testing.  She plans to pursue community testing ASAP  For the time being will start on doxycycline 100 mg twice a day for 10 days  I have called her transplant team at [facility] and left a detailed message for her transplant coordinator about our care plan and her symptoms.  I provided my cell phone in case they have any concerns or wish to make any changes  Signed Lamar Blinks, MD

## 2019-10-13 NOTE — Progress Notes (Addendum)
[place] [facility] [address], [place], [place] [address] [phone]  Date:  10/18/2019   Name:  Alicia [name]   DOB:  09/05/1961   MRN:  [id number]  PCP:  Darreld Mclean, MD    Chief Complaint: Lab Work (6 month follow up)   History of Present Illness:  Alicia [name] is a 59 y.o. 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  It is worrying her son, she would appreciate an x-ray Sx are in her right lower back only, not radiating to her legs No urinary symptoms No fever recently   Overall Malika continues to do very well and is grateful for her new lease on life.  She did some hiking over the summer and went up to 5 miles Her energy level is gradually improving   Patient Active Problem List   Diagnosis Date Noted  . Osteoporosis without current pathological fracture 05/25/2018  . Status post liver transplant ([place]) 12/25/2017  . Syncope 07/17/2017  . Other cirrhosis of liver ([place]) 05/10/2017  . Immunocompromised patient ([place]) 05/10/2017  . Mucous cyst of digit of left hand 04/11/2017  . Cellulitis, abdominal wall 03/01/2017  . Normocytic anemia 03/01/2017  . Hypokalemia 03/01/2017  . CKD (chronic kidney disease), stage III 03/01/2017  . Abdominal  wall cellulitis 03/01/2017  . Adrenal insufficiency ([place]) 03/01/2017  . Hyperammonemia ([place])   . Cellulitis 06/28/2016  . Thrombocytopenia ([place]) 06/28/2016  . AKI (acute kidney injury) ([place]) 06/28/2016  . Multiple pulmonary nodules determined by computed tomography of lung   . Chronic cough 09/07/2015  . Hypertension, portal ([place]) 09/04/2011  . Asthma with chronic obstructive pulmonary disease (COPD) ([place]) 09/04/2011  . 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 GERD (gastroesophageal reflux disease) 07/04/2011  . Sarcoidosis 07/01/2011  . Asthma, severe persistent, with upper airway instability 06/13/2011  . 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 Allergy   . Anemia   . Asthma   . Cellulitis   . Cirrhosis ([place])   . GERD (gastroesophageal reflux disease)   . Hepatic fibrosis    congenital  . History of UTI    chronic as a child  . Sarcoidosis   . Spleen enlarged     Past Surgical History:  Procedure Laterality Date  . COLONOSCOPY    . CYST EXCISION Left 05/01/2017   Procedure: LEFT INDEX FINGER CYST EXCISION;  Surgeon: Leandrew Koyanagi, MD;  Location: [place];  Service: Orthopedics;  Laterality: Left;  . ESOPHAGOGASTRODUODENOSCOPY    . LIVER TRANSPLANT    . SPINAL FUSION  12/2007   Double spinal Fusion  . VIDEO BRONCHOSCOPY Bilateral 12/08/2015   Procedure: VIDEO BRONCHOSCOPY WITH FLUORO;  Surgeon:  Rigoberto Noel, MD;  Location: [facility];  Service: Cardiopulmonary;  Laterality: Bilateral;    Social History   Tobacco Use  . Smoking status: Never Smoker  . Smokeless tobacco: Never Used  Substance Use Topics  . Alcohol use: No  . Drug use: No    Family History  Problem Relation Age of Onset  . Arthritis Mother   . Heart disease Father        Triple bypass  . Migraines Father   . Heart attack Paternal Grandmother   . Stroke Paternal Grandfather   . Liver disease Sister        Juvenile cysitc liver disease  . Asthma Sister      Allergies  Allergen Reactions  . Aspirin Other (See Comments)    Causes Asthma  . Peanut-Containing Drug Products Shortness Of Breath    Asthma, eye swelling, itchy eyes, can't catch her breath  . Gabapentin Other (See Comments)    Blisters in mouth, nausea   . Statins     Asthma, eye swelling, itchy eyes, can't catch her breath  . Azithromycin Nausea And Vomiting    Medication list has been reviewed and updated.  Current Outpatient Medications on File Prior to Visit  Medication Sig Dispense Refill  . albuterol (PROVENTIL HFA;VENTOLIN HFA) 108 (90 Base) MCG/ACT inhaler Inhale 1-2 puffs into the lungs every 6 (six) hours as needed for wheezing or shortness of breath. 1 Inhaler 0  . amitriptyline (ELAVIL) 25 MG tablet TAKE 1 OR 2 TABLETS BY MOUTH AT BEDTIME FOR NERVE PAIN 180 tablet 3  . bumetanide (BUMEX) 1 MG tablet Take 1 mg by mouth daily.    . calcium citrate-vitamin D (CITRACAL+D) 315-200 MG-UNIT tablet Take 1 tablet by mouth 2 (two) times daily.    Marland Kitchen dipyridamole (PERSANTINE) 50 MG tablet Take 50 mg by mouth daily.    Marland Kitchen doxycycline (VIBRAMYCIN) 100 MG capsule Take 1 capsule (100 mg total) by mouth 2 (two) times daily. 20 capsule 0  . DULERA 200-5 MCG/ACT AERO INHALE 2 PUFFS BY MOUTH TWO TIMES DAILY. 13 Inhaler 0  . ipratropium-albuterol (DUONEB) 0.5-2.5 (3) MG/3ML SOLN Take 3 mLs by nebulization every 4 (four) hours as needed. 360 mL 0  . magnesium oxide (MAG-OX) 400 MG tablet Take 400 mg by mouth 2 (two) times daily.    . pantoprazole (PROTONIX) 40 MG tablet Take 40 mg by mouth daily.    . predniSONE (DELTASONE) 5 MG tablet Take 1 tablet (5 mg total) by mouth daily with breakfast.    . SPIRIVA RESPIMAT 2.5 MCG/ACT AERS Inhale 2 puffs into the lungs daily at 2 PM.   11  . tacrolimus (PROGRAF) 1 MG capsule Take 1 mg by mouth 2 (two) times a day. TAKE 2 TABLETS (3MG  TOTAL) IN THE MORNING AND THREE TABLETS (3MG  TOTAL) IN THE EVENING.    . traMADol (ULTRAM) 50 MG tablet Take 1  tablet (50 mg total) by mouth every 8 (eight) hours as needed. 12 tablet 0   No current facility-administered medications on file prior to visit.    Review of Systems:  As per HPI- otherwise negative.   Physical Examination: Vitals:   10/18/19 0855  BP: 128/82  Pulse: 77  Resp: 16  Temp: (!) 97.1 F (36.2 C)  SpO2: 99%   Vitals:   10/18/19 0855  Weight: 163 lb (73.9 kg)  Height: 5\' 2"  (1.575 m)   Body mass index is 29.81 kg/m. Ideal Body Weight: Weight in (lb)  to have BMI = 25: 136.4  GEN: WDWN, NAD, Non-toxic, A & O x 3, overweight, looks well HEENT: Atraumatic, Normocephalic. Neck supple. No masses, No LAD. Ears and Nose: No external deformity. CV: RRR, No M/G/R. No JVD. No thrill. No extra heart sounds. PULM: CTA B, no wheezes, crackles, rhonchi. No retractions. No resp. distress. No accessory muscle use. ABD: S, NT, ND, +BS. No rebound. No HSM.  Large abdominal scar is well-healed, abdomen nontender She has some tenderness over the right posterior inferior ribs.  She notes this is the location of her recent pain EXTR: No c/c/e NEURO Normal gait.  PSYCH: Normally interactive. Conversant. Not depressed or anxious appearing.  Calm demeanor.    Assessment and Plan: Liver transplant recipient [facility]) - Plan: CBC, Comprehensive metabolic panel, Magnesium, Cortisol, ACTH, Tacrolimus level  Encounter for screening mammogram for malignant neoplasm of breast - Plan: MM 3D SCREEN BREAST BILATERAL  Screening for hyperlipidemia - Plan: Lipid panel  Screening for diabetes mellitus - Plan: Hemoglobin A1c  Right flank pain - Plan: Urine culture  Rib pain on right side - Plan: DG Chest 2 View, DG Ribs Unilateral Right  Periodic follow-up visit today Due for mammogram Ordered films of her right ribs and chest-suspect she has some musculoskeletal pain due to recent persistent cough Ordered her routine labs, will send to do transplant team once available Will plan further  follow- up pending labs and x-rays  This visit occurred during the SARS-CoV-2 public health emergency.  Safety protocols were in place, including screening questions prior to the visit, additional usage of staff PPE, and extensive cleaning of exam room while observing appropriate contact time as indicated for disinfecting solutions.   Moderate medical decision making today  Signed Lamar Blinks, MD Received her x-rays and sent message with results  DG Chest 2 View  Result Date: 10/18/2019 CLINICAL DATA:  59 year old with recent cough and RIGHT LOWER rib pain. Current history of sarcoidosis. EXAM: CHEST - 2 VIEW COMPARISON:  03/05/2017 and earlier, including CTA chest 10/19/2016. FINDINGS: Cardiomediastinal silhouette unremarkable and unchanged. The multiple nodules throughout both lungs identified on the prior CT in 2018 are much less conspicuous on the chest x-ray, though a nodule in the RIGHT UPPER LOBE is visible. Mild diffuse chronic interstitial disease is unchanged. No new pulmonary parenchymal abnormalities in either lung. No pleural effusions. Normal pulmonary vascularity. Visualized bony thorax unremarkable. IMPRESSION: 1. No acute cardiopulmonary disease. 2. The multiple nodules throughout both lungs identified on the prior CT in 2018 are much less conspicuous on the chest x-ray, though a nodule in the RIGHT UPPER LOBE is visible. This is related to the patient's known sarcoidosis. Electronically Signed   By: Evangeline Dakin M.D.   On: 10/18/2019 10:04   DG Ribs Unilateral Right  Result Date: 10/18/2019 CLINICAL DATA:  59 year old with recent cough and RIGHT LOWER rib pain. Current history of sarcoidosis. EXAM: RIGHT RIBS - 2 VIEW COMPARISON:  No prior rib imaging. Bone window images from CT chest 10/17/2016. FINDINGS: The site of maximum pain and tenderness was marked with a small metallic BB. No underlying RIGHT rib fracture. No intrinsic osseous abnormalities apart from possible  osseous demineralization. IMPRESSION: 1. No RIGHT rib fracture identified. 2. Possible osseous demineralization. Electronically Signed   By: Evangeline Dakin M.D.   On: 10/18/2019 10:06    Received some of her labs so far, message to patient Leukopenia is slightly worse, as is anemia Kidney function is improved Results for orders placed or  performed in visit on 10/18/19  CBC  Result Value Ref Range   WBC 3.1 (L) 4.0 - 10.5 K/uL   RBC 3.43 (L) 3.87 - 5.11 Mil/uL   Platelets 154.0 150.0 - 400.0 K/uL   Hemoglobin 10.8 (L) 12.0 - 15.0 g/dL   HCT 30.8 (L) 36.0 - 46.0 %   MCV 89.9 78.0 - 100.0 fl   MCHC 35.2 30.0 - 36.0 g/dL   RDW 14.4 11.5 - 15.5 %  Comprehensive metabolic panel  Result Value Ref Range   Sodium 139 135 - 145 mEq/L   Potassium 4.6 3.5 - 5.1 mEq/L   Chloride 107 96 - 112 mEq/L   CO2 26 19 - 32 mEq/L   Glucose, Bld 98 70 - 99 mg/dL   BUN 23 6 - 23 mg/dL   Creatinine, Ser 1.66 (H) 0.40 - 1.20 mg/dL   Total Bilirubin 0.4 0.2 - 1.2 mg/dL   Alkaline Phosphatase 59 39 - 117 U/L   AST 10 0 - 37 U/L   ALT 6 0 - 35 U/L   Total Protein 5.9 (L) 6.0 - 8.3 g/dL   Albumin 3.7 3.5 - 5.2 g/dL   GFR 31.41 (L) >60.00 mL/min   Calcium 8.3 (L) 8.4 - 10.5 mg/dL  Hemoglobin A1c  Result Value Ref Range   Hgb A1c MFr Bld 5.3 4.6 - 6.5 %  Lipid panel  Result Value Ref Range   Cholesterol 155 0 - 200 mg/dL   Triglycerides 57.0 0.0 - 149.0 mg/dL   HDL 51.00 >39.00 mg/dL   VLDL 11.4 0.0 - 40.0 mg/dL   LDL Cholesterol 93 0 - 99 mg/dL   Total CHOL/HDL Ratio 3    NonHDL 104.47   Magnesium  Result Value Ref Range   Magnesium 1.6 1.5 - 2.5 mg/dL  Cortisol  Result Value Ref Range   Cortisol, Plasma 1.8 ug/dL

## 2019-10-15 ENCOUNTER — Other Ambulatory Visit: Payer: Self-pay

## 2019-10-18 ENCOUNTER — Other Ambulatory Visit: Payer: Self-pay

## 2019-10-18 ENCOUNTER — Encounter (HOSPITAL_BASED_OUTPATIENT_CLINIC_OR_DEPARTMENT_OTHER): Payer: Self-pay

## 2019-10-18 ENCOUNTER — Ambulatory Visit (HOSPITAL_BASED_OUTPATIENT_CLINIC_OR_DEPARTMENT_OTHER)
Admission: RE | Admit: 2019-10-18 | Discharge: 2019-10-18 | Disposition: A | Discharge status: Home / Self Care | Payer: Managed Care, Other (non HMO) | Source: Ambulatory Visit | Attending: Family Medicine | Admitting: Family Medicine

## 2019-10-18 ENCOUNTER — Encounter: Payer: Self-pay | Admitting: Family Medicine

## 2019-10-18 ENCOUNTER — Ambulatory Visit: Payer: Managed Care, Other (non HMO) | Admitting: Family Medicine

## 2019-10-18 VITALS — BP 128/82 | HR 77 | Temp 97.1°F | Resp 16 | Ht 62.0 in | Wt 163.0 lb

## 2019-10-18 DIAGNOSIS — Z1231 Encounter for screening mammogram for malignant neoplasm of breast: Secondary | ICD-10-CM | POA: Insufficient documentation

## 2019-10-18 DIAGNOSIS — Z13220 Encounter for screening for lipoid disorders: Secondary | ICD-10-CM | POA: Diagnosis not present

## 2019-10-18 DIAGNOSIS — Z131 Encounter for screening for diabetes mellitus: Secondary | ICD-10-CM

## 2019-10-18 DIAGNOSIS — R0781 Pleurodynia: Secondary | ICD-10-CM | POA: Diagnosis not present

## 2019-10-18 DIAGNOSIS — R109 Unspecified abdominal pain: Secondary | ICD-10-CM

## 2019-10-18 DIAGNOSIS — Z944 Liver transplant status: Secondary | ICD-10-CM | POA: Diagnosis not present

## 2019-10-18 LAB — LIPID PANEL
Cholesterol: 155 mg/dL (ref 0–200)
HDL: 51 mg/dL (ref >39.00)
LDL Cholesterol: 93 mg/dL (ref 0–99)
NonHDL: 104.47
Total CHOL/HDL Ratio: 3
Triglycerides: 57 mg/dL (ref 0.0–149.0)
VLDL: 11.4 mg/dL (ref 0.0–40.0)

## 2019-10-18 LAB — HEMOGLOBIN A1C: Hgb A1c MFr Bld: 5.3 % (ref 4.6–6.5)

## 2019-10-18 LAB — COMPREHENSIVE METABOLIC PANEL
ALT: 6 U/L (ref 0–35)
AST: 10 U/L (ref 0–37)
Albumin: 3.7 g/dL (ref 3.5–5.2)
Alkaline Phosphatase: 59 U/L (ref 39–117)
BUN: 23 mg/dL (ref 6–23)
CO2: 26 mEq/L (ref 19–32)
Calcium: 8.3 mg/dL — ABNORMAL LOW (ref 8.4–10.5)
Chloride: 107 mEq/L (ref 96–112)
Creatinine, Ser: 1.66 mg/dL — ABNORMAL HIGH (ref 0.40–1.20)
GFR: 31.41 mL/min — ABNORMAL LOW (ref >60.00)
Glucose, Bld: 98 mg/dL (ref 70–99)
Potassium: 4.6 mEq/L (ref 3.5–5.1)
Sodium: 139 mEq/L (ref 135–145)
Total Bilirubin: 0.4 mg/dL (ref 0.2–1.2)
Total Protein: 5.9 g/dL — ABNORMAL LOW (ref 6.0–8.3)

## 2019-10-18 LAB — CBC
HCT: 30.8 % — ABNORMAL LOW (ref 36.0–46.0)
Hemoglobin: 10.8 g/dL — ABNORMAL LOW (ref 12.0–15.0)
MCHC: 35.2 g/dL (ref 30.0–36.0)
MCV: 89.9 fl (ref 78.0–100.0)
Platelets: 154 10*3/uL (ref 150.0–400.0)
RBC: 3.43 Mil/uL — ABNORMAL LOW (ref 3.87–5.11)
RDW: 14.4 % (ref 11.5–15.5)
WBC: 3.1 10*3/uL — ABNORMAL LOW (ref 4.0–10.5)

## 2019-10-18 LAB — CORTISOL: Cortisol, Plasma: 1.8 ug/dL

## 2019-10-18 LAB — MAGNESIUM: Magnesium: 1.6 mg/dL (ref 1.5–2.5)

## 2019-10-18 NOTE — Patient Instructions (Addendum)
It was great to see you again today as always I will be in touch with your labs and x-rays asap We will check on your urine culture as well I will send your labs to [facility] once they come in  Please get your mammogram asap

## 2019-10-19 LAB — URINE CULTURE
MICRO NUMBER:: 10052248
SPECIMEN QUALITY:: ADEQUATE

## 2019-10-20 LAB — TACROLIMUS LEVEL: Tacrolimus (FK506), Blood: 5 ng/mL

## 2019-10-20 LAB — ACTH: C206 ACTH: <5 pg/mL — ABNORMAL LOW (ref 6–50)

## 2019-10-20 NOTE — Progress Notes (Signed)
Results faxed.

## 2020-01-21 ENCOUNTER — Encounter: Payer: Self-pay | Admitting: Family Medicine

## 2020-01-21 DIAGNOSIS — Z944 Liver transplant status: Secondary | ICD-10-CM

## 2020-01-26 ENCOUNTER — Other Ambulatory Visit: Payer: Self-pay

## 2020-01-26 ENCOUNTER — Other Ambulatory Visit (INDEPENDENT_AMBULATORY_CARE_PROVIDER_SITE_OTHER): Payer: Managed Care, Other (non HMO)

## 2020-01-26 DIAGNOSIS — Z944 Liver transplant status: Secondary | ICD-10-CM | POA: Diagnosis not present

## 2020-01-26 LAB — COMPREHENSIVE METABOLIC PANEL
ALT: 7 U/L (ref 0–35)
AST: 14 U/L (ref 0–37)
Albumin: 4.2 g/dL (ref 3.5–5.2)
Alkaline Phosphatase: 55 U/L (ref 39–117)
BUN: 22 mg/dL (ref 6–23)
CO2: 28 mEq/L (ref 19–32)
Calcium: 9.2 mg/dL (ref 8.4–10.5)
Chloride: 107 mEq/L (ref 96–112)
Creatinine, Ser: 1.82 mg/dL — ABNORMAL HIGH (ref 0.40–1.20)
GFR: 28.22 mL/min — ABNORMAL LOW (ref >60.00)
Glucose, Bld: 108 mg/dL — ABNORMAL HIGH (ref 70–99)
Potassium: 4.7 mEq/L (ref 3.5–5.1)
Sodium: 141 mEq/L (ref 135–145)
Total Bilirubin: 0.4 mg/dL (ref 0.2–1.2)
Total Protein: 6.3 g/dL (ref 6.0–8.3)

## 2020-01-26 LAB — CORTISOL: Cortisol, Plasma: 4.1 ug/dL

## 2020-01-26 LAB — MAGNESIUM: Magnesium: 2 mg/dL (ref 1.5–2.5)

## 2020-01-27 LAB — CBC WITH DIFFERENTIAL/PLATELET
Basophils Absolute: 0 10*3/uL (ref 0.0–0.1)
Basophils Relative: 0.5 % (ref 0.0–3.0)
Eosinophils Absolute: 0 10*3/uL (ref 0.0–0.7)
Eosinophils Relative: 0.8 % (ref 0.0–5.0)
HCT: 33.2 % — ABNORMAL LOW (ref 36.0–46.0)
Hemoglobin: 11.6 g/dL — ABNORMAL LOW (ref 12.0–15.0)
Lymphocytes Relative: 38.1 % (ref 12.0–46.0)
Lymphs Abs: 1.5 10*3/uL (ref 0.7–4.0)
MCHC: 35 g/dL (ref 30.0–36.0)
MCV: 90.6 fl (ref 78.0–100.0)
Monocytes Absolute: 0.4 10*3/uL (ref 0.1–1.0)
Monocytes Relative: 9.7 % (ref 3.0–12.0)
Neutro Abs: 2 10*3/uL (ref 1.4–7.7)
Neutrophils Relative %: 50.9 % (ref 43.0–77.0)
Platelets: 179 10*3/uL (ref 150.0–400.0)
RBC: 3.66 Mil/uL — ABNORMAL LOW (ref 3.87–5.11)
RDW: 13.9 % (ref 11.5–15.5)
WBC: 3.9 10*3/uL — ABNORMAL LOW (ref 4.0–10.5)

## 2020-01-28 LAB — TACROLIMUS,HIGHLY SENSITIVE,LC/MS/MS: Tacrolimus Lvl: 3.7 mcg/L — ABNORMAL LOW

## 2020-01-29 ENCOUNTER — Telehealth: Payer: Self-pay | Admitting: Family Medicine

## 2020-01-29 NOTE — Telephone Encounter (Signed)
Thank you :)

## 2020-01-29 NOTE — Telephone Encounter (Signed)
Nursing services called reporting [facility] reports a Tacrolimus level of 3.7

## 2020-03-27 DIAGNOSIS — Z944 Liver transplant status: Secondary | ICD-10-CM | POA: Diagnosis not present

## 2020-03-27 DIAGNOSIS — D849 Immunodeficiency, unspecified: Secondary | ICD-10-CM | POA: Diagnosis not present

## 2020-03-27 DIAGNOSIS — E213 Hyperparathyroidism, unspecified: Secondary | ICD-10-CM | POA: Diagnosis not present

## 2020-03-30 DIAGNOSIS — D225 Melanocytic nevi of trunk: Secondary | ICD-10-CM | POA: Diagnosis not present

## 2020-03-30 DIAGNOSIS — Z86018 Personal history of other benign neoplasm: Secondary | ICD-10-CM | POA: Diagnosis not present

## 2020-03-30 DIAGNOSIS — L821 Other seborrheic keratosis: Secondary | ICD-10-CM | POA: Diagnosis not present

## 2020-03-30 DIAGNOSIS — D2262 Melanocytic nevi of left upper limb, including shoulder: Secondary | ICD-10-CM | POA: Diagnosis not present

## 2020-05-08 DIAGNOSIS — R918 Other nonspecific abnormal finding of lung field: Secondary | ICD-10-CM | POA: Diagnosis not present

## 2020-05-08 DIAGNOSIS — J90 Pleural effusion, not elsewhere classified: Secondary | ICD-10-CM | POA: Diagnosis not present

## 2020-05-08 DIAGNOSIS — D860 Sarcoidosis of lung: Secondary | ICD-10-CM | POA: Diagnosis not present

## 2020-05-08 DIAGNOSIS — J3089 Other allergic rhinitis: Secondary | ICD-10-CM | POA: Diagnosis not present

## 2020-05-30 DIAGNOSIS — T781XXA Other adverse food reactions, not elsewhere classified, initial encounter: Secondary | ICD-10-CM | POA: Diagnosis not present

## 2020-05-30 DIAGNOSIS — I509 Heart failure, unspecified: Secondary | ICD-10-CM | POA: Diagnosis not present

## 2020-05-30 DIAGNOSIS — N183 Chronic kidney disease, stage 3 unspecified: Secondary | ICD-10-CM | POA: Diagnosis not present

## 2020-05-30 DIAGNOSIS — I130 Hypertensive heart and chronic kidney disease with heart failure and stage 1 through stage 4 chronic kidney disease, or unspecified chronic kidney disease: Secondary | ICD-10-CM | POA: Diagnosis not present

## 2020-05-30 DIAGNOSIS — J309 Allergic rhinitis, unspecified: Secondary | ICD-10-CM | POA: Diagnosis not present

## 2020-05-30 DIAGNOSIS — J4540 Moderate persistent asthma, uncomplicated: Secondary | ICD-10-CM | POA: Diagnosis not present

## 2020-05-30 DIAGNOSIS — Z86718 Personal history of other venous thrombosis and embolism: Secondary | ICD-10-CM | POA: Diagnosis not present

## 2020-05-30 DIAGNOSIS — Z944 Liver transplant status: Secondary | ICD-10-CM | POA: Diagnosis not present

## 2020-05-31 ENCOUNTER — Encounter: Payer: Self-pay | Admitting: Family Medicine

## 2020-05-31 DIAGNOSIS — Z944 Liver transplant status: Secondary | ICD-10-CM

## 2020-06-21 ENCOUNTER — Telehealth: Payer: Self-pay | Admitting: Family Medicine

## 2020-06-21 ENCOUNTER — Other Ambulatory Visit: Payer: Self-pay

## 2020-06-21 ENCOUNTER — Other Ambulatory Visit: Payer: Medicare Other

## 2020-06-21 DIAGNOSIS — Z944 Liver transplant status: Secondary | ICD-10-CM | POA: Diagnosis not present

## 2020-06-21 DIAGNOSIS — M810 Age-related osteoporosis without current pathological fracture: Secondary | ICD-10-CM

## 2020-06-21 NOTE — Telephone Encounter (Signed)
Caller name: Nora Call back number: [phone]  Patient would like to get her bone density. Can you please add an order.

## 2020-06-21 NOTE — Telephone Encounter (Signed)
Bone density has been ordered.

## 2020-06-22 LAB — CBC WITH DIFFERENTIAL/PLATELET
Absolute Monocytes: 398 cells/uL (ref 200–950)
Basophils Absolute: 20 cells/uL (ref 0–200)
Basophils Relative: 0.5 %
Eosinophils Absolute: 51 cells/uL (ref 15–500)
Eosinophils Relative: 1.3 %
HCT: 35.1 % (ref 35.0–45.0)
Hemoglobin: 12 g/dL (ref 11.7–15.5)
Lymphs Abs: 1494 cells/uL (ref 850–3900)
MCH: 30.6 pg (ref 27.0–33.0)
MCHC: 34.2 g/dL (ref 32.0–36.0)
MCV: 89.5 fL (ref 80.0–100.0)
MPV: 10.8 fL (ref 7.5–12.5)
Monocytes Relative: 10.2 %
Neutro Abs: 1938 cells/uL (ref 1500–7800)
Neutrophils Relative %: 49.7 %
Platelets: 184 10*3/uL (ref 140–400)
RBC: 3.92 10*6/uL (ref 3.80–5.10)
RDW: 13.1 % (ref 11.0–15.0)
Total Lymphocyte: 38.3 %
WBC: 3.9 10*3/uL (ref 3.8–10.8)

## 2020-06-22 LAB — COMPREHENSIVE METABOLIC PANEL
AG Ratio: 1.9 (calc) (ref 1.0–2.5)
ALT: 6 U/L (ref 6–29)
AST: 10 U/L (ref 10–35)
Albumin: 4.4 g/dL (ref 3.6–5.1)
Alkaline phosphatase (APISO): 52 U/L (ref 37–153)
BUN/Creatinine Ratio: 14 (calc) (ref 6–22)
BUN: 26 mg/dL — ABNORMAL HIGH (ref 7–25)
CO2: 27 mmol/L (ref 20–32)
Calcium: 9.5 mg/dL (ref 8.6–10.4)
Chloride: 105 mmol/L (ref 98–110)
Creat: 1.8 mg/dL — ABNORMAL HIGH (ref 0.50–0.99)
Globulin: 2.3 g/dL (calc) (ref 1.9–3.7)
Glucose, Bld: 99 mg/dL (ref 65–99)
Potassium: 4.8 mmol/L (ref 3.5–5.3)
Sodium: 138 mmol/L (ref 135–146)
Total Bilirubin: 0.5 mg/dL (ref 0.2–1.2)
Total Protein: 6.7 g/dL (ref 6.1–8.1)

## 2020-06-22 LAB — CORTISOL: Cortisol, Plasma: 2.4 ug/dL — ABNORMAL LOW

## 2020-06-22 LAB — TACROLIMUS LEVEL: Tacrolimus (FK506), Blood: 5.2 ng/mL

## 2020-06-26 LAB — ACTH: C206 ACTH: 16 pg/mL (ref 6–50)

## 2020-06-27 DIAGNOSIS — E2740 Unspecified adrenocortical insufficiency: Secondary | ICD-10-CM | POA: Diagnosis not present

## 2020-06-27 DIAGNOSIS — Z79899 Other long term (current) drug therapy: Secondary | ICD-10-CM | POA: Diagnosis not present

## 2020-06-30 ENCOUNTER — Encounter: Payer: Self-pay | Admitting: Family Medicine

## 2020-06-30 DIAGNOSIS — Z944 Liver transplant status: Secondary | ICD-10-CM

## 2020-07-04 ENCOUNTER — Other Ambulatory Visit: Payer: Self-pay | Admitting: Family Medicine

## 2020-07-04 DIAGNOSIS — M792 Neuralgia and neuritis, unspecified: Secondary | ICD-10-CM

## 2020-07-10 ENCOUNTER — Other Ambulatory Visit (HOSPITAL_BASED_OUTPATIENT_CLINIC_OR_DEPARTMENT_OTHER): Payer: Medicare Other

## 2020-07-10 DIAGNOSIS — I10 Essential (primary) hypertension: Secondary | ICD-10-CM | POA: Diagnosis not present

## 2020-07-10 DIAGNOSIS — N1832 Chronic kidney disease, stage 3b: Secondary | ICD-10-CM | POA: Diagnosis not present

## 2020-07-10 DIAGNOSIS — N141 Nephropathy induced by other drugs, medicaments and biological substances: Secondary | ICD-10-CM | POA: Diagnosis not present

## 2020-07-10 DIAGNOSIS — T451X5A Adverse effect of antineoplastic and immunosuppressive drugs, initial encounter: Secondary | ICD-10-CM | POA: Diagnosis not present

## 2020-07-10 DIAGNOSIS — E213 Hyperparathyroidism, unspecified: Secondary | ICD-10-CM | POA: Diagnosis not present

## 2020-07-10 DIAGNOSIS — D849 Immunodeficiency, unspecified: Secondary | ICD-10-CM | POA: Diagnosis not present

## 2020-07-10 DIAGNOSIS — Z944 Liver transplant status: Secondary | ICD-10-CM | POA: Diagnosis not present

## 2020-07-12 ENCOUNTER — Other Ambulatory Visit: Payer: Self-pay

## 2020-07-12 ENCOUNTER — Other Ambulatory Visit (INDEPENDENT_AMBULATORY_CARE_PROVIDER_SITE_OTHER): Payer: Medicare Other

## 2020-07-12 DIAGNOSIS — Z944 Liver transplant status: Secondary | ICD-10-CM

## 2020-07-12 NOTE — Addendum Note (Signed)
Addended by: Kelle Darting A on: 07/12/2020 08:19 AM   Modules accepted: Orders

## 2020-07-13 LAB — COMPREHENSIVE METABOLIC PANEL
AG Ratio: 1.8 (calc) (ref 1.0–2.5)
ALT: 7 U/L (ref 6–29)
AST: 10 U/L (ref 10–35)
Albumin: 4 g/dL (ref 3.6–5.1)
Alkaline phosphatase (APISO): 52 U/L (ref 37–153)
BUN/Creatinine Ratio: 13 (calc) (ref 6–22)
BUN: 21 mg/dL (ref 7–25)
CO2: 25 mmol/L (ref 20–32)
Calcium: 8.8 mg/dL (ref 8.6–10.4)
Chloride: 106 mmol/L (ref 98–110)
Creat: 1.61 mg/dL — ABNORMAL HIGH (ref 0.50–0.99)
Globulin: 2.2 g/dL (calc) (ref 1.9–3.7)
Glucose, Bld: 138 mg/dL — ABNORMAL HIGH (ref 65–99)
Potassium: 4.6 mmol/L (ref 3.5–5.3)
Sodium: 139 mmol/L (ref 135–146)
Total Bilirubin: 0.4 mg/dL (ref 0.2–1.2)
Total Protein: 6.2 g/dL (ref 6.1–8.1)

## 2020-07-13 LAB — CBC
HCT: 33.8 % — ABNORMAL LOW (ref 35.0–45.0)
Hemoglobin: 11.7 g/dL (ref 11.7–15.5)
MCH: 31 pg (ref 27.0–33.0)
MCHC: 34.6 g/dL (ref 32.0–36.0)
MCV: 89.7 fL (ref 80.0–100.0)
MPV: 10.9 fL (ref 7.5–12.5)
Platelets: 185 10*3/uL (ref 140–400)
RBC: 3.77 10*6/uL — ABNORMAL LOW (ref 3.80–5.10)
RDW: 13 % (ref 11.0–15.0)
WBC: 4.3 10*3/uL (ref 3.8–10.8)

## 2020-07-13 LAB — CORTISOL: Cortisol, Plasma: 3.7 ug/dL

## 2020-07-14 ENCOUNTER — Other Ambulatory Visit: Payer: Self-pay

## 2020-07-14 ENCOUNTER — Ambulatory Visit (HOSPITAL_BASED_OUTPATIENT_CLINIC_OR_DEPARTMENT_OTHER)
Admission: RE | Admit: 2020-07-14 | Discharge: 2020-07-14 | Disposition: A | Discharge status: Home / Self Care | Payer: Medicare Other | Source: Ambulatory Visit | Attending: Family Medicine | Admitting: Family Medicine

## 2020-07-14 DIAGNOSIS — M810 Age-related osteoporosis without current pathological fracture: Secondary | ICD-10-CM | POA: Diagnosis not present

## 2020-07-14 DIAGNOSIS — Z780 Asymptomatic menopausal state: Secondary | ICD-10-CM | POA: Diagnosis not present

## 2020-07-14 DIAGNOSIS — M8588 Other specified disorders of bone density and structure, other site: Secondary | ICD-10-CM | POA: Diagnosis not present

## 2020-07-14 DIAGNOSIS — Z87828 Personal history of other (healed) physical injury and trauma: Secondary | ICD-10-CM | POA: Diagnosis not present

## 2020-07-14 LAB — TACROLIMUS,HIGHLY SENSITIVE,LC/MS/MS: Tacrolimus Lvl: 5.2 mcg/L

## 2020-07-15 ENCOUNTER — Other Ambulatory Visit: Payer: Self-pay | Admitting: Family Medicine

## 2020-07-15 ENCOUNTER — Encounter: Payer: Self-pay | Admitting: Family Medicine

## 2020-07-17 LAB — ACTH: C206 ACTH: 13 pg/mL (ref 6–50)

## 2020-07-17 NOTE — Telephone Encounter (Signed)
Results have been sent to Dr. Paticia Stack

## 2020-10-09 DIAGNOSIS — D849 Immunodeficiency, unspecified: Secondary | ICD-10-CM | POA: Diagnosis not present

## 2020-10-09 DIAGNOSIS — Z944 Liver transplant status: Secondary | ICD-10-CM | POA: Diagnosis not present

## 2020-10-11 ENCOUNTER — Encounter: Payer: Self-pay | Admitting: Family Medicine

## 2020-10-11 DIAGNOSIS — Z944 Liver transplant status: Secondary | ICD-10-CM

## 2020-10-18 NOTE — Addendum Note (Signed)
Addended by: Kelle Darting A on: 10/18/2020 03:28 PM   Modules accepted: Orders

## 2020-10-19 ENCOUNTER — Other Ambulatory Visit: Payer: Self-pay

## 2020-10-19 ENCOUNTER — Other Ambulatory Visit (INDEPENDENT_AMBULATORY_CARE_PROVIDER_SITE_OTHER): Payer: Medicare Other

## 2020-10-19 DIAGNOSIS — Z944 Liver transplant status: Secondary | ICD-10-CM | POA: Diagnosis not present

## 2020-10-19 LAB — COMPREHENSIVE METABOLIC PANEL
ALT: 5 U/L (ref 0–35)
AST: 8 U/L (ref 0–37)
Albumin: 4.2 g/dL (ref 3.5–5.2)
Alkaline Phosphatase: 53 U/L (ref 39–117)
BUN: 27 mg/dL — ABNORMAL HIGH (ref 6–23)
CO2: 27 mEq/L (ref 19–32)
Calcium: 9 mg/dL (ref 8.4–10.5)
Chloride: 105 mEq/L (ref 96–112)
Creatinine, Ser: 1.88 mg/dL — ABNORMAL HIGH (ref 0.40–1.20)
GFR: 28.42 mL/min — ABNORMAL LOW (ref >60.00)
Glucose, Bld: 120 mg/dL — ABNORMAL HIGH (ref 70–99)
Potassium: 4.4 mEq/L (ref 3.5–5.1)
Sodium: 139 mEq/L (ref 135–145)
Total Bilirubin: 0.4 mg/dL (ref 0.2–1.2)
Total Protein: 6.2 g/dL (ref 6.0–8.3)

## 2020-10-19 LAB — CORTISOL: Cortisol, Plasma: 4.7 ug/dL

## 2020-10-19 LAB — CBC WITH DIFFERENTIAL/PLATELET
Basophils Absolute: 0 10*3/uL (ref 0.0–0.1)
Basophils Relative: 0.5 % (ref 0.0–3.0)
Eosinophils Absolute: 0 10*3/uL (ref 0.0–0.7)
Eosinophils Relative: 1.1 % (ref 0.0–5.0)
HCT: 33.4 % — ABNORMAL LOW (ref 36.0–46.0)
Hemoglobin: 11.7 g/dL — ABNORMAL LOW (ref 12.0–15.0)
Lymphocytes Relative: 37.5 % (ref 12.0–46.0)
Lymphs Abs: 1.4 10*3/uL (ref 0.7–4.0)
MCHC: 35.1 g/dL (ref 30.0–36.0)
MCV: 88.3 fl (ref 78.0–100.0)
Monocytes Absolute: 0.4 10*3/uL (ref 0.1–1.0)
Monocytes Relative: 9.9 % (ref 3.0–12.0)
Neutro Abs: 1.9 10*3/uL (ref 1.4–7.7)
Neutrophils Relative %: 51 % (ref 43.0–77.0)
Platelets: 163 10*3/uL (ref 150.0–400.0)
RBC: 3.78 Mil/uL — ABNORMAL LOW (ref 3.87–5.11)
RDW: 13.3 % (ref 11.5–15.5)
WBC: 3.8 10*3/uL — ABNORMAL LOW (ref 4.0–10.5)

## 2020-10-24 LAB — TACROLIMUS,HIGHLY SENSITIVE,LC/MS/MS: Tacrolimus Lvl: 4.5 mcg/L — ABNORMAL LOW

## 2020-10-24 LAB — ACTH: C206 ACTH: 30 pg/mL (ref 6–50)

## 2020-11-24 ENCOUNTER — Other Ambulatory Visit (HOSPITAL_BASED_OUTPATIENT_CLINIC_OR_DEPARTMENT_OTHER): Payer: Self-pay | Admitting: Family Medicine

## 2020-11-24 DIAGNOSIS — D860 Sarcoidosis of lung: Secondary | ICD-10-CM | POA: Diagnosis not present

## 2020-11-24 DIAGNOSIS — J849 Interstitial pulmonary disease, unspecified: Secondary | ICD-10-CM | POA: Diagnosis not present

## 2020-11-24 DIAGNOSIS — Z1231 Encounter for screening mammogram for malignant neoplasm of breast: Secondary | ICD-10-CM

## 2020-11-30 ENCOUNTER — Ambulatory Visit (HOSPITAL_BASED_OUTPATIENT_CLINIC_OR_DEPARTMENT_OTHER)
Admission: RE | Admit: 2020-11-30 | Discharge: 2020-11-30 | Disposition: A | Discharge status: Home / Self Care | Payer: Medicare Other | Source: Ambulatory Visit | Attending: Family Medicine | Admitting: Family Medicine

## 2020-11-30 ENCOUNTER — Other Ambulatory Visit: Payer: Self-pay

## 2020-11-30 DIAGNOSIS — I700 Atherosclerosis of aorta: Secondary | ICD-10-CM | POA: Insufficient documentation

## 2020-11-30 DIAGNOSIS — I864 Gastric varices: Secondary | ICD-10-CM | POA: Insufficient documentation

## 2020-11-30 DIAGNOSIS — R918 Other nonspecific abnormal finding of lung field: Secondary | ICD-10-CM | POA: Diagnosis not present

## 2020-11-30 DIAGNOSIS — D860 Sarcoidosis of lung: Secondary | ICD-10-CM | POA: Insufficient documentation

## 2020-11-30 DIAGNOSIS — J984 Other disorders of lung: Secondary | ICD-10-CM | POA: Diagnosis not present

## 2020-11-30 DIAGNOSIS — K766 Portal hypertension: Secondary | ICD-10-CM | POA: Diagnosis not present

## 2020-11-30 DIAGNOSIS — K7460 Unspecified cirrhosis of liver: Secondary | ICD-10-CM | POA: Insufficient documentation

## 2020-11-30 DIAGNOSIS — I868 Varicose veins of other specified sites: Secondary | ICD-10-CM | POA: Diagnosis not present

## 2020-11-30 DIAGNOSIS — Z1231 Encounter for screening mammogram for malignant neoplasm of breast: Secondary | ICD-10-CM | POA: Insufficient documentation

## 2020-11-30 DIAGNOSIS — D869 Sarcoidosis, unspecified: Secondary | ICD-10-CM | POA: Diagnosis not present

## 2020-12-03 ENCOUNTER — Encounter: Payer: Self-pay | Admitting: Family Medicine

## 2021-01-03 DIAGNOSIS — E2740 Unspecified adrenocortical insufficiency: Secondary | ICD-10-CM | POA: Diagnosis not present

## 2021-01-08 DIAGNOSIS — I10 Essential (primary) hypertension: Secondary | ICD-10-CM | POA: Diagnosis not present

## 2021-01-08 DIAGNOSIS — N141 Nephropathy induced by other drugs, medicaments and biological substances: Secondary | ICD-10-CM | POA: Diagnosis not present

## 2021-01-08 DIAGNOSIS — N1832 Chronic kidney disease, stage 3b: Secondary | ICD-10-CM | POA: Diagnosis not present

## 2021-01-08 DIAGNOSIS — Z944 Liver transplant status: Secondary | ICD-10-CM | POA: Diagnosis not present

## 2021-01-08 DIAGNOSIS — T451X5A Adverse effect of antineoplastic and immunosuppressive drugs, initial encounter: Secondary | ICD-10-CM | POA: Diagnosis not present

## 2021-01-08 DIAGNOSIS — D849 Immunodeficiency, unspecified: Secondary | ICD-10-CM | POA: Diagnosis not present

## 2021-02-15 ENCOUNTER — Other Ambulatory Visit: Payer: Self-pay

## 2021-02-15 DIAGNOSIS — Z944 Liver transplant status: Secondary | ICD-10-CM

## 2021-02-19 ENCOUNTER — Other Ambulatory Visit (INDEPENDENT_AMBULATORY_CARE_PROVIDER_SITE_OTHER): Payer: Medicare Other

## 2021-02-19 ENCOUNTER — Other Ambulatory Visit: Payer: Medicare Other

## 2021-02-19 ENCOUNTER — Other Ambulatory Visit: Payer: Self-pay

## 2021-02-19 DIAGNOSIS — R918 Other nonspecific abnormal finding of lung field: Secondary | ICD-10-CM | POA: Diagnosis not present

## 2021-02-19 DIAGNOSIS — Z944 Liver transplant status: Secondary | ICD-10-CM

## 2021-02-19 DIAGNOSIS — I8500 Esophageal varices without bleeding: Secondary | ICD-10-CM | POA: Diagnosis not present

## 2021-02-19 DIAGNOSIS — D869 Sarcoidosis, unspecified: Secondary | ICD-10-CM | POA: Diagnosis not present

## 2021-02-19 DIAGNOSIS — I313 Pericardial effusion (noninflammatory): Secondary | ICD-10-CM | POA: Diagnosis not present

## 2021-02-19 NOTE — Addendum Note (Signed)
Addended by: Kelle Darting A on: 02/19/2021 08:04 AM   Modules accepted: Orders

## 2021-02-19 NOTE — Progress Notes (Signed)
The following labs were ordered with no expected due dates: CBC, Comp, Cortisol, ACTH and Tacrolimus.  Pt states the only lab due to today is ACTH.  All other labs will be due in July.  ACTH sent out today and all other labs re-ordered for July.

## 2021-02-22 LAB — ACTH: C206 ACTH: 30 pg/mL (ref 6–50)

## 2021-03-14 DIAGNOSIS — E2740 Unspecified adrenocortical insufficiency: Secondary | ICD-10-CM | POA: Diagnosis not present

## 2021-04-20 DIAGNOSIS — L821 Other seborrheic keratosis: Secondary | ICD-10-CM | POA: Diagnosis not present

## 2021-04-20 DIAGNOSIS — Z86018 Personal history of other benign neoplasm: Secondary | ICD-10-CM | POA: Diagnosis not present

## 2021-04-20 DIAGNOSIS — D2262 Melanocytic nevi of left upper limb, including shoulder: Secondary | ICD-10-CM | POA: Diagnosis not present

## 2021-04-20 DIAGNOSIS — D225 Melanocytic nevi of trunk: Secondary | ICD-10-CM | POA: Diagnosis not present

## 2021-04-25 ENCOUNTER — Encounter: Payer: Self-pay | Admitting: Family Medicine

## 2021-04-29 NOTE — Patient Instructions (Addendum)
Always so good to see you! I will be in touch with your labs and your pap report  Consider getting the prenvar 20 pneumonia vaccine and shingles vaccines (assuming ok with your transplant team) at your convenience

## 2021-04-29 NOTE — Progress Notes (Signed)
[place] at [facility] [place], [place], [place] [address] [phone]  Date:  04/30/2021   Name:  Alicia [name]   DOB:  1961-08-31   MRN:  [id number]  PCP:  Darreld Mclean, MD    Chief Complaint: Annual Exam   History of Present Illness:  Alicia [name] is a 60 y.o. 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 Dr Quillian Quince- however Dr Rodena Piety left [facility] so she is getting a new provider Alicia [name] is a 60 y.o. 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 She was last seen by me in 02/2018 and Alicia [name] in 05/2019 Interval History:  Alicia [name] is doing well since her last clinic visit. She has not had any exacerbations of her Sarcoid and overall feels her breathing is stable. She continues to have a very bothersome chronic cough that has not been very responsive to various treatments, even cough suppressants, more aggressive treatment of allergies, etc. She admits she does have occasional sinus congestion and post-nasal drainage. She is not seeing an ENT regularly but did previously. She does occasionally have some voice hoarseness as well.    1. Pulmonary Sarcoidosis  Overall, she appears to be sable from a pulmonary perspective. She continues to do well on her current regimen of Spiriva and Dulera. She has some exertional dyspnea at baseline, which is overall unchanged. Additionally, she  has continued to have chronic persistent cough that has been somewhat refractory to treatment. Her cough continues to be bothersome. We had tried a trial of Tussionex, which seemed to help somewhat. However, her transplant team is hesitant for her to can continue this long-term, and I would agree. I discussed with her at length that her cough could be a manifestation of her underlying sarcoid. We discussed potentially doing a trial of increasing her prednisone, but she is hesitant to do so given the side effects. I think this is understandable as well. She does endorse some occasional sinus congestion and voice hoarseness as well. She has not followed by ENT, so I recommended that she see an ENT specialist. She said that she would follow-up with someone locally.  At her last clinic visit, we switched her from an inhaled corticosteroid to a combination LAMA LABA inhaler. She has noted some improvement in her hoarseness but no significant change in her cough. I think it is reasonable to continue this for now.  Her most recent PFTs do show a decline in her FEV1 and worsening air-trapping since prior. Therefore, she had a repeat CT scan of her chest (this was done at an outside facility). I am going to have our radiologist do a formal read, but on my review, there are multiple upper lobe predominant pulmonary nodules in a perilymphatic distribution that appear to be overall stable in size compared to prior cross-sectional imaging done a few years ago. She has no adenopathy.   - Will continue LAMA LABA with as needed albuterol -  Encouraged her to continue to engage in regular exercise program; discussed with her to consider cardiac rehab. If not a candidate, we can potentially enroll her in our pulmonary rehab group.  - Will follow-up radiology read of recent CT scan of her chest. - Recommend that she consider following up with an ENT. 2. Allergic Rhinitis  Patient report prior allergy testing > 20 years ago  positive for several food and environmental triggers. Endorses worsening PND, sneezing, itchy/watery eyes.  -Continue Zyrtec '5mg'$  daily -Continue flonase 2 sprays daily    Endocrinology care is also at [facility]- Dr Paticia Stack- for her adrenal insuf and osteoporosis. She is on fosamax for osteoporosis due chronic steroids and is bone density had improved at last DEXA scan  She is taking hydrocortisone 15 am and 10 pm- they do hope she may be able to stop this at some point.  Her natural steroid production is getting better with each did test  Pap- can be updated Can give pnuemovax, shingrix- we think she is UTD but she declines any other immun for now  Mammo UTD Dexa UTD  Alicia [name] did not take her blood pressure medicines yet this morning, this is likely why her pressure is a bit up at the moment  She notes that she may get a bit tired from the afternoon and sometimes like to take a nap.  Overall she feels that she is doing quite well  BP Readings from Last 3 Encounters:  04/30/21 (!) 148/88  10/18/19 128/82  04/08/19 124/80     Lab Results  Component Value Date   HGBA1C 5.3 10/18/2019    Patient Active Problem List   Diagnosis Date Noted   Osteoporosis without current pathological fracture 05/25/2018   Status post liver transplant ([place]) 12/25/2017   Syncope 07/17/2017   Other cirrhosis of liver ([place]) 05/10/2017   Immunocompromised patient ([place]) 05/10/2017   Mucous cyst of digit of left hand 04/11/2017   Cellulitis, abdominal wall 03/01/2017   Normocytic anemia 03/01/2017   Hypokalemia 03/01/2017   CKD (chronic kidney disease), stage III ([place]) 03/01/2017   Abdominal wall cellulitis 03/01/2017   Adrenal insufficiency ([place]) 03/01/2017   Hyperammonemia ([place])    Cellulitis 06/28/2016   Thrombocytopenia ([place]) 06/28/2016   AKI (acute kidney injury) ([place]) 06/28/2016   Multiple pulmonary nodules determined by computed tomography of lung    Chronic cough 09/07/2015   Hypertension, portal  ([place]) 09/04/2011   Asthma with chronic obstructive pulmonary disease (COPD) ([place]) 09/04/2011   Vocal cord nodules 07/04/2011   GERD (gastroesophageal reflux disease) 07/04/2011   Sarcoidosis 07/01/2011   Asthma, severe persistent, with upper airway instability 06/13/2011   Congenital hepatic fibrosis 07/03/2000    Past Medical History:  Diagnosis Date   Allergy    Anemia    Asthma    Cellulitis    Cirrhosis ([place])    GERD (gastroesophageal reflux disease)    Hepatic fibrosis    congenital   History of UTI    chronic as a child   Sarcoidosis    Spleen enlarged     Past Surgical History:  Procedure Laterality Date   COLONOSCOPY     CYST EXCISION Left 05/01/2017   Procedure: LEFT INDEX FINGER CYST EXCISION;  Surgeon: Leandrew Koyanagi, MD;  Location: [facility];  Service: Orthopedics;  Laterality: Left;   ESOPHAGOGASTRODUODENOSCOPY     LIVER TRANSPLANT     SPINAL FUSION  12/2007   Double spinal Fusion   VIDEO BRONCHOSCOPY Bilateral 12/08/2015  Procedure: VIDEO BRONCHOSCOPY WITH FLUORO;  Surgeon: Rigoberto Noel, MD;  Location: [facility];  Service: Cardiopulmonary;  Laterality: Bilateral;    Social History   Tobacco Use   Smoking status: Never   Smokeless tobacco: Never  Substance Use Topics   Alcohol use: No   Drug use: No    Family History  Problem Relation Age of Onset   Arthritis Mother    Heart disease Father        Triple bypass   Migraines Father    Heart attack Paternal Grandmother    Stroke Paternal Grandfather    Liver disease Sister        Juvenile cysitc liver disease   Asthma Sister     Allergies  Allergen Reactions   Aspirin Other (See Comments)    Causes Asthma   Peanut-Containing Drug Products Shortness Of Breath    Asthma, eye swelling, itchy eyes, can't catch her breath   Gabapentin Other (See Comments)    Blisters in mouth, nausea    Statins     Asthma, eye swelling, itchy eyes, can't catch her breath   Azithromycin Nausea And Vomiting     Medication list has been reviewed and updated.  Current Outpatient Medications on File Prior to Visit  Medication Sig Dispense Refill   albuterol (PROVENTIL HFA;VENTOLIN HFA) 108 (90 Base) MCG/ACT inhaler Inhale 1-2 puffs into the lungs every 6 (six) hours as needed for wheezing or shortness of breath. 1 Inhaler 0   bumetanide (BUMEX) 1 MG tablet Take 1 mg by mouth daily.     calcium citrate-vitamin D (CITRACAL+D) 315-200 MG-UNIT tablet Take 1 tablet by mouth 2 (two) times daily.     dipyridamole (PERSANTINE) 50 MG tablet Take 50 mg by mouth daily.     hydrocortisone (CORTEF) 10 MG tablet Take by mouth. 15 mg in morning, 5 mg at bedtime     ipratropium-albuterol (DUONEB) 0.5-2.5 (3) MG/3ML SOLN Take 3 mLs by nebulization every 4 (four) hours as needed. 360 mL 0   losartan (COZAAR) 25 MG tablet Take 25 mg by mouth daily.     magnesium oxide (MAG-OX) 400 MG tablet Take 400 mg by mouth 2 (two) times daily.     pantoprazole (PROTONIX) 40 MG tablet Take 40 mg by mouth daily.     SPIRIVA RESPIMAT 2.5 MCG/ACT AERS Inhale 2 puffs into the lungs daily at 2 PM.   11   tacrolimus (PROGRAF) 1 MG capsule Take 1 mg by mouth 2 (two) times a day. TAKE 2 TABLETS ('3MG'$  TOTAL) IN THE MORNING AND THREE TABLETS ('3MG'$  TOTAL) IN THE EVENING.     UNABLE TO FIND 25 mg. Med Name: Verapamil 25 mg daily     No current facility-administered medications on file prior to visit.    Review of Systems:  As per HPI- otherwise negative.   Physical Examination: Vitals:   04/30/21 0911  BP: (!) 148/88  Pulse: 77  Resp: 18  Temp: 97.8 F (36.6 C)  SpO2: 91%   Vitals:   04/30/21 0911  Weight: 162 lb (73.5 kg)  Height: '5\' 2"'$  (1.575 m)   Body mass index is 29.63 kg/m. Ideal Body Weight: Weight in (lb) to have BMI = 25: 136.4  GEN: no acute distress.  Overweight, looks well Bilateral TM wnl, oropharynx normal.  PEERL,EOMI.   HEENT: Atraumatic, Normocephalic.  Ears and Nose: No external deformity. CV: RRR,  No M/G/R. No JVD. No thrill. No extra heart sounds. PULM: CTA B, no  wheezes, crackles, rhonchi. No retractions. No resp. distress. No accessory muscle use. ABD: S, NT, ND, +BS. No rebound. No HSM. Large well-healed scars across her abdomen from liver transplant surgery EXTR: No c/c/e Well-healed skin graft site on her right leg PSYCH: Normally interactive. Conversant.  Collected Pap today.  Normal pelvic exam for age  Assessment and Plan: Physical exam  Liver transplant recipient [facility]) - Plan: Comprehensive metabolic panel, CBC with Differential/Platelet, Tacrolimus,Highly Sensitive,LC/MS/MS, CANCELED: Tacrolimus level  Osteoporosis, postmenopausal  Screening for hyperlipidemia - Plan: Lipid panel  Screening for diabetes mellitus - Plan: Comprehensive metabolic panel, Hemoglobin A1c  Congenital hepatic fibrosis  Adrenal insufficiency ([place]) - Plan: Cortisol, ACTH  Sarcoidosis  Fatigue, unspecified type - Plan: TSH, VITAMIN D 25 Hydroxy (Vit-D Deficiency, Fractures)  Screening for cervical cancer - Plan: Cytology - PAP, CANCELED: Cytology - PAP  Alicia [name] is here today for physical exam.  Encouraged healthy diet and exercise routine Labs are pending as above Pap collected We discussed Prevnar 20 and Shingrix.  She declines today, would like to double check with her transplant team.  I am glad to provide any shots at her convenience  Continue routine mammogram and DEXA scan This visit occurred during the SARS-CoV-2 public health emergency.  Safety protocols were in place, including screening questions prior to the visit, additional usage of staff PPE, and extensive cleaning of exam room while observing appropriate contact time as indicated for disinfecting solutions.   Signed Lamar Blinks, MD

## 2021-04-30 ENCOUNTER — Other Ambulatory Visit (HOSPITAL_COMMUNITY)
Admission: RE | Admit: 2021-04-30 | Discharge: 2021-04-30 | Disposition: A | Discharge status: Home / Self Care | Payer: Medicare Other | Source: Ambulatory Visit | Attending: Family Medicine | Admitting: Family Medicine

## 2021-04-30 ENCOUNTER — Other Ambulatory Visit: Payer: Self-pay

## 2021-04-30 ENCOUNTER — Ambulatory Visit (INDEPENDENT_AMBULATORY_CARE_PROVIDER_SITE_OTHER): Payer: Medicare Other | Admitting: Family Medicine

## 2021-04-30 ENCOUNTER — Encounter: Payer: Self-pay | Admitting: Family Medicine

## 2021-04-30 VITALS — BP 148/88 | HR 77 | Temp 97.8°F | Resp 18 | Ht 62.0 in | Wt 162.0 lb

## 2021-04-30 DIAGNOSIS — E2740 Unspecified adrenocortical insufficiency: Secondary | ICD-10-CM

## 2021-04-30 DIAGNOSIS — Z13220 Encounter for screening for lipoid disorders: Secondary | ICD-10-CM

## 2021-04-30 DIAGNOSIS — R5383 Other fatigue: Secondary | ICD-10-CM

## 2021-04-30 DIAGNOSIS — Z131 Encounter for screening for diabetes mellitus: Secondary | ICD-10-CM | POA: Diagnosis not present

## 2021-04-30 DIAGNOSIS — Z1151 Encounter for screening for human papillomavirus (HPV): Secondary | ICD-10-CM | POA: Insufficient documentation

## 2021-04-30 DIAGNOSIS — M810 Age-related osteoporosis without current pathological fracture: Secondary | ICD-10-CM

## 2021-04-30 DIAGNOSIS — Z124 Encounter for screening for malignant neoplasm of cervix: Secondary | ICD-10-CM

## 2021-04-30 DIAGNOSIS — Z0000 Encounter for general adult medical examination without abnormal findings: Secondary | ICD-10-CM

## 2021-04-30 DIAGNOSIS — D869 Sarcoidosis, unspecified: Secondary | ICD-10-CM

## 2021-04-30 DIAGNOSIS — Z01419 Encounter for gynecological examination (general) (routine) without abnormal findings: Secondary | ICD-10-CM | POA: Insufficient documentation

## 2021-04-30 DIAGNOSIS — Z944 Liver transplant status: Secondary | ICD-10-CM

## 2021-04-30 LAB — HEMOGLOBIN A1C: Hgb A1c MFr Bld: 5.4 % (ref 4.6–6.5)

## 2021-04-30 LAB — COMPREHENSIVE METABOLIC PANEL
ALT: 6 U/L (ref 0–35)
AST: 9 U/L (ref 0–37)
Albumin: 4.2 g/dL (ref 3.5–5.2)
Alkaline Phosphatase: 51 U/L (ref 39–117)
BUN: 23 mg/dL (ref 6–23)
CO2: 26 mEq/L (ref 19–32)
Calcium: 9.1 mg/dL (ref 8.4–10.5)
Chloride: 105 mEq/L (ref 96–112)
Creatinine, Ser: 1.88 mg/dL — ABNORMAL HIGH (ref 0.40–1.20)
GFR: 28.32 mL/min — ABNORMAL LOW (ref >60.00)
Glucose, Bld: 95 mg/dL (ref 70–99)
Potassium: 4.4 mEq/L (ref 3.5–5.1)
Sodium: 139 mEq/L (ref 135–145)
Total Bilirubin: 0.4 mg/dL (ref 0.2–1.2)
Total Protein: 6.3 g/dL (ref 6.0–8.3)

## 2021-04-30 LAB — CBC WITH DIFFERENTIAL/PLATELET
Basophils Absolute: 0 10*3/uL (ref 0.0–0.1)
Basophils Relative: 0.3 % (ref 0.0–3.0)
Eosinophils Absolute: 0 10*3/uL (ref 0.0–0.7)
Eosinophils Relative: 1.2 % (ref 0.0–5.0)
HCT: 34.5 % — ABNORMAL LOW (ref 36.0–46.0)
Hemoglobin: 12.1 g/dL (ref 12.0–15.0)
Lymphocytes Relative: 40.4 % (ref 12.0–46.0)
Lymphs Abs: 1.5 10*3/uL (ref 0.7–4.0)
MCHC: 35.1 g/dL (ref 30.0–36.0)
MCV: 89.2 fl (ref 78.0–100.0)
Monocytes Absolute: 0.4 10*3/uL (ref 0.1–1.0)
Monocytes Relative: 10 % (ref 3.0–12.0)
Neutro Abs: 1.8 10*3/uL (ref 1.4–7.7)
Neutrophils Relative %: 48.1 % (ref 43.0–77.0)
Platelets: 173 10*3/uL (ref 150.0–400.0)
RBC: 3.86 Mil/uL — ABNORMAL LOW (ref 3.87–5.11)
RDW: 13.5 % (ref 11.5–15.5)
WBC: 3.6 10*3/uL — ABNORMAL LOW (ref 4.0–10.5)

## 2021-04-30 LAB — LIPID PANEL
Cholesterol: 151 mg/dL (ref 0–200)
HDL: 42.6 mg/dL (ref >39.00)
LDL Cholesterol: 92 mg/dL (ref 0–99)
NonHDL: 108.19
Total CHOL/HDL Ratio: 4
Triglycerides: 81 mg/dL (ref 0.0–149.0)
VLDL: 16.2 mg/dL (ref 0.0–40.0)

## 2021-04-30 LAB — CORTISOL: Cortisol, Plasma: 5.6 ug/dL

## 2021-04-30 LAB — TSH: TSH: 2.21 u[IU]/mL (ref 0.35–5.50)

## 2021-04-30 LAB — VITAMIN D 25 HYDROXY (VIT D DEFICIENCY, FRACTURES): VITD: 42.82 ng/mL (ref 30.00–100.00)

## 2021-05-01 LAB — CYTOLOGY - PAP
Comment: NEGATIVE
Diagnosis: NEGATIVE
High risk HPV: NEGATIVE

## 2021-05-02 ENCOUNTER — Encounter: Payer: Self-pay | Admitting: Family Medicine

## 2021-05-02 LAB — TACROLIMUS,HIGHLY SENSITIVE,LC/MS/MS: Tacrolimus Lvl: 6.6 mcg/L

## 2021-05-04 LAB — TACROLIMUS LEVEL

## 2021-05-04 LAB — ACTH: C206 ACTH: 26 pg/mL (ref 6–50)

## 2021-07-02 ENCOUNTER — Encounter: Payer: Self-pay | Admitting: Family Medicine

## 2021-07-02 DIAGNOSIS — Z944 Liver transplant status: Secondary | ICD-10-CM

## 2021-07-02 DIAGNOSIS — D869 Sarcoidosis, unspecified: Secondary | ICD-10-CM

## 2021-07-02 DIAGNOSIS — J382 Nodules of vocal cords: Secondary | ICD-10-CM

## 2021-07-02 DIAGNOSIS — D849 Immunodeficiency, unspecified: Secondary | ICD-10-CM | POA: Diagnosis not present

## 2021-07-03 NOTE — Addendum Note (Signed)
Addended by: Lamar Blinks C on: 07/03/2021 05:46 PM   Modules accepted: Orders

## 2021-07-05 ENCOUNTER — Other Ambulatory Visit: Payer: Self-pay

## 2021-07-05 ENCOUNTER — Other Ambulatory Visit (INDEPENDENT_AMBULATORY_CARE_PROVIDER_SITE_OTHER): Payer: Medicare Other

## 2021-07-05 DIAGNOSIS — Z944 Liver transplant status: Secondary | ICD-10-CM

## 2021-07-05 LAB — CBC WITH DIFFERENTIAL/PLATELET
Basophils Absolute: 0 10*3/uL (ref 0.0–0.1)
Basophils Relative: 0.3 % (ref 0.0–3.0)
Eosinophils Absolute: 0 10*3/uL (ref 0.0–0.7)
Eosinophils Relative: 1 % (ref 0.0–5.0)
HCT: 36.3 % (ref 36.0–46.0)
Hemoglobin: 12.3 g/dL (ref 12.0–15.0)
Lymphocytes Relative: 41.8 % (ref 12.0–46.0)
Lymphs Abs: 1.6 10*3/uL (ref 0.7–4.0)
MCHC: 34 g/dL (ref 30.0–36.0)
MCV: 89.5 fl (ref 78.0–100.0)
Monocytes Absolute: 0.4 10*3/uL (ref 0.1–1.0)
Monocytes Relative: 9.1 % (ref 3.0–12.0)
Neutro Abs: 1.8 10*3/uL (ref 1.4–7.7)
Neutrophils Relative %: 47.8 % (ref 43.0–77.0)
Platelets: 170 10*3/uL (ref 150.0–400.0)
RBC: 4.05 Mil/uL (ref 3.87–5.11)
RDW: 12.8 % (ref 11.5–15.5)
WBC: 3.9 10*3/uL — ABNORMAL LOW (ref 4.0–10.5)

## 2021-07-05 LAB — COMPREHENSIVE METABOLIC PANEL
ALT: 5 U/L (ref 0–35)
AST: 9 U/L (ref 0–37)
Albumin: 4.1 g/dL (ref 3.5–5.2)
Alkaline Phosphatase: 50 U/L (ref 39–117)
BUN: 23 mg/dL (ref 6–23)
CO2: 29 mEq/L (ref 19–32)
Calcium: 9.2 mg/dL (ref 8.4–10.5)
Chloride: 107 mEq/L (ref 96–112)
Creatinine, Ser: 1.94 mg/dL — ABNORMAL HIGH (ref 0.40–1.20)
GFR: 27.24 mL/min — ABNORMAL LOW (ref >60.00)
Glucose, Bld: 94 mg/dL (ref 70–99)
Potassium: 4.5 mEq/L (ref 3.5–5.1)
Sodium: 141 mEq/L (ref 135–145)
Total Bilirubin: 0.5 mg/dL (ref 0.2–1.2)
Total Protein: 5.9 g/dL — ABNORMAL LOW (ref 6.0–8.3)

## 2021-07-05 LAB — CORTISOL: Cortisol, Plasma: 6.1 ug/dL

## 2021-07-06 ENCOUNTER — Encounter: Payer: Self-pay | Admitting: Family Medicine

## 2021-07-08 LAB — ACTH: C206 ACTH: 27 pg/mL (ref 6–50)

## 2021-07-09 LAB — TACROLIMUS,HIGHLY SENSITIVE,LC/MS/MS: Tacrolimus Lvl: 7.3 mcg/L

## 2021-07-18 DIAGNOSIS — E2749 Other adrenocortical insufficiency: Secondary | ICD-10-CM | POA: Diagnosis not present

## 2021-07-18 DIAGNOSIS — M810 Age-related osteoporosis without current pathological fracture: Secondary | ICD-10-CM | POA: Diagnosis not present

## 2021-07-18 DIAGNOSIS — Z981 Arthrodesis status: Secondary | ICD-10-CM | POA: Diagnosis not present

## 2021-07-18 DIAGNOSIS — Z780 Asymptomatic menopausal state: Secondary | ICD-10-CM | POA: Diagnosis not present

## 2021-07-18 DIAGNOSIS — R7989 Other specified abnormal findings of blood chemistry: Secondary | ICD-10-CM | POA: Diagnosis not present

## 2021-07-18 DIAGNOSIS — Z944 Liver transplant status: Secondary | ICD-10-CM | POA: Diagnosis not present

## 2021-07-24 ENCOUNTER — Encounter: Payer: Self-pay | Admitting: Family Medicine

## 2021-07-24 DIAGNOSIS — Z944 Liver transplant status: Secondary | ICD-10-CM

## 2021-08-03 ENCOUNTER — Other Ambulatory Visit: Payer: Self-pay

## 2021-08-03 ENCOUNTER — Other Ambulatory Visit (INDEPENDENT_AMBULATORY_CARE_PROVIDER_SITE_OTHER): Payer: Medicare Other

## 2021-08-03 DIAGNOSIS — Z944 Liver transplant status: Secondary | ICD-10-CM | POA: Diagnosis not present

## 2021-08-03 LAB — COMPREHENSIVE METABOLIC PANEL
ALT: 5 U/L (ref 0–35)
AST: 10 U/L (ref 0–37)
Albumin: 4.3 g/dL (ref 3.5–5.2)
Alkaline Phosphatase: 49 U/L (ref 39–117)
BUN: 26 mg/dL — ABNORMAL HIGH (ref 6–23)
CO2: 27 mEq/L (ref 19–32)
Calcium: 8.7 mg/dL (ref 8.4–10.5)
Chloride: 108 mEq/L (ref 96–112)
Creatinine, Ser: 1.95 mg/dL — ABNORMAL HIGH (ref 0.40–1.20)
GFR: 27.05 mL/min — ABNORMAL LOW (ref >60.00)
Glucose, Bld: 91 mg/dL (ref 70–99)
Potassium: 4.9 mEq/L (ref 3.5–5.1)
Sodium: 141 mEq/L (ref 135–145)
Total Bilirubin: 0.5 mg/dL (ref 0.2–1.2)
Total Protein: 6.2 g/dL (ref 6.0–8.3)

## 2021-08-03 LAB — CBC
HCT: 36.6 % (ref 36.0–46.0)
Hemoglobin: 12.5 g/dL (ref 12.0–15.0)
MCHC: 34 g/dL (ref 30.0–36.0)
MCV: 89.2 fl (ref 78.0–100.0)
Platelets: 177 10*3/uL (ref 150.0–400.0)
RBC: 4.11 Mil/uL (ref 3.87–5.11)
RDW: 13.3 % (ref 11.5–15.5)
WBC: 3.5 10*3/uL — ABNORMAL LOW (ref 4.0–10.5)

## 2021-08-03 LAB — CORTISOL: Cortisol, Plasma: 6.2 ug/dL

## 2021-08-06 LAB — TACROLIMUS,HIGHLY SENSITIVE,LC/MS/MS: Tacrolimus Lvl: 5 mcg/L

## 2021-08-06 LAB — ACTH: C206 ACTH: 27 pg/mL (ref 6–50)

## 2021-08-15 ENCOUNTER — Encounter: Payer: Self-pay | Admitting: Family Medicine

## 2021-08-16 ENCOUNTER — Ambulatory Visit (INDEPENDENT_AMBULATORY_CARE_PROVIDER_SITE_OTHER): Payer: Medicare Other

## 2021-08-16 ENCOUNTER — Encounter: Payer: Self-pay | Admitting: Family Medicine

## 2021-08-16 VITALS — Ht 62.0 in | Wt 159.0 lb

## 2021-08-16 DIAGNOSIS — Z0000 Encounter for general adult medical examination without abnormal findings: Secondary | ICD-10-CM

## 2021-08-16 NOTE — Patient Instructions (Signed)
Alicia [name] , Thank you for taking time to complete your Medicare Wellness Visit. I appreciate your ongoing commitment to your health goals. Please review the following plan we discussed and let me know if I can assist you in the future.   Screening recommendations/referrals: Colonoscopy: Completed 07/07/2019-Due 07/06/2029 Mammogram: Completed 11/30/2020-Due 11/30/2021 Bone Density: Due at age 60 Recommended yearly ophthalmology/optometry visit for glaucoma screening and checkup Recommended yearly dental visit for hygiene and checkup  Vaccinations: Influenza vaccine: Due-May obtain vaccine at our office or your local pharmacy. Pneumococcal vaccine: Up to date Tdap vaccine: Up to date Shingles vaccine: Discuss with pharmacy  Covid-19: Booster available at the pharmacy  Advanced directives: Copy in chart  Conditions/risks identified: See problem list  Next appointment: Follow up in one year for your annual wellness visit. 08/20/2022 @ 9:00  Preventive Care 40-64 Years, Female Preventive care refers to lifestyle choices and visits with your health care provider that can promote health and wellness. What does preventive care include? A yearly physical exam. This is also called an annual well check. Dental exams once or twice a year. Routine eye exams. Ask your health care provider how often you should have your eyes checked. Personal lifestyle choices, including: Daily care of your teeth and gums. Regular physical activity. Eating a healthy diet. Avoiding tobacco and drug use. Limiting alcohol use. Practicing safe sex. Taking low-dose aspirin daily starting at age 3. Taking vitamin and mineral supplements as recommended by your health care provider. What happens during an annual well check? The services and screenings done by your health care provider during your annual well check will depend on your age, overall health, lifestyle risk factors, and family history of  disease. Counseling  Your health care provider may ask you questions about your: Alcohol use. Tobacco use. Drug use. Emotional well-being. Home and relationship well-being. Sexual activity. Eating habits. Work and work Statistician. Method of birth control. Menstrual cycle. Pregnancy history. Screening  You may have the following tests or measurements: Height, weight, and BMI. Blood pressure. Lipid and cholesterol levels. These may be checked every 5 years, or more frequently if you are over 42 years old. Skin check. Lung cancer screening. You may have this screening every year starting at age 76 if you have a 30-pack-year history of smoking and currently smoke or have quit within the past 15 years. Fecal occult blood test (FOBT) of the stool. You may have this test every year starting at age 1. Flexible sigmoidoscopy or colonoscopy. You may have a sigmoidoscopy every 5 years or a colonoscopy every 10 years starting at age 43. Hepatitis C blood test. Hepatitis B blood test. Sexually transmitted disease (STD) testing. Diabetes screening. This is done by checking your blood sugar (glucose) after you have not eaten for a while (fasting). You may have this done every 1-3 years. Mammogram. This may be done every 1-2 years. Talk to your health care provider about when you should start having regular mammograms. This may depend on whether you have a family history of breast cancer. BRCA-related cancer screening. This may be done if you have a family history of breast, ovarian, tubal, or peritoneal cancers. Pelvic exam and Pap test. This may be done every 3 years starting at age 62. Starting at age 56, this may be done every 5 years if you have a Pap test in combination with an HPV test. Bone density scan. This is done to screen for osteoporosis. You may have this scan if you are at  high risk for osteoporosis. Discuss your test results, treatment options, and if necessary, the need for more  tests with your health care provider. Vaccines  Your health care provider may recommend certain vaccines, such as: Influenza vaccine. This is recommended every year. Tetanus, diphtheria, and acellular pertussis (Tdap, Td) vaccine. You may need a Td booster every 10 years. Zoster vaccine. You may need this after age 9. Pneumococcal 13-valent conjugate (PCV13) vaccine. You may need this if you have certain conditions and were not previously vaccinated. Pneumococcal polysaccharide (PPSV23) vaccine. You may need one or two doses if you smoke cigarettes or if you have certain conditions. Talk to your health care provider about which screenings and vaccines you need and how often you need them. This information is not intended to replace advice given to you by your health care provider. Make sure you discuss any questions you have with your health care provider. Document Released: 10/13/2015 Document Revised: 06/05/2016 Document Reviewed: 07/18/2015 Elsevier Interactive Patient Education  2017 [place] Prevention in the Home Falls can cause injuries. They can happen to people of all ages. There are many things you can do to make your home safe and to help prevent falls. What can I do on the outside of my home? Regularly fix the edges of walkways and driveways and fix any cracks. Remove anything that might make you trip as you walk through a door, such as a raised step or threshold. Trim any bushes or trees on the path to your home. Use bright outdoor lighting. Clear any walking paths of anything that might make someone trip, such as rocks or tools. Regularly check to see if handrails are loose or broken. Make sure that both sides of any steps have handrails. Any raised decks and porches should have guardrails on the edges. Have any leaves, snow, or ice cleared regularly. Use sand or salt on walking paths during winter. Clean up any spills in your garage right away. This includes  oil or grease spills. What can I do in the bathroom? Use night lights. Install grab bars by the toilet and in the tub and shower. Do not use towel bars as grab bars. Use non-skid mats or decals in the tub or shower. If you need to sit down in the shower, use a plastic, non-slip stool. Keep the floor dry. Clean up any water that spills on the floor as soon as it happens. Remove soap buildup in the tub or shower regularly. Attach bath mats securely with double-sided non-slip rug tape. Do not have throw rugs and other things on the floor that can make you trip. What can I do in the bedroom? Use night lights. Make sure that you have a light by your bed that is easy to reach. Do not use any sheets or blankets that are too big for your bed. They should not hang down onto the floor. Have a firm chair that has side arms. You can use this for support while you get dressed. Do not have throw rugs and other things on the floor that can make you trip. What can I do in the kitchen? Clean up any spills right away. Avoid walking on wet floors. Keep items that you use a lot in easy-to-reach places. If you need to reach something above you, use a strong step stool that has a grab bar. Keep electrical cords out of the way. Do not use floor polish or wax that makes floors slippery. If  you must use wax, use non-skid floor wax. Do not have throw rugs and other things on the floor that can make you trip. What can I do with my stairs? Do not leave any items on the stairs. Make sure that there are handrails on both sides of the stairs and use them. Fix handrails that are broken or loose. Make sure that handrails are as long as the stairways. Check any carpeting to make sure that it is firmly attached to the stairs. Fix any carpet that is loose or worn. Avoid having throw rugs at the top or bottom of the stairs. If you do have throw rugs, attach them to the floor with carpet tape. Make sure that you have a light  switch at the top of the stairs and the bottom of the stairs. If you do not have them, ask someone to add them for you. What else can I do to help prevent falls? Wear shoes that: Do not have high heels. Have rubber bottoms. Are comfortable and fit you well. Are closed at the toe. Do not wear sandals. If you use a stepladder: Make sure that it is fully opened. Do not climb a closed stepladder. Make sure that both sides of the stepladder are locked into place. Ask someone to hold it for you, if possible. Clearly mark and make sure that you can see: Any grab bars or handrails. First and last steps. Where the edge of each step is. Use tools that help you move around (mobility aids) if they are needed. These include: Canes. Walkers. Scooters. Crutches. Turn on the lights when you go into a dark area. Replace any light bulbs as soon as they burn out. Set up your furniture so you have a clear path. Avoid moving your furniture around. If any of your floors are uneven, fix them. If there are any pets around you, be aware of where they are. Review your medicines with your doctor. Some medicines can make you feel dizzy. This can increase your chance of falling. Ask your doctor what other things that you can do to help prevent falls. This information is not intended to replace advice given to you by your health care provider. Make sure you discuss any questions you have with your health care provider. Document Released: 07/13/2009 Document Revised: 02/22/2016 Document Reviewed: 10/21/2014 Elsevier Interactive Patient Education  2017 [employer]08/20/2022

## 2021-08-16 NOTE — Progress Notes (Signed)
Subjective:   Alicia [name] is a 60 y.o. female who presents for an Initial Medicare Annual Wellness Visit.   I connected with Diamantina today by telephone and verified that I am speaking with the correct person using two identifiers. Location patient: home Location provider: work Persons participating in the virtual visit: patient, Marine scientist.    I discussed the limitations, risks, security and privacy concerns of performing an evaluation and management service by telephone and the availability of in person appointments. I also discussed with the patient that there may be a patient responsible charge related to this service. The patient expressed understanding and verbally consented to this telephonic visit.    Interactive audio and video telecommunications were attempted between this provider and patient, however failed, due to patient having technical difficulties OR patient did not have access to video capability.  We continued and completed visit with audio only.  Some vital signs may be absent or patient reported.   Time Spent with patient on telephone encounter: 25 minutes  Review of Systems     Cardiac Risk Factors include: hypertension     Objective:    Today's Vitals   08/16/21 0902  Weight: 159 lb (72.1 kg)  Height: 5\' 2"  (1.575 m)   Body mass index is 29.08 kg/m.  Advanced Directives 08/16/2021 08/15/2018 12/09/2017 08/16/2017 07/17/2017 05/10/2017 05/04/2017  Does Patient Have a Medical Advance Directive? Yes No No No No Yes Yes  Type of Paramedic of [place];Living will - - - - Press photographer;Living will [place];Living will  Does patient want to make changes to medical advance directive? - - - - - No - Patient declined -  Copy of [place] in Chart? Yes - validated most recent copy scanned in chart (See row information) - - - - No - copy requested No - copy requested  Would patient like  information on creating a medical advance directive? - No - Patient declined No - Patient declined No - Patient declined - - -    Current Medications (verified) Outpatient Encounter Medications as of 08/16/2021  Medication Sig   albuterol (PROVENTIL HFA;VENTOLIN HFA) 108 (90 Base) MCG/ACT inhaler Inhale 1-2 puffs into the lungs every 6 (six) hours as needed for wheezing or shortness of breath.   bumetanide (BUMEX) 1 MG tablet Take 1 mg by mouth daily.   calcium citrate-vitamin D (CITRACAL+D) 315-200 MG-UNIT tablet Take 1 tablet by mouth 2 (two) times daily.   dipyridamole (PERSANTINE) 50 MG tablet Take 50 mg by mouth daily.   hydrocortisone (CORTEF) 10 MG tablet Take by mouth. 15 mg in morning, 5 mg at bedtime   ipratropium-albuterol (DUONEB) 0.5-2.5 (3) MG/3ML SOLN Take 3 mLs by nebulization every 4 (four) hours as needed.   losartan (COZAAR) 25 MG tablet Take 25 mg by mouth daily.   magnesium oxide (MAG-OX) 400 MG tablet Take 400 mg by mouth 2 (two) times daily.   pantoprazole (PROTONIX) 40 MG tablet Take 40 mg by mouth daily.   SPIRIVA RESPIMAT 2.5 MCG/ACT AERS Inhale 2 puffs into the lungs daily at 2 PM.    tacrolimus (PROGRAF) 1 MG capsule Take 1 mg by mouth 2 (two) times a day. TAKE 2 TABLETS (3MG  TOTAL) IN THE MORNING AND THREE TABLETS (3MG  TOTAL) IN THE EVENING.   UNABLE TO FIND 25 mg. Med Name: Verapamil 25 mg daily   No facility-administered encounter medications on file as of 08/16/2021.    Allergies (  verified) Aspirin, Peanut-containing drug products, Gabapentin, Statins, and Azithromycin   History: Past Medical History:  Diagnosis Date   Allergy    Anemia    Asthma    Cellulitis    Cirrhosis ([place])    GERD (gastroesophageal reflux disease)    Hepatic fibrosis    congenital   History of UTI    chronic as a child   Sarcoidosis    Spleen enlarged    Past Surgical History:  Procedure Laterality Date   COLONOSCOPY     CYST EXCISION Left 05/01/2017   Procedure: LEFT  INDEX FINGER CYST EXCISION;  Surgeon: Leandrew Koyanagi, MD;  Location: [facility];  Service: Orthopedics;  Laterality: Left;   ESOPHAGOGASTRODUODENOSCOPY     LIVER TRANSPLANT     SPINAL FUSION  12/2007   Double spinal Fusion   VIDEO BRONCHOSCOPY Bilateral 12/08/2015   Procedure: VIDEO BRONCHOSCOPY WITH FLUORO;  Surgeon: Rigoberto Noel, MD;  Location: [facility];  Service: Cardiopulmonary;  Laterality: Bilateral;   Family History  Problem Relation Age of Onset   Arthritis Mother    Heart disease Father        Triple bypass   Migraines Father    Heart attack Paternal Grandmother    Stroke Paternal Grandfather    Liver disease Sister        Juvenile cysitc liver disease   Asthma Sister    Social History   Socioeconomic History   Marital status: Married    Spouse name: Not on file   Number of children: Not on file   Years of education: Not on file   Highest education level: Not on file  Occupational History   Occupation: HR Manager    Employer: [employer]  Tobacco Use   Smoking status: Never   Smokeless tobacco: Never  Substance and Sexual Activity   Alcohol use: No   Drug use: No   Sexual activity: Not on file  Other Topics Concern   Not on file  Social History Narrative   Not on file   Social Determinants of Health   Financial Resource Strain: Low Risk    Difficulty of Paying Living Expenses: Not hard at all  Food Insecurity: No Food Insecurity   Worried About Charity fundraiser in the Last Year: Never true   [place] in the Last Year: Never true  Transportation Needs: No Transportation Needs   Lack of Transportation (Medical): No   Lack of Transportation (Non-Medical): No  Physical Activity: Insufficiently Active   Days of Exercise per Week: 3 days   Minutes of Exercise per Session: 30 min  Stress: No Stress Concern Present   Feeling of Stress : Not at all  Social Connections: Socially Integrated   Frequency of Communication with Friends and Family: More  than three times a week   Frequency of Social Gatherings with Friends and Family: More than three times a week   Attends Religious Services: 1 to 4 times per year   Active Member of [employer] or Organizations: Yes   Attends Music therapist: More than 4 times per year   Marital Status: Married    Tobacco Counseling Counseling given: Not Answered   Clinical Intake:  Pre-visit preparation completed: Yes  Pain : No/denies pain     BMI - recorded: 29.08 Nutritional Status: BMI 25 -29 Overweight Nutritional Risks: None Diabetes: No  How often do you need to have someone help you when you read instructions, pamphlets, or  other written materials from your doctor or pharmacy?: 1 - Never  Diabetic?No  Interpreter Needed?: No  Information entered by :: Caroleen Hamman LPN   Activities of Daily Living In your present state of health, do you have any difficulty performing the following activities: 08/16/2021  Hearing? N  Vision? N  Difficulty concentrating or making decisions? N  Walking or climbing stairs? N  Dressing or bathing? N  Doing errands, shopping? N  Preparing Food and eating ? N  Using the Toilet? N  In the past six months, have you accidently leaked urine? N  Do you have problems with loss of bowel control? N  Managing your Medications? N  Managing your Finances? N  Housekeeping or managing your Housekeeping? N  Some recent data might be hidden    Patient Care Team: Copland, Gay Filler, MD as PCP - General (Family Medicine) Olegario Messier, MD as Referring Physician (Internal Medicine)  Indicate any recent Medical Services you may have received from other than [facility] providers in the past year (date may be approximate).     Assessment:   This is a routine wellness examination for Alicia [name].  Hearing/Vision screen Hearing Screening - Comments:: No issues Vision Screening - Comments:: Last eye exam-07/2020-[facility]  Dietary issues and exercise  activities discussed: Current Exercise Habits: Home exercise routine, Type of exercise: yoga (chair yoga), Time (Minutes): 30, Frequency (Times/Week): 3, Weekly Exercise (Minutes/Week): 90, Intensity: Mild   Goals Addressed             This Visit's Progress    Patient Stated       Maintain current health & increase walking       Depression Screen PHQ 2/9 Scores 08/16/2021 05/22/2017  PHQ - 2 Score 0 0    Fall Risk Fall Risk  08/16/2021 05/22/2017  Falls in the past year? 0 No  Number falls in past yr: 0 -  Injury with Fall? 0 -  Follow up Falls prevention discussed -    FALL RISK PREVENTION PERTAINING TO THE HOME:  Any stairs in or around the home? Yes  If so, are there any without handrails? No  Home free of loose throw rugs in walkways, pet beds, electrical cords, etc? Yes  Adequate lighting in your home to reduce risk of falls? Yes   ASSISTIVE DEVICES UTILIZED TO PREVENT FALLS:  Life alert? No  Use of a cane, walker or w/c? No  Grab bars in the bathroom? Yes  Shower chair or bench in shower? Yes  Elevated toilet seat or a handicapped toilet? Yes elevated seat  TIMED UP AND GO:  Was the test performed? No . Phone visit   Cognitive Function:Normal cognitive status assessed by this Nurse Health Advisor. No abnormalities found.          Immunizations Immunization History  Administered Date(s) Administered   Hepatitis A, Adult 02/05/2017, 07/08/2017   Hepatitis B, adult 07/08/2017, 09/15/2017   Influenza Inj Mdck Quad Pf 07/15/2019   Influenza Split 07/01/2018   Influenza, Quadrivalent, Recombinant, Inj, Pf 07/11/2016, 07/15/2017, 07/01/2018   Influenza-Unspecified 07/11/2016, 07/15/2017, 07/15/2019   PFIZER Comirnaty(Gray Top)Covid-19 Tri-Sucrose Vaccine 06/07/2020   Pneumococcal Conjugate-13 09/04/2012, 09/08/2014   Pneumococcal Polysaccharide-23 09/15/2017   Tdap 06/27/2016    TDAP status: Up to date  Flu Vaccine status: Due, Education has been  provided regarding the importance of this vaccine. Advised may receive this vaccine at local pharmacy or Health Dept. Aware to provide a copy of the vaccination record if obtained  from local pharmacy or Health Dept. Verbalized acceptance and understanding.  Pneumococcal vaccine status: Up to date  Covid-19 vaccine status: Information provided on how to obtain vaccines.   Qualifies for Shingles Vaccine? Yes   Zostavax completed No   Shingrix Completed?: No.    Education has been provided regarding the importance of this vaccine. Patient has been advised to call insurance company to determine out of pocket expense if they have not yet received this vaccine. Advised may also receive vaccine at local pharmacy or Health Dept. Verbalized acceptance and understanding.  Screening Tests Health Maintenance  Topic Date Due   Zoster Vaccines- Shingrix (1 of 2) Never done   COVID-19 Vaccine (2 - Pfizer risk series) 06/28/2020   INFLUENZA VACCINE  04/30/2021   Pneumococcal Vaccine 31-3 Years old (3 - PPSV23 if available, else PCV20) 09/15/2022   MAMMOGRAM  12/01/2022   PAP SMEAR-Modifier  04/30/2024   TETANUS/TDAP  06/27/2026   COLONOSCOPY (Pts 45-61yrs Insurance coverage will need to be confirmed)  07/06/2029   Hepatitis C Screening  Completed   HIV Screening  Completed   HPV VACCINES  Aged Out    Health Maintenance  Health Maintenance Due  Topic Date Due   Zoster Vaccines- Shingrix (1 of 2) Never done   COVID-19 Vaccine (2 - Pfizer risk series) 06/28/2020   INFLUENZA VACCINE  04/30/2021    Colorectal cancer screening: Type of screening: Colonoscopy. Completed 07/07/2019. Repeat every 10 years  Mammogram status: Completed bilateral 11/30/2020. Repeat every year  Bone Density status:Not yet indicates  Lung Cancer Screening: (Low Dose CT Chest recommended if Age 24-80 years, 30 pack-year currently smoking OR have quit w/in 15years.) does not qualify.     Additional Screening:  Hepatitis  C Screening: Completed 09/30/2013  Vision Screening: Recommended annual ophthalmology exams for early detection of glaucoma and other disorders of the eye. Is the patient up to date with their annual eye exam?  Yes  Who is the provider or what is the name of the office in which the patient attends annual eye exams? [facility]   Dental Screening: Recommended annual dental exams for proper oral hygiene  Community Resource Referral / Chronic Care Management: CRR required this visit?  No   CCM required this visit?  No      Plan:     I have personally reviewed and noted the following in the patient's chart:   Medical and social history Use of alcohol, tobacco or illicit drugs  Current medications and supplements including opioid prescriptions. Patient is not currently taking opioid prescriptions. Functional ability and status Nutritional status Physical activity Advanced directives List of other physicians Hospitalizations, surgeries, and ER visits in previous 12 months Vitals Screenings to include cognitive, depression, and falls Referrals and appointments  In addition, I have reviewed and discussed with patient certain preventive protocols, quality metrics, and best practice recommendations. A written personalized care plan for preventive services as well as general preventive health recommendations were provided to patient.   Due to this being a telephonic visit, the after visit summary with patients personalized plan was offered to patient via mail or my-chart.  Patient would like to access on my-chart.   Marta Antu, LPN   [id number]  Nurse Health Advisor  Nurse Notes: None

## 2021-08-27 ENCOUNTER — Other Ambulatory Visit: Payer: Self-pay | Admitting: Family Medicine

## 2021-08-27 DIAGNOSIS — Z944 Liver transplant status: Secondary | ICD-10-CM

## 2021-08-28 ENCOUNTER — Encounter: Payer: Self-pay | Admitting: Family Medicine

## 2021-08-31 ENCOUNTER — Other Ambulatory Visit (INDEPENDENT_AMBULATORY_CARE_PROVIDER_SITE_OTHER): Payer: Medicare Other

## 2021-08-31 DIAGNOSIS — Z944 Liver transplant status: Secondary | ICD-10-CM | POA: Diagnosis not present

## 2021-08-31 LAB — COMPREHENSIVE METABOLIC PANEL
ALT: 6 U/L (ref 0–35)
AST: 10 U/L (ref 0–37)
Albumin: 4.3 g/dL (ref 3.5–5.2)
Alkaline Phosphatase: 48 U/L (ref 39–117)
BUN: 27 mg/dL — ABNORMAL HIGH (ref 6–23)
CO2: 25 mEq/L (ref 19–32)
Calcium: 9.1 mg/dL (ref 8.4–10.5)
Chloride: 107 mEq/L (ref 96–112)
Creatinine, Ser: 1.68 mg/dL — ABNORMAL HIGH (ref 0.40–1.20)
GFR: 32.33 mL/min — ABNORMAL LOW (ref >60.00)
Glucose, Bld: 94 mg/dL (ref 70–99)
Potassium: 4.5 mEq/L (ref 3.5–5.1)
Sodium: 139 mEq/L (ref 135–145)
Total Bilirubin: 0.6 mg/dL (ref 0.2–1.2)
Total Protein: 6.3 g/dL (ref 6.0–8.3)

## 2021-08-31 LAB — CBC WITH DIFFERENTIAL/PLATELET
Basophils Absolute: 0 10*3/uL (ref 0.0–0.1)
Basophils Relative: 0.4 % (ref 0.0–3.0)
Eosinophils Absolute: 0 10*3/uL (ref 0.0–0.7)
Eosinophils Relative: 1.4 % (ref 0.0–5.0)
HCT: 34.6 % — ABNORMAL LOW (ref 36.0–46.0)
Hemoglobin: 12 g/dL (ref 12.0–15.0)
Lymphocytes Relative: 41.8 % (ref 12.0–46.0)
Lymphs Abs: 1.5 10*3/uL (ref 0.7–4.0)
MCHC: 34.7 g/dL (ref 30.0–36.0)
MCV: 89.2 fl (ref 78.0–100.0)
Monocytes Absolute: 0.3 10*3/uL (ref 0.1–1.0)
Monocytes Relative: 9 % (ref 3.0–12.0)
Neutro Abs: 1.7 10*3/uL (ref 1.4–7.7)
Neutrophils Relative %: 47.4 % (ref 43.0–77.0)
Platelets: 162 10*3/uL (ref 150.0–400.0)
RBC: 3.87 Mil/uL (ref 3.87–5.11)
RDW: 13.2 % (ref 11.5–15.5)
WBC: 3.6 10*3/uL — ABNORMAL LOW (ref 4.0–10.5)

## 2021-08-31 LAB — CORTISOL: Cortisol, Plasma: 9 ug/dL

## 2021-09-03 LAB — PROTEIN / CREATININE RATIO, URINE
Creatinine, Urine: 55 mg/dL (ref 20–275)
Protein/Creat Ratio: 182 mg/g creat (ref 24–184)
Protein/Creatinine Ratio: 0.182 mg/mg creat (ref 0.024–0.184)
Total Protein, Urine: 10 mg/dL (ref 5–24)

## 2021-09-03 LAB — TACROLIMUS,HIGHLY SENSITIVE,LC/MS/MS: Tacrolimus Lvl: 5.6 mcg/L

## 2021-09-03 LAB — ACTH: C206 ACTH: 42 pg/mL (ref 6–50)

## 2021-09-03 NOTE — Progress Notes (Signed)
Labs faxed

## 2021-09-06 DIAGNOSIS — R053 Chronic cough: Secondary | ICD-10-CM | POA: Diagnosis not present

## 2021-09-27 DIAGNOSIS — I10 Essential (primary) hypertension: Secondary | ICD-10-CM | POA: Diagnosis not present

## 2021-09-27 DIAGNOSIS — N1419 Nephropathy induced by other drugs, medicaments and biological substances: Secondary | ICD-10-CM | POA: Diagnosis not present

## 2021-09-27 DIAGNOSIS — Z48298 Encounter for aftercare following other organ transplant: Secondary | ICD-10-CM | POA: Diagnosis not present

## 2021-09-27 DIAGNOSIS — N1832 Chronic kidney disease, stage 3b: Secondary | ICD-10-CM | POA: Diagnosis not present

## 2021-09-27 DIAGNOSIS — T451X5A Adverse effect of antineoplastic and immunosuppressive drugs, initial encounter: Secondary | ICD-10-CM | POA: Diagnosis not present

## 2021-10-19 DIAGNOSIS — R490 Dysphonia: Secondary | ICD-10-CM | POA: Diagnosis not present

## 2021-10-19 DIAGNOSIS — J310 Chronic rhinitis: Secondary | ICD-10-CM | POA: Diagnosis not present

## 2021-10-19 DIAGNOSIS — R053 Chronic cough: Secondary | ICD-10-CM | POA: Diagnosis not present

## 2021-10-25 ENCOUNTER — Encounter: Payer: Self-pay | Admitting: Family Medicine

## 2021-10-25 DIAGNOSIS — Z944 Liver transplant status: Secondary | ICD-10-CM

## 2021-10-26 ENCOUNTER — Institutional Professional Consult (permissible substitution): Payer: Medicare Other | Admitting: Pulmonary Disease

## 2021-10-26 DIAGNOSIS — R7989 Other specified abnormal findings of blood chemistry: Secondary | ICD-10-CM | POA: Diagnosis not present

## 2021-10-29 ENCOUNTER — Other Ambulatory Visit (INDEPENDENT_AMBULATORY_CARE_PROVIDER_SITE_OTHER): Payer: Medicare Other

## 2021-10-29 DIAGNOSIS — R053 Chronic cough: Secondary | ICD-10-CM | POA: Diagnosis not present

## 2021-10-29 DIAGNOSIS — Z944 Liver transplant status: Secondary | ICD-10-CM

## 2021-10-29 DIAGNOSIS — R490 Dysphonia: Secondary | ICD-10-CM | POA: Diagnosis not present

## 2021-10-29 LAB — CBC WITH DIFFERENTIAL/PLATELET
Basophils Absolute: 0 10*3/uL (ref 0.0–0.1)
Basophils Relative: 0.5 % (ref 0.0–3.0)
Eosinophils Absolute: 0 10*3/uL (ref 0.0–0.7)
Eosinophils Relative: 1 % (ref 0.0–5.0)
HCT: 34.7 % — ABNORMAL LOW (ref 36.0–46.0)
Hemoglobin: 11.8 g/dL — ABNORMAL LOW (ref 12.0–15.0)
Lymphocytes Relative: 39 % (ref 12.0–46.0)
Lymphs Abs: 1.6 10*3/uL (ref 0.7–4.0)
MCHC: 34 g/dL (ref 30.0–36.0)
MCV: 89.6 fl (ref 78.0–100.0)
Monocytes Absolute: 0.4 10*3/uL (ref 0.1–1.0)
Monocytes Relative: 9.1 % (ref 3.0–12.0)
Neutro Abs: 2 10*3/uL (ref 1.4–7.7)
Neutrophils Relative %: 50.4 % (ref 43.0–77.0)
Platelets: 168 10*3/uL (ref 150.0–400.0)
RBC: 3.87 Mil/uL (ref 3.87–5.11)
RDW: 13.2 % (ref 11.5–15.5)
WBC: 4 10*3/uL (ref 4.0–10.5)

## 2021-10-29 LAB — COMPREHENSIVE METABOLIC PANEL
ALT: 5 U/L (ref 0–35)
AST: 9 U/L (ref 0–37)
Albumin: 4.2 g/dL (ref 3.5–5.2)
Alkaline Phosphatase: 50 U/L (ref 39–117)
BUN: 18 mg/dL (ref 6–23)
CO2: 26 mEq/L (ref 19–32)
Calcium: 9.1 mg/dL (ref 8.4–10.5)
Chloride: 106 mEq/L (ref 96–112)
Creatinine, Ser: 1.87 mg/dL — ABNORMAL HIGH (ref 0.40–1.20)
GFR: 28.4 mL/min — ABNORMAL LOW (ref >60.00)
Glucose, Bld: 102 mg/dL — ABNORMAL HIGH (ref 70–99)
Potassium: 4.7 mEq/L (ref 3.5–5.1)
Sodium: 140 mEq/L (ref 135–145)
Total Bilirubin: 0.5 mg/dL (ref 0.2–1.2)
Total Protein: 6.4 g/dL (ref 6.0–8.3)

## 2021-10-29 LAB — CORTISOL: Cortisol, Plasma: 8 ug/dL

## 2021-10-29 NOTE — Addendum Note (Signed)
Addended by: Manuela Schwartz on: 10/29/2021 09:23 AM   Modules accepted: Orders

## 2021-10-31 LAB — TACROLIMUS,HIGHLY SENSITIVE,LC/MS/MS: Tacrolimus Lvl: 5.4 mcg/L

## 2021-11-01 LAB — ACTH: C206 ACTH: 31 pg/mL (ref 6–50)

## 2021-11-02 ENCOUNTER — Encounter: Payer: Self-pay | Admitting: Pulmonary Disease

## 2021-11-02 ENCOUNTER — Other Ambulatory Visit: Payer: Self-pay

## 2021-11-02 ENCOUNTER — Ambulatory Visit: Payer: Medicare Other | Admitting: Pulmonary Disease

## 2021-11-02 VITALS — BP 124/80 | HR 88 | Ht 62.0 in | Wt 159.6 lb

## 2021-11-02 DIAGNOSIS — D869 Sarcoidosis, unspecified: Secondary | ICD-10-CM | POA: Diagnosis not present

## 2021-11-02 DIAGNOSIS — R053 Chronic cough: Secondary | ICD-10-CM | POA: Diagnosis not present

## 2021-11-02 DIAGNOSIS — J449 Chronic obstructive pulmonary disease, unspecified: Secondary | ICD-10-CM | POA: Diagnosis not present

## 2021-11-02 MED ORDER — YUPELRI 175 MCG/3ML IN SOLN: 175.0000 ug | Freq: Every day | RESPIRATORY_TRACT | 0 refills | Status: DC

## 2021-11-02 NOTE — Patient Instructions (Addendum)
Continue to use budesonide nebulizer treatments twice daily  Continue to use brovana nebulizer treatments twice daily  Try yupelri nebulizer treatments daily - if you notice improvement then let us know and we will send in a prescription  Use albuterol inhaler as needed  We will schedule you for a high resolution chest scan to evaluate for tracheobronchomalacia that could be leading to your on going cough issues.

## 2021-11-02 NOTE — Progress Notes (Signed)
Synopsis: Referred in February 2023 for Sarcoidosis by Lamar Blinks, MD  Subjective:   PATIENT ID: Alicia [name] GENDER: female DOB: 1960/11/30, MRN: [id number]   HPI  Chief Complaint  Patient presents with   Consult    Referred by PCP for history of sarcoidosis. Was established at [facility] but decided to switch to someone locally.    Alicia [name] is a 61 year old woman, never smoker with GERD, cirrhosis s/p liver transplant, chronic cough and asthma who is referred for sarcoidosis.   She has been followed at the [facility] pulmonary clinic by Dr. Zigmund Daniel and most recently by Dr. Daneil Dolin for her asthma and history of sarcoidosis, and wishes to establish care locally.  Records reviewed in care everywhere.  She was recently transitioned from inhaler therapy to long-acting nebulizer treatments with improvement in her breathing and coughing episodes.  She still reports having 6-8 coughing fits per day.  She also experiences intermittent wheezing.  She is currently taking budesonide and Brovana.  She has as needed albuterol inhaler which does provide relief.  She was diagnosed with sarcoidosis in 2017 via transbronchial biopsies from various segments of the right lower lobe.  CT chest scan from 11/2020 shows peribronchovascular and perifissural nodularity bilaterally with similar distribution as before with some enlargement of various nodules.  Right apical nodule is 6 mm.  Right lower lobe subpleural pulmonary nodule is 5 mm.  There is new bibasilar scarring and mild volume loss.  She tries to remain as active as possible.  She enjoys gardening in her backyard.  She is accompanied by her husband today.  Past Medical History:  Diagnosis Date   Allergy    Anemia    Asthma    Cellulitis    Cirrhosis (HCC)    GERD (gastroesophageal reflux disease)    Hepatic fibrosis    congenital   History of UTI    chronic as a child   Sarcoidosis    Spleen enlarged      Family History  Problem  Relation Age of Onset   Arthritis Mother    Heart disease Father        Triple bypass   Migraines Father    Heart attack Paternal Grandmother    Stroke Paternal Grandfather    Liver disease Sister        Juvenile cysitc liver disease   Asthma Sister      Social History   Socioeconomic History   Marital status: Married    Spouse name: Not on file   Number of children: Not on file   Years of education: Not on file   Highest education level: Not on file  Occupational History   Occupation: HR Manager    Employer: [employer]  Tobacco Use   Smoking status: Never   Smokeless tobacco: Never  Substance and Sexual Activity   Alcohol use: No   Drug use: No   Sexual activity: Not on file  Other Topics Concern   Not on file  Social History Narrative   Not on file   Social Determinants of Health   Financial Resource Strain: Low Risk    Difficulty of Paying Living Expenses: Not hard at all  Food Insecurity: No Food Insecurity   Worried About Charity fundraiser in the Last Year: Never true   [place] in the Last Year: Never true  Transportation Needs: No Transportation Needs   Lack of Transportation (Medical): No   Lack of Transportation (Non-Medical):  No  Physical Activity: Insufficiently Active   Days of Exercise per Week: 3 days   Minutes of Exercise per Session: 30 min  Stress: No Stress Concern Present   Feeling of Stress : Not at all  Social Connections: Socially Integrated   Frequency of Communication with Friends and Family: More than three times a week   Frequency of Social Gatherings with Friends and Family: More than three times a week   Attends Religious Services: 1 to 4 times per year   Active Member of [employer] or Organizations: Yes   Attends Music therapist: More than 4 times per year   Marital Status: Married  Human resources officer Violence: Not At Risk   Fear of Current or Ex-Partner: No   Emotionally Abused: No   Physically Abused: No    Sexually Abused: No     Allergies  Allergen Reactions   Aspirin Other (See Comments)    Causes Asthma   Peanut-Containing Drug Products Shortness Of Breath    Asthma, eye swelling, itchy eyes, can't catch her breath   Gabapentin Other (See Comments)    Blisters in mouth, nausea    Statins     Asthma, eye swelling, itchy eyes, can't catch her breath   Azithromycin Nausea And Vomiting     Outpatient Medications Prior to Visit  Medication Sig Dispense Refill   arformoterol (BROVANA) 15 MCG/2ML NEBU Inhale into the lungs 2 (two) times daily.     azelastine (ASTELIN) 0.1 % nasal spray Place 1 spray into both nostrils 2 (two) times daily. Use in each nostril as directed     budesonide (PULMICORT) 0.25 MG/2ML nebulizer solution Inhale 0.25 mg into the lungs 2 (two) times daily.     bumetanide (BUMEX) 1 MG tablet Take 1 mg by mouth daily.     calcium citrate-vitamin D (CITRACAL+D) 315-200 MG-UNIT tablet Take 1 tablet by mouth 2 (two) times daily.     dipyridamole (PERSANTINE) 50 MG tablet Take 50 mg by mouth daily.     hydrocortisone (CORTEF) 10 MG tablet Take by mouth. 15 mg in morning, 5 mg at bedtime     ipratropium-albuterol (DUONEB) 0.5-2.5 (3) MG/3ML SOLN Take 3 mLs by nebulization every 4 (four) hours as needed. 360 mL 0   losartan (COZAAR) 25 MG tablet Take 25 mg by mouth daily.     magnesium oxide (MAG-OX) 400 MG tablet Take 400 mg by mouth 2 (two) times daily.     pantoprazole (PROTONIX) 40 MG tablet Take 40 mg by mouth daily.     tacrolimus (PROGRAF) 1 MG capsule Take 1 mg by mouth 2 (two) times a day. TAKE 2 TABLETS (3MG  TOTAL) IN THE MORNING AND THREE TABLETS (3MG  TOTAL) IN THE EVENING.     UNABLE TO FIND 25 mg. Med Name: Verapamil 25 mg daily     verapamil (CALAN-SR) 120 MG CR tablet Take 120 mg by mouth daily.     albuterol (PROVENTIL HFA;VENTOLIN HFA) 108 (90 Base) MCG/ACT inhaler Inhale 1-2 puffs into the lungs every 6 (six) hours as needed for wheezing or shortness of  breath. 1 Inhaler 0   SPIRIVA RESPIMAT 2.5 MCG/ACT AERS Inhale 2 puffs into the lungs daily at 2 PM.   11   No facility-administered medications prior to visit.   Review of Systems  Constitutional:  Negative for chills, fever, malaise/fatigue and weight loss.  HENT:  Negative for congestion, sinus pain and sore throat.   Eyes: Negative.   Respiratory:  Positive  for cough, shortness of breath and wheezing. Negative for hemoptysis and sputum production.   Cardiovascular:  Negative for chest pain, palpitations, orthopnea, claudication and leg swelling.  Gastrointestinal:  Negative for abdominal pain, heartburn, nausea and vomiting.  Genitourinary: Negative.   Musculoskeletal:  Negative for joint pain and myalgias.  Skin:  Negative for rash.  Neurological:  Negative for weakness.  Endo/Heme/Allergies: Negative.   Psychiatric/Behavioral: Negative.     Objective:   Vitals:   11/02/21 1104  BP: 124/80  Pulse: 88  SpO2: 99%  Weight: 159 lb 9.6 oz (72.4 kg)  Height: 5\' 2"  (1.575 m)   Physical Exam Constitutional:      General: She is not in acute distress.    Appearance: She is not ill-appearing.  HENT:     Head: Normocephalic and atraumatic.     Nose: Nose normal. No congestion.     Mouth/Throat:     Mouth: Mucous membranes are moist.     Pharynx: Oropharynx is clear. No oropharyngeal exudate.  Eyes:     General: No scleral icterus.    Conjunctiva/sclera: Conjunctivae normal.     Pupils: Pupils are equal, round, and reactive to light.  Cardiovascular:     Rate and Rhythm: Normal rate and regular rhythm.     Pulses: Normal pulses.     Heart sounds: Normal heart sounds. No murmur heard. Pulmonary:     Effort: Pulmonary effort is normal.     Breath sounds: Normal breath sounds. No wheezing, rhonchi or rales.  Abdominal:     General: Bowel sounds are normal.     Palpations: Abdomen is soft.  Musculoskeletal:     Right lower leg: No edema.     Left lower leg: No edema.   Lymphadenopathy:     Cervical: No cervical adenopathy.  Skin:    General: Skin is warm and dry.  Neurological:     General: No focal deficit present.     Mental Status: She is alert.  Psychiatric:        Mood and Affect: Mood normal.        Behavior: Behavior normal.        Thought Content: Thought content normal.        Judgment: Judgment normal.   CBC    Component Value Date/Time   WBC 4.0 10/29/2021 0926   RBC 3.87 10/29/2021 0926   HGB 11.8 (L) 10/29/2021 0926   HGB 11.1 09/21/2018 1149   HCT 34.7 (L) 10/29/2021 0926   HCT 31.2 (L) 09/21/2018 1149   PLT 168.0 10/29/2021 0926   PLT 162 09/21/2018 1149   MCV 89.6 10/29/2021 0926   MCV 87 09/21/2018 1149   MCH 31.0 07/12/2020 0820   MCHC 34.0 10/29/2021 0926   RDW 13.2 10/29/2021 0926   RDW 13.7 09/21/2018 1149   LYMPHSABS 1.6 10/29/2021 0926   LYMPHSABS 1.1 09/21/2018 1149   MONOABS 0.4 10/29/2021 0926   EOSABS 0.0 10/29/2021 0926   EOSABS 0.1 09/21/2018 1149   BASOSABS 0.0 10/29/2021 0926   BASOSABS 0.0 09/21/2018 1149   BMP Latest Ref Rng & Units 10/29/2021 08/31/2021 08/03/2021  Glucose 70 - 99 mg/dL 102(H) 94 91  BUN 6 - 23 mg/dL 18 27(H) 26(H)  Creatinine 0.40 - 1.20 mg/dL 1.87(H) 1.68(H) 1.95(H)  BUN/Creat Ratio 6 - 22 (calc) - - -  Sodium 135 - 145 mEq/L 140 139 141  Potassium 3.5 - 5.1 mEq/L 4.7 4.5 4.9  Chloride 96 - 112 mEq/L 106 107 108  CO2 19 - 32 mEq/L 26 25 27   Calcium 8.4 - 10.5 mg/dL 9.1 9.1 8.7   Chest imaging: CT Chest wo contrast 11/30/20 1. Peri lymphatic distribution pulmonary nodules, likely related to the clinical history of sarcoidosis. Some nodules have increased in size since 2018, suggesting progression of inflammation. 2. No acute superimposed process. 3. Cirrhosis and portal venous hypertension with gastroesophageal varices, as before. 4.  Aortic Atherosclerosis  PFT: PFT Results Latest Ref Rng & Units 03/20/2016  FVC-Pre L 2.23  FVC-Predicted Pre % 68  FVC-Post L 2.53   FVC-Predicted Post % 77  Pre FEV1/FVC % % 58  Post FEV1/FCV % % 60  FEV1-Pre L 1.29  FEV1-Predicted Pre % 51  FEV1-Post L 1.52  DLCO uncorrected ml/min/mmHg 18.50  DLCO UNC% % 80  DLVA Predicted % 97  TLC L 8.53  TLC % Predicted % 174  RV % Predicted % 293  PFT 2017: Moderately severe obstruction, gas trapping present, normal diffusion  Labs:  Path:  Echo:  Heart Catheterization:  Assessment & Plan:   Chronic cough - Plan: CT CHEST HIGH RESOLUTION  Asthma with chronic obstructive pulmonary disease (COPD) ([place]) - Plan: CT CHEST HIGH RESOLUTION  Sarcoidosis - Plan: CT CHEST HIGH RESOLUTION  Discussion: Alicia [name] is a 61 year old woman, never smoker with GERD, cirrhosis s/p liver transplant, chronic cough and asthma who is referred for sarcoidosis.   Patient's chronic cough and asthma symptoms are better controlled since switching to nebulizer treatments.  She is to continue budesonide and Brovana nebulizer treatments twice daily.  We have provided her with samples of Yupelri nebulizer treatment to be used daily.  If she notes improvement we will then send in prescription for her to continue on this medication.  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  We will further evaluate with high-resolution CT scan.  She is to continue Astelin for chronic nasal congestion and postnasal drainage.  I have recommended trying Nasacort nasal spray as she had burning side effects from fluticasone nasal spray.  Follow-up in 6 months.  Freda Jackson, MD [facility] Office: [phone]   Current Outpatient Medications:    arformoterol (BROVANA) 15 MCG/2ML NEBU, Inhale into the lungs 2 (two) times daily., Disp: , Rfl:    azelastine (ASTELIN) 0.1 % nasal spray, Place 1 spray into both nostrils 2 (two) times daily. Use in each nostril as directed, Disp: , Rfl:    budesonide (PULMICORT) 0.25 MG/2ML nebulizer  solution, Inhale 0.25 mg into the lungs 2 (two) times daily., Disp: , Rfl:    bumetanide (BUMEX) 1 MG tablet, Take 1 mg by mouth daily., Disp: , Rfl:    calcium citrate-vitamin D (CITRACAL+D) 315-200 MG-UNIT tablet, Take 1 tablet by mouth 2 (two) times daily., Disp: , Rfl:    dipyridamole (PERSANTINE) 50 MG tablet, Take 50 mg by mouth daily., Disp: , Rfl:    hydrocortisone (CORTEF) 10 MG tablet, Take by mouth. 15 mg in morning, 5 mg at bedtime, Disp: , Rfl:    ipratropium-albuterol (DUONEB) 0.5-2.5 (3) MG/3ML SOLN, Take 3 mLs by nebulization every 4 (four) hours as needed., Disp: 360 mL, Rfl: 0   losartan (COZAAR) 25 MG tablet, Take 25 mg by mouth daily., Disp: , Rfl:    magnesium oxide (MAG-OX) 400 MG tablet, Take 400 mg by mouth 2 (two) times daily., Disp: , Rfl:    pantoprazole (PROTONIX) 40 MG tablet, Take 40 mg by mouth daily., Disp: ,  Rfl:    revefenacin (YUPELRI) 175 MCG/3ML nebulizer solution, Take 3 mLs (175 mcg total) by nebulization daily., Disp: 14 mL, Rfl: 0   tacrolimus (PROGRAF) 1 MG capsule, Take 1 mg by mouth 2 (two) times a day. TAKE 2 TABLETS (3MG  TOTAL) IN THE MORNING AND THREE TABLETS (3MG  TOTAL) IN THE EVENING., Disp: , Rfl:    UNABLE TO FIND, 25 mg. Med Name: Verapamil 25 mg daily, Disp: , Rfl:    verapamil (CALAN-SR) 120 MG CR tablet, Take 120 mg by mouth daily., Disp: , Rfl:

## 2021-11-05 DIAGNOSIS — R053 Chronic cough: Secondary | ICD-10-CM | POA: Diagnosis not present

## 2021-11-05 DIAGNOSIS — R490 Dysphonia: Secondary | ICD-10-CM | POA: Diagnosis not present

## 2021-11-12 ENCOUNTER — Encounter: Payer: Self-pay | Admitting: Pulmonary Disease

## 2021-11-12 MED ORDER — YUPELRI 175 MCG/3ML IN SOLN
175.0000 ug | Freq: Every day | RESPIRATORY_TRACT | 6 refills | Status: DC
Start: 2021-11-12 — End: 2021-11-20

## 2021-11-12 NOTE — Telephone Encounter (Signed)
Dr. Erin Fulling please advise on patient mychart message. Thank you    I just want to say I'm so happy yo have found you as my provider. The yupelri samples you provided to me have helped a lot.  Cherena gave me a two week sample which will run out later this week. Could you send that test script to my pharmacy ([facility] [place])? Also, I forgot to tell you about a different medication that Dr Daneil Dolin had prescribed  - levocetirizine 5 MG to take in the evening.    Thank you. Alicia [name]  DOB 20-Dec-2060     RX has been sent to preferred pharmacy. Advised her to let us know if there is an issues.

## 2021-11-15 ENCOUNTER — Encounter: Payer: Self-pay | Admitting: Pulmonary Disease

## 2021-11-20 MED ORDER — YUPELRI 175 MCG/3ML IN SOLN: 175.0000 ug | Freq: Every day | RESPIRATORY_TRACT | 6 refills | Status: DC

## 2021-11-22 MED ORDER — YUPELRI 175 MCG/3ML IN SOLN: 175.0000 ug | Freq: Every day | RESPIRATORY_TRACT | 0 refills | Status: DC

## 2021-11-26 DIAGNOSIS — D849 Immunodeficiency, unspecified: Secondary | ICD-10-CM | POA: Diagnosis not present

## 2021-11-26 DIAGNOSIS — Z944 Liver transplant status: Secondary | ICD-10-CM | POA: Diagnosis not present

## 2021-11-29 DIAGNOSIS — H5213 Myopia, bilateral: Secondary | ICD-10-CM | POA: Diagnosis not present

## 2021-12-05 ENCOUNTER — Encounter (HOSPITAL_BASED_OUTPATIENT_CLINIC_OR_DEPARTMENT_OTHER): Payer: Self-pay | Admitting: Emergency Medicine

## 2021-12-05 ENCOUNTER — Other Ambulatory Visit: Payer: Self-pay

## 2021-12-05 ENCOUNTER — Emergency Department (HOSPITAL_BASED_OUTPATIENT_CLINIC_OR_DEPARTMENT_OTHER): Payer: Medicare Other

## 2021-12-05 ENCOUNTER — Emergency Department (HOSPITAL_BASED_OUTPATIENT_CLINIC_OR_DEPARTMENT_OTHER)
Admission: EM | Admit: 2021-12-05 | Discharge: 2021-12-05 | Disposition: A | Discharge status: Home / Self Care | Payer: Medicare Other | Attending: Emergency Medicine | Admitting: Emergency Medicine

## 2021-12-05 DIAGNOSIS — S0990XA Unspecified injury of head, initial encounter: Secondary | ICD-10-CM | POA: Diagnosis not present

## 2021-12-05 DIAGNOSIS — W108XXA Fall (on) (from) other stairs and steps, initial encounter: Secondary | ICD-10-CM | POA: Insufficient documentation

## 2021-12-05 DIAGNOSIS — M79645 Pain in left finger(s): Secondary | ICD-10-CM | POA: Insufficient documentation

## 2021-12-05 DIAGNOSIS — Y92009 Unspecified place in unspecified non-institutional (private) residence as the place of occurrence of the external cause: Secondary | ICD-10-CM | POA: Diagnosis not present

## 2021-12-05 DIAGNOSIS — N189 Chronic kidney disease, unspecified: Secondary | ICD-10-CM | POA: Diagnosis not present

## 2021-12-05 DIAGNOSIS — K7460 Unspecified cirrhosis of liver: Secondary | ICD-10-CM | POA: Diagnosis not present

## 2021-12-05 DIAGNOSIS — S99922A Unspecified injury of left foot, initial encounter: Secondary | ICD-10-CM | POA: Diagnosis present

## 2021-12-05 DIAGNOSIS — S299XXA Unspecified injury of thorax, initial encounter: Secondary | ICD-10-CM | POA: Diagnosis not present

## 2021-12-05 DIAGNOSIS — Z91010 Allergy to peanuts: Secondary | ICD-10-CM | POA: Insufficient documentation

## 2021-12-05 DIAGNOSIS — S90122A Contusion of left lesser toe(s) without damage to nail, initial encounter: Secondary | ICD-10-CM | POA: Diagnosis not present

## 2021-12-05 DIAGNOSIS — I8500 Esophageal varices without bleeding: Secondary | ICD-10-CM | POA: Diagnosis not present

## 2021-12-05 DIAGNOSIS — W19XXXA Unspecified fall, initial encounter: Secondary | ICD-10-CM

## 2021-12-05 DIAGNOSIS — M79642 Pain in left hand: Secondary | ICD-10-CM | POA: Diagnosis not present

## 2021-12-05 NOTE — Discharge Instructions (Addendum)
Your x-ray today did not show any obvious fracture of the left finger.  However it still possible you have an early fracture we cannot visualize.  We put you in a splint to protect the finger.  I would recommend that your PCP reorder your.xrays in 7-10 days of your left hand if you continue having pain at the site. ? ? ?

## 2021-12-05 NOTE — ED Provider Notes (Signed)
[facility] Provider Note   CSN: [id number] Arrival date & time: 12/05/21  1035     History  Chief Complaint  Patient presents with   Fall    Alicia [name] is a 61 y.o. female w/ hx of liver transplant at [facility], on immunosupressive therapy and steroids, hx of CKD, here with fall down stairs at home.  Accidentally slipped down stairs last night, 10 carpeted steps, landing on her mid back, striking back of head, and hurting her left 5th finger and left 2nd toe.  She can ambulate today.  Not on blood thinners.  HPI     Home Medications Prior to Admission medications   Medication Sig Start Date End Date Taking? Authorizing Provider  arformoterol (BROVANA) 15 MCG/2ML NEBU Inhale into the lungs 2 (two) times daily. 10/24/21   [provider]  azelastine (ASTELIN) 0.1 % nasal spray Place 1 spray into both nostrils 2 (two) times daily. Use in each nostril as directed    [provider]  budesonide (PULMICORT) 0.25 MG/2ML nebulizer solution Inhale 0.25 mg into the lungs 2 (two) times daily. 09/06/21 09/06/22  [provider]  bumetanide (BUMEX) 1 MG tablet Take 1 mg by mouth daily.    [provider]  calcium citrate-vitamin D (CITRACAL+D) 315-200 MG-UNIT tablet Take 1 tablet by mouth 2 (two) times daily.    [provider]  dipyridamole (PERSANTINE) 50 MG tablet Take 50 mg by mouth daily.    [provider]  hydrocortisone (CORTEF) 10 MG tablet Take by mouth. 15 mg in morning, 5 mg at bedtime 04/27/21   [provider]  ipratropium-albuterol (DUONEB) 0.5-2.5 (3) MG/3ML SOLN Take 3 mLs by nebulization every 4 (four) hours as needed. 06/30/17   Copland, Gay Filler, MD  losartan (COZAAR) 25 MG tablet Take 25 mg by mouth daily.    [provider]  magnesium oxide (MAG-OX) 400 MG tablet Take 400 mg by mouth 2 (two) times daily.    [provider]  pantoprazole (PROTONIX) 40 MG tablet Take 40  mg by mouth daily.    [provider]  revefenacin (YUPELRI) 175 MCG/3ML nebulizer solution Take 3 mLs (175 mcg total) by nebulization daily. 11/20/21   Freddi Starr, MD  revefenacin (YUPELRI) 175 MCG/3ML nebulizer solution Take 3 mLs (175 mcg total) by nebulization daily. 11/22/21   Freddi Starr, MD  tacrolimus (PROGRAF) 1 MG capsule Take 1 mg by mouth 2 (two) times a day. TAKE 2 TABLETS ('3MG'$  TOTAL) IN THE MORNING AND THREE TABLETS ('3MG'$  TOTAL) IN THE EVENING.    [provider]  UNABLE TO FIND 25 mg. Med Name: Verapamil 25 mg daily    [provider]  verapamil (CALAN-SR) 120 MG CR tablet Take 120 mg by mouth daily. 10/25/21   [provider]      Allergies    Aspirin, Peanut-containing drug products, Gabapentin, Statins, and Azithromycin    Review of Systems   Review of Systems  Physical Exam Updated Vital Signs BP (!) 154/102 (BP Location: Right Arm)    Pulse 85    Temp 98.5 F (36.9 C) (Oral)    Resp 16    SpO2 98%  Physical Exam Constitutional:      General: She is not in acute distress. HENT:     Head: Normocephalic and atraumatic.  Eyes:     Conjunctiva/sclera: Conjunctivae normal.     Pupils: Pupils are equal, round, and reactive to light.  Neck:  Comments: Thoracic midline tenderness only (no other spinal ttp) Cardiovascular:     Rate and Rhythm: Normal rate and regular rhythm.  Pulmonary:     Effort: Pulmonary effort is normal. No respiratory distress.  Abdominal:     General: There is no distension.     Tenderness: There is no abdominal tenderness.  Musculoskeletal:     Comments: Ecchymosis of left 2nd distal toe Eccymosis and tenderness of left 5th proximal finger joint (MCP) No posterior tenderness of the lateral or medial malleoli No tenderness to palpation at the base of the 5th metatarsal No tenderness of the midfoot near navicula  Patient able to bear weight following injury and in the ED   Skin:    General:  Skin is warm and dry.  Neurological:     General: No focal deficit present.     Mental Status: She is alert. Mental status is at baseline.  Psychiatric:        Mood and Affect: Mood normal.        Behavior: Behavior normal.    ED Results / Procedures / Treatments   Labs (all labs ordered are listed, but only abnormal results are displayed) Labs Reviewed - No data to display  EKG None  Radiology CT Head Wo Contrast  Result Date: 12/05/2021 CLINICAL DATA:  Head trauma, moderate-severe EXAM: CT HEAD WITHOUT CONTRAST TECHNIQUE: Contiguous axial images were obtained from the base of the skull through the vertex without intravenous contrast. RADIATION DOSE REDUCTION: This exam was performed according to the departmental dose-optimization program which includes automated exposure control, adjustment of the mA and/or kV according to patient size and/or use of iterative reconstruction technique. COMPARISON:  None. FINDINGS: Brain: There is no acute intracranial hemorrhage, mass effect, or edema. Gray-white differentiation is preserved. There is no extra-axial fluid collection. Ventricles and sulci are within normal limits in size and configuration. Vascular: There is mild atherosclerotic calcification at the skull base. Skull: Calvarium is unremarkable. Sinuses/Orbits: No acute finding. Other: None. IMPRESSION: No evidence of acute intracranial injury. Electronically Signed   By: Macy Mis M.D.   On: 12/05/2021 11:57   CT Thoracic Spine Wo Contrast  Result Date: 12/05/2021 CLINICAL DATA:  Back trauma, no prior imaging (Age >= 16y); fall down steps EXAM: CT THORACIC SPINE WITHOUT CONTRAST TECHNIQUE: Multidetector CT images of the thoracic were obtained using the standard protocol without intravenous contrast. RADIATION DOSE REDUCTION: This exam was performed according to the departmental dose-optimization program which includes automated exposure control, adjustment of the mA and/or kV according to  patient size and/or use of iterative reconstruction technique. COMPARISON:  Correlation made with prior chest CT FINDINGS: Alignment: Preserved. Vertebrae: Thoracic vertebral body heights are maintained. No acute fracture. Paraspinal and other soft tissues: No paraspinal hematoma. Scattered pulmonary nodules are probably related to history of sarcoidosis. 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 No high-grade osseous encroachment on the spinal canal or neural foramina. IMPRESSION: No acute fracture of the thoracic spine. Electronically Signed   By: Macy Mis M.D.   On: 12/05/2021 11:55   DG Hand Complete Left  Result Date: 12/05/2021 CLINICAL DATA:  Left fifth metacarpal tenderness after fall. EXAM: LEFT HAND - COMPLETE 3 VIEW COMPARISON:  None. FINDINGS: There is no evidence of fracture or dislocation. Generalized osteopenia. Interphalangeal osteoarthritis especially affecting the distal interphalangeal joints with some erosive changes at the index and middle finger. IMPRESSION: No acute finding. Electronically Signed  By: Jorje Guild M.D.   On: 12/05/2021 11:26    Procedures Procedures    Medications Ordered in ED Medications - No data to display  ED Course/ Medical Decision Making/ A&P Clinical Course as of 12/05/21 1801  Wed Dec 05, 2021  1210 I reviewed the imaging with the patient and her husband.  No visible fracture but she does continue have significant isolated tenderness over the left MCP of her fifth finger.  It is possible she has a fracture that is not yet visible on x-ray.  Therefore we have ordered an ulnar gutter splint, will immobilize this finger.  If she has persistent pain a week from now her PCP will need to reorder x-ray films.  She verbalized understanding and agreement [MT]    Clinical Course User Index [MT] Antonique Langford, Carola Rhine, MD                            Medical Decision Making Amount and/or Complexity of Data Reviewed Radiology: ordered.   Mechanical fall down stairs CT imaging ordered per injuries noted on exam - no acute traumatic injuries noted on CT per my independent review Possible left 2nd toe fx; she does not want imaging; unlikely surgical issue  No other significant traumatic injuries noted on exam.  Did not feel CT chest/abd indicated at this time.  Doubt C or L spine fx. Doubt PTX or internal bleeding clinically.  Supplemental hx provided by patient's husband at bedside         Final Clinical Impression(s) / ED Diagnoses Final diagnoses:  Fall, initial encounter  Finger pain, left    Rx / DC Orders ED Discharge Orders     None         Sunshine Mackowski, Carola Rhine, MD 12/05/21 [phone]

## 2021-12-05 NOTE — ED Triage Notes (Signed)
Reports her feet went out from under her last night and she went down about 10 steps on her bottom.  Also hit her head on the way down.  C/o left hand pain and left foot (second toe).   ?

## 2021-12-06 ENCOUNTER — Encounter (HOSPITAL_BASED_OUTPATIENT_CLINIC_OR_DEPARTMENT_OTHER): Payer: Self-pay

## 2021-12-06 ENCOUNTER — Other Ambulatory Visit: Payer: Self-pay

## 2021-12-06 ENCOUNTER — Emergency Department (HOSPITAL_BASED_OUTPATIENT_CLINIC_OR_DEPARTMENT_OTHER)
Admission: EM | Admit: 2021-12-06 | Discharge: 2021-12-06 | Disposition: A | Discharge status: Home / Self Care | Payer: Medicare Other | Attending: Emergency Medicine | Admitting: Emergency Medicine

## 2021-12-06 DIAGNOSIS — M79642 Pain in left hand: Secondary | ICD-10-CM | POA: Insufficient documentation

## 2021-12-06 DIAGNOSIS — R600 Localized edema: Secondary | ICD-10-CM | POA: Insufficient documentation

## 2021-12-06 DIAGNOSIS — Z91010 Allergy to peanuts: Secondary | ICD-10-CM | POA: Insufficient documentation

## 2021-12-06 NOTE — Discharge Instructions (Signed)
It was a pleasure taking care of you today. As discussed, I suspect swelling to your hand caused the splint to be slightly too tight. Your splint was replaced. Follow-up with PCP at your scheduled appointment. Return to the ER for new or worsening symptoms.  ?

## 2021-12-06 NOTE — ED Notes (Signed)
No acute distress noted upon this RN's departure of patient. Splint verified with MD. Patient states her had feels "so much better." Verified discharge paperwork with name and DOB. Vital signs stable. Patient taken to checkout window. Discharge paperwork discussed with patient. No further questions voiced upon discharge.  ? ?

## 2021-12-06 NOTE — ED Triage Notes (Signed)
Patient arrived to ED with complaints of swelling to right hand after a splint was applied yesterday. Patient states she fell yesterday and was diagnosed with a possible finger fracture.  ?+swelling noted - bandages released at this time. Patient still has sensation and circulation but states it is now becoming painful.  ? ? ?

## 2021-12-06 NOTE — ED Provider Notes (Signed)
Alicia [name] Provider Note   CSN: [id number] Arrival date & time: 12/06/21  1002     History  No chief complaint on file.   Alicia [name] is a 61 y.o. female who presents to the ED due to left hand pain and edema.  Patient seen yesterday after a mechanical fall down 10 stairs.  Patient had an x-ray of her left hand at that time which was negative for any bony fractures however, patient was placed in an ulnar gutter splint due to possibility of fracture not visible on x-ray.  Patient notes she felt like her splint was too tight causing significant pain to her left hand and edema.  Prior to my initial evaluation, splint was cut off and patient notes improvement in symptoms.  Denies any color changes.  No numbness/tingling.       Home Medications Prior to Admission medications   Medication Sig Start Date End Date Taking? Authorizing Provider  arformoterol (BROVANA) 15 MCG/2ML NEBU Inhale into the lungs 2 (two) times daily. 10/24/21   [provider]  azelastine (ASTELIN) 0.1 % nasal spray Place 1 spray into both nostrils 2 (two) times daily. Use in each nostril as directed    [provider]  budesonide (PULMICORT) 0.25 MG/2ML nebulizer solution Inhale 0.25 mg into the lungs 2 (two) times daily. 09/06/21 09/06/22  [provider]  bumetanide (BUMEX) 1 MG tablet Take 1 mg by mouth daily.    [provider]  calcium citrate-vitamin D (CITRACAL+D) 315-200 MG-UNIT tablet Take 1 tablet by mouth 2 (two) times daily.    [provider]  dipyridamole (PERSANTINE) 50 MG tablet Take 50 mg by mouth daily.    [provider]  hydrocortisone (CORTEF) 10 MG tablet Take by mouth. 15 mg in morning, 5 mg at bedtime 04/27/21   [provider]  ipratropium-albuterol (DUONEB) 0.5-2.5 (3) MG/3ML SOLN Take 3 mLs by nebulization every 4 (four) hours as needed. 06/30/17   Copland, Gay Filler, MD  losartan (COZAAR) 25 MG tablet  Take 25 mg by mouth daily.    [provider]  magnesium oxide (MAG-OX) 400 MG tablet Take 400 mg by mouth 2 (two) times daily.    [provider]  pantoprazole (PROTONIX) 40 MG tablet Take 40 mg by mouth daily.    [provider]  revefenacin (YUPELRI) 175 MCG/3ML nebulizer solution Take 3 mLs (175 mcg total) by nebulization daily. 11/20/21   Freddi Starr, MD  revefenacin (YUPELRI) 175 MCG/3ML nebulizer solution Take 3 mLs (175 mcg total) by nebulization daily. 11/22/21   Freddi Starr, MD  tacrolimus (PROGRAF) 1 MG capsule Take 1 mg by mouth 2 (two) times a day. TAKE 2 TABLETS ('3MG'$  TOTAL) IN THE MORNING AND THREE TABLETS ('3MG'$  TOTAL) IN THE EVENING.    [provider]  UNABLE TO FIND 25 mg. Med Name: Verapamil 25 mg daily    [provider]  verapamil (CALAN-SR) 120 MG CR tablet Take 120 mg by mouth daily. 10/25/21   [provider]      Allergies    Aspirin, Peanut-containing drug products, Gabapentin, Statins, and Azithromycin    Review of Systems   Review of Systems  Musculoskeletal:  Positive for arthralgias and joint swelling.  Neurological:  Negative for weakness and numbness.   Physical Exam Updated Vital Signs BP (!) 165/80    Pulse 79    Temp 98.1 F (36.7 C) (Oral)    Resp 16  Ht '5\' 2"'$  (1.575 m)    Wt 70.3 kg    SpO2 98%    BMI 28.35 kg/m  Physical Exam Vitals and nursing note reviewed.  Constitutional:      General: She is not in acute distress.    Appearance: She is not ill-appearing.  HENT:     Head: Normocephalic.  Eyes:     Pupils: Pupils are equal, round, and reactive to light.  Cardiovascular:     Rate and Rhythm: Normal rate and regular rhythm.     Pulses: Normal pulses.     Heart sounds: Normal heart sounds. No murmur heard.   No friction rub. No gallop.  Pulmonary:     Effort: Pulmonary effort is normal.     Breath sounds: Normal breath sounds.  Abdominal:     General: Abdomen is flat. There  is no distension.     Palpations: Abdomen is soft.     Tenderness: There is no abdominal tenderness. There is no guarding or rebound.  Musculoskeletal:        General: Normal range of motion.     Cervical back: Neck supple.     Comments: Splint cut off prior to my initial evaluation. Edema to left 2-4 metacarpals. Radial pulse intact. Soft compartments.   Skin:    General: Skin is warm and dry.  Neurological:     General: No focal deficit present.     Mental Status: She is alert.  Psychiatric:        Mood and Affect: Mood normal.        Behavior: Behavior normal.    ED Results / Procedures / Treatments   Labs (all labs ordered are listed, but only abnormal results are displayed) Labs Reviewed - No data to display  EKG None  Radiology CT Head Wo Contrast  Result Date: 12/05/2021 CLINICAL DATA:  Head trauma, moderate-severe EXAM: CT HEAD WITHOUT CONTRAST TECHNIQUE: Contiguous axial images were obtained from the base of the skull through the vertex without intravenous contrast. RADIATION DOSE REDUCTION: This exam was performed according to the departmental dose-optimization program which includes automated exposure control, adjustment of the mA and/or kV according to patient size and/or use of iterative reconstruction technique. COMPARISON:  None. FINDINGS: Brain: There is no acute intracranial hemorrhage, mass effect, or edema. Gray-white differentiation is preserved. There is no extra-axial fluid collection. Ventricles and sulci are within normal limits in size and configuration. Vascular: There is mild atherosclerotic calcification at the skull base. Skull: Calvarium is unremarkable. Sinuses/Orbits: No acute finding. Other: None. IMPRESSION: No evidence of acute intracranial injury. Electronically Signed   By: Macy Mis M.D.   On: 12/05/2021 11:57   CT Thoracic Spine Wo Contrast  Result Date: 12/05/2021 CLINICAL DATA:  Back trauma, no prior imaging (Age >= 16y); fall down steps  EXAM: CT THORACIC SPINE WITHOUT CONTRAST TECHNIQUE: Multidetector CT images of the thoracic were obtained using the standard protocol without intravenous contrast. RADIATION DOSE REDUCTION: This exam was performed according to the departmental dose-optimization program which includes automated exposure control, adjustment of the mA and/or kV according to patient size and/or use of iterative reconstruction technique. COMPARISON:  Correlation made with prior chest CT FINDINGS: Alignment: Preserved. Vertebrae: Thoracic vertebral body heights are maintained. No acute fracture. Paraspinal and other soft tissues: No paraspinal hematoma. Scattered pulmonary nodules are probably related to history of sarcoidosis. 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 No high-grade osseous  encroachment on the spinal canal or neural foramina. IMPRESSION: No acute fracture of the thoracic spine. Electronically Signed   By: Macy Mis M.D.   On: 12/05/2021 11:55   DG Hand Complete Left  Result Date: 12/05/2021 CLINICAL DATA:  Left fifth metacarpal tenderness after fall. EXAM: LEFT HAND - COMPLETE 3 VIEW COMPARISON:  None. FINDINGS: There is no evidence of fracture or dislocation. Generalized osteopenia. Interphalangeal osteoarthritis especially affecting the distal interphalangeal joints with some erosive changes at the index and middle finger. IMPRESSION: No acute finding. Electronically Signed   By: Jorje Guild M.D.   On: 12/05/2021 11:26    Procedures Procedures    Medications Ordered in ED Medications - No data to display  ED Course/ Medical Decision Making/ A&P                           Medical Decision Making Amount and/or Complexity of Data Reviewed Independent Historian: spouse    Details: Husband at bedside provided history Radiology: independent interpretation performed. Decision-making details  documented in ED Course.    Details: reviewed images from yesterday   61 year old female presents to the ED due to left hand pain and edema.  Patient seen in the ED yesterday after mechanical fall down 10 stairs where an x-ray was performed of her left hand which was negative for any bony fractures however, patient placed in an ulnar gutter splint due to possibility of fracture not visualized on x-ray.  Patient notes she already has an appointment with PCP in 1 week for repeat imaging.  Patient notes she felt like her splint was too tight causing significant edema and pain.  Prior to my initial evaluation, splint mostly removed and patient notes improvement in pain.  I removed the rest of the splint during my initial evaluation.  Radial pulse intact.  Soft compartments.  Low suspicion for compartment syndrome.  Edema to left 2nd-4th metacarpals.  Full range of motion of all fingers. No color change to fingers or arm. New splint placed. I evaluated patient after splint placement which appears to be secure with slightly more room for possible further edema. Suspect pain related to splint being slightly too tight after post-traumatic edema. Advised patient to take OTC tylenol as needed for pain. Report to PCP for further evaluation at her scheduled appointment. Strict ED precautions discussed with patient. Patient states understanding and agrees to plan. Patient discharged home in no acute distress and stable vitals         Final Clinical Impression(s) / ED Diagnoses Final diagnoses:  Left hand pain    Rx / DC Orders ED Discharge Orders     None         Suzy Bouchard, PA-C 12/06/21 1038    Lennice Sites, DO 12/06/21 1106

## 2021-12-11 NOTE — Progress Notes (Signed)
Therapist, music at [facility] ?[address], [address] ?[place], [place] [address] ?336 [phone] ?Fax [phone] ? ?Date:  12/12/2021  ? ?Name:  Alicia [name]   DOB:  [birth year]   MRN:  [id number] ? ?PCP:  Darreld Mclean, MD  ? ? ?Chief Complaint: No chief complaint on file. ? ? ?History of Present Illness: ? ?Alicia [name] is a 61 y.o. very pleasant female patient who presents with the following: ? ?Patient seen today to follow-up on injury from a recent fall ?She was seen in the ER on March 8 and again on March 9 ?She slipped and fell down about 10 carpeted steps, landing on her back.  She hit her head, and injured her left hand and left second toe ?At her original ER visit she had x-rays, CT of her head and thoracic spine ?CT scans looked okay, concern for left fifth MCP fracture-she was placed in a protective ulnar gutter splint ?She came back the next day because her splint was too tight-a new splint was provided.  However, she is currently using just an Ace bandage which she does provide adequate support ?Her head and neck are ok- no HA  ?Can repeat x-rays today-films on March 8 read as negative ? 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  They plan to follow-up in 6 months ?Alicia [name] is stable from a liver perspective. She lacks clinical and biochemical features that would be concerning for a decline in liver allograft function. She is tolerating her current immunosuppression. Today's lab testing confirms stable liver allograft function. She may continue tacrolimus at current dosing. Prednisone dosing is managed by her endocrinologist due to Alicia. [name]'s history of adrenal insufficiency. From a liver transplant standpoint, it is acceptable for her to undergo prednisone dose reduction under the guidance of Endocrinology. She may repeat lab testing in 3 months ? ?Patient Active Problem List   ? Diagnosis Date Noted  ? Osteoporosis without current pathological fracture 05/25/2018  ? Status post liver transplant ([place]) 12/25/2017  ? Syncope 07/17/2017  ? Other cirrhosis of liver ([place]) 05/10/2017  ? Immunocompromised patient ([place]) 05/10/2017  ? Mucous cyst of digit of left hand 04/11/2017  ? Cellulitis, abdominal wall 03/01/2017  ? Normocytic anemia 03/01/2017  ? Hypokalemia 03/01/2017  ? CKD (chronic kidney disease), stage III ([place]) 03/01/2017  ? Abdominal wall cellulitis 03/01/2017  ? Adrenal insufficiency ([place]) 03/01/2017  ? Hyperammonemia ([place])   ? Cellulitis 06/28/2016  ? Thrombocytopenia ([place]) 06/28/2016  ? AKI (acute kidney injury) ([place]) 06/28/2016  ? Multiple pulmonary nodules determined by computed tomography of lung   ? Chronic cough 09/07/2015  ? Hypertension, portal ([place]) 09/04/2011  ? Asthma with chronic obstructive pulmonary disease (COPD) ([place]) 09/04/2011  ? 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 GERD (gastroesophageal reflux disease) 07/04/2011  ? Sarcoidosis 07/01/2011  ? Asthma, severe persistent, with upper airway instability 06/13/2011  ? Congenital hepatic fibrosis 07/03/2000  ? ? ?Past Medical History:  ?Diagnosis Date  ? Allergy   ? Anemia   ? Asthma   ? Cellulitis   ? Cirrhosis ([place])   ? GERD (gastroesophageal reflux disease)   ? Hepatic fibrosis   ? congenital  ? History of UTI   ? chronic as a child  ? Sarcoidosis   ? Spleen enlarged   ? ? ?Past Surgical History:  ?Procedure Laterality Date  ? COLONOSCOPY    ?  CYST EXCISION Left 05/01/2017  ? Procedure: LEFT INDEX FINGER CYST EXCISION;  Surgeon: Leandrew Koyanagi, MD;  Location: [facility];  Service: Orthopedics;  Laterality: Left;  ? ESOPHAGOGASTRODUODENOSCOPY    ? LIVER TRANSPLANT    ? SPINAL FUSION  12/2007  ? Double spinal Fusion  ? VIDEO BRONCHOSCOPY Bilateral 12/08/2015  ? Procedure: VIDEO BRONCHOSCOPY WITH FLUORO;  Surgeon: Rigoberto Noel, MD;  Location: [facility];  Service: Cardiopulmonary;  Laterality: Bilateral;  ? ? ?Social History   ? ?Tobacco Use  ? Smoking status: Never  ? Smokeless tobacco: Never  ?Substance Use Topics  ? Alcohol use: No  ? Drug use: No  ? ? ?Family History  ?Problem Relation Age of Onset  ? Arthritis Mother   ? Heart disease Father   ?     Triple bypass  ? Migraines Father   ? Heart attack Paternal Grandmother   ? Stroke Paternal Grandfather   ? Liver disease Sister   ?     Juvenile cysitc liver disease  ? Asthma Sister   ? ? ?Allergies  ?Allergen Reactions  ? Aspirin Other (See Comments)  ?  Causes Asthma  ? Peanut-Containing Drug Products Shortness Of Breath  ?  Asthma, eye swelling, itchy eyes, can't catch her breath  ? Gabapentin Other (See Comments)  ?  Blisters in mouth, nausea   ? Statins   ?  Asthma, eye swelling, itchy eyes, can't catch her breath  ? Azithromycin Nausea And Vomiting  ? ? ?Medication list has been reviewed and updated. ? ?Current Outpatient Medications on File Prior to Visit  ?Medication Sig Dispense Refill  ? arformoterol (BROVANA) 15 MCG/2ML NEBU Inhale into the lungs 2 (two) times daily.    ? azelastine (ASTELIN) 0.1 % nasal spray Place 1 spray into both nostrils 2 (two) times daily. Use in each nostril as directed    ? budesonide (PULMICORT) 0.25 MG/2ML nebulizer solution Inhale 0.25 mg into the lungs 2 (two) times daily.    ? bumetanide (BUMEX) 1 MG tablet Take 1 mg by mouth daily.    ? calcium citrate-vitamin D (CITRACAL+D) 315-200 MG-UNIT tablet Take 1 tablet by mouth 2 (two) times daily.    ? dipyridamole (PERSANTINE) 50 MG tablet Take 50 mg by mouth daily.    ? hydrocortisone (CORTEF) 10 MG tablet Take by mouth. 15 mg in morning, 5 mg at bedtime    ? ipratropium-albuterol (DUONEB) 0.5-2.5 (3) MG/3ML SOLN Take 3 mLs by nebulization every 4 (four) hours as needed. 360 mL 0  ? losartan (COZAAR) 25 MG tablet Take 25 mg by mouth daily.    ? magnesium oxide (MAG-OX) 400 MG tablet Take 400 mg by mouth 2 (two) times daily.    ? pantoprazole (PROTONIX) 40 MG tablet Take 40 mg by mouth daily.    ?  revefenacin (YUPELRI) 175 MCG/3ML nebulizer solution Take 3 mLs (175 mcg total) by nebulization daily. 90 mL 6  ? revefenacin (YUPELRI) 175 MCG/3ML nebulizer solution Take 3 mLs (175 mcg total) by nebulization daily. 21 mL 0  ? tacrolimus (PROGRAF) 1 MG capsule Take 1 mg by mouth 2 (two) times a day. TAKE 2 TABLETS ('3MG'$  TOTAL) IN THE MORNING AND THREE TABLETS ('3MG'$  TOTAL) IN THE EVENING.    ? UNABLE TO FIND 25 mg. Med Name: Verapamil 25 mg daily    ? verapamil (CALAN-SR) 120 MG CR tablet Take 120 mg by mouth daily.    ? ?No current facility-administered medications on file prior  to visit.  ? ? ?Review of Systems: ? ?As per HPI- otherwise negative. ? ? ?Physical Examination: ?Vitals:  ? 12/12/21 0914  ?BP: 136/88  ?Pulse: 84  ?Resp: 20  ?SpO2: 97%  ? ?Vitals:  ? 12/12/21 0914  ?Weight: 162 lb (73.5 kg)  ?Height: '5\' 2"'$  (1.575 m)  ? ?Body mass index is 29.63 kg/m?. ?Ideal Body Weight: Weight in (lb) to have BMI = 25: 136.4 ?GEN: no acute distress.  Overweight, looks well and her normal self ?HEENT: Atraumatic, Normocephalic.  ?Ears and Nose: No external deformity. ?CV: RRR, No M/G/R. No JVD. No thrill. No extra heart sounds. ?PULM: CTA B, no wheezes, crackles, rhonchi. No retractions. No resp. distress. No accessory muscle use. ?ABD: S, NT, ND, +BS. No rebound. No HSM. ?EXTR: No c/c/e ?PSYCH: Normally interactive. Conversant.  ?Left hand; pt has tenderness over the left fifth MCP joint.  She has pain when she tries to make a fist. ?Hand is neurovascularly intact ?No swelling or bruising is noted ? ?Assessment and Plan: ?Fall in home, subsequent encounter - Plan: DG Hand Complete Left, CANCELED: DG Hand Complete Left ? ?Current chronic use of systemic steroids - Plan: DG Bone Density ? ?Patient seen today with concern of a possible left fifth metacarpal fracture. ?X-rays in the ER were negative, but exam suspicious.  Her pain is improving and her hand is still sore. ?We will obtain repeat films for today ? ? ?She has no  history of osteoporosis, has been on chronic systemic steroids for history of liver transplant.  Her endocrinologist has noted she may be able to stop Fosamax if her bone density shows improvement.  Neck sca

## 2021-12-11 NOTE — Patient Instructions (Addendum)
Good to see you again today- I will be in touch with your x-rays asap ?If there is a fracture we will get you in to see ortho- Dr Erlinda Hong hopefully- asap  ?Please schedule your bone density for the fall as well  ?

## 2021-12-12 ENCOUNTER — Encounter: Payer: Self-pay | Admitting: Family Medicine

## 2021-12-12 ENCOUNTER — Other Ambulatory Visit: Payer: Self-pay

## 2021-12-12 ENCOUNTER — Ambulatory Visit (HOSPITAL_BASED_OUTPATIENT_CLINIC_OR_DEPARTMENT_OTHER)
Admission: RE | Admit: 2021-12-12 | Discharge: 2021-12-12 | Disposition: A | Discharge status: Home / Self Care | Payer: Medicare Other | Source: Ambulatory Visit | Attending: Family Medicine | Admitting: Family Medicine

## 2021-12-12 ENCOUNTER — Ambulatory Visit (INDEPENDENT_AMBULATORY_CARE_PROVIDER_SITE_OTHER): Payer: Medicare Other | Admitting: Family Medicine

## 2021-12-12 VITALS — BP 136/88 | HR 84 | Resp 20 | Ht 62.0 in | Wt 162.0 lb

## 2021-12-12 DIAGNOSIS — S6992XA Unspecified injury of left wrist, hand and finger(s), initial encounter: Secondary | ICD-10-CM | POA: Diagnosis present

## 2021-12-12 DIAGNOSIS — M79642 Pain in left hand: Secondary | ICD-10-CM

## 2021-12-12 DIAGNOSIS — Y92009 Unspecified place in unspecified non-institutional (private) residence as the place of occurrence of the external cause: Secondary | ICD-10-CM

## 2021-12-12 DIAGNOSIS — Z7952 Long term (current) use of systemic steroids: Secondary | ICD-10-CM | POA: Diagnosis not present

## 2021-12-12 DIAGNOSIS — W19XXXD Unspecified fall, subsequent encounter: Secondary | ICD-10-CM | POA: Insufficient documentation

## 2021-12-12 DIAGNOSIS — M19042 Primary osteoarthritis, left hand: Secondary | ICD-10-CM | POA: Diagnosis not present

## 2021-12-14 MED ORDER — YUPELRI 175 MCG/3ML IN SOLN: 175.0000 ug | Freq: Every day | RESPIRATORY_TRACT | 0 refills | Status: DC

## 2021-12-14 NOTE — Telephone Encounter (Signed)
I called [facility] twice at 931am and 1237pm and could not speak with anyone. The phone kept ringing without anyone answering. I have left 2 samples at the front desk for her. I will call [facility] again this afternoon.  ?

## 2021-12-17 NOTE — Telephone Encounter (Signed)
Dr. Erin Fulling, ?Please see patient message regarding [name] cost and advise.  Thank you. ?

## 2021-12-27 ENCOUNTER — Other Ambulatory Visit (HOSPITAL_COMMUNITY): Payer: Self-pay

## 2021-12-27 NOTE — Telephone Encounter (Signed)
There is no PAP for Yupelri. Patient can consider enrolling into supplemental Medicare plan (Plan G/F, etc) but unfortunately there is no program to help with additional cost assistance since no grants are open ? ?Routing back to triage ? ?[name], PharmD, MPH, BCPS ?Clinical Pharmacist (Rheumatology and Pulmonology) ? ?

## 2021-12-27 NOTE — Telephone Encounter (Signed)
Because [name] is billed through Medicare Part B as DME, there does not seem to be a patient assistance program in place. Patients who have a supplemental Plan F/G are able to get additional cost support past their Part B. ? ?Patient does not qualify for copay assistance since she has Medicare. No current asthma/COPD grants open. ? ?Routing to prior auth team because they may have other advice to offer triage team ? ?[name], PharmD, MPH, BCPS ?Clinical Pharmacist (Rheumatology and Pulmonology) ?

## 2021-12-27 NOTE — Telephone Encounter (Signed)
PA team, are you able to do tier exceptions? The pt is requesting. Thanks.  ?

## 2021-12-27 NOTE — Telephone Encounter (Signed)
Good morning, ?Is there any assistance for patients that need Yupelri and cannot afford it?  Please advise.  Please route back to triage.  Thank you. ?

## 2021-12-28 ENCOUNTER — Other Ambulatory Visit (HOSPITAL_COMMUNITY): Payer: Self-pay

## 2022-01-01 NOTE — Telephone Encounter (Signed)
Mandi, do you know if this would help? The yupelri reps. Thanks.  ?

## 2022-01-21 DIAGNOSIS — M810 Age-related osteoporosis without current pathological fracture: Secondary | ICD-10-CM | POA: Diagnosis not present

## 2022-01-21 DIAGNOSIS — E2740 Unspecified adrenocortical insufficiency: Secondary | ICD-10-CM | POA: Diagnosis not present

## 2022-02-01 ENCOUNTER — Encounter: Payer: Self-pay | Admitting: Pulmonary Disease

## 2022-02-01 DIAGNOSIS — J4550 Severe persistent asthma, uncomplicated: Secondary | ICD-10-CM

## 2022-02-11 NOTE — Telephone Encounter (Signed)
April, please advise if you have spoken to the drug rep. Thanks.  ?

## 2022-02-12 NOTE — Telephone Encounter (Signed)
I do not have this information. Mandi, may have the contact number for the rep.  ?

## 2022-02-13 ENCOUNTER — Encounter: Payer: Self-pay | Admitting: Family Medicine

## 2022-02-13 DIAGNOSIS — Z944 Liver transplant status: Secondary | ICD-10-CM

## 2022-02-14 MED ORDER — BUDESONIDE 0.25 MG/2ML IN SUSP: 0.2500 mg | Freq: Two times a day (BID) | RESPIRATORY_TRACT | 4 refills | Status: DC

## 2022-02-14 MED ORDER — IPRATROPIUM-ALBUTEROL 0.5-2.5 (3) MG/3ML IN SOLN: 3.0000 mL | Freq: Four times a day (QID) | RESPIRATORY_TRACT | 4 refills | Status: DC | PRN

## 2022-02-19 NOTE — Addendum Note (Signed)
Addended by: Kelle Darting A on: 02/19/2022 01:58 PM   Modules accepted: Orders

## 2022-02-21 ENCOUNTER — Other Ambulatory Visit (INDEPENDENT_AMBULATORY_CARE_PROVIDER_SITE_OTHER): Payer: Medicare Other

## 2022-02-21 ENCOUNTER — Other Ambulatory Visit (HOSPITAL_BASED_OUTPATIENT_CLINIC_OR_DEPARTMENT_OTHER): Payer: Self-pay | Admitting: Family Medicine

## 2022-02-21 DIAGNOSIS — Z1231 Encounter for screening mammogram for malignant neoplasm of breast: Secondary | ICD-10-CM

## 2022-02-21 DIAGNOSIS — Z944 Liver transplant status: Secondary | ICD-10-CM | POA: Diagnosis not present

## 2022-02-21 LAB — CBC WITH DIFFERENTIAL/PLATELET
Basophils Absolute: 0 10*3/uL (ref 0.0–0.1)
Basophils Relative: 0.8 % (ref 0.0–3.0)
Eosinophils Absolute: 0.1 10*3/uL (ref 0.0–0.7)
Eosinophils Relative: 2.3 % (ref 0.0–5.0)
HCT: 35.2 % — ABNORMAL LOW (ref 36.0–46.0)
Hemoglobin: 12.2 g/dL (ref 12.0–15.0)
Lymphocytes Relative: 40.9 % (ref 12.0–46.0)
Lymphs Abs: 1.4 10*3/uL (ref 0.7–4.0)
MCHC: 34.6 g/dL (ref 30.0–36.0)
MCV: 89.1 fl (ref 78.0–100.0)
Monocytes Absolute: 0.3 10*3/uL (ref 0.1–1.0)
Monocytes Relative: 9.6 % (ref 3.0–12.0)
Neutro Abs: 1.6 10*3/uL (ref 1.4–7.7)
Neutrophils Relative %: 46.4 % (ref 43.0–77.0)
Platelets: 170 10*3/uL (ref 150.0–400.0)
RBC: 3.94 Mil/uL (ref 3.87–5.11)
RDW: 13.1 % (ref 11.5–15.5)
WBC: 3.4 10*3/uL — ABNORMAL LOW (ref 4.0–10.5)

## 2022-02-21 LAB — COMPREHENSIVE METABOLIC PANEL
ALT: 8 U/L (ref 0–35)
AST: 11 U/L (ref 0–37)
Albumin: 4.3 g/dL (ref 3.5–5.2)
Alkaline Phosphatase: 47 U/L (ref 39–117)
BUN: 23 mg/dL (ref 6–23)
CO2: 27 mEq/L (ref 19–32)
Calcium: 9.2 mg/dL (ref 8.4–10.5)
Chloride: 107 mEq/L (ref 96–112)
Creatinine, Ser: 1.87 mg/dL — ABNORMAL HIGH (ref 0.40–1.20)
GFR: 28.34 mL/min — ABNORMAL LOW (ref >60.00)
Glucose, Bld: 103 mg/dL — ABNORMAL HIGH (ref 70–99)
Potassium: 5.1 mEq/L (ref 3.5–5.1)
Sodium: 141 mEq/L (ref 135–145)
Total Bilirubin: 0.5 mg/dL (ref 0.2–1.2)
Total Protein: 6.3 g/dL (ref 6.0–8.3)

## 2022-02-21 LAB — CORTISOL: Cortisol, Plasma: 11 ug/dL

## 2022-02-22 ENCOUNTER — Encounter: Payer: Self-pay | Admitting: Orthopedic Surgery

## 2022-02-22 ENCOUNTER — Ambulatory Visit: Payer: Self-pay

## 2022-02-22 ENCOUNTER — Ambulatory Visit: Payer: Medicare Other | Admitting: Orthopedic Surgery

## 2022-02-22 DIAGNOSIS — M79642 Pain in left hand: Secondary | ICD-10-CM | POA: Diagnosis not present

## 2022-02-22 NOTE — Progress Notes (Signed)
Office Visit Note   Patient: Alicia [name]           Date of Birth: 20-Oct-1960           MRN: [id number] Visit Date: 02/22/2022              Requested by: Darreld Mclean, MD [address] [place],  [place] [address] PCP: Darreld Mclean, MD   Assessment & Plan: Visit Diagnoses:  1. Pain in left hand     Plan: Discussed with patient that on the oblique x-ray taken today, there may be a suggestion of a recent fifth metacarpal neck fracture with a periosteal reaction.  This does correspond to the area of most of her pain which is the volar aspect of the fifth MCP joint.  We discussed that its been 8 weeks or so since her injury so she does not need any rigid immobilization.  We will give her a buddy strap to provide some stability.  She can see me again as needed if she is still symptomatic after a few more weeks.  Follow-Up Instructions: No follow-ups on file.   Orders:  Orders Placed This Encounter  Procedures   XR Hand Complete Left   No orders of the defined types were placed in this encounter.     Procedures: No procedures performed   Clinical Data: No additional findings.   Subjective: Chief Complaint  Patient presents with   Left Hand - New Patient (Initial Visit)    This is a 61 year old right-hand-dominant female who presents with left hand pain at the fifth MCP joint.  It all started after ground-level fall in March of this year in which she fell down 14 stairs.  She has since had pain in the small finger MCP joint.  It is mostly at the volar aspect.  Pain is worse with prolonged activity but has been improving gradually since the injury.  She denies pain elsewhere in her hand.  She denies any previous injuries to this finger.   Review of Systems   Objective: Vital Signs: There were no vitals taken for this visit.  Physical Exam Constitutional:      Appearance: Normal appearance.  Cardiovascular:     Rate and Rhythm: Normal rate.      Pulses: Normal pulses.  Pulmonary:     Effort: Pulmonary effort is normal.  Skin:    General: Skin is warm and dry.     Capillary Refill: Capillary refill takes less than 2 seconds.  Neurological:     Mental Status: She is alert.    Left Hand Exam   Tenderness  Left hand tenderness location: TTP at small finger MCP joint; mostly on volar side.   Range of Motion  The patient has normal left wrist ROM.  Other  Erythema: absent Sensation: normal Pulse: present  Comments:  Full ROM of small finger with mild pain on terminal flexion.  No apparent radial or ulnar collateral ligament instability at the small finger MCPJ.       Specialty Comments:  No specialty comments available.  Imaging: No results found.   PMFS History: Patient Active Problem List   Diagnosis Date Noted   Osteoporosis without current pathological fracture 05/25/2018   Status post liver transplant ([place]) 12/25/2017   Syncope 07/17/2017   Other cirrhosis of liver ([place]) 05/10/2017   Immunocompromised patient ([place]) 05/10/2017   Mucous cyst of digit of left hand 04/11/2017   Cellulitis, abdominal wall 03/01/2017  Normocytic anemia 03/01/2017   Hypokalemia 03/01/2017   CKD (chronic kidney disease), stage III ([place]) 03/01/2017   Abdominal wall cellulitis 03/01/2017   Adrenal insufficiency ([place]) 03/01/2017   Hyperammonemia ([place])    Cellulitis 06/28/2016   Thrombocytopenia ([place]) 06/28/2016   AKI (acute kidney injury) ([place]) 06/28/2016   Multiple pulmonary nodules determined by computed tomography of lung    Chronic cough 09/07/2015   Hypertension, portal ([place]) 09/04/2011   Asthma with chronic obstructive pulmonary disease (COPD) ([place]) 09/04/2011   Vocal cord nodules 07/04/2011   GERD (gastroesophageal reflux disease) 07/04/2011   Sarcoidosis 07/01/2011   Asthma, severe persistent, with upper airway instability 06/13/2011   Congenital hepatic fibrosis 07/03/2000   Past Medical History:  Diagnosis  Date   Allergy    Anemia    Asthma    Cellulitis    Cirrhosis (HCC)    GERD (gastroesophageal reflux disease)    Hepatic fibrosis    congenital   History of UTI    chronic as a child   Sarcoidosis    Spleen enlarged     Family History  Problem Relation Age of Onset   Arthritis Mother    Heart disease Father        Triple bypass   Migraines Father    Heart attack Paternal Grandmother    Stroke Paternal Grandfather    Liver disease Sister        Juvenile cysitc liver disease   Asthma Sister     Past Surgical History:  Procedure Laterality Date   COLONOSCOPY     CYST EXCISION Left 05/01/2017   Procedure: LEFT INDEX FINGER CYST EXCISION;  Surgeon: Leandrew Koyanagi, MD;  Location: [facility];  Service: Orthopedics;  Laterality: Left;   ESOPHAGOGASTRODUODENOSCOPY     LIVER TRANSPLANT     SPINAL FUSION  12/2007   Double spinal Fusion   VIDEO BRONCHOSCOPY Bilateral 12/08/2015   Procedure: VIDEO BRONCHOSCOPY WITH FLUORO;  Surgeon: Rigoberto Noel, MD;  Location: [facility];  Service: Cardiopulmonary;  Laterality: Bilateral;   Social History   Occupational History   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

## 2022-02-26 LAB — TACROLIMUS,HIGHLY SENSITIVE,LC/MS/MS: Tacrolimus Lvl: 5.1 mcg/L

## 2022-02-27 ENCOUNTER — Encounter (HOSPITAL_BASED_OUTPATIENT_CLINIC_OR_DEPARTMENT_OTHER): Payer: Self-pay

## 2022-02-27 ENCOUNTER — Ambulatory Visit (HOSPITAL_BASED_OUTPATIENT_CLINIC_OR_DEPARTMENT_OTHER)
Admission: RE | Admit: 2022-02-27 | Discharge: 2022-02-27 | Disposition: A | Discharge status: Home / Self Care | Payer: Medicare Other | Source: Ambulatory Visit | Attending: Family Medicine | Admitting: Family Medicine

## 2022-02-27 DIAGNOSIS — Z1231 Encounter for screening mammogram for malignant neoplasm of breast: Secondary | ICD-10-CM | POA: Diagnosis not present

## 2022-02-28 LAB — ACTH: C206 ACTH: 50 pg/mL (ref 6–50)

## 2022-03-06 ENCOUNTER — Ambulatory Visit (INDEPENDENT_AMBULATORY_CARE_PROVIDER_SITE_OTHER): Payer: Medicare Other

## 2022-03-06 ENCOUNTER — Ambulatory Visit: Payer: Medicare Other | Admitting: Orthopaedic Surgery

## 2022-03-06 DIAGNOSIS — M25512 Pain in left shoulder: Secondary | ICD-10-CM

## 2022-03-06 NOTE — Progress Notes (Signed)
Office Visit Note   Patient: Alicia [name]           Date of Birth: [birth year]           MRN: [id number] Visit Date: 03/06/2022              Requested by: Darreld Mclean, MD [address] [place],  [place] [address] PCP: Darreld Mclean, MD   Assessment & Plan: Visit Diagnoses:  1. Left shoulder pain, unspecified chronicity     Plan: Impression is chronic left shoulder pain concerning for labral and possible biceps pathology.  I would like to make referral to Dr. Ernestina Patches for left shoulder glenohumeral cortisone injection.  She will follow-up with [place] as needed.  Call with concerns or questions in the meantime.  Follow-Up Instructions: Return if symptoms worsen or fail to improve.   Orders:  Orders Placed This Encounter  Procedures   XR Shoulder Left   Ambulatory referral to Physical Medicine Rehab   No orders of the defined types were placed in this encounter.     Procedures: No procedures performed   Clinical Data: No additional findings.   Subjective: Chief Complaint  Patient presents with   Left Shoulder - Pain    HPI patient is a pleasant 61 year old female who comes in today with left shoulder pain.  This began about 3 months ago when she fell down a set of stairs.  She thinks she grabbed the handrail with her left hand which possibly pulled her shoulder back.  She has had pain to the entire shoulder and deep within the left scapular region since.  She has increased pain with shoulder abduction, external rotation as well as when she is doing certain poses in senior yoga.  She is status post liver transplant and is unable to take NSAIDs.  She is only able to take minimal Tylenol.  History of cervical spine fusion by Dr. Lorin Mercy years ago.  Review of Systems as detailed in HPI.  All others reviewed and are negative.   Objective: Vital Signs: There were no vitals taken for this visit.  Physical Exam well-developed well-nourished female no  acute distress.  Alert and oriented x3.  Ortho Exam left shoulder exam reveals full forward flexion.  She has near full abduction but with pain.  Internal rotation to her back pocket.  Negative empty can test.  Positive speeds and O'Brien's test.  She has slight pain with resisted external rotation.  She is neurovascular intact distally.  Specialty Comments:  No specialty comments available.  Imaging: XR Shoulder Left  Result Date: 03/06/2022 No acute or structural abnormalities    PMFS History: Patient Active Problem List   Diagnosis Date Noted   Osteoporosis without current pathological fracture 05/25/2018   Status post liver transplant ([place]) 12/25/2017   Syncope 07/17/2017   Other cirrhosis of liver ([place]) 05/10/2017   Immunocompromised patient ([place]) 05/10/2017   Mucous cyst of digit of left hand 04/11/2017   Cellulitis, abdominal wall 03/01/2017   Normocytic anemia 03/01/2017   Hypokalemia 03/01/2017   CKD (chronic kidney disease), stage III ([place]) 03/01/2017   Abdominal wall cellulitis 03/01/2017   Adrenal insufficiency ([place]) 03/01/2017   Hyperammonemia ([place])    Cellulitis 06/28/2016   Thrombocytopenia ([place]) 06/28/2016   AKI (acute kidney injury) ([place]) 06/28/2016   Multiple pulmonary nodules determined by computed tomography of lung    Chronic cough 09/07/2015   Hypertension, portal ([place]) 09/04/2011   Asthma with chronic obstructive  pulmonary disease (COPD) ([place]) 09/04/2011   Vocal cord nodules 07/04/2011   GERD (gastroesophageal reflux disease) 07/04/2011   Sarcoidosis 07/01/2011   Asthma, severe persistent, with upper airway instability 06/13/2011   Congenital hepatic fibrosis 07/03/2000   Past Medical History:  Diagnosis Date   Allergy    Anemia    Asthma    Cellulitis    Cirrhosis (HCC)    GERD (gastroesophageal reflux disease)    Hepatic fibrosis    congenital   History of UTI    chronic as a child   Sarcoidosis    Spleen enlarged     Family History   Problem Relation Age of Onset   Arthritis Mother    Heart disease Father        Triple bypass   Migraines Father    Heart attack Paternal Grandmother    Stroke Paternal Grandfather    Liver disease Sister        Juvenile cysitc liver disease   Asthma Sister     Past Surgical History:  Procedure Laterality Date   COLONOSCOPY     CYST EXCISION Left 05/01/2017   Procedure: LEFT INDEX FINGER CYST EXCISION;  Surgeon: Leandrew Koyanagi, MD;  Location: [facility];  Service: Orthopedics;  Laterality: Left;   ESOPHAGOGASTRODUODENOSCOPY     LIVER TRANSPLANT     SPINAL FUSION  12/2007   Double spinal Fusion   VIDEO BRONCHOSCOPY Bilateral 12/08/2015   Procedure: VIDEO BRONCHOSCOPY WITH FLUORO;  Surgeon: Rigoberto Noel, MD;  Location: [facility];  Service: Cardiopulmonary;  Laterality: Bilateral;   Social History   Occupational History   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

## 2022-03-13 ENCOUNTER — Ambulatory Visit: Payer: Medicare Other | Admitting: Physical Medicine and Rehabilitation

## 2022-03-13 ENCOUNTER — Ambulatory Visit: Payer: Self-pay

## 2022-03-13 ENCOUNTER — Encounter: Payer: Self-pay | Admitting: Physical Medicine and Rehabilitation

## 2022-03-13 DIAGNOSIS — G8929 Other chronic pain: Secondary | ICD-10-CM | POA: Diagnosis not present

## 2022-03-13 DIAGNOSIS — M25512 Pain in left shoulder: Secondary | ICD-10-CM

## 2022-03-13 MED ORDER — TRIAMCINOLONE ACETONIDE 40 MG/ML IJ SUSP
40.0000 mg | INTRAMUSCULAR | Status: AC | PRN
  Administered 2022-03-13: 40 mg via INTRA_ARTICULAR

## 2022-03-13 MED ORDER — BUPIVACAINE HCL 0.25 % IJ SOLN
5.0000 mL | INTRAMUSCULAR | Status: AC | PRN
  Administered 2022-03-13: 5 mL via INTRA_ARTICULAR

## 2022-03-13 NOTE — Progress Notes (Signed)
Pt state left shoulder pain. Pt state any movement with her arm makes the pain worse. Pt state she takes over the counter pain meds to help ease her pain.  Numeric Pain Rating Scale and Functional Assessment Average Pain 7   In the last MONTH (on 0-10 scale) has pain interfered with the following?  1. General activity like being  able to carry out your everyday physical activities such as walking, climbing stairs, carrying groceries, or moving a chair?  Rating(10)   -BT, -Dye Allergies.

## 2022-03-13 NOTE — Progress Notes (Signed)
   Delcie Ruppert - 61 y.o. female MRN [id number]  Date of birth: [birth year]  Office Visit Note: Visit Date: 03/13/2022 PCP: Darreld Mclean, MD Referred by: Darreld Mclean, MD  Subjective: Chief Complaint  Patient presents with   Left Shoulder - Pain   HPI:  Jaquelynn Wanamaker is a 61 y.o. female who comes in today at the request of Dr. Eduard Roux for planned Left anesthetic glenohumeral arthrogram with fluoroscopic guidance.  The patient has failed conservative care including home exercise, medications, time and activity modification.  This injection will be diagnostic and hopefully therapeutic.  Please see requesting physician notes for further details and justification.    ROS Otherwise per HPI.  Assessment & Plan: Visit Diagnoses:    ICD-10-CM   1. Chronic left shoulder pain  M25.512 Large Joint Inj: L glenohumeral   G89.29 XR C-ARM NO REPORT      Plan: No additional findings.   Meds & Orders: No orders of the defined types were placed in this encounter.   Orders Placed This Encounter  Procedures   Large Joint Inj: L glenohumeral   XR C-ARM NO REPORT    Follow-up: Return if symptoms worsen or fail to improve.   Procedures: Large Joint Inj: L glenohumeral on 03/13/2022 3:59 PM Indications: pain and diagnostic evaluation Details: 22 G 3.5 in needle, fluoroscopy-guided anteromedial approach  Arthrogram: No  Medications: 40 mg triamcinolone acetonide 40 MG/ML; 5 mL bupivacaine 0.25 % Outcome: tolerated well, no immediate complications  There was excellent flow of contrast producing a partial arthrogram of the glenohumeral joint. The patient did have relief of symptoms during the anesthetic phase of the injection. Procedure, treatment alternatives, risks and benefits explained, specific risks discussed. Consent was given by the patient. Immediately prior to procedure a time out was called to verify the correct patient, procedure, equipment, support staff and site/side  marked as required. Patient was prepped and draped in the usual sterile fashion.          Clinical History: No specialty comments available.     Objective:  VS:  HT:    WT:   BMI:     BP:   HR: bpm  TEMP: ( )  RESP:  Physical Exam   Imaging: No results found.

## 2022-03-20 DIAGNOSIS — N1832 Chronic kidney disease, stage 3b: Secondary | ICD-10-CM | POA: Diagnosis not present

## 2022-03-20 DIAGNOSIS — I10 Essential (primary) hypertension: Secondary | ICD-10-CM | POA: Diagnosis not present

## 2022-03-20 DIAGNOSIS — Z944 Liver transplant status: Secondary | ICD-10-CM | POA: Diagnosis not present

## 2022-03-20 DIAGNOSIS — N1419 Nephropathy induced by other drugs, medicaments and biological substances: Secondary | ICD-10-CM | POA: Diagnosis not present

## 2022-03-20 DIAGNOSIS — T451X5A Adverse effect of antineoplastic and immunosuppressive drugs, initial encounter: Secondary | ICD-10-CM | POA: Diagnosis not present

## 2022-03-20 DIAGNOSIS — R252 Cramp and spasm: Secondary | ICD-10-CM | POA: Diagnosis not present

## 2022-03-22 ENCOUNTER — Encounter: Payer: Self-pay | Admitting: Orthopaedic Surgery

## 2022-03-22 ENCOUNTER — Other Ambulatory Visit: Payer: Self-pay

## 2022-03-22 DIAGNOSIS — G8929 Other chronic pain: Secondary | ICD-10-CM

## 2022-03-26 ENCOUNTER — Ambulatory Visit: Payer: Medicare Other | Attending: Orthopaedic Surgery

## 2022-03-26 DIAGNOSIS — M542 Cervicalgia: Secondary | ICD-10-CM | POA: Diagnosis not present

## 2022-03-26 DIAGNOSIS — G8929 Other chronic pain: Secondary | ICD-10-CM | POA: Diagnosis not present

## 2022-03-26 DIAGNOSIS — M6281 Muscle weakness (generalized): Secondary | ICD-10-CM | POA: Diagnosis not present

## 2022-03-26 DIAGNOSIS — R293 Abnormal posture: Secondary | ICD-10-CM | POA: Insufficient documentation

## 2022-03-26 DIAGNOSIS — M25512 Pain in left shoulder: Secondary | ICD-10-CM | POA: Diagnosis not present

## 2022-03-26 DIAGNOSIS — M25612 Stiffness of left shoulder, not elsewhere classified: Secondary | ICD-10-CM | POA: Diagnosis not present

## 2022-03-28 ENCOUNTER — Encounter: Payer: Self-pay | Admitting: Physical Therapy

## 2022-03-28 ENCOUNTER — Ambulatory Visit: Payer: Medicare Other | Admitting: Physical Therapy

## 2022-03-28 DIAGNOSIS — R293 Abnormal posture: Secondary | ICD-10-CM | POA: Diagnosis not present

## 2022-03-28 DIAGNOSIS — M25512 Pain in left shoulder: Secondary | ICD-10-CM | POA: Diagnosis not present

## 2022-03-28 DIAGNOSIS — M6281 Muscle weakness (generalized): Secondary | ICD-10-CM

## 2022-03-28 DIAGNOSIS — M542 Cervicalgia: Secondary | ICD-10-CM | POA: Diagnosis not present

## 2022-03-28 DIAGNOSIS — M25612 Stiffness of left shoulder, not elsewhere classified: Secondary | ICD-10-CM | POA: Diagnosis not present

## 2022-03-28 DIAGNOSIS — G8929 Other chronic pain: Secondary | ICD-10-CM | POA: Diagnosis not present

## 2022-03-28 NOTE — Therapy (Signed)
OUTPATIENT PHYSICAL THERAPY SHOULDER EVALUATION   Patient Name: Alicia [name] MRN: [id number] DOB:01-28-1961, 61 y.o., female Today's Date: 03/28/2022   PT End of Session - 03/28/22 0934     Visit Number 2    Date for PT Re-Evaluation 05/14/22    Authorization Type BCBC Medicare    PT Start Time 0930    PT Stop Time 1015    PT Time Calculation (min) 45 min    Activity Tolerance Patient tolerated treatment well;Patient limited by pain    Behavior During Therapy [facility] for tasks assessed/performed             Past Medical History:  Diagnosis Date   Allergy    Anemia    Asthma    Cellulitis    Cirrhosis ([place])    GERD (gastroesophageal reflux disease)    Hepatic fibrosis    congenital   History of UTI    chronic as a child   Sarcoidosis    Spleen enlarged    Past Surgical History:  Procedure Laterality Date   COLONOSCOPY     CYST EXCISION Left 05/01/2017   Procedure: LEFT INDEX FINGER CYST EXCISION;  Surgeon: Leandrew Koyanagi, MD;  Location: [facility];  Service: Orthopedics;  Laterality: Left;   ESOPHAGOGASTRODUODENOSCOPY     LIVER TRANSPLANT     SPINAL FUSION  12/2007   Double spinal Fusion   VIDEO BRONCHOSCOPY Bilateral 12/08/2015   Procedure: VIDEO BRONCHOSCOPY WITH FLUORO;  Surgeon: Rigoberto Noel, MD;  Location: [facility];  Service: Cardiopulmonary;  Laterality: Bilateral;   Patient Active Problem List   Diagnosis Date Noted   Osteoporosis without current pathological fracture 05/25/2018   Status post liver transplant ([place]) 12/25/2017   Syncope 07/17/2017   Other cirrhosis of liver ([place]) 05/10/2017   Immunocompromised patient ([place]) 05/10/2017   Mucous cyst of digit of left hand 04/11/2017   Cellulitis, abdominal wall 03/01/2017   Normocytic anemia 03/01/2017   Hypokalemia 03/01/2017   CKD (chronic kidney disease), stage III ([place]) 03/01/2017   Abdominal wall cellulitis 03/01/2017   Adrenal insufficiency ([place]) 03/01/2017   Hyperammonemia ([place])    Cellulitis  06/28/2016   Thrombocytopenia ([place]) 06/28/2016   AKI (acute kidney injury) ([place]) 06/28/2016   Multiple pulmonary nodules determined by computed tomography of lung    Chronic cough 09/07/2015   Hypertension, portal ([place]) 09/04/2011   Asthma with chronic obstructive pulmonary disease (COPD) ([place]) 09/04/2011   Vocal cord nodules 07/04/2011   GERD (gastroesophageal reflux disease) 07/04/2011   Sarcoidosis 07/01/2011   Asthma, severe persistent, with upper airway instability 06/13/2011   Congenital hepatic fibrosis 07/03/2000    PCP: Janett Billow Copland  REFERRING PROVIDER: Jean [place]  REFERRING DIAG: [phone]  THERAPY DIAG:  Acute pain of left shoulder  Muscle weakness (generalized)  Cervicalgia  Stiffness of left shoulder, not elsewhere classified  Abnormal posture  Rationale for Evaluation and Treatment Rehabilitation  ONSET DATE: 12/05/21  SUBJECTIVE:  SUBJECTIVE STATEMENT: I have questions about the exercises, I really think the shot made it worse.   PERTINENT HISTORY: Hepaptic Fibrosis, liver transplant 57yr ago Chronic kidney disease  Sarcoidosis Double spinal fusion C4-5 Osteoporosis  Fall on 12/05/21  PAIN:  Are you having pain? Yes: NPRS scale: 5/10 Pain location: L shoulder blade, rhomboid, upper trap  Pain description: dull, achy Aggravating factors: sleeping on L side, overhead, across body  Relieving factors: tylenol   PRECAUTIONS: Fall  WEIGHT BEARING RESTRICTIONS No  FALLS:  Has patient fallen in last 6 months? Yes. Number of falls 1  LIVING ENVIRONMENT: Lives with: lives with their spouse Lives in: House/apartment Stairs: Yes: Internal: 14 steps; on right going up Has following equipment at home: WGilford Rile- 2 wheeled  OCCUPATION: Retired   PLOF:  Independent  PATIENT GOALS to move again without pain   OBJECTIVE:   DIAGNOSTIC FINDINGS:  X-ray of L shoulder findings: No acute or structural abnormalities  PATIENT SURVEYS:  Quick Dash 36.4  COGNITION:  Overall cognitive status: Within functional limits for tasks assessed     SENSATION: WFL  POSTURE: Forward head, flexed trunk, rounded shoulders   UPPER EXTREMITY ROM:   Active ROM LEFT RIGHT  Shoulder flexion End range pain WFL  Shoulder extension    Shoulder abduction Pain >90d WFL  Shoulder adduction    Shoulder internal rotation 20d w/pain   Shoulder external rotation 80 w/pain   Elbow flexion WNL   Elbow extension WNL   Wrist flexion    Wrist extension    Wrist ulnar deviation    Wrist radial deviation    Wrist pronation    Wrist supination    (Blank rows = not tested)  UPPER EXTREMITY MMT: all MMT on L with pain  MMT LEFT RIGHT  Shoulder flexion 3+ 4  Shoulder extension    Shoulder abduction 2+ 4+  Shoulder adduction 2+ 4  Shoulder internal rotation 4   Shoulder external rotation 2+   Middle trapezius    Lower trapezius    Elbow flexion 4+ 4+  Elbow extension 4+ 4+  Wrist flexion    Wrist extension    Wrist ulnar deviation    Wrist radial deviation    Wrist pronation    Wrist supination    Grip strength (lbs)    (Blank rows = not tested)  Cervical AROM Pain w/flexion, R rotation, L lateral flexion Decreased joint mobility and cervical musculature tightness    SHOULDER SPECIAL TESTS:  Impingement tests: Neer impingement test: positive , Hawkins/Kennedy impingement test: positive , and Painful arc test: positive   Rotator cuff assessment: Empty can test: positive  and Full can test: positive     JOINT MOBILITY TESTING:  PROM to L shoulder, pain at end range flexion and ER, pain and muscle guarding >100 ABD, limited range into IR   PALPATION:  TTP anterior shoulder C5-7, trigger points in L upper trap and rhomboids   TODAY'S  TREATMENT:   03/28/22 Reviewed HEP she had questions Yellow tband row, extension 2x10, ER and IR 2x10 3# shrugs with upper trap and levator stretches Manual: STM to the left upper trap and rhomboid and into the bicep origin PROM of the left shoulder all motions    PATIENT EDUCATION: Education details: POC, anatomy  Person educated: Patient Education method: ECustomer service managerEducation comprehension: verbalized understanding and returned demonstration   HOME EXERCISE PROGRAM: [id number]  ASSESSMENT:  CLINICAL IMPRESSION: Patient tolerated exercises well, some pain with ER.  She is very tight and tender with knots in the left upper trap, the rhomboid and neck area, she is very tender in the biceps origin,  very tight and sore with stretching the anterior chest and biceps area.  She reported good relief with the STM and demonstrated good PROM  REHAB POTENTIAL: Good  CLINICAL DECISION MAKING: Stable/uncomplicated  EVALUATION COMPLEXITY: Low   GOALS: Goals reviewed with patient? Yes  SHORT TERM GOALS: Target date: 04/16/22  Patient will be independent with initial HEP.  Goal status: partially met    LONG TERM GOALS: Target date: 05/14/22  1.  Patient to demonstrate improved upright posture with posterior shoulder girdle engaged to promote improved glenohumeral joint mobility. Goal status:partially met  2.  Patient to improve L shoulder and cervical AROM to [facility] without pain provocation to allow for increased ease of ADLs and reaching overhead.  Goal status: INITIAL  3.  Patient will demonstrate improved functional UE strength as demonstrated by >= 4/5 in all muscle groups. Goal status: INITIAL  4.  Patient will report less than or equal to 20 on QuickDASH to demonstrate improved functional ability.  Baseline: 36.4 Goal status: INITIAL  5.  Patient will report 0/10 on NPRS for L shoulder to be able to complete household chores and ADLs without pain and  difficulty.  Baseline: 6/10 Goal status: INITIAL   PLAN: PT FREQUENCY: 1-2x/week  PT DURATION: 6 weeks  PLANNED INTERVENTIONS: Therapeutic exercises, Therapeutic activity, Neuromuscular re-education, Balance training, Gait training, Patient/Family education, Joint mobilization, Dry Needling, Spinal mobilization, Cryotherapy, Moist heat, Taping, and Manual therapy  PLAN FOR NEXT SESSION: review HEP, PROM, shoulder strengthening, see how the STM did, could try DN as well   Winnie Umali W, PT 03/28/2022, 9:35 AM

## 2022-04-09 ENCOUNTER — Ambulatory Visit: Payer: Medicare Other | Attending: Orthopaedic Surgery | Admitting: Physical Therapy

## 2022-04-09 ENCOUNTER — Encounter: Payer: Self-pay | Admitting: Physical Therapy

## 2022-04-09 DIAGNOSIS — M542 Cervicalgia: Secondary | ICD-10-CM | POA: Insufficient documentation

## 2022-04-09 DIAGNOSIS — M25512 Pain in left shoulder: Secondary | ICD-10-CM | POA: Insufficient documentation

## 2022-04-09 DIAGNOSIS — R293 Abnormal posture: Secondary | ICD-10-CM | POA: Insufficient documentation

## 2022-04-09 DIAGNOSIS — M6281 Muscle weakness (generalized): Secondary | ICD-10-CM | POA: Insufficient documentation

## 2022-04-09 DIAGNOSIS — M25612 Stiffness of left shoulder, not elsewhere classified: Secondary | ICD-10-CM | POA: Insufficient documentation

## 2022-04-09 NOTE — Therapy (Signed)
OUTPATIENT PHYSICAL THERAPY SHOULDER Treatment   Patient Name: Alicia [name] MRN: [id number] DOB:1961-04-17, 61 y.o., female Today's Date: 04/09/2022   PT End of Session - 04/09/22 0845     Visit Number 3    Date for PT Re-Evaluation 05/14/22    Authorization Type BC[employer] Medicare    PT Start Time 0842    PT Stop Time 0925    PT Time Calculation (min) 43 min    Activity Tolerance Patient tolerated treatment well;Patient limited by pain    Behavior During Therapy [facility] for tasks assessed/performed             Past Medical History:  Diagnosis Date   Allergy    Anemia    Asthma    Cellulitis    Cirrhosis ([place])    GERD (gastroesophageal reflux disease)    Hepatic fibrosis    congenital   History of UTI    chronic as a child   Sarcoidosis    Spleen enlarged    Past Surgical History:  Procedure Laterality Date   COLONOSCOPY     CYST EXCISION Left 05/01/2017   Procedure: LEFT INDEX FINGER CYST EXCISION;  Surgeon: Leandrew Koyanagi, MD;  Location: [place];  Service: Orthopedics;  Laterality: Left;   ESOPHAGOGASTRODUODENOSCOPY     LIVER TRANSPLANT     SPINAL FUSION  12/2007   Double spinal Fusion   VIDEO BRONCHOSCOPY Bilateral 12/08/2015   Procedure: VIDEO BRONCHOSCOPY WITH FLUORO;  Surgeon: Rigoberto Noel, MD;  Location: [place];  Service: Cardiopulmonary;  Laterality: Bilateral;   Patient Active Problem List   Diagnosis Date Noted   Osteoporosis without current pathological fracture 05/25/2018   Status post liver transplant ([place]) 12/25/2017   Syncope 07/17/2017   Other cirrhosis of liver ([place]) 05/10/2017   Immunocompromised patient ([place]) 05/10/2017   Mucous cyst of digit of left hand 04/11/2017   Cellulitis, abdominal wall 03/01/2017   Normocytic anemia 03/01/2017   Hypokalemia 03/01/2017   CKD (chronic kidney disease), stage III ([place]) 03/01/2017   Abdominal wall cellulitis 03/01/2017   Adrenal insufficiency ([place]) 03/01/2017   Hyperammonemia ([place])    Cellulitis  06/28/2016   Thrombocytopenia ([place]) 06/28/2016   AKI (acute kidney injury) ([place]) 06/28/2016   Multiple pulmonary nodules determined by computed tomography of lung    Chronic cough 09/07/2015   Hypertension, portal ([place]) 09/04/2011   Asthma with chronic obstructive pulmonary disease (COPD) ([place]) 09/04/2011   Vocal cord nodules 07/04/2011   GERD (gastroesophageal reflux disease) 07/04/2011   Sarcoidosis 07/01/2011   Asthma, severe persistent, with upper airway instability 06/13/2011   Congenital hepatic fibrosis 07/03/2000    PCP: Janett Billow Copland  REFERRING PROVIDER: Jean Rosenthal  REFERRING DIAG: [phone]  THERAPY DIAG:  Acute pain of left shoulder  Muscle weakness (generalized)  Cervicalgia  Stiffness of left shoulder, not elsewhere classified  Abnormal posture  Rationale for Evaluation and Treatment Rehabilitation  ONSET DATE: 12/05/21  SUBJECTIVE:  SUBJECTIVE STATEMENT: I felt pretty good after the last session, but it seems to have tightened up and went into spasm again   PERTINENT HISTORY: Hepaptic Fibrosis, liver transplant 27yr ago Chronic kidney disease  Sarcoidosis Double spinal fusion C4-5 Osteoporosis  Fall on 12/05/21  PAIN:  Are you having pain? Yes: NPRS scale: 4/10 Pain location: L shoulder blade, rhomboid, upper trap  Pain description: dull, achy Aggravating factors: sleeping on L side, overhead, across body  Relieving factors: tylenol   PRECAUTIONS: Fall  WEIGHT BEARING RESTRICTIONS No  FALLS:  Has patient fallen in last 6 months? Yes. Number of falls 1  LIVING ENVIRONMENT: Lives with: lives with their spouse Lives in: House/apartment Stairs: Yes: Internal: 14 steps; on right going up Has following equipment at home: WGilford Rile- 2  wheeled  OCCUPATION: Retired   PLOF: Independent  PATIENT GOALS to move again without pain   OBJECTIVE:   DIAGNOSTIC FINDINGS:  X-ray of L shoulder findings: No acute or structural abnormalities  PATIENT SURVEYS:  Quick Dash 36.4  COGNITION:  Overall cognitive status: Within functional limits for tasks assessed     SENSATION: WFL  POSTURE: Forward head, flexed trunk, rounded shoulders   UPPER EXTREMITY ROM:   Active ROM LEFT RIGHT  Shoulder flexion End range pain WFL  Shoulder extension    Shoulder abduction Pain >90d WFL  Shoulder adduction    Shoulder internal rotation 20d w/pain   Shoulder external rotation 80 w/pain   Elbow flexion WNL   Elbow extension WNL   Wrist flexion    Wrist extension    Wrist ulnar deviation    Wrist radial deviation    Wrist pronation    Wrist supination    (Blank rows = not tested)  UPPER EXTREMITY MMT: all MMT on L with pain  MMT LEFT RIGHT  Shoulder flexion 3+ 4  Shoulder extension    Shoulder abduction 2+ 4+  Shoulder adduction 2+ 4  Shoulder internal rotation 4   Shoulder external rotation 2+   Middle trapezius    Lower trapezius    Elbow flexion 4+ 4+  Elbow extension 4+ 4+  Wrist flexion    Wrist extension    Wrist ulnar deviation    Wrist radial deviation    Wrist pronation    Wrist supination    Grip strength (lbs)    (Blank rows = not tested)  Cervical AROM Pain w/flexion, R rotation, L lateral flexion Decreased joint mobility and cervical musculature tightness    SHOULDER SPECIAL TESTS:  Impingement tests: Neer impingement test: positive , Hawkins/Kennedy impingement test: positive , and Painful arc test: positive   Rotator cuff assessment: Empty can test: positive  and Full can test: positive     JOINT MOBILITY TESTING:  PROM to L shoulder, pain at end range flexion and ER, pain and muscle guarding >100 ABD, limited range into IR   PALPATION:  TTP anterior shoulder C5-7, trigger points in L  upper trap and rhomboids   TODAY'S TREATMENT:  04/09/22 UBE level 1 x 4 minutes, backward caused pain in the shoulder blade Rows 10# 2x10 Lats 15# 2x10 Triceps 15# 2x10 Biceps 5# 2x 10 STM to the right upper trap, neck and rhomboid, very tight and sore MHP/IFC to the right upper trap and rhomboid   03/28/22 Reviewed HEP she had questions Yellow tband row, extension 2x10, ER and IR 2x10 3# shrugs with upper trap and levator stretches Manual: STM to the left upper trap and rhomboid and  into the bicep origin PROM of the left shoulder all motions    PATIENT EDUCATION: Education details: POC, anatomy  Person educated: Patient Education method: Customer service manager Education comprehension: verbalized understanding and returned demonstration   HOME EXERCISE PROGRAM: [id number]  ASSESSMENT:  CLINICAL IMPRESSION: I added additional exercises and worked on the rhomboid and upper traps, she has a lot of knots and is very tender and sore, we discussed DN and wants to wait, I finished with heat and estim after STM to see if this would help with her soreness  REHAB POTENTIAL: Good  CLINICAL DECISION MAKING: Stable/uncomplicated  EVALUATION COMPLEXITY: Low   GOALS: Goals reviewed with patient? Yes  SHORT TERM GOALS: Target date: 04/16/22  Patient will be independent with initial HEP.  Goal status: partially met    LONG TERM GOALS: Target date: 05/14/22  1.  Patient to demonstrate improved upright posture with posterior shoulder girdle engaged to promote improved glenohumeral joint mobility. Goal status:partially met  2.  Patient to improve L shoulder and cervical AROM to [facility] without pain provocation to allow for increased ease of ADLs and reaching overhead.  Goal status: ongoing  3.  Patient will demonstrate improved functional UE strength as demonstrated by >= 4/5 in all muscle groups. Goal status: ongoing  4.  Patient will report less than or equal to 20 on  QuickDASH to demonstrate improved functional ability.  Baseline: 36.4 Goal status:ongoing  5.  Patient will report 0/10 on NPRS for L shoulder to be able to complete household chores and ADLs without pain and difficulty.  Baseline: 6/10 Goal status:ongoing   PLAN: PT FREQUENCY: 1-2x/week  PT DURATION: 6 weeks  PLANNED INTERVENTIONS: Therapeutic exercises, Therapeutic activity, Neuromuscular re-education, Balance training, Gait training, Patient/Family education, Joint mobilization, Dry Needling, Spinal mobilization, Cryotherapy, Moist heat, Taping, and Manual therapy  PLAN FOR NEXT SESSION: See how todays treatment went  Ashlley Booher W, PT 04/09/2022, 9:24 AM

## 2022-04-11 ENCOUNTER — Encounter: Payer: Self-pay | Admitting: Physical Therapy

## 2022-04-11 ENCOUNTER — Ambulatory Visit: Payer: Medicare Other | Admitting: Physical Therapy

## 2022-04-11 DIAGNOSIS — R293 Abnormal posture: Secondary | ICD-10-CM | POA: Diagnosis not present

## 2022-04-11 DIAGNOSIS — M542 Cervicalgia: Secondary | ICD-10-CM

## 2022-04-11 DIAGNOSIS — M25612 Stiffness of left shoulder, not elsewhere classified: Secondary | ICD-10-CM | POA: Diagnosis not present

## 2022-04-11 DIAGNOSIS — M6281 Muscle weakness (generalized): Secondary | ICD-10-CM

## 2022-04-11 DIAGNOSIS — M25512 Pain in left shoulder: Secondary | ICD-10-CM

## 2022-04-11 NOTE — Therapy (Signed)
OUTPATIENT PHYSICAL THERAPY SHOULDER Treatment   Patient Name: Alicia [name] MRN: [id number] DOB:Mar 22, 1961, 61 y.o., female Today's Date: 04/11/2022   PT End of Session - 04/11/22 0930     Visit Number 4    Date for PT Re-Evaluation 05/14/22    Authorization Type BC[employer] Medicare    PT Start Time 0930    PT Stop Time 1022    PT Time Calculation (min) 52 min    Activity Tolerance Patient tolerated treatment well;Patient limited by pain    Behavior During Therapy WFL for tasks assessed/performed             Past Medical History:  Diagnosis Date   Allergy    Anemia    Asthma    Cellulitis    Cirrhosis ([place])    GERD (gastroesophageal reflux disease)    Hepatic fibrosis    congenital   History of UTI    chronic as a child   Sarcoidosis    Spleen enlarged    Past Surgical History:  Procedure Laterality Date   COLONOSCOPY     CYST EXCISION Left 05/01/2017   Procedure: LEFT INDEX FINGER CYST EXCISION;  Surgeon: Leandrew Koyanagi, MD;  Location: [place];  Service: Orthopedics;  Laterality: Left;   ESOPHAGOGASTRODUODENOSCOPY     LIVER TRANSPLANT     SPINAL FUSION  12/2007   Double spinal Fusion   VIDEO BRONCHOSCOPY Bilateral 12/08/2015   Procedure: VIDEO BRONCHOSCOPY WITH FLUORO;  Surgeon: Rigoberto Noel, MD;  Location: [place];  Service: Cardiopulmonary;  Laterality: Bilateral;   Patient Active Problem List   Diagnosis Date Noted   Osteoporosis without current pathological fracture 05/25/2018   Status post liver transplant ([place]) 12/25/2017   Syncope 07/17/2017   Other cirrhosis of liver ([place]) 05/10/2017   Immunocompromised patient ([place]) 05/10/2017   Mucous cyst of digit of left hand 04/11/2017   Cellulitis, abdominal wall 03/01/2017   Normocytic anemia 03/01/2017   Hypokalemia 03/01/2017   CKD (chronic kidney disease), stage III ([place]) 03/01/2017   Abdominal wall cellulitis 03/01/2017   Adrenal insufficiency ([place]) 03/01/2017   Hyperammonemia ([place])    Cellulitis  06/28/2016   Thrombocytopenia ([place]) 06/28/2016   AKI (acute kidney injury) ([place]) 06/28/2016   Multiple pulmonary nodules determined by computed tomography of lung    Chronic cough 09/07/2015   Hypertension, portal ([place]) 09/04/2011   Asthma with chronic obstructive pulmonary disease (COPD) ([place]) 09/04/2011   Vocal cord nodules 07/04/2011   GERD (gastroesophageal reflux disease) 07/04/2011   Sarcoidosis 07/01/2011   Asthma, severe persistent, with upper airway instability 06/13/2011   Congenital hepatic fibrosis 07/03/2000    PCP: Janett Billow Copland  REFERRING PROVIDER: Jean Rosenthal  REFERRING DIAG: [phone]  THERAPY DIAG:  Acute pain of left shoulder  Muscle weakness (generalized)  Cervicalgia  Stiffness of left shoulder, not elsewhere classified  Abnormal posture  Rationale for Evaluation and Treatment Rehabilitation  ONSET DATE: 12/05/21  SUBJECTIVE:  SUBJECTIVE STATEMENT: "I think Im getting better, not as much pain" Pain is not a constant   PERTINENT HISTORY: Hepaptic Fibrosis, liver transplant 61yr ago Chronic kidney disease  Sarcoidosis Double spinal fusion C4-5 Osteoporosis  Fall on 12/05/21  PAIN:  Are you having pain? Yes: NPRS scale: 0/10 Pain location: L shoulder blade, rhomboid, upper trap  Pain description: dull, achy Aggravating factors: sleeping on L side, overhead, across body  Relieving factors: tylenol   PRECAUTIONS: Fall  WEIGHT BEARING RESTRICTIONS No  FALLS:  Has patient fallen in last 6 months? Yes. Number of falls 1  LIVING ENVIRONMENT: Lives with: lives with their spouse Lives in: House/apartment Stairs: Yes: Internal: 14 steps; on right going up Has following equipment at home: WGilford Rile- 2 wheeled  OCCUPATION: Retired   PLOF:  Independent  PATIENT GOALS to move again without pain   OBJECTIVE:   DIAGNOSTIC FINDINGS:  X-ray of L shoulder findings: No acute or structural abnormalities  PATIENT SURVEYS:  Quick Dash 36.4  COGNITION:  Overall cognitive status: Within functional limits for tasks assessed     SENSATION: WFL  POSTURE: Forward head, flexed trunk, rounded shoulders   UPPER EXTREMITY ROM:   Active ROM LEFT RIGHT  Shoulder flexion End range pain WFL  Shoulder extension    Shoulder abduction Pain >90d WFL  Shoulder adduction    Shoulder internal rotation 20d w/pain   Shoulder external rotation 80 w/pain   Elbow flexion WNL   Elbow extension WNL   Wrist flexion    Wrist extension    Wrist ulnar deviation    Wrist radial deviation    Wrist pronation    Wrist supination    (Blank rows = not tested)  UPPER EXTREMITY MMT: all MMT on L with pain  MMT LEFT RIGHT  Shoulder flexion 3+ 4  Shoulder extension    Shoulder abduction 2+ 4+  Shoulder adduction 2+ 4  Shoulder internal rotation 4   Shoulder external rotation 2+   Middle trapezius    Lower trapezius    Elbow flexion 4+ 4+  Elbow extension 4+ 4+  Wrist flexion    Wrist extension    Wrist ulnar deviation    Wrist radial deviation    Wrist pronation    Wrist supination    Grip strength (lbs)    (Blank rows = not tested)  Cervical AROM Pain w/flexion, R rotation, L lateral flexion Decreased joint mobility and cervical musculature tightness    SHOULDER SPECIAL TESTS:  Impingement tests: Neer impingement test: positive , Hawkins/Kennedy impingement test: positive , and Painful arc test: positive   Rotator cuff assessment: Empty can test: positive  and Full can test: positive     JOINT MOBILITY TESTING:  PROM to L shoulder, pain at end range flexion and ER, pain and muscle guarding >100 ABD, limited range into IR   PALPATION:  TTP anterior shoulder C5-7, trigger points in L upper trap and rhomboids   TODAY'S  TREATMENT:  04/11/22 UBE L1 x 2 min each    AROM WaTE bar Shoulder, Flex, Ext, IR x10    ER yellow 2x10 Rows 10# 2x10 Lats 15# 2x10 Shoulder Ext green 2x10  STM to the right upper trap, neck and rhomboid, very tight and sore MHP/IFC to the right upper trap and rhomboid   04/09/22 UBE level 1 x 4 minutes, backward caused pain in the shoulder blade Rows 10# 2x10 Lats 15# 2x10 Triceps 15# 2x10 Biceps 5# 2x 10 STM to the right  upper trap, neck and rhomboid, very tight and sore MHP/IFC to the right upper trap and rhomboid   03/28/22 Reviewed HEP she had questions Yellow tband row, extension 2x10, ER and IR 2x10 3# shrugs with upper trap and levator stretches Manual: STM to the left upper trap and rhomboid and into the bicep origin PROM of the left shoulder all motions    PATIENT EDUCATION: Education details: POC, anatomy  Person educated: Patient Education method: Customer service manager Education comprehension: verbalized understanding and returned demonstration   HOME EXERCISE PROGRAM: [id number]  ASSESSMENT:  CLINICAL IMPRESSION: Pt enters with reports of improvement. All interventions completed well. She did report some spasms in the L rhomboid area with shoulder Er and extensions. Posiitve response to STM with improved tissue elasticity. Modality for pain and tightness.   REHAB POTENTIAL: Good  CLINICAL DECISION MAKING: Stable/uncomplicated  EVALUATION COMPLEXITY: Low   GOALS: Goals reviewed with patient? Yes  SHORT TERM GOALS: Target date: 04/16/22  Patient will be independent with initial HEP.  Goal status: partially met    LONG TERM GOALS: Target date: 05/14/22  1.  Patient to demonstrate improved upright posture with posterior shoulder girdle engaged to promote improved glenohumeral joint mobility. Goal status:partially met  2.  Patient to improve L shoulder and cervical AROM to [facility] without pain provocation to allow for increased ease of ADLs and  reaching overhead.  Goal status: ongoing  3.  Patient will demonstrate improved functional UE strength as demonstrated by >= 4/5 in all muscle groups. Goal status: ongoing  4.  Patient will report less than or equal to 20 on QuickDASH to demonstrate improved functional ability.  Baseline: 36.4 Goal status:ongoing  5.  Patient will report 0/10 on NPRS for L shoulder to be able to complete household chores and ADLs without pain and difficulty.  Baseline: 6/10 Goal status:ongoing   PLAN: PT FREQUENCY: 1-2x/week  PT DURATION: 6 weeks  PLANNED INTERVENTIONS: Therapeutic exercises, Therapeutic activity, Neuromuscular re-education, Balance training, Gait training, Patient/Family education, Joint mobilization, Dry Needling, Spinal mobilization, Cryotherapy, Moist heat, Taping, and Manual therapy  PLAN FOR NEXT SESSION: See how todays treatment went  Scot Jun, PTA 04/11/2022, 10:12 AM

## 2022-04-16 ENCOUNTER — Ambulatory Visit: Payer: Medicare Other | Admitting: Physical Therapy

## 2022-04-16 ENCOUNTER — Encounter: Payer: Self-pay | Admitting: Physical Therapy

## 2022-04-16 DIAGNOSIS — M25612 Stiffness of left shoulder, not elsewhere classified: Secondary | ICD-10-CM

## 2022-04-16 DIAGNOSIS — M542 Cervicalgia: Secondary | ICD-10-CM | POA: Diagnosis not present

## 2022-04-16 DIAGNOSIS — R293 Abnormal posture: Secondary | ICD-10-CM

## 2022-04-16 DIAGNOSIS — M6281 Muscle weakness (generalized): Secondary | ICD-10-CM | POA: Diagnosis not present

## 2022-04-16 DIAGNOSIS — M25512 Pain in left shoulder: Secondary | ICD-10-CM

## 2022-04-16 NOTE — Therapy (Signed)
OUTPATIENT PHYSICAL THERAPY SHOULDER Treatment   Patient Name: Alicia [name] MRN: [id number] DOB:September 07, 1961, 61 y.o., female Today's Date: 04/16/2022   PT End of Session - 04/16/22 0843     Visit Number 5    Date for PT Re-Evaluation 05/14/22    Authorization Type BCBC Medicare    PT Start Time 0840    PT Stop Time 0925    PT Time Calculation (min) 45 min    Activity Tolerance Patient tolerated treatment well;Patient limited by pain    Behavior During Therapy [facility] for tasks assessed/performed             Past Medical History:  Diagnosis Date   Allergy    Anemia    Asthma    Cellulitis    Cirrhosis ([place])    GERD (gastroesophageal reflux disease)    Hepatic fibrosis    congenital   History of UTI    chronic as a child   Sarcoidosis    Spleen enlarged    Past Surgical History:  Procedure Laterality Date   COLONOSCOPY     CYST EXCISION Left 05/01/2017   Procedure: LEFT INDEX FINGER CYST EXCISION;  Surgeon: Leandrew Koyanagi, MD;  Location: [place];  Service: Orthopedics;  Laterality: Left;   ESOPHAGOGASTRODUODENOSCOPY     LIVER TRANSPLANT     SPINAL FUSION  12/2007   Double spinal Fusion   VIDEO BRONCHOSCOPY Bilateral 12/08/2015   Procedure: VIDEO BRONCHOSCOPY WITH FLUORO;  Surgeon: Rigoberto Noel, MD;  Location: [place];  Service: Cardiopulmonary;  Laterality: Bilateral;   Patient Active Problem List   Diagnosis Date Noted   Osteoporosis without current pathological fracture 05/25/2018   Status post liver transplant ([place]) 12/25/2017   Syncope 07/17/2017   Other cirrhosis of liver ([place]) 05/10/2017   Immunocompromised patient ([place]) 05/10/2017   Mucous cyst of digit of left hand 04/11/2017   Cellulitis, abdominal wall 03/01/2017   Normocytic anemia 03/01/2017   Hypokalemia 03/01/2017   CKD (chronic kidney disease), stage III ([place]) 03/01/2017   Abdominal wall cellulitis 03/01/2017   Adrenal insufficiency ([place]) 03/01/2017   Hyperammonemia ([place])    Cellulitis  06/28/2016   Thrombocytopenia ([place]) 06/28/2016   AKI (acute kidney injury) ([place]) 06/28/2016   Multiple pulmonary nodules determined by computed tomography of lung    Chronic cough 09/07/2015   Hypertension, portal ([place]) 09/04/2011   Asthma with chronic obstructive pulmonary disease (COPD) ([place]) 09/04/2011   Vocal cord nodules 07/04/2011   GERD (gastroesophageal reflux disease) 07/04/2011   Sarcoidosis 07/01/2011   Asthma, severe persistent, with upper airway instability 06/13/2011   Congenital hepatic fibrosis 07/03/2000    PCP: Janett Billow Copland  REFERRING PROVIDER: Jean Rosenthal  REFERRING DIAG: [id number]  THERAPY DIAG:  Acute pain of left shoulder  Muscle weakness (generalized)  Cervicalgia  Stiffness of left shoulder, not elsewhere classified  Abnormal posture  Rationale for Evaluation and Treatment Rehabilitation  ONSET DATE: 12/05/21  SUBJECTIVE:  SUBJECTIVE STATEMENT: "I think I want to try the needles, the massage helps a lot"   PERTINENT HISTORY: Hepaptic Fibrosis, liver transplant 79yr ago Chronic kidney disease  Sarcoidosis Double spinal fusion C4-5 Osteoporosis  Fall on 12/05/21  PAIN:  Are you having pain? Yes: NPRS scale: 2/10 Pain location: L shoulder blade, rhomboid, upper trap  Pain description: dull, achy Aggravating factors: sleeping on L side, overhead, across body, gardening pain can be up to 5/10  Relieving factors: tylenol massage, no using it  PRECAUTIONS: Fall  WEIGHT BEARING RESTRICTIONS No  FALLS:  Has patient fallen in last 6 months? Yes. Number of falls 1  LIVING ENVIRONMENT: Lives with: lives with their spouse Lives in: House/apartment Stairs: Yes: Internal: 14 steps; on right going up Has following equipment at home: WEnvironmental consultant- 2  wheeled  OCCUPATION: Retired   PLOF: Independent  PATIENT GOALS to move again without pain   OBJECTIVE:   DIAGNOSTIC FINDINGS:  X-ray of L shoulder findings: No acute or structural abnormalities  PATIENT SURVEYS:  Quick Dash 36.4  COGNITION:  Overall cognitive status: Within functional limits for tasks assessed     SENSATION: WFL  POSTURE: Forward head, flexed trunk, rounded shoulders   UPPER EXTREMITY ROM:   Active ROM LEFT RIGHT Left AROM 04/16/22  Shoulder flexion End range pain WFL   Shoulder extension     Shoulder abduction Pain >90d WFL   Shoulder adduction     Shoulder internal rotation 20d w/pain  40  Shoulder external rotation 80 w/pain    Elbow flexion WNL    Elbow extension WNL    Wrist flexion     Wrist extension     Wrist ulnar deviation     Wrist radial deviation     Wrist pronation     Wrist supination     (Blank rows = not tested)  UPPER EXTREMITY MMT: all MMT on L with pain  MMT LEFT RIGHT  Shoulder flexion 3+ 4  Shoulder extension    Shoulder abduction 2+ 4+  Shoulder adduction 2+ 4  Shoulder internal rotation 4   Shoulder external rotation 2+   Middle trapezius    Lower trapezius    Elbow flexion 4+ 4+  Elbow extension 4+ 4+  Wrist flexion    Wrist extension    Wrist ulnar deviation    Wrist radial deviation    Wrist pronation    Wrist supination    Grip strength (lbs)    (Blank rows = not tested)  Cervical AROM Pain w/flexion, R rotation, L lateral flexion Decreased joint mobility and cervical musculature tightness    SHOULDER SPECIAL TESTS:  Impingement tests: Neer impingement test: positive , Hawkins/Kennedy impingement test: positive , and Painful arc test: positive   Rotator cuff assessment: Empty can test: positive  and Full can test: positive     JOINT MOBILITY TESTING:  PROM to L shoulder, pain at end range flexion and ER, pain and muscle guarding >100 ABD, limited range into IR   PALPATION:  TTP anterior  shoulder C5-7, trigger points in L upper trap and rhomboids   TODAY'S TREATMENT:  04/16/22 UBE level 2 x 5 minutes Rows 10# 2x10 Lats 15# 2x10 2# [place] Bar flexion Red tband ER/ horizontal abduction Behind back small abduction DN to the left upper trap, the left rhomboid and the left cervical area with good LTR's STM to the above   04/11/22 UBE L1 x 2 min each    AROM WaTE bar Shoulder,  Flex, Ext, IR x10    ER yellow 2x10 Rows 10# 2x10 Lats 15# 2x10 Shoulder Ext green 2x10  STM to the right upper trap, neck and rhomboid, very tight and sore MHP/IFC to the right upper trap and rhomboid   04/09/22 UBE level 1 x 4 minutes, backward caused pain in the shoulder blade Rows 10# 2x10 Lats 15# 2x10 Triceps 15# 2x10 Biceps 5# 2x 10 STM to the right upper trap, neck and rhomboid, very tight and sore MHP/IFC to the right upper trap and rhomboid   PATIENT EDUCATION: Education details: POC, anatomy  Person educated: Patient Education method: Customer service manager Education comprehension: verbalized understanding and returned demonstration   HOME EXERCISE PROGRAM: [id number]  ASSESSMENT:  CLINICAL IMPRESSION: Good improvement with left shoulder IR, she still with spasms and pain in the upper trap, neck and rhomboid, requested that we try dry needling.  Followed by STM, had good LTR's and seemed to have less pain when leaving  REHAB POTENTIAL: Good  CLINICAL DECISION MAKING: Stable/uncomplicated  EVALUATION COMPLEXITY: Low   GOALS: Goals reviewed with patient? Yes  SHORT TERM GOALS: Target date: 04/16/22  Patient will be independent with initial HEP.  Goal status: met    LONG TERM GOALS: Target date: 05/14/22  1.  Patient to demonstrate improved upright posture with posterior shoulder girdle engaged to promote improved glenohumeral joint mobility. Goal status:partially met  2.  Patient to improve L shoulder and cervical AROM to [facility] without pain provocation to  allow for increased ease of ADLs and reaching overhead.  Goal status: ongoing  3.  Patient will demonstrate improved functional UE strength as demonstrated by >= 4/5 in all muscle groups. Goal status: ongoing  4.  Patient will report less than or equal to 20 on QuickDASH to demonstrate improved functional ability.  Baseline: 36.4 Goal status:ongoing  5.  Patient will report 0/10 on NPRS for L shoulder to be able to complete household chores and ADLs without pain and difficulty.  Baseline: 6/10 Goal status:ongoing   PLAN: PT FREQUENCY: 1-2x/week  PT DURATION: 6 weeks  PLANNED INTERVENTIONS: Therapeutic exercises, Therapeutic activity, Neuromuscular re-education, Balance training, Gait training, Patient/Family education, Joint mobilization, Dry Needling, Spinal mobilization, Cryotherapy, Moist heat, Taping, and Manual therapy  PLAN FOR NEXT SESSION: See how todays treatment went  Wes Lezotte W, PT 04/16/2022, 8:44 AM

## 2022-04-19 ENCOUNTER — Encounter: Payer: Self-pay | Admitting: Pulmonary Disease

## 2022-04-19 ENCOUNTER — Ambulatory Visit: Payer: Medicare Other | Admitting: Physical Therapy

## 2022-04-19 ENCOUNTER — Encounter: Payer: Self-pay | Admitting: Physical Therapy

## 2022-04-19 DIAGNOSIS — R293 Abnormal posture: Secondary | ICD-10-CM

## 2022-04-19 DIAGNOSIS — M25512 Pain in left shoulder: Secondary | ICD-10-CM

## 2022-04-19 DIAGNOSIS — M542 Cervicalgia: Secondary | ICD-10-CM

## 2022-04-19 DIAGNOSIS — M6281 Muscle weakness (generalized): Secondary | ICD-10-CM

## 2022-04-19 DIAGNOSIS — M25612 Stiffness of left shoulder, not elsewhere classified: Secondary | ICD-10-CM | POA: Diagnosis not present

## 2022-04-19 NOTE — Therapy (Signed)
OUTPATIENT PHYSICAL THERAPY SHOULDER Treatment   Patient Name: Alicia [name] MRN: [id number] DOB:[birth year], 61 y.o., female Today's Date: 04/19/2022   PT End of Session - 04/19/22 0846     Visit Number 6    Date for PT Re-Evaluation 05/14/22    Authorization Type BCBC Medicare    PT Start Time [phone]    PT Stop Time 0930    PT Time Calculation (min) 48 min    Activity Tolerance Patient tolerated treatment well;Patient limited by pain    Behavior During Therapy [facility] for tasks assessed/performed             Past Medical History:  Diagnosis Date   Allergy    Anemia    Asthma    Cellulitis    Cirrhosis ([place])    GERD (gastroesophageal reflux disease)    Hepatic fibrosis    congenital   History of UTI    chronic as a child   Sarcoidosis    Spleen enlarged    Past Surgical History:  Procedure Laterality Date   COLONOSCOPY     CYST EXCISION Left 05/01/2017   Procedure: LEFT INDEX FINGER CYST EXCISION;  Surgeon: Leandrew Koyanagi, MD;  Location: [place];  Service: Orthopedics;  Laterality: Left;   ESOPHAGOGASTRODUODENOSCOPY     LIVER TRANSPLANT     SPINAL FUSION  12/2007   Double spinal Fusion   VIDEO BRONCHOSCOPY Bilateral 12/08/2015   Procedure: VIDEO BRONCHOSCOPY WITH FLUORO;  Surgeon: Rigoberto Noel, MD;  Location: [place];  Service: Cardiopulmonary;  Laterality: Bilateral;   Patient Active Problem List   Diagnosis Date Noted   Osteoporosis without current pathological fracture 05/25/2018   Status post liver transplant ([place]) 12/25/2017   Syncope 07/17/2017   Other cirrhosis of liver ([place]) 05/10/2017   Immunocompromised patient ([place]) 05/10/2017   Mucous cyst of digit of left hand 04/11/2017   Cellulitis, abdominal wall 03/01/2017   Normocytic anemia 03/01/2017   Hypokalemia 03/01/2017   CKD (chronic kidney disease), stage III ([place]) 03/01/2017   Abdominal wall cellulitis 03/01/2017   Adrenal insufficiency ([place]) 03/01/2017   Hyperammonemia ([place])    Cellulitis  06/28/2016   Thrombocytopenia ([place]) 06/28/2016   AKI (acute kidney injury) ([place]) 06/28/2016   Multiple pulmonary nodules determined by computed tomography of lung    Chronic cough 09/07/2015   Hypertension, portal ([place]) 09/04/2011   Asthma with chronic obstructive pulmonary disease (COPD) ([place]) 09/04/2011   Vocal cord nodules 07/04/2011   GERD (gastroesophageal reflux disease) 07/04/2011   Sarcoidosis 07/01/2011   Asthma, severe persistent, with upper airway instability 06/13/2011   Congenital hepatic fibrosis 07/03/2000    PCP: Janett Billow Copland  REFERRING PROVIDER: Jean Rosenthal  REFERRING DIAG: [id number]  THERAPY DIAG:  Acute pain of left shoulder  Muscle weakness (generalized)  Cervicalgia  Stiffness of left shoulder, not elsewhere classified  Abnormal posture  Rationale for Evaluation and Treatment Rehabilitation  ONSET DATE: 12/05/21  SUBJECTIVE:  SUBJECTIVE STATEMENT: I was very sore the next morning  PERTINENT HISTORY: Hepaptic Fibrosis, liver transplant 55yr ago Chronic kidney disease  Sarcoidosis Double spinal fusion C4-5 Osteoporosis  Fall on 12/05/21  PAIN:  Are you having pain? Yes: NPRS scale: 3/10 Pain location: L shoulder blade, rhomboid, upper trap  Pain description: dull, achy Aggravating factors: sleeping on L side, overhead, across body, gardening pain can be up to 5/10  Relieving factors: tylenol massage, no using it  PRECAUTIONS: Fall  WEIGHT BEARING RESTRICTIONS No  FALLS:  Has patient fallen in last 6 months? Yes. Number of falls 1  LIVING ENVIRONMENT: Lives with: lives with their spouse Lives in: House/apartment Stairs: Yes: Internal: 14 steps; on right going up Has following equipment at home: WEnvironmental consultant- 2 wheeled  OCCUPATION: Retired    PLOF: Independent  PATIENT GOALS to move again without pain   OBJECTIVE:   DIAGNOSTIC FINDINGS:  X-ray of L shoulder findings: No acute or structural abnormalities  PATIENT SURVEYS:  Quick Dash 36.4  COGNITION:  Overall cognitive status: Within functional limits for tasks assessed     SENSATION: WFL  POSTURE: Forward head, flexed trunk, rounded shoulders   UPPER EXTREMITY ROM:   Active ROM LEFT RIGHT Left AROM 04/16/22  Shoulder flexion End range pain WFL   Shoulder extension     Shoulder abduction Pain >90d WFL   Shoulder adduction     Shoulder internal rotation 20d w/pain  40  Shoulder external rotation 80 w/pain    Elbow flexion WNL    Elbow extension WNL    Wrist flexion     Wrist extension     Wrist ulnar deviation     Wrist radial deviation     Wrist pronation     Wrist supination     (Blank rows = not tested)  UPPER EXTREMITY MMT: all MMT on L with pain  MMT LEFT RIGHT  Shoulder flexion 3+ 4  Shoulder extension    Shoulder abduction 2+ 4+  Shoulder adduction 2+ 4  Shoulder internal rotation 4   Shoulder external rotation 2+   Middle trapezius    Lower trapezius    Elbow flexion 4+ 4+  Elbow extension 4+ 4+  Wrist flexion    Wrist extension    Wrist ulnar deviation    Wrist radial deviation    Wrist pronation    Wrist supination    Grip strength (lbs)    (Blank rows = not tested)  Cervical AROM Pain w/flexion, R rotation, L lateral flexion Decreased joint mobility and cervical musculature tightness    SHOULDER SPECIAL TESTS:  Impingement tests: Neer impingement test: positive , Hawkins/Kennedy impingement test: positive , and Painful arc test: positive   Rotator cuff assessment: Empty can test: positive  and Full can test: positive     JOINT MOBILITY TESTING:  PROM to L shoulder, pain at end range flexion and ER, pain and muscle guarding >100 ABD, limited range into IR   PALPATION:  TTP anterior shoulder C5-7, trigger points in L  upper trap and rhomboids   TODAY'S TREATMENT:  04/19/22 UBE level 2 x 5 minutes Rows 10# 2x10 Lats 15# 2x10 STM and use of vibration as well as passive stretches of the upper traps, the rhomboid and the levator    04/16/22 UBE level 2 x 5 minutes Rows 10# 2x10 Lats 15# 2x10 2# [place] Bar flexion Red tband ER/ horizontal abduction Behind back small abduction DN to the left upper trap, the left rhomboid  and the left cervical area with good LTR's STM to the above   04/11/22 UBE L1 x 2 min each    AROM WaTE bar Shoulder, Flex, Ext, IR x10    ER yellow 2x10 Rows 10# 2x10 Lats 15# 2x10 Shoulder Ext green 2x10  STM to the right upper trap, neck and rhomboid, very tight and sore MHP/IFC to the right upper trap and rhomboid   04/09/22 UBE level 1 x 4 minutes, backward caused pain in the shoulder blade Rows 10# 2x10 Lats 15# 2x10 Triceps 15# 2x10 Biceps 5# 2x 10 STM to the right upper trap, neck and rhomboid, very tight and sore MHP/IFC to the right upper trap and rhomboid   PATIENT EDUCATION: Education details: POC, anatomy  Person educated: Patient Education method: Customer service manager Education comprehension: verbalized understanding and returned demonstration   HOME EXERCISE PROGRAM: [id number]  ASSESSMENT:  CLINICAL IMPRESSION: Patient with some increase of soreness today, has knots in the rhomboid and the upper trap and neck, is tender today She had a lot of times when she was elevating her shoulder and needed my cues or self cues to relax REHAB POTENTIAL: Good  CLINICAL DECISION MAKING: Stable/uncomplicated  EVALUATION COMPLEXITY: Low   GOALS: Goals reviewed with patient? Yes  SHORT TERM GOALS: Target date: 04/16/22  Patient will be independent with initial HEP.  Goal status: met    LONG TERM GOALS: Target date: 05/14/22  1.  Patient to demonstrate improved upright posture with posterior shoulder girdle engaged to promote improved glenohumeral  joint mobility. Goal status:partially met  2.  Patient to improve L shoulder and cervical AROM to [facility] without pain provocation to allow for increased ease of ADLs and reaching overhead.  Goal status: ongoing  3.  Patient will demonstrate improved functional UE strength as demonstrated by >= 4/5 in all muscle groups. Goal status: ongoing  4.  Patient will report less than or equal to 20 on QuickDASH to demonstrate improved functional ability.  Baseline: 36.4 Goal status:ongoing  5.  Patient will report 0/10 on NPRS for L shoulder to be able to complete household chores and ADLs without pain and difficulty.  Baseline: 6/10 Goal status:ongoing   PLAN: PT FREQUENCY: 1-2x/week  PT DURATION: 6 weeks  PLANNED INTERVENTIONS: Therapeutic exercises, Therapeutic activity, Neuromuscular re-education, Balance training, Gait training, Patient/Family education, Joint mobilization, Dry Needling, Spinal mobilization, Cryotherapy, Moist heat, Taping, and Manual therapy  PLAN FOR NEXT SESSION: Continue to work on her motions and pain  Mireyah Chervenak W, PT 04/19/2022, 8:47 AM

## 2022-04-23 ENCOUNTER — Encounter: Payer: Self-pay | Admitting: Physical Therapy

## 2022-04-23 ENCOUNTER — Ambulatory Visit: Payer: Medicare Other | Admitting: Physical Therapy

## 2022-04-23 DIAGNOSIS — M25512 Pain in left shoulder: Secondary | ICD-10-CM | POA: Diagnosis not present

## 2022-04-23 DIAGNOSIS — M6281 Muscle weakness (generalized): Secondary | ICD-10-CM

## 2022-04-23 DIAGNOSIS — R293 Abnormal posture: Secondary | ICD-10-CM

## 2022-04-23 DIAGNOSIS — M25612 Stiffness of left shoulder, not elsewhere classified: Secondary | ICD-10-CM

## 2022-04-23 DIAGNOSIS — M542 Cervicalgia: Secondary | ICD-10-CM | POA: Diagnosis not present

## 2022-04-23 NOTE — Therapy (Signed)
OUTPATIENT PHYSICAL THERAPY SHOULDER Treatment   Patient Name: Alicia [name] MRN: [id number] DOB:14-Apr-1961, 61 y.o., female Today's Date: 04/23/2022   PT End of Session - 04/23/22 0842     Visit Number 7    Date for PT Re-Evaluation 05/14/22    Authorization Type BC[employer] Medicare    PT Start Time [phone]    PT Stop Time 0930    PT Time Calculation (min) 48 min    Activity Tolerance Patient tolerated treatment well    Behavior During Therapy WFL for tasks assessed/performed             Past Medical History:  Diagnosis Date   Allergy    Anemia    Asthma    Cellulitis    Cirrhosis ([place])    GERD (gastroesophageal reflux disease)    Hepatic fibrosis    congenital   History of UTI    chronic as a child   Sarcoidosis    Spleen enlarged    Past Surgical History:  Procedure Laterality Date   COLONOSCOPY     CYST EXCISION Left 05/01/2017   Procedure: LEFT INDEX FINGER CYST EXCISION;  Surgeon: Leandrew Koyanagi, MD;  Location: [facility];  Service: Orthopedics;  Laterality: Left;   ESOPHAGOGASTRODUODENOSCOPY     LIVER TRANSPLANT     SPINAL FUSION  12/2007   Double spinal Fusion   VIDEO BRONCHOSCOPY Bilateral 12/08/2015   Procedure: VIDEO BRONCHOSCOPY WITH FLUORO;  Surgeon: Rigoberto Noel, MD;  Location: [facility];  Service: Cardiopulmonary;  Laterality: Bilateral;   Patient Active Problem List   Diagnosis Date Noted   Osteoporosis without current pathological fracture 05/25/2018   Status post liver transplant ([place]) 12/25/2017   Syncope 07/17/2017   Other cirrhosis of liver ([place]) 05/10/2017   Immunocompromised patient ([place]) 05/10/2017   Mucous cyst of digit of left hand 04/11/2017   Cellulitis, abdominal wall 03/01/2017   Normocytic anemia 03/01/2017   Hypokalemia 03/01/2017   CKD (chronic kidney disease), stage III ([place]) 03/01/2017   Abdominal wall cellulitis 03/01/2017   Adrenal insufficiency ([place]) 03/01/2017   Hyperammonemia ([place])    Cellulitis 06/28/2016    Thrombocytopenia ([place]) 06/28/2016   AKI (acute kidney injury) ([place]) 06/28/2016   Multiple pulmonary nodules determined by computed tomography of lung    Chronic cough 09/07/2015   Hypertension, portal ([place]) 09/04/2011   Asthma with chronic obstructive pulmonary disease (COPD) ([place]) 09/04/2011   Vocal cord nodules 07/04/2011   GERD (gastroesophageal reflux disease) 07/04/2011   Sarcoidosis 07/01/2011   Asthma, severe persistent, with upper airway instability 06/13/2011   Congenital hepatic fibrosis 07/03/2000    PCP: Janett Billow Copland  REFERRING PROVIDER: Jean Rosenthal  REFERRING DIAG: [phone]  THERAPY DIAG:  Acute pain of left shoulder  Cervicalgia  Stiffness of left shoulder, not elsewhere classified  Abnormal posture  Muscle weakness (generalized)  Rationale for Evaluation and Treatment Rehabilitation  ONSET DATE: 12/05/21  SUBJECTIVE:  SUBJECTIVE STATEMENT: Neck is some better but she can still "Feel it" in her trap ans shoulder blade  PERTINENT HISTORY: Hepaptic Fibrosis, liver transplant 91yr ago Chronic kidney disease  Sarcoidosis Double spinal fusion C4-5 Osteoporosis  Fall on 12/05/21  PAIN:  Are you having pain? Yes: NPRS scale: 0/10 Pain location: L shoulder blade, rhomboid, upper trap  Pain description: dull, achy Aggravating factors: sleeping on L side, overhead, across body, gardening pain can be up to 5/10  Relieving factors: tylenol massage, no using it  PRECAUTIONS: Fall  WEIGHT BEARING RESTRICTIONS No  FALLS:  Has patient fallen in last 6 months? Yes. Number of falls 1  LIVING ENVIRONMENT: Lives with: lives with their spouse Lives in: House/apartment Stairs: Yes: Internal: 14 steps; on right going up Has following equipment at home: WEnvironmental consultant- 2  wheeled  OCCUPATION: Retired   PLOF: Independent  PATIENT GOALS to move again without pain   OBJECTIVE:   DIAGNOSTIC FINDINGS:  X-ray of L shoulder findings: No acute or structural abnormalities  PATIENT SURVEYS:  Quick Dash 36.4  COGNITION:  Overall cognitive status: Within functional limits for tasks assessed     SENSATION: WFL  POSTURE: Forward head, flexed trunk, rounded shoulders   UPPER EXTREMITY ROM:   Active ROM LEFT RIGHT Left AROM 04/16/22  Shoulder flexion End range pain WFL   Shoulder extension     Shoulder abduction Pain >90d WFL   Shoulder adduction     Shoulder internal rotation 20d w/pain  40  Shoulder external rotation 80 w/pain    Elbow flexion WNL    Elbow extension WNL    Wrist flexion     Wrist extension     Wrist ulnar deviation     Wrist radial deviation     Wrist pronation     Wrist supination     (Blank rows = not tested)  UPPER EXTREMITY MMT: all MMT on L with pain  MMT LEFT RIGHT  Shoulder flexion 3+ 4  Shoulder extension    Shoulder abduction 2+ 4+  Shoulder adduction 2+ 4  Shoulder internal rotation 4   Shoulder external rotation 2+   Middle trapezius    Lower trapezius    Elbow flexion 4+ 4+  Elbow extension 4+ 4+  Wrist flexion    Wrist extension    Wrist ulnar deviation    Wrist radial deviation    Wrist pronation    Wrist supination    Grip strength (lbs)    (Blank rows = not tested)  Cervical AROM Pain w/flexion, R rotation, L lateral flexion Decreased joint mobility and cervical musculature tightness    SHOULDER SPECIAL TESTS:  Impingement tests: Neer impingement test: positive , Hawkins/Kennedy impingement test: positive , and Painful arc test: positive   Rotator cuff assessment: Empty can test: positive  and Full can test: positive     JOINT MOBILITY TESTING:  PROM to L shoulder, pain at end range flexion and ER, pain and muscle guarding >100 ABD, limited range into IR   PALPATION:  TTP anterior  shoulder C5-7, trigger points in L upper trap and rhomboids   TODAY'S TREATMENT:  04/23/22 UBE L 2 x 5 min  Rows 15# 2x10 Lats 15# 2x10 Shoulder Ext 5lb 2x10  Shoulder Er yellow 2x10 Horiz Abd yellow 2x10  STM as well as passive stretches of the upper traps, the rhomboid and the levator   04/19/22 UBE level 2 x 5 minutes Rows 10# 2x10 Lats 15# 2x10 STM and use  of vibration as well as passive stretches of the upper traps, the rhomboid and the levator    04/16/22 UBE level 2 x 5 minutes Rows 10# 2x10 Lats 15# 2x10 2# [place] Bar flexion Red tband ER/ horizontal abduction Behind back small abduction DN to the left upper trap, the left rhomboid and the left cervical area with good LTR's STM to the above   04/11/22 UBE L1 x 2 min each    AROM WaTE bar Shoulder, Flex, Ext, IR x10    ER yellow 2x10 Rows 10# 2x10 Lats 15# 2x10 Shoulder Ext green 2x10  STM to the right upper trap, neck and rhomboid, very tight and sore MHP/IFC to the right upper trap and rhomboid   04/09/22 UBE level 1 x 4 minutes, backward caused pain in the shoulder blade Rows 10# 2x10 Lats 15# 2x10 Triceps 15# 2x10 Biceps 5# 2x 10 STM to the right upper trap, neck and rhomboid, very tight and sore MHP/IFC to the right upper trap and rhomboid   PATIENT EDUCATION: Education details: POC, anatomy  Person educated: Patient Education method: Customer service manager Education comprehension: verbalized understanding and returned demonstration   HOME EXERCISE PROGRAM: [id number]  ASSESSMENT:  CLINICAL IMPRESSION: Pt enters reporting some improvement overall. Knots in the rhomboid and the upper trap and neck remains. Increase resistance tolerated with seated rows. Pt report that she could feel her muscles with ER and horiz abd. Positive response with MT evident by improved by increase tissue elasticity.    REHAB POTENTIAL: Good  CLINICAL DECISION MAKING: Stable/uncomplicated  EVALUATION  COMPLEXITY: Low   GOALS: Goals reviewed with patient? Yes  SHORT TERM GOALS: Target date: 04/16/22  Patient will be independent with initial HEP.  Goal status: met    LONG TERM GOALS: Target date: 05/14/22  1.  Patient to demonstrate improved upright posture with posterior shoulder girdle engaged to promote improved glenohumeral joint mobility. Goal status:partially met  2.  Patient to improve L shoulder and cervical AROM to [facility] without pain provocation to allow for increased ease of ADLs and reaching overhead.  Goal status: ongoing  3.  Patient will demonstrate improved functional UE strength as demonstrated by >= 4/5 in all muscle groups. Goal status: ongoing  4.  Patient will report less than or equal to 20 on QuickDASH to demonstrate improved functional ability.  Baseline: 36.4 Goal status:ongoing  5.  Patient will report 0/10 on NPRS for L shoulder to be able to complete household chores and ADLs without pain and difficulty.  Baseline: 6/10 Goal status:ongoing   PLAN: PT FREQUENCY: 1-2x/week  PT DURATION: 6 weeks  PLANNED INTERVENTIONS: Therapeutic exercises, Therapeutic activity, Neuromuscular re-education, Balance training, Gait training, Patient/Family education, Joint mobilization, Dry Needling, Spinal mobilization, Cryotherapy, Moist heat, Taping, and Manual therapy  PLAN FOR NEXT SESSION: Continue to work on her motions and pain  Scot Jun, PTA 04/23/2022, 8:43 AM

## 2022-04-24 MED ORDER — ARFORMOTEROL TARTRATE 15 MCG/2ML IN NEBU: 15.0000 ug | INHALATION_SOLUTION | Freq: Two times a day (BID) | RESPIRATORY_TRACT | 4 refills | Status: DC

## 2022-04-25 ENCOUNTER — Encounter: Payer: Self-pay | Admitting: Physical Therapy

## 2022-04-25 ENCOUNTER — Ambulatory Visit: Payer: Medicare Other | Admitting: Physical Therapy

## 2022-04-25 DIAGNOSIS — M25512 Pain in left shoulder: Secondary | ICD-10-CM

## 2022-04-25 DIAGNOSIS — M542 Cervicalgia: Secondary | ICD-10-CM | POA: Diagnosis not present

## 2022-04-25 DIAGNOSIS — R293 Abnormal posture: Secondary | ICD-10-CM | POA: Diagnosis not present

## 2022-04-25 DIAGNOSIS — M25612 Stiffness of left shoulder, not elsewhere classified: Secondary | ICD-10-CM

## 2022-04-25 DIAGNOSIS — M6281 Muscle weakness (generalized): Secondary | ICD-10-CM

## 2022-04-25 NOTE — Therapy (Signed)
OUTPATIENT PHYSICAL THERAPY SHOULDER TREATMENT   Patient Name: Alicia [name] MRN: [id number] DOB:[birth year], 61 y.o., female Today's Date: 04/25/2022   PT End of Session - 04/25/22 0844     Visit Number 8    Date for PT Re-Evaluation 05/14/22    Authorization Type BCBC Medicare    PT Start Time 0845    PT Stop Time 0930    PT Time Calculation (min) 45 min    Activity Tolerance Patient tolerated treatment well    Behavior During Therapy WFL for tasks assessed/performed             Past Medical History:  Diagnosis Date   Allergy    Anemia    Asthma    Cellulitis    Cirrhosis ([place])    GERD (gastroesophageal reflux disease)    Hepatic fibrosis    congenital   History of UTI    chronic as a child   Sarcoidosis    Spleen enlarged    Past Surgical History:  Procedure Laterality Date   COLONOSCOPY     CYST EXCISION Left 05/01/2017   Procedure: LEFT INDEX FINGER CYST EXCISION;  Surgeon: Leandrew Koyanagi, MD;  Location: [facility];  Service: Orthopedics;  Laterality: Left;   ESOPHAGOGASTRODUODENOSCOPY     LIVER TRANSPLANT     SPINAL FUSION  12/2007   Double spinal Fusion   VIDEO BRONCHOSCOPY Bilateral 12/08/2015   Procedure: VIDEO BRONCHOSCOPY WITH FLUORO;  Surgeon: Rigoberto Noel, MD;  Location: [facility];  Service: Cardiopulmonary;  Laterality: Bilateral;   Patient Active Problem List   Diagnosis Date Noted   Osteoporosis without current pathological fracture 05/25/2018   Status post liver transplant ([place]) 12/25/2017   Syncope 07/17/2017   Other cirrhosis of liver ([place]) 05/10/2017   Immunocompromised patient ([place]) 05/10/2017   Mucous cyst of digit of left hand 04/11/2017   Cellulitis, abdominal wall 03/01/2017   Normocytic anemia 03/01/2017   Hypokalemia 03/01/2017   CKD (chronic kidney disease), stage III ([place]) 03/01/2017   Abdominal wall cellulitis 03/01/2017   Adrenal insufficiency ([place]) 03/01/2017   Hyperammonemia ([place])    Cellulitis 06/28/2016    Thrombocytopenia ([place]) 06/28/2016   AKI (acute kidney injury) ([place]) 06/28/2016   Multiple pulmonary nodules determined by computed tomography of lung    Chronic cough 09/07/2015   Hypertension, portal ([place]) 09/04/2011   Asthma with chronic obstructive pulmonary disease (COPD) ([place]) 09/04/2011   Vocal cord nodules 07/04/2011   GERD (gastroesophageal reflux disease) 07/04/2011   Sarcoidosis 07/01/2011   Asthma, severe persistent, with upper airway instability 06/13/2011   Congenital hepatic fibrosis 07/03/2000    PCP: Janett Billow Copland  REFERRING PROVIDER: Jean Rosenthal  REFERRING DIAG: [phone]  THERAPY DIAG:  Acute pain of left shoulder  Cervicalgia  Abnormal posture  Stiffness of left shoulder, not elsewhere classified  Muscle weakness (generalized)  Rationale for Evaluation and Treatment Rehabilitation  ONSET DATE: 12/05/21  SUBJECTIVE:  SUBJECTIVE STATEMENT: "Im getting better" Feel like th pain in her neck is not as bad  PERTINENT HISTORY: Hepaptic Fibrosis, liver transplant 7yr ago Chronic kidney disease  Sarcoidosis Double spinal fusion C4-5 Osteoporosis  Fall on 12/05/21  PAIN:  Are you having pain? Yes: NPRS scale: 0/10 Pain location: L shoulder blade, rhomboid, upper trap  Pain description: Muscles feel exhausted all the time  Aggravating factors:   Relieving factors: t  PRECAUTIONS: Fall  WEIGHT BEARING RESTRICTIONS No  FALLS:  Has patient fallen in last 6 months? Yes. Number of falls 1  LIVING ENVIRONMENT: Lives with: lives with their spouse Lives in: House/apartment Stairs: Yes: Internal: 14 steps; on right going up Has following equipment at home: WEnvironmental consultant- 2 wheeled  OCCUPATION: Retired   PLOF: Independent  PATIENT GOALS to move again without pain    OBJECTIVE:   DIAGNOSTIC FINDINGS:  X-ray of L shoulder findings: No acute or structural abnormalities  PATIENT SURVEYS:  Quick Dash 36.4  COGNITION:  Overall cognitive status: Within functional limits for tasks assessed     SENSATION: WFL  POSTURE: Forward head, flexed trunk, rounded shoulders   UPPER EXTREMITY ROM:   Active ROM LEFT RIGHT Left AROM 04/16/22  Shoulder flexion End range pain WFL   Shoulder extension     Shoulder abduction Pain >90d WFL   Shoulder adduction     Shoulder internal rotation 20d w/pain  40  Shoulder external rotation 80 w/pain    Elbow flexion WNL    Elbow extension WNL    Wrist flexion     Wrist extension     Wrist ulnar deviation     Wrist radial deviation     Wrist pronation     Wrist supination     (Blank rows = not tested)  UPPER EXTREMITY MMT: all MMT on L with pain  MMT LEFT RIGHT  Shoulder flexion 3+ 4  Shoulder extension    Shoulder abduction 2+ 4+  Shoulder adduction 2+ 4  Shoulder internal rotation 4   Shoulder external rotation 2+   Middle trapezius    Lower trapezius    Elbow flexion 4+ 4+  Elbow extension 4+ 4+  Wrist flexion    Wrist extension    Wrist ulnar deviation    Wrist radial deviation    Wrist pronation    Wrist supination    Grip strength (lbs)    (Blank rows = not tested)  Cervical AROM Pain w/flexion, R rotation, L lateral flexion Decreased joint mobility and cervical musculature tightness    SHOULDER SPECIAL TESTS:  Impingement tests: Neer impingement test: positive , Hawkins/Kennedy impingement test: positive , and Painful arc test: positive   Rotator cuff assessment: Empty can test: positive  and Full can test: positive     JOINT MOBILITY TESTING:  PROM to L shoulder, pain at end range flexion and ER, pain and muscle guarding >100 ABD, limited range into IR   PALPATION:  TTP anterior shoulder C5-7, trigger points in L upper trap and rhomboids   TODAY'S TREATMENT:  04/25/22 NuStep  L3 x 4 min  UBE L2 x 3 min  Rows 15# 2x12 Lats 15# 2x12 Shoulder Ext red 2x10 Shoulder ER yellow 2x10  Horiz Abd yellow 2x10  Triceps  Ext 20lb 2x10    STM as well as passive stretches of the upper traps, the rhomboid and the levator   04/23/22 UBE L 2 x 5 min  Rows 15# 2x10 Lats 15# 2x10 Shoulder Ext 5lb  2x10  Shoulder Er yellow 2x10 Horiz Abd yellow 2x10  STM as well as passive stretches of the upper traps, the rhomboid and the levator   04/19/22 UBE level 2 x 5 minutes Rows 10# 2x10 Lats 15# 2x10 STM and use of vibration as well as passive stretches of the upper traps, the rhomboid and the levator    04/16/22 UBE level 2 x 5 minutes Rows 10# 2x10 Lats 15# 2x10 2# [place] Bar flexion Red tband ER/ horizontal abduction Behind back small abduction DN to the left upper trap, the left rhomboid and the left cervical area with good LTR's STM to the above   04/11/22 UBE L1 x 2 min each    AROM WaTE bar Shoulder, Flex, Ext, IR x10    ER yellow 2x10 Rows 10# 2x10 Lats 15# 2x10 Shoulder Ext green 2x10  STM to the right upper trap, neck and rhomboid, very tight and sore MHP/IFC to the right upper trap and rhomboid   04/09/22 UBE level 1 x 4 minutes, backward caused pain in the shoulder blade Rows 10# 2x10 Lats 15# 2x10 Triceps 15# 2x10 Biceps 5# 2x 10 STM to the right upper trap, neck and rhomboid, very tight and sore MHP/IFC to the right upper trap and rhomboid   PATIENT EDUCATION: Education details: POC, anatomy  Person educated: Patient Education method: Customer service manager Education comprehension: verbalized understanding and returned demonstration   HOME EXERCISE PROGRAM: [id number]  ASSESSMENT:  CLINICAL IMPRESSION: Pt enters reporting some improvement overall. She continues to have some tissue density in R  UT and rhomboid area. She did respond well with MT. Postural cue required with standing shoulder extensions. Increase reps tolerated with  seated rows and lats.   REHAB POTENTIAL: Good  CLINICAL DECISION MAKING: Stable/uncomplicated  EVALUATION COMPLEXITY: Low   GOALS: Goals reviewed with patient? Yes  SHORT TERM GOALS: Target date: 04/16/22  Patient will be independent with initial HEP.  Goal status: met    LONG TERM GOALS: Target date: 05/14/22  1.  Patient to demonstrate improved upright posture with posterior shoulder girdle engaged to promote improved glenohumeral joint mobility. Goal status:partially met  2.  Patient to improve L shoulder and cervical AROM to [facility] without pain provocation to allow for increased ease of ADLs and reaching overhead.  Goal status: ongoing  3.  Patient will demonstrate improved functional UE strength as demonstrated by >= 4/5 in all muscle groups. Goal status: ongoing  4.  Patient will report less than or equal to 20 on QuickDASH to demonstrate improved functional ability.  Baseline: 36.4 Goal status:ongoing  5.  Patient will report 0/10 on NPRS for L shoulder to be able to complete household chores and ADLs without pain and difficulty.  Baseline: 6/10 Goal status:ongoing   PLAN: PT FREQUENCY: 1-2x/week  PT DURATION: 6 weeks  PLANNED INTERVENTIONS: Therapeutic exercises, Therapeutic activity, Neuromuscular re-education, Balance training, Gait training, Patient/Family education, Joint mobilization, Dry Needling, Spinal mobilization, Cryotherapy, Moist heat, Taping, and Manual therapy  PLAN FOR NEXT SESSION: Continue to work on her motions and pain  Scot Jun, PTA 04/25/2022, 8:44 AM

## 2022-04-30 ENCOUNTER — Encounter: Payer: Self-pay | Admitting: Physical Therapy

## 2022-04-30 ENCOUNTER — Ambulatory Visit: Payer: Medicare Other | Attending: Orthopaedic Surgery | Admitting: Physical Therapy

## 2022-04-30 DIAGNOSIS — M6281 Muscle weakness (generalized): Secondary | ICD-10-CM | POA: Diagnosis not present

## 2022-04-30 DIAGNOSIS — R293 Abnormal posture: Secondary | ICD-10-CM | POA: Diagnosis not present

## 2022-04-30 DIAGNOSIS — M25512 Pain in left shoulder: Secondary | ICD-10-CM | POA: Diagnosis not present

## 2022-04-30 DIAGNOSIS — M25612 Stiffness of left shoulder, not elsewhere classified: Secondary | ICD-10-CM | POA: Diagnosis not present

## 2022-04-30 DIAGNOSIS — M542 Cervicalgia: Secondary | ICD-10-CM | POA: Insufficient documentation

## 2022-04-30 NOTE — Therapy (Signed)
OUTPATIENT PHYSICAL THERAPY SHOULDER TREATMENT   Patient Name: Alicia [name] MRN: [id number] DOB:1961/01/27, 61 y.o., female Today's Date: 04/30/2022   PT End of Session - 04/30/22 0759     Visit Number 9    Date for PT Re-Evaluation 05/14/22    PT Start Time 0759    PT Stop Time 0845    PT Time Calculation (min) 46 min    Activity Tolerance Patient tolerated treatment well    Behavior During Therapy [facility] for tasks assessed/performed             Past Medical History:  Diagnosis Date   Allergy    Anemia    Asthma    Cellulitis    Cirrhosis ([place])    GERD (gastroesophageal reflux disease)    Hepatic fibrosis    congenital   History of UTI    chronic as a child   Sarcoidosis    Spleen enlarged    Past Surgical History:  Procedure Laterality Date   COLONOSCOPY     CYST EXCISION Left 05/01/2017   Procedure: LEFT INDEX FINGER CYST EXCISION;  Surgeon: Leandrew Koyanagi, MD;  Location: [place];  Service: Orthopedics;  Laterality: Left;   ESOPHAGOGASTRODUODENOSCOPY     LIVER TRANSPLANT     SPINAL FUSION  12/2007   Double spinal Fusion   VIDEO BRONCHOSCOPY Bilateral 12/08/2015   Procedure: VIDEO BRONCHOSCOPY WITH FLUORO;  Surgeon: Rigoberto Noel, MD;  Location: [place];  Service: Cardiopulmonary;  Laterality: Bilateral;   Patient Active Problem List   Diagnosis Date Noted   Osteoporosis without current pathological fracture 05/25/2018   Status post liver transplant ([place]) 12/25/2017   Syncope 07/17/2017   Other cirrhosis of liver ([place]) 05/10/2017   Immunocompromised patient ([place]) 05/10/2017   Mucous cyst of digit of left hand 04/11/2017   Cellulitis, abdominal wall 03/01/2017   Normocytic anemia 03/01/2017   Hypokalemia 03/01/2017   CKD (chronic kidney disease), stage III ([place]) 03/01/2017   Abdominal wall cellulitis 03/01/2017   Adrenal insufficiency ([place]) 03/01/2017   Hyperammonemia ([place])    Cellulitis 06/28/2016   Thrombocytopenia ([place]) 06/28/2016   AKI (acute  kidney injury) ([place]) 06/28/2016   Multiple pulmonary nodules determined by computed tomography of lung    Chronic cough 09/07/2015   Hypertension, portal ([place]) 09/04/2011   Asthma with chronic obstructive pulmonary disease (COPD) ([place]) 09/04/2011   Vocal cord nodules 07/04/2011   GERD (gastroesophageal reflux disease) 07/04/2011   Sarcoidosis 07/01/2011   Asthma, severe persistent, with upper airway instability 06/13/2011   Congenital hepatic fibrosis 07/03/2000    PCP: Janett Billow Copland  REFERRING PROVIDER: Jean Rosenthal  REFERRING DIAG: [phone]  THERAPY DIAG:  Acute pain of left shoulder  Cervicalgia  Abnormal posture  Stiffness of left shoulder, not elsewhere classified  Muscle weakness (generalized)  Rationale for Evaluation and Treatment Rehabilitation  ONSET DATE: 12/05/21  SUBJECTIVE:  SUBJECTIVE STATEMENT: "We are getting better I think, I think we are about done"  PERTINENT HISTORY: Hepaptic Fibrosis, liver transplant 24yr ago Chronic kidney disease  Sarcoidosis Double spinal fusion C4-5 Osteoporosis  Fall on 12/05/21  PAIN:  Are you having pain? Yes: NPRS scale: 0/10 Pain location: L shoulder blade, rhomboid, upper trap  Pain description: Muscles feel exhausted all the time  Aggravating factors:   Relieving factors: t  PRECAUTIONS: Fall  WEIGHT BEARING RESTRICTIONS No  FALLS:  Has patient fallen in last 6 months? Yes. Number of falls 1  LIVING ENVIRONMENT: Lives with: lives with their spouse Lives in: House/apartment Stairs: Yes: Internal: 14 steps; on right going up Has following equipment at home: WEnvironmental consultant- 2 wheeled  OCCUPATION: Retired   PLOF: Independent  PATIENT GOALS to move again without pain   OBJECTIVE:   DIAGNOSTIC FINDINGS:  X-ray of L  shoulder findings: No acute or structural abnormalities  PATIENT SURVEYS:  Quick Dash 36.4   POSTURE: Forward head, flexed trunk, rounded shoulders   UPPER EXTREMITY ROM:   Active ROM LEFT RIGHT Left AROM 04/16/22 L shoulder AROM  Shoulder flexion End range pain WFL    Shoulder extension      Shoulder abduction Pain >90d WFL    Shoulder adduction      Shoulder internal rotation 20d w/pain  40 WFL  Shoulder external rotation 80 w/pain     Elbow flexion WNL     Elbow extension WNL     Wrist flexion      Wrist extension      Wrist ulnar deviation      Wrist radial deviation      Wrist pronation      Wrist supination      (Blank rows = not tested)  UPPER EXTREMITY MMT: all MMT on L with pain  MMT LEFT RIGHT  Shoulder flexion 3+ 4  Shoulder extension    Shoulder abduction 2+ 4+  Shoulder adduction 2+ 4  Shoulder internal rotation 4   Shoulder external rotation 2+   Middle trapezius    Lower trapezius    Elbow flexion 4+ 4+  Elbow extension 4+ 4+  Wrist flexion    Wrist extension    Wrist ulnar deviation    Wrist radial deviation    Wrist pronation    Wrist supination    Grip strength (lbs)    (Blank rows = not tested)  Cervical AROM Pain w/flexion, R rotation, L lateral flexion Decreased joint mobility and cervical musculature tightness    SHOULDER SPECIAL TESTS:  Impingement tests: Neer impingement test: positive , Hawkins/Kennedy impingement test: positive , and Painful arc test: positive   Rotator cuff assessment: Empty can test: positive  and Full can test: positive     JOINT MOBILITY TESTING:  PROM to L shoulder, pain at end range flexion and ER, pain and muscle guarding >100 ABD, limited range into IR   PALPATION:  TTP anterior shoulder C5-7, trigger points in L upper trap and rhomboids   TODAY'S TREATMENT:  UBE L1.5 x3 min each  Rows 15# 2x12 Lats 15# 2x12 Shoulder Ext red 2x12 Shoulder ER yellow 2x10 Seated OHP yellow ball 2x10 Shoulder flex  2lb 2x10 Shoulder ABd 1lb 2x10 LUE ER abd to 90 yellow 2x10 Triceps Ext 25 2x10 STM to the right upper trap, neck and rhomboid,  04/25/22 NuStep L3 x 4 min  UBE L2 x 3 min  Rows 15# 2x12 Lats 15# 2x12 Shoulder Ext red 2x10  Shoulder ER yellow 2x10  Horiz Abd yellow 2x10  Triceps  Ext 20lb 2x10    STM as well as passive stretches of the upper traps, the rhomboid and the levator   04/23/22 UBE L 2 x 5 min  Rows 15# 2x10 Lats 15# 2x10 Shoulder Ext 5lb 2x10  Shoulder Er yellow 2x10 Horiz Abd yellow 2x10  STM as well as passive stretches of the upper traps, the rhomboid and the levator   04/19/22 UBE level 2 x 5 minutes Rows 10# 2x10 Lats 15# 2x10 STM and use of vibration as well as passive stretches of the upper traps, the rhomboid and the levator    04/16/22 UBE level 2 x 5 minutes Rows 10# 2x10 Lats 15# 2x10 2# [place] flexion Red tband ER/ horizontal abduction Behind back small abduction DN to the left upper trap, the left rhomboid and the left cervical area with good LTR's STM to the above   04/11/22 UBE L1 x 2 min each    AROM WaTE bar Shoulder, Flex, Ext, IR x10    ER yellow 2x10 Rows 10# 2x10 Lats 15# 2x10 Shoulder Ext green 2x10  STM to the right upper trap, neck and rhomboid, very tight and sore MHP/IFC to the right upper trap and rhomboid  PATIENT EDUCATION: Education details: POC, anatomy  Person educated: Patient Education method: Customer service manager Education comprehension: verbalized understanding and returned demonstration   HOME EXERCISE PROGRAM: [id number]  ASSESSMENT:  CLINICAL IMPRESSION: Pt continues to improvement overall. Increase issue elasticity noted in the upper L trap. She hs progressed increasing her LUE AROM. Pt tolerated a progressed session that focused on LUE and postural strength. TP noted I the L rhomboid area.   REHAB POTENTIAL: Good  CLINICAL DECISION MAKING: Stable/uncomplicated  EVALUATION COMPLEXITY:  Low   GOALS: Goals reviewed with patient? Yes  SHORT TERM GOALS: Target date: 04/16/22  Patient will be independent with initial HEP.  Goal status: met    LONG TERM GOALS: Target date: 05/14/22  1.  Patient to demonstrate improved upright posture with posterior shoulder girdle engaged to promote improved glenohumeral joint mobility. Goal status:partially met  2.  Patient to improve L shoulder and cervical AROM to [facility] without pain provocation to allow for increased ease of ADLs and reaching overhead.  Goal status: met  3.  Patient will demonstrate improved functional UE strength as demonstrated by >= 4/5 in all muscle groups. Goal status: ongoing  4.  Patient will report less than or equal to 20 on QuickDASH to demonstrate improved functional ability.  Baseline: 36.4 Goal status:ongoing  5.  Patient will report 0/10 on NPRS for L shoulder to be able to complete household chores and ADLs without pain and difficulty.  Baseline: 6/10 Goal status:ongoing   PLAN: PT FREQUENCY: 1-2x/week  PT DURATION: 6 weeks  PLANNED INTERVENTIONS: Therapeutic exercises, Therapeutic activity, Neuromuscular re-education, Balance training, Gait training, Patient/Family education, Joint mobilization, Dry Needling, Spinal mobilization, Cryotherapy, Moist heat, Taping, and Manual therapy  PLAN FOR NEXT SESSION: Continue to work on her motions and pain  Scot Jun, PTA 04/30/2022, 7:59 AM

## 2022-05-02 ENCOUNTER — Ambulatory Visit: Payer: Medicare Other | Admitting: Physical Therapy

## 2022-05-02 ENCOUNTER — Encounter: Payer: Self-pay | Admitting: Physical Therapy

## 2022-05-02 DIAGNOSIS — R293 Abnormal posture: Secondary | ICD-10-CM

## 2022-05-02 DIAGNOSIS — M25612 Stiffness of left shoulder, not elsewhere classified: Secondary | ICD-10-CM | POA: Diagnosis not present

## 2022-05-02 DIAGNOSIS — M6281 Muscle weakness (generalized): Secondary | ICD-10-CM | POA: Diagnosis not present

## 2022-05-02 DIAGNOSIS — M542 Cervicalgia: Secondary | ICD-10-CM

## 2022-05-02 DIAGNOSIS — M25512 Pain in left shoulder: Secondary | ICD-10-CM

## 2022-05-02 NOTE — Therapy (Signed)
OUTPATIENT PHYSICAL THERAPY SHOULDER TREATMENT Progress Note Reporting Period 03/26/22 to 05/02/22  See note below for Objective Data and Assessment of Progress/Goals.      Patient Name: Alicia [name] MRN: [id number] DOB:1961/04/17, 61 y.o., female Today's Date: 05/02/2022   PT End of Session - 05/02/22 0845     Visit Number 10    Date for PT Re-Evaluation 05/14/22    PT Start Time 0845    PT Stop Time 0930    PT Time Calculation (min) 45 min    Activity Tolerance Patient tolerated treatment well    Behavior During Therapy [facility] for tasks assessed/performed             Past Medical History:  Diagnosis Date   Allergy    Anemia    Asthma    Cellulitis    Cirrhosis ([place])    GERD (gastroesophageal reflux disease)    Hepatic fibrosis    congenital   History of UTI    chronic as a child   Sarcoidosis    Spleen enlarged    Past Surgical History:  Procedure Laterality Date   COLONOSCOPY     CYST EXCISION Left 05/01/2017   Procedure: LEFT INDEX FINGER CYST EXCISION;  Surgeon: Leandrew Koyanagi, MD;  Location: [place];  Service: Orthopedics;  Laterality: Left;   ESOPHAGOGASTRODUODENOSCOPY     LIVER TRANSPLANT     SPINAL FUSION  12/2007   Double spinal Fusion   VIDEO BRONCHOSCOPY Bilateral 12/08/2015   Procedure: VIDEO BRONCHOSCOPY WITH FLUORO;  Surgeon: Rigoberto Noel, MD;  Location: [place];  Service: Cardiopulmonary;  Laterality: Bilateral;   Patient Active Problem List   Diagnosis Date Noted   Osteoporosis without current pathological fracture 05/25/2018   Status post liver transplant ([place]) 12/25/2017   Syncope 07/17/2017   Other cirrhosis of liver ([place]) 05/10/2017   Immunocompromised patient ([place]) 05/10/2017   Mucous cyst of digit of left hand 04/11/2017   Cellulitis, abdominal wall 03/01/2017   Normocytic anemia 03/01/2017   Hypokalemia 03/01/2017   CKD (chronic kidney disease), stage III ([place]) 03/01/2017   Abdominal wall cellulitis 03/01/2017   Adrenal  insufficiency ([place]) 03/01/2017   Hyperammonemia ([place])    Cellulitis 06/28/2016   Thrombocytopenia ([place]) 06/28/2016   AKI (acute kidney injury) ([place]) 06/28/2016   Multiple pulmonary nodules determined by computed tomography of lung    Chronic cough 09/07/2015   Hypertension, portal ([place]) 09/04/2011   Asthma with chronic obstructive pulmonary disease (COPD) ([place]) 09/04/2011   Vocal cord nodules 07/04/2011   GERD (gastroesophageal reflux disease) 07/04/2011   Sarcoidosis 07/01/2011   Asthma, severe persistent, with upper airway instability 06/13/2011   Congenital hepatic fibrosis 07/03/2000    PCP: Janett Billow Copland  REFERRING PROVIDER: Jean Rosenthal  REFERRING DIAG: [phone]  THERAPY DIAG:  Acute pain of left shoulder  Cervicalgia  Abnormal posture  Stiffness of left shoulder, not elsewhere classified  Muscle weakness (generalized)  Rationale for Evaluation and Treatment Rehabilitation  ONSET DATE: 12/05/21  SUBJECTIVE:  SUBJECTIVE STATEMENT: "Donald, suppose to go to yoga later"  PERTINENT HISTORY: Hepaptic Fibrosis, liver transplant 29yr ago Chronic kidney disease  Sarcoidosis Double spinal fusion C4-5 Osteoporosis  Fall on 12/05/21  PAIN:  Are you having pain? Yes: NPRS scale: 0/10 Pain location: L shoulder blade, rhomboid, upper trap  Pain description: Muscles feel exhausted all the time  Aggravating factors:   Relieving factors: t  PRECAUTIONS: Fall  WEIGHT BEARING RESTRICTIONS No  FALLS:  Has patient fallen in last 6 months? Yes. Number of falls 1  LIVING ENVIRONMENT: Lives with: lives with their spouse Lives in: House/apartment Stairs: Yes: Internal: 14 steps; on right going up Has following equipment at home: WEnvironmental consultant- 2 wheeled  OCCUPATION: Retired   PLOF:  Independent  PATIENT GOALS to move again without pain   OBJECTIVE:   DIAGNOSTIC FINDINGS:  X-ray of L shoulder findings: No acute or structural abnormalities  PATIENT SURVEYS:  Quick Dash 36.4   POSTURE: Forward head, flexed trunk, rounded shoulders   UPPER EXTREMITY ROM:   Active ROM LEFT RIGHT Left AROM 04/16/22 L shoulder AROM  Shoulder flexion End range pain WFL    Shoulder extension      Shoulder abduction Pain >90d WFL    Shoulder adduction      Shoulder internal rotation 20d w/pain  40 WFL  Shoulder external rotation 80 w/pain     Elbow flexion WNL     Elbow extension WNL     Wrist flexion      Wrist extension      Wrist ulnar deviation      Wrist radial deviation      Wrist pronation      Wrist supination      (Blank rows = not tested)  UPPER EXTREMITY MMT: all MMT on L with pain  MMT LEFT RIGHT  Shoulder flexion 3+ 4  Shoulder extension    Shoulder abduction 2+ 4+  Shoulder adduction 2+ 4  Shoulder internal rotation 4   Shoulder external rotation 2+   Middle trapezius    Lower trapezius    Elbow flexion 4+ 4+  Elbow extension 4+ 4+  Wrist flexion    Wrist extension    Wrist ulnar deviation    Wrist radial deviation    Wrist pronation    Wrist supination    Grip strength (lbs)    (Blank rows = not tested)  Cervical AROM Pain w/flexion, R rotation, L lateral flexion Decreased joint mobility and cervical musculature tightness    SHOULDER SPECIAL TESTS:  Impingement tests: Neer impingement test: positive , Hawkins/Kennedy impingement test: positive , and Painful arc test: positive   Rotator cuff assessment: Empty can test: positive  and Full can test: positive     JOINT MOBILITY TESTING:  PROM to L shoulder, pain at end range flexion and ER, pain and muscle guarding >100 ABD, limited range into IR   PALPATION:  TTP anterior shoulder C5-7, trigger points in L upper trap and rhomboids   TODAY'S TREATMENT:  UBE L1.5 x3 min  Rows 15#  2x15 Lats 15# 2x15 Shoulder Ext red 2x12 Shoulder ER red 2x10  Seated OHP yellow ball 2x10  Triceps ext 20lb 2x10  Chin tucks x10 some reports of pain   UBE L1.5 x3 min each  Rows 15# 2x12 Lats 15# 2x12 Shoulder Ext red 2x12 Shoulder ER yellow 2x10 Seated OHP yellow ball 2x10 Shoulder flex 2lb 2x10 Shoulder ABd 1lb 2x10 LUE ER abd to 90 yellow 2x10 Triceps Ext  25 2x10 STM to the right upper trap, neck and rhomboid,  04/25/22 NuStep L3 x 4 min  UBE L2 x 3 min  Rows 15# 2x12 Lats 15# 2x12 Shoulder Ext red 2x10 Shoulder ER yellow 2x10  Horiz Abd yellow 2x10  Triceps  Ext 20lb 2x10    STM as well as passive stretches of the upper traps, the rhomboid and the levator   04/23/22 UBE L 2 x 5 min  Rows 15# 2x10 Lats 15# 2x10 Shoulder Ext 5lb 2x10  Shoulder Er yellow 2x10 Horiz Abd yellow 2x10  STM as well as passive stretches of the upper traps, the rhomboid and the levator   04/19/22 UBE level 2 x 5 minutes Rows 10# 2x10 Lats 15# 2x10 STM and use of vibration as well as passive stretches of the upper traps, the rhomboid and the levator    04/16/22 UBE level 2 x 5 minutes Rows 10# 2x10 Lats 15# 2x10 2# [place] Bar flexion Red tband ER/ horizontal abduction Behind back small abduction DN to the left upper trap, the left rhomboid and the left cervical area with good LTR's STM to the above   PATIENT EDUCATION: Education details: POC, anatomy  Person educated: Patient Education method: Customer service manager Education comprehension: verbalized understanding and returned demonstration   HOME EXERCISE PROGRAM: [id number]  ASSESSMENT:  CLINICAL IMPRESSION: Pt continues to improvement overall and has progressed towards goals. Increase reps tolerated with seated rows and lats. Increase resistance tolerated with ER. Some neck pain reported with chin tucks on L side. Lead PT assisted in treatment providing pt with Dry needling.   REHAB POTENTIAL: Good  CLINICAL  DECISION MAKING: Stable/uncomplicated  EVALUATION COMPLEXITY: Low   GOALS: Goals reviewed with patient? Yes  SHORT TERM GOALS: Target date: 04/16/22  Patient will be independent with initial HEP.  Goal status: met    LONG TERM GOALS: Target date: 05/14/22  1.  Patient to demonstrate improved upright posture with posterior shoulder girdle engaged to promote improved glenohumeral joint mobility. Goal status:partially met  2.  Patient to improve L shoulder and cervical AROM to [facility] without pain provocation to allow for increased ease of ADLs and reaching overhead.  Goal status: met  3.  Patient will demonstrate improved functional UE strength as demonstrated by >= 4/5 in all muscle groups. Goal status: progressing  4.  Patient will report less than or equal to 20 on QuickDASH to demonstrate improved functional ability.  Baseline: 36.4 Goal status:ongoing  5.  Patient will report 0/10 on NPRS for L shoulder to be able to complete household chores and ADLs without pain and difficulty.  Baseline: 6/10 Goal status:met   PLAN: PT FREQUENCY: 1-2x/week  PT DURATION: 6 weeks  PLANNED INTERVENTIONS: Therapeutic exercises, Therapeutic activity, Neuromuscular re-education, Balance training, Gait training, Patient/Family education, Joint mobilization, Dry Needling, Spinal mobilization, Cryotherapy, Moist heat, Taping, and Manual therapy  PLAN FOR NEXT SESSION: Continue to work on her motions and pain  Scot Jun, PTA 05/02/2022, 8:46 AM

## 2022-05-07 ENCOUNTER — Encounter: Payer: Self-pay | Admitting: Physical Therapy

## 2022-05-07 ENCOUNTER — Ambulatory Visit: Payer: Medicare Other | Admitting: Physical Therapy

## 2022-05-07 DIAGNOSIS — M6281 Muscle weakness (generalized): Secondary | ICD-10-CM

## 2022-05-07 DIAGNOSIS — M25612 Stiffness of left shoulder, not elsewhere classified: Secondary | ICD-10-CM

## 2022-05-07 DIAGNOSIS — M542 Cervicalgia: Secondary | ICD-10-CM | POA: Diagnosis not present

## 2022-05-07 DIAGNOSIS — M25512 Pain in left shoulder: Secondary | ICD-10-CM | POA: Diagnosis not present

## 2022-05-07 DIAGNOSIS — R293 Abnormal posture: Secondary | ICD-10-CM

## 2022-05-07 NOTE — Therapy (Signed)
OUTPATIENT PHYSICAL THERAPY SHOULDER TREATMENT Progress Note Reporting Period 03/26/22 to 05/02/22  See note below for Objective Data and Assessment of Progress/Goals.      Patient Name: Alicia [name] MRN: [id number] DOB:06-03-1961, 61 y.o., female Today's Date: 05/07/2022   PT End of Session - 05/07/22 0849     Visit Number 11    Date for PT Re-Evaluation 05/14/22    PT Start Time 0845    PT Stop Time 0930    PT Time Calculation (min) 45 min    Activity Tolerance Patient tolerated treatment well    Behavior During Therapy [facility] for tasks assessed/performed             Past Medical History:  Diagnosis Date   Allergy    Anemia    Asthma    Cellulitis    Cirrhosis ([place])    GERD (gastroesophageal reflux disease)    Hepatic fibrosis    congenital   History of UTI    chronic as a child   Sarcoidosis    Spleen enlarged    Past Surgical History:  Procedure Laterality Date   COLONOSCOPY     CYST EXCISION Left 05/01/2017   Procedure: LEFT INDEX FINGER CYST EXCISION;  Surgeon: Leandrew Koyanagi, MD;  Location: [place];  Service: Orthopedics;  Laterality: Left;   ESOPHAGOGASTRODUODENOSCOPY     LIVER TRANSPLANT     SPINAL FUSION  12/2007   Double spinal Fusion   VIDEO BRONCHOSCOPY Bilateral 12/08/2015   Procedure: VIDEO BRONCHOSCOPY WITH FLUORO;  Surgeon: Rigoberto Noel, MD;  Location: [place];  Service: Cardiopulmonary;  Laterality: Bilateral;   Patient Active Problem List   Diagnosis Date Noted   Osteoporosis without current pathological fracture 05/25/2018   Status post liver transplant ([place]) 12/25/2017   Syncope 07/17/2017   Other cirrhosis of liver ([place]) 05/10/2017   Immunocompromised patient ([place]) 05/10/2017   Mucous cyst of digit of left hand 04/11/2017   Cellulitis, abdominal wall 03/01/2017   Normocytic anemia 03/01/2017   Hypokalemia 03/01/2017   CKD (chronic kidney disease), stage III ([place]) 03/01/2017   Abdominal wall cellulitis 03/01/2017   Adrenal  insufficiency ([place]) 03/01/2017   Hyperammonemia ([place])    Cellulitis 06/28/2016   Thrombocytopenia ([place]) 06/28/2016   AKI (acute kidney injury) ([place]) 06/28/2016   Multiple pulmonary nodules determined by computed tomography of lung    Chronic cough 09/07/2015   Hypertension, portal ([place]) 09/04/2011   Asthma with chronic obstructive pulmonary disease (COPD) ([place]) 09/04/2011   Vocal cord nodules 07/04/2011   GERD (gastroesophageal reflux disease) 07/04/2011   Sarcoidosis 07/01/2011   Asthma, severe persistent, with upper airway instability 06/13/2011   Congenital hepatic fibrosis 07/03/2000    PCP: Janett Billow Copland  REFERRING PROVIDER: Jean Rosenthal  REFERRING DIAG: [phone]  THERAPY DIAG:  Acute pain of left shoulder  Cervicalgia  Abnormal posture  Stiffness of left shoulder, not elsewhere classified  Muscle weakness (generalized)  Rationale for Evaluation and Treatment Rehabilitation  ONSET DATE: 12/05/21  SUBJECTIVE:  SUBJECTIVE STATEMENT: "Im pretty good,no pain"  PERTINENT HISTORY: Hepaptic Fibrosis, liver transplant 50yr ago Chronic kidney disease  Sarcoidosis Double spinal fusion C4-5 Osteoporosis  Fall on 12/05/21  PAIN:  Are you having pain? Yes: NPRS scale: 0/10 Pain location: L shoulder blade, rhomboid, upper trap  Pain description: Muscles feel exhausted all the time  Aggravating factors:   Relieving factors: t  PRECAUTIONS: Fall  WEIGHT BEARING RESTRICTIONS No  FALLS:  Has patient fallen in last 6 months? Yes. Number of falls 1  LIVING ENVIRONMENT: Lives with: lives with their spouse Lives in: House/apartment Stairs: Yes: Internal: 14 steps; on right going up Has following equipment at home: WEnvironmental consultant- 2 wheeled  OCCUPATION: Retired   PLOF:  Independent  PATIENT GOALS to move again without pain   OBJECTIVE:   DIAGNOSTIC FINDINGS:  X-ray of L shoulder findings: No acute or structural abnormalities  PATIENT SURVEYS:  Quick Dash 36.4   POSTURE: Forward head, flexed trunk, rounded shoulders   UPPER EXTREMITY ROM:   Active ROM LEFT RIGHT Left AROM 04/16/22 L shoulder AROM  Shoulder flexion End range pain WFL    Shoulder extension      Shoulder abduction Pain >90d WFL    Shoulder adduction      Shoulder internal rotation 20d w/pain  40 WFL  Shoulder external rotation 80 w/pain     Elbow flexion WNL     Elbow extension WNL     Wrist flexion      Wrist extension      Wrist ulnar deviation      Wrist radial deviation      Wrist pronation      Wrist supination      (Blank rows = not tested)  UPPER EXTREMITY MMT: all MMT on L with pain  MMT LEFT RIGHT  Shoulder flexion 3+ 4  Shoulder extension    Shoulder abduction 2+ 4+  Shoulder adduction 2+ 4  Shoulder internal rotation 4   Shoulder external rotation 2+   Middle trapezius    Lower trapezius    Elbow flexion 4+ 4+  Elbow extension 4+ 4+  Wrist flexion    Wrist extension    Wrist ulnar deviation    Wrist radial deviation    Wrist pronation    Wrist supination    Grip strength (lbs)    (Blank rows = not tested)  Cervical AROM Pain w/flexion, R rotation, L lateral flexion Decreased joint mobility and cervical musculature tightness    SHOULDER SPECIAL TESTS:  Impingement tests: Neer impingement test: positive , Hawkins/Kennedy impingement test: positive , and Painful arc test: positive   Rotator cuff assessment: Empty can test: positive  and Full can test: positive     JOINT MOBILITY TESTING:  PROM to L shoulder, pain at end range flexion and ER, pain and muscle guarding >100 ABD, limited range into IR   PALPATION:  TTP anterior shoulder C5-7, trigger points in L upper trap and rhomboids   TODAY'S TREATMENT:  05/07/22 UBE L2 x3 min each  Rows  15# 2x15 Lats 15# 2x15 Triceps ext 20lb 2x15 Shoulder Er red 2x12 Shoulder Ext red 2x15  Seated OHP blue ball 2x10  Shoulder flex 2lb 2x10 Shoulder ABd 1lb 2x10   UBE L1.5 x3 min  Rows 15# 2x15 Lats 15# 2x15 Shoulder Ext red 2x12 Shoulder ER red 2x10  Seated OHP yellow ball 2x10  Triceps ext 20lb 2x10  Chin tucks x10 some reports of pain   UBE L1.5 x3  min each  Rows 15# 2x12 Lats 15# 2x12 Shoulder Ext red 2x12 Shoulder ER yellow 2x10 Seated OHP yellow ball 2x10 Shoulder flex 2lb 2x10 Shoulder ABd 1lb 2x10 LUE ER abd to 90 yellow 2x10 Triceps Ext 25 2x10 STM to the right upper trap, neck and rhomboid,  04/25/22 NuStep L3 x 4 min  UBE L2 x 3 min  Rows 15# 2x12 Lats 15# 2x12 Shoulder Ext red 2x10 Shoulder ER yellow 2x10  Horiz Abd yellow 2x10  Triceps  Ext 20lb 2x10    STM as well as passive stretches of the upper traps, the rhomboid and the levator   04/23/22 UBE L 2 x 5 min  Rows 15# 2x10 Lats 15# 2x10 Shoulder Ext 5lb 2x10  Shoulder Er yellow 2x10 Horiz Abd yellow 2x10  STM as well as passive stretches of the upper traps, the rhomboid and the levator   04/19/22 UBE level 2 x 5 minutes Rows 10# 2x10 Lats 15# 2x10 STM and use of vibration as well as passive stretches of the upper traps, the rhomboid and the levator  PATIENT EDUCATION: Education details: POC, anatomy  Person educated: Patient Education method: Customer service manager Education comprehension: verbalized understanding and returned demonstration   HOME EXERCISE PROGRAM: [id number]  ASSESSMENT:  CLINICAL IMPRESSION: Pt continues to improvement overall and has progressed towards goals. She enters reporting no pain. No issues tolerated today's session evident by no subjective reports of increase pain. Some UE fatigue reported with flexion and abductions. Increase weight tolerated with OHP.     REHAB POTENTIAL: Good  CLINICAL DECISION MAKING: Stable/uncomplicated  EVALUATION  COMPLEXITY: Low   GOALS: Goals reviewed with patient? Yes  SHORT TERM GOALS: Target date: 04/16/22  Patient will be independent with initial HEP.  Goal status: met    LONG TERM GOALS: Target date: 05/14/22  1.  Patient to demonstrate improved upright posture with posterior shoulder girdle engaged to promote improved glenohumeral joint mobility. Goal status:partially met  2.  Patient to improve L shoulder and cervical AROM to [facility] without pain provocation to allow for increased ease of ADLs and reaching overhead.  Goal status: met  3.  Patient will demonstrate improved functional UE strength as demonstrated by >= 4/5 in all muscle groups. Goal status: progressing  4.  Patient will report less than or equal to 20 on QuickDASH to demonstrate improved functional ability.  Baseline: 36.4 Goal status:ongoing  5.  Patient will report 0/10 on NPRS for L shoulder to be able to complete household chores and ADLs without pain and difficulty.  Baseline: 6/10 Goal status:met   PLAN: PT FREQUENCY: 1-2x/week  PT DURATION: 6 weeks  PLANNED INTERVENTIONS: Therapeutic exercises, Therapeutic activity, Neuromuscular re-education, Balance training, Gait training, Patient/Family education, Joint mobilization, Dry Needling, Spinal mobilization, Cryotherapy, Moist heat, Taping, and Manual therapy  PLAN FOR NEXT SESSION: Continue to work on her motions and pain  Scot Jun, PTA 05/07/2022, 8:50 AM

## 2022-05-09 ENCOUNTER — Encounter: Payer: Self-pay | Admitting: Physical Therapy

## 2022-05-09 ENCOUNTER — Ambulatory Visit: Payer: Medicare Other | Admitting: Physical Therapy

## 2022-05-09 DIAGNOSIS — M542 Cervicalgia: Secondary | ICD-10-CM | POA: Diagnosis not present

## 2022-05-09 DIAGNOSIS — R293 Abnormal posture: Secondary | ICD-10-CM | POA: Diagnosis not present

## 2022-05-09 DIAGNOSIS — M25512 Pain in left shoulder: Secondary | ICD-10-CM | POA: Diagnosis not present

## 2022-05-09 DIAGNOSIS — M25612 Stiffness of left shoulder, not elsewhere classified: Secondary | ICD-10-CM

## 2022-05-09 DIAGNOSIS — M6281 Muscle weakness (generalized): Secondary | ICD-10-CM | POA: Diagnosis not present

## 2022-05-09 NOTE — Therapy (Signed)
OUTPATIENT PHYSICAL THERAPY SHOULDER TREATMENT     Patient Name: Alicia [name] MRN: [id number] DOB:[birth year], 61 y.o., female Today's Date: 05/09/2022   PT End of Session - 05/09/22 0846     Visit Number 12    Date for PT Re-Evaluation 05/14/22    PT Start Time 0845    PT Stop Time 0930    PT Time Calculation (min) 45 min    Activity Tolerance Patient tolerated treatment well    Behavior During Therapy [facility] for tasks assessed/performed             Past Medical History:  Diagnosis Date   Allergy    Anemia    Asthma    Cellulitis    Cirrhosis ([place])    GERD (gastroesophageal reflux disease)    Hepatic fibrosis    congenital   History of UTI    chronic as a child   Sarcoidosis    Spleen enlarged    Past Surgical History:  Procedure Laterality Date   COLONOSCOPY     CYST EXCISION Left 05/01/2017   Procedure: LEFT INDEX FINGER CYST EXCISION;  Surgeon: Leandrew Koyanagi, MD;  Location: [place];  Service: Orthopedics;  Laterality: Left;   ESOPHAGOGASTRODUODENOSCOPY     LIVER TRANSPLANT     SPINAL FUSION  12/2007   Double spinal Fusion   VIDEO BRONCHOSCOPY Bilateral 12/08/2015   Procedure: VIDEO BRONCHOSCOPY WITH FLUORO;  Surgeon: Rigoberto Noel, MD;  Location: [place];  Service: Cardiopulmonary;  Laterality: Bilateral;   Patient Active Problem List   Diagnosis Date Noted   Osteoporosis without current pathological fracture 05/25/2018   Status post liver transplant ([place]) 12/25/2017   Syncope 07/17/2017   Other cirrhosis of liver ([place]) 05/10/2017   Immunocompromised patient ([place]) 05/10/2017   Mucous cyst of digit of left hand 04/11/2017   Cellulitis, abdominal wall 03/01/2017   Normocytic anemia 03/01/2017   Hypokalemia 03/01/2017   CKD (chronic kidney disease), stage III ([place]) 03/01/2017   Abdominal wall cellulitis 03/01/2017   Adrenal insufficiency ([place]) 03/01/2017   Hyperammonemia ([place])    Cellulitis 06/28/2016   Thrombocytopenia ([place]) 06/28/2016   AKI (acute  kidney injury) ([place]) 06/28/2016   Multiple pulmonary nodules determined by computed tomography of lung    Chronic cough 09/07/2015   Hypertension, portal ([place]) 09/04/2011   Asthma with chronic obstructive pulmonary disease (COPD) ([place]) 09/04/2011   Vocal cord nodules 07/04/2011   GERD (gastroesophageal reflux disease) 07/04/2011   Sarcoidosis 07/01/2011   Asthma, severe persistent, with upper airway instability 06/13/2011   Congenital hepatic fibrosis 07/03/2000    PCP: Janett Billow Copland  REFERRING PROVIDER: Jean Rosenthal  REFERRING DIAG: [phone]  THERAPY DIAG:  Cervicalgia  Acute pain of left shoulder  Abnormal posture  Stiffness of left shoulder, not elsewhere classified  Muscle weakness (generalized)  Rationale for Evaluation and Treatment Rehabilitation  ONSET DATE: 12/05/21  SUBJECTIVE:  SUBJECTIVE STATEMENT: "Today is my last day"  PERTINENT HISTORY: Hepaptic Fibrosis, liver transplant 44yr ago Chronic kidney disease  Sarcoidosis Double spinal fusion C4-5 Osteoporosis  Fall on 12/05/21  PAIN:  Are you having pain? Yes: NPRS scale: 0/10 Pain location: L shoulder blade, rhomboid, upper trap  Pain description: Muscles feel exhausted all the time  Aggravating factors:   Relieving factors: t  PRECAUTIONS: Fall  WEIGHT BEARING RESTRICTIONS No  FALLS:  Has patient fallen in last 6 months? Yes. Number of falls 1  LIVING ENVIRONMENT: Lives with: lives with their spouse Lives in: House/apartment Stairs: Yes: Internal: 14 steps; on right going up Has following equipment at home: WEnvironmental consultant- 2 wheeled  OCCUPATION: Retired   PLOF: Independent  PATIENT GOALS to move again without pain   OBJECTIVE:   DIAGNOSTIC FINDINGS:  X-ray of L shoulder findings: No acute or  structural abnormalities  PATIENT SURVEYS:  Quick Dash 36.4   POSTURE: Forward head, flexed trunk, rounded shoulders   UPPER EXTREMITY ROM:   Active ROM LEFT RIGHT Left AROM 04/16/22 L shoulder AROM  Shoulder flexion End range pain WFL    Shoulder extension      Shoulder abduction Pain >90d WFL    Shoulder adduction      Shoulder internal rotation 20d w/pain  40 WFL  Shoulder external rotation 80 w/pain     Elbow flexion WNL     Elbow extension WNL     Wrist flexion      Wrist extension      Wrist ulnar deviation      Wrist radial deviation      Wrist pronation      Wrist supination      (Blank rows = not tested)  UPPER EXTREMITY MMT: all MMT on L with pain  MMT LEFT RIGHT Left  Shoulder flexion 3+ 4 4  Shoulder extension     Shoulder abduction 2+ 4+ 4  Shoulder adduction 2+ 4 4  Shoulder internal rotation 4    Shoulder external rotation 2+  4  Middle trapezius     Lower trapezius     Elbow flexion 4+ 4+   Elbow extension 4+ 4+   Wrist flexion     Wrist extension     Wrist ulnar deviation     Wrist radial deviation     Wrist pronation     Wrist supination     Grip strength (lbs)     (Blank rows = not tested)  Cervical AROM Pain w/flexion, R rotation, L lateral flexion Decreased joint mobility and cervical musculature tightness    SHOULDER SPECIAL TESTS:  Impingement tests: Neer impingement test: positive , Hawkins/Kennedy impingement test: positive , and Painful arc test: positive   Rotator cuff assessment: Empty can test: positive  and Full can test: positive     JOINT MOBILITY TESTING:  PROM to L shoulder, pain at end range flexion and ER, pain and muscle guarding >100 ABD, limited range into IR   PALPATION:  TTP anterior shoulder C5-7, trigger points in L upper trap and rhomboids   TODAY'S TREATMENT:  05/09/22 UBE L1.5 x 3 min each Triceps Ext 20lb 3x15 Shoulder Ext red 2x15   Rows 15# 2x15 Lats 15# 2x15 Shoulder ER red 2x10 Seated OHP blue  ball 2x10 Shoulder flex 2lb 2x10 Shoulder ABd 1lb 2x10  05/07/22 UBE L2 x3 min each  Rows 15# 2x15 Lats 15# 2x15 Triceps ext 20lb 2x15 Shoulder Er red 2x12 Shoulder Ext red 2x15  Seated OHP blue ball 2x10  Shoulder flex 2lb 2x10 Shoulder ABd 1lb 2x10   UBE L1.5 x3 min  Rows 15# 2x15 Lats 15# 2x15 Shoulder Ext red 2x12 Shoulder ER red 2x10  Seated OHP yellow ball 2x10  Triceps ext 20lb 2x10  Chin tucks x10 some reports of pain   UBE L1.5 x3 min each  Rows 15# 2x12 Lats 15# 2x12 Shoulder Ext red 2x12 Shoulder ER yellow 2x10 Seated OHP yellow ball 2x10 Shoulder flex 2lb 2x10 Shoulder ABd 1lb 2x10 LUE ER abd to 90 yellow 2x10 Triceps Ext 25 2x10 STM to the right upper trap, neck and rhomboid,  04/25/22 NuStep L3 x 4 min  UBE L2 x 3 min  Rows 15# 2x12 Lats 15# 2x12 Shoulder Ext red 2x10 Shoulder ER yellow 2x10  Horiz Abd yellow 2x10  Triceps  Ext 20lb 2x10    STM as well as passive stretches of the upper traps, the rhomboid and the levator   04/23/22 UBE L 2 x 5 min  Rows 15# 2x10 Lats 15# 2x10 Shoulder Ext 5lb 2x10  Shoulder Er yellow 2x10 Horiz Abd yellow 2x10  STM as well as passive stretches of the upper traps, the rhomboid and the levator  PATIENT EDUCATION: Education details: POC, anatomy  Person educated: Patient Education method: Customer service manager Education comprehension: verbalized understanding and returned demonstration   HOME EXERCISE PROGRAM: [id number]  ASSESSMENT:  CLINICAL IMPRESSION: Pt has progressed meeting most of her LTGs and is pleased with her current functional status. She completed all interventions with good form and ROM. She reports decrease her pain and reports no functional limitations.     REHAB POTENTIAL: Good  CLINICAL DECISION MAKING: Stable/uncomplicated  EVALUATION COMPLEXITY: Low   GOALS: Goals reviewed with patient? Yes  SHORT TERM GOALS: Target date: 04/16/22  Patient will be independent with  initial HEP.  Goal status: met    LONG TERM GOALS: Target date: 05/14/22  1.  Patient to demonstrate improved upright posture with posterior shoulder girdle engaged to promote improved glenohumeral joint mobility. Goal status: met  2.  Patient to improve L shoulder and cervical AROM to [facility] without pain provocation to allow for increased ease of ADLs and reaching overhead.  Goal status: met  3.  Patient will demonstrate improved functional UE strength as demonstrated by >= 4/5 in all muscle groups. Goal status: met  4.  Patient will report less than or equal to 20 on QuickDASH to demonstrate improved functional ability.  Baseline: 36.4 Goal status:ongoing  5.  Patient will report 0/10 on NPRS for L shoulder to be able to complete household chores and ADLs without pain and difficulty.  Baseline: 6/10 Goal status:met   PLAN: PT FREQUENCY: 1-2x/week  PT DURATION: 6 weeks  PLANNED INTERVENTIONS: Therapeutic exercises, Therapeutic activity, Neuromuscular re-education, Balance training, Gait training, Patient/Family education, Joint mobilization, Dry Needling, Spinal mobilization, Cryotherapy, Moist heat, Taping, and Manual therapy  PLAN FOR NEXT SESSION: D/C  PHYSICAL THERAPY DISCHARGE SUMMARY  Visits from Start of Care: 12  Patient agrees to discharge. Patient goals were met. Patient is being discharged due to meeting the stated rehab goals.   Scot Jun, PTA 05/09/2022, 8:47 AM

## 2022-05-16 ENCOUNTER — Ambulatory Visit (HOSPITAL_BASED_OUTPATIENT_CLINIC_OR_DEPARTMENT_OTHER)
Admission: RE | Admit: 2022-05-16 | Discharge: 2022-05-16 | Disposition: A | Discharge status: Home / Self Care | Payer: Medicare Other | Source: Ambulatory Visit | Attending: Family Medicine | Admitting: Family Medicine

## 2022-05-16 DIAGNOSIS — J449 Chronic obstructive pulmonary disease, unspecified: Secondary | ICD-10-CM | POA: Insufficient documentation

## 2022-05-16 DIAGNOSIS — I8500 Esophageal varices without bleeding: Secondary | ICD-10-CM | POA: Diagnosis not present

## 2022-05-16 DIAGNOSIS — D869 Sarcoidosis, unspecified: Secondary | ICD-10-CM | POA: Diagnosis not present

## 2022-05-16 DIAGNOSIS — I3139 Other pericardial effusion (noninflammatory): Secondary | ICD-10-CM | POA: Diagnosis not present

## 2022-05-16 DIAGNOSIS — R053 Chronic cough: Secondary | ICD-10-CM | POA: Diagnosis not present

## 2022-05-28 DIAGNOSIS — D225 Melanocytic nevi of trunk: Secondary | ICD-10-CM | POA: Diagnosis not present

## 2022-05-28 DIAGNOSIS — D1801 Hemangioma of skin and subcutaneous tissue: Secondary | ICD-10-CM | POA: Diagnosis not present

## 2022-05-28 DIAGNOSIS — D485 Neoplasm of uncertain behavior of skin: Secondary | ICD-10-CM | POA: Diagnosis not present

## 2022-05-28 DIAGNOSIS — D3615 Benign neoplasm of peripheral nerves and autonomic nervous system of abdomen: Secondary | ICD-10-CM | POA: Diagnosis not present

## 2022-05-28 DIAGNOSIS — D2262 Melanocytic nevi of left upper limb, including shoulder: Secondary | ICD-10-CM | POA: Diagnosis not present

## 2022-05-28 DIAGNOSIS — L821 Other seborrheic keratosis: Secondary | ICD-10-CM | POA: Diagnosis not present

## 2022-05-28 DIAGNOSIS — D3617 Benign neoplasm of peripheral nerves and autonomic nervous system of trunk, unspecified: Secondary | ICD-10-CM | POA: Diagnosis not present

## 2022-05-28 DIAGNOSIS — Z86018 Personal history of other benign neoplasm: Secondary | ICD-10-CM | POA: Diagnosis not present

## 2022-06-07 DIAGNOSIS — E2740 Unspecified adrenocortical insufficiency: Secondary | ICD-10-CM | POA: Diagnosis not present

## 2022-06-07 DIAGNOSIS — R7989 Other specified abnormal findings of blood chemistry: Secondary | ICD-10-CM | POA: Diagnosis not present

## 2022-06-17 DIAGNOSIS — D849 Immunodeficiency, unspecified: Secondary | ICD-10-CM | POA: Diagnosis not present

## 2022-06-17 DIAGNOSIS — Z944 Liver transplant status: Secondary | ICD-10-CM | POA: Diagnosis not present

## 2022-06-28 ENCOUNTER — Other Ambulatory Visit: Payer: Self-pay | Admitting: Pulmonary Disease

## 2022-06-28 DIAGNOSIS — J4550 Severe persistent asthma, uncomplicated: Secondary | ICD-10-CM

## 2022-07-30 ENCOUNTER — Other Ambulatory Visit (HOSPITAL_BASED_OUTPATIENT_CLINIC_OR_DEPARTMENT_OTHER): Payer: Medicare Other

## 2022-08-01 ENCOUNTER — Encounter: Payer: Self-pay | Admitting: Family Medicine

## 2022-08-01 DIAGNOSIS — Z944 Liver transplant status: Secondary | ICD-10-CM

## 2022-08-05 ENCOUNTER — Encounter: Payer: Self-pay | Admitting: Family Medicine

## 2022-08-05 ENCOUNTER — Other Ambulatory Visit (INDEPENDENT_AMBULATORY_CARE_PROVIDER_SITE_OTHER): Payer: Medicare Other

## 2022-08-05 DIAGNOSIS — Z944 Liver transplant status: Secondary | ICD-10-CM

## 2022-08-05 LAB — CBC WITH DIFFERENTIAL/PLATELET
Basophils Absolute: 0 10*3/uL (ref 0.0–0.1)
Basophils Relative: 0.7 % (ref 0.0–3.0)
Eosinophils Absolute: 0 10*3/uL (ref 0.0–0.7)
Eosinophils Relative: 1.1 % (ref 0.0–5.0)
HCT: 32.6 % — ABNORMAL LOW (ref 36.0–46.0)
Hemoglobin: 11.4 g/dL — ABNORMAL LOW (ref 12.0–15.0)
Lymphocytes Relative: 27.6 % (ref 12.0–46.0)
Lymphs Abs: 1.1 10*3/uL (ref 0.7–4.0)
MCHC: 34.9 g/dL (ref 30.0–36.0)
MCV: 89.1 fl (ref 78.0–100.0)
Monocytes Absolute: 0.4 10*3/uL (ref 0.1–1.0)
Monocytes Relative: 9.4 % (ref 3.0–12.0)
Neutro Abs: 2.4 10*3/uL (ref 1.4–7.7)
Neutrophils Relative %: 61.2 % (ref 43.0–77.0)
Platelets: 158 10*3/uL (ref 150.0–400.0)
RBC: 3.66 Mil/uL — ABNORMAL LOW (ref 3.87–5.11)
RDW: 13 % (ref 11.5–15.5)
WBC: 3.9 10*3/uL — ABNORMAL LOW (ref 4.0–10.5)

## 2022-08-05 LAB — COMPREHENSIVE METABOLIC PANEL
ALT: 7 U/L (ref 0–35)
AST: 14 U/L (ref 0–37)
Albumin: 4.3 g/dL (ref 3.5–5.2)
Alkaline Phosphatase: 47 U/L (ref 39–117)
BUN: 30 mg/dL — ABNORMAL HIGH (ref 6–23)
CO2: 23 mEq/L (ref 19–32)
Calcium: 9 mg/dL (ref 8.4–10.5)
Chloride: 106 mEq/L (ref 96–112)
Creatinine, Ser: 2.12 mg/dL — ABNORMAL HIGH (ref 0.40–1.20)
GFR: 24.3 mL/min — ABNORMAL LOW (ref >60.00)
Glucose, Bld: 102 mg/dL — ABNORMAL HIGH (ref 70–99)
Potassium: 5 mEq/L (ref 3.5–5.1)
Sodium: 138 mEq/L (ref 135–145)
Total Bilirubin: 0.5 mg/dL (ref 0.2–1.2)
Total Protein: 6.3 g/dL (ref 6.0–8.3)

## 2022-08-05 LAB — CORTISOL: Cortisol, Plasma: 10.7 ug/dL

## 2022-08-06 ENCOUNTER — Ambulatory Visit (HOSPITAL_BASED_OUTPATIENT_CLINIC_OR_DEPARTMENT_OTHER)
Admission: RE | Admit: 2022-08-06 | Discharge: 2022-08-06 | Disposition: A | Discharge status: Home / Self Care | Payer: Medicare Other | Source: Ambulatory Visit | Attending: Family Medicine | Admitting: Family Medicine

## 2022-08-06 ENCOUNTER — Encounter: Payer: Self-pay | Admitting: Family Medicine

## 2022-08-06 DIAGNOSIS — M810 Age-related osteoporosis without current pathological fracture: Secondary | ICD-10-CM | POA: Diagnosis not present

## 2022-08-06 DIAGNOSIS — Z7952 Long term (current) use of systemic steroids: Secondary | ICD-10-CM | POA: Diagnosis not present

## 2022-08-06 DIAGNOSIS — Z780 Asymptomatic menopausal state: Secondary | ICD-10-CM | POA: Insufficient documentation

## 2022-08-06 DIAGNOSIS — Z13820 Encounter for screening for osteoporosis: Secondary | ICD-10-CM | POA: Diagnosis not present

## 2022-08-07 LAB — ACTH: C206 ACTH: 55 pg/mL — ABNORMAL HIGH (ref 6–50)

## 2022-08-07 LAB — TACROLIMUS,HIGHLY SENSITIVE,LC/MS/MS: Tacrolimus Lvl: 4.4 mcg/L — ABNORMAL LOW

## 2022-08-08 ENCOUNTER — Encounter: Payer: Self-pay | Admitting: Family Medicine

## 2022-08-15 DIAGNOSIS — R7989 Other specified abnormal findings of blood chemistry: Secondary | ICD-10-CM | POA: Diagnosis not present

## 2022-08-20 ENCOUNTER — Ambulatory Visit (INDEPENDENT_AMBULATORY_CARE_PROVIDER_SITE_OTHER): Payer: Medicare Other | Admitting: *Deleted

## 2022-08-20 DIAGNOSIS — Z0000 Encounter for general adult medical examination without abnormal findings: Secondary | ICD-10-CM | POA: Diagnosis not present

## 2022-08-20 NOTE — Patient Instructions (Signed)
[name] , Thank you for taking time to come for your Medicare Wellness Visit. I appreciate your ongoing commitment to your health goals. Please review the following plan we discussed and let me know if I can assist you in the future.   These are the goals we discussed:  Goals      Patient Stated     Maintain current health & increase walking        This is a list of the screening recommended for you and due dates:  Health Maintenance  Topic Date Due   Zoster (Shingles) Vaccine (1 of 2) Never done   COVID-19 Vaccine (3 - Pfizer risk series) 07/26/2020   Cologuard (Stool DNA test)  04/20/2022   Flu Shot  04/30/2022   Medicare Annual Wellness Visit  08/21/2023   Mammogram  02/28/2024   Pap Smear  04/30/2024   Hepatitis C Screening: USPSTF Recommendation to screen - Ages 18-79 yo.  Completed   HIV Screening  Completed   HPV Vaccine  Aged Out   Colon Cancer Screening  Discontinued      Next appointment: Follow up in one year for your annual wellness visit.   Preventive Care 40-64 Years, Female Preventive care refers to lifestyle choices and visits with your health care provider that can promote health and wellness. What does preventive care include? A yearly physical exam. This is also called an annual well check. Dental exams once or twice a year. Routine eye exams. Ask your health care provider how often you should have your eyes checked. Personal lifestyle choices, including: Daily care of your teeth and gums. Regular physical activity. Eating a healthy diet. Avoiding tobacco and drug use. Limiting alcohol use. Practicing safe sex. Taking low-dose aspirin daily starting at age 55. Taking vitamin and mineral supplements as recommended by your health care provider. What happens during an annual well check? The services and screenings done by your health care provider during your annual well check will depend on your age, overall health, lifestyle risk factors, and  family history of disease. Counseling  Your health care provider may ask you questions about your: Alcohol use. Tobacco use. Drug use. Emotional well-being. Home and relationship well-being. Sexual activity. Eating habits. Work and work Statistician. Method of birth control. Menstrual cycle. Pregnancy history. Screening  You may have the following tests or measurements: Height, weight, and BMI. Blood pressure. Lipid and cholesterol levels. These may be checked every 5 years, or more frequently if you are over 48 years old. Skin check. Lung cancer screening. You may have this screening every year starting at age 60 if you have a 30-pack-year history of smoking and currently smoke or have quit within the past 15 years. Fecal occult blood test (FOBT) of the stool. You may have this test every year starting at age 37. Flexible sigmoidoscopy or colonoscopy. You may have a sigmoidoscopy every 5 years or a colonoscopy every 10 years starting at age 40. Hepatitis C blood test. Hepatitis B blood test. Sexually transmitted disease (STD) testing. Diabetes screening. This is done by checking your blood sugar (glucose) after you have not eaten for a while (fasting). You may have this done every 1-3 years. Mammogram. This may be done every 1-2 years. Talk to your health care provider about when you should start having regular mammograms. This may depend on whether you have a family history of breast cancer. BRCA-related cancer screening. This may be done if you have a family history of breast, ovarian,  tubal, or peritoneal cancers. Pelvic exam and Pap test. This may be done every 3 years starting at age 66. Starting at age 46, this may be done every 5 years if you have a Pap test in combination with an HPV test. Bone density scan. This is done to screen for osteoporosis. You may have this scan if you are at high risk for osteoporosis. Discuss your test results, treatment options, and if necessary,  the need for more tests with your health care provider. Vaccines  Your health care provider may recommend certain vaccines, such as: Influenza vaccine. This is recommended every year. Tetanus, diphtheria, and acellular pertussis (Tdap, Td) vaccine. You may need a Td booster every 10 years. Zoster vaccine. You may need this after age 63. Pneumococcal 13-valent conjugate (PCV13) vaccine. You may need this if you have certain conditions and were not previously vaccinated. Pneumococcal polysaccharide (PPSV23) vaccine. You may need one or two doses if you smoke cigarettes or if you have certain conditions. Talk to your health care provider about which screenings and vaccines you need and how often you need them. This information is not intended to replace advice given to you by your health care provider. Make sure you discuss any questions you have with your health care provider. Document Released: 10/13/2015 Document Revised: 06/05/2016 Document Reviewed: 07/18/2015 Elsevier Interactive Patient Education  2017 [place] Prevention in the Home Falls can cause injuries. They can happen to people of all ages. There are many things you can do to make your home safe and to help prevent falls. What can I do on the outside of my home? Regularly fix the edges of walkways and driveways and fix any cracks. Remove anything that might make you trip as you walk through a door, such as a raised step or threshold. Trim any bushes or trees on the path to your home. Use bright outdoor lighting. Clear any walking paths of anything that might make someone trip, such as rocks or tools. Regularly check to see if handrails are loose or broken. Make sure that both sides of any steps have handrails. Any raised decks and porches should have guardrails on the edges. Have any leaves, snow, or ice cleared regularly. Use sand or salt on walking paths during winter. Clean up any spills in your garage right  away. This includes oil or grease spills. What can I do in the bathroom? Use night lights. Install grab bars by the toilet and in the tub and shower. Do not use towel bars as grab bars. Use non-skid mats or decals in the tub or shower. If you need to sit down in the shower, use a plastic, non-slip stool. Keep the floor dry. Clean up any water that spills on the floor as soon as it happens. Remove soap buildup in the tub or shower regularly. Attach bath mats securely with double-sided non-slip rug tape. Do not have throw rugs and other things on the floor that can make you trip. What can I do in the bedroom? Use night lights. Make sure that you have a light by your bed that is easy to reach. Do not use any sheets or blankets that are too big for your bed. They should not hang down onto the floor. Have a firm chair that has side arms. You can use this for support while you get dressed. Do not have throw rugs and other things on the floor that can make you trip. What can I  do in the kitchen? Clean up any spills right away. Avoid walking on wet floors. Keep items that you use a lot in easy-to-reach places. If you need to reach something above you, use a strong step stool that has a grab bar. Keep electrical cords out of the way. Do not use floor polish or wax that makes floors slippery. If you must use wax, use non-skid floor wax. Do not have throw rugs and other things on the floor that can make you trip. What can I do with my stairs? Do not leave any items on the stairs. Make sure that there are handrails on both sides of the stairs and use them. Fix handrails that are broken or loose. Make sure that handrails are as long as the stairways. Check any carpeting to make sure that it is firmly attached to the stairs. Fix any carpet that is loose or worn. Avoid having throw rugs at the top or bottom of the stairs. If you do have throw rugs, attach them to the floor with carpet tape. Make sure  that you have a light switch at the top of the stairs and the bottom of the stairs. If you do not have them, ask someone to add them for you. What else can I do to help prevent falls? Wear shoes that: Do not have high heels. Have rubber bottoms. Are comfortable and fit you well. Are closed at the toe. Do not wear sandals. If you use a stepladder: Make sure that it is fully opened. Do not climb a closed stepladder. Make sure that both sides of the stepladder are locked into place. Ask someone to hold it for you, if possible. Clearly mark and make sure that you can see: Any grab bars or handrails. First and last steps. Where the edge of each step is. Use tools that help you move around (mobility aids) if they are needed. These include: Canes. Walkers. Scooters. Crutches. Turn on the lights when you go into a dark area. Replace any light bulbs as soon as they burn out. Set up your furniture so you have a clear path. Avoid moving your furniture around. If any of your floors are uneven, fix them. If there are any pets around you, be aware of where they are. Review your medicines with your doctor. Some medicines can make you feel dizzy. This can increase your chance of falling. Ask your doctor what other things that you can do to help prevent falls. This information is not intended to replace advice given to you by your health care provider. Make sure you discuss any questions you have with your health care provider. Document Released: 07/13/2009 Document Revised: 02/22/2016 Document Reviewed: 10/21/2014 Elsevier Interactive Patient Education  2017 [employer].

## 2022-08-20 NOTE — Progress Notes (Signed)
Subjective:   Alicia [name] is a 61 y.o. female who presents for Medicare Annual (Subsequent) preventive examination.  I connected with  Alicia [name] on 08/20/22 by a audio enabled telemedicine application and verified that I am speaking with the correct person using two identifiers.  Patient Location: Home  Provider Location: Office/Clinic  I discussed the limitations of evaluation and management by telemedicine. The patient expressed understanding and agreed to proceed.   Review of Systems    Defer to PCP       Objective:    There were no vitals filed for this visit. There is no height or weight on file to calculate BMI.     08/20/2022    9:05 AM 03/26/2022   10:18 AM 12/06/2021   10:17 AM 12/05/2021   10:52 AM 08/16/2021    9:06 AM 08/15/2018    3:33 PM 12/09/2017   10:47 PM  Advanced Directives  Does Patient Have a Medical Advance Directive? Yes Yes No No Yes No No  Type of Paramedic of [place];Living will    [place];Living will    Does patient want to make changes to medical advance directive? No - Patient declined No - Patient declined       Copy of [place] in Chart? Yes - validated most recent copy scanned in chart (See row information)    Yes - validated most recent copy scanned in chart (See row information)    Would patient like information on creating a medical advance directive?   No - Patient declined No - Patient declined  No - Patient declined No - Patient declined    Current Medications (verified) Outpatient Encounter Medications as of 08/20/2022  Medication Sig   arformoterol (BROVANA) 15 MCG/2ML NEBU Take 2 mLs (15 mcg total) by nebulization 2 (two) times daily.   azelastine (ASTELIN) 0.1 % nasal spray Place 1 spray into both nostrils 2 (two) times daily. Use in each nostril as directed   calcium citrate-vitamin D (CITRACAL+D) 315-200 MG-UNIT tablet Take 1 tablet by mouth 2 (two) times  daily.   dipyridamole (PERSANTINE) 50 MG tablet Take 50 mg by mouth daily.   ipratropium-albuterol (DUONEB) 0.5-2.5 (3) MG/3ML SOLN Inhale one vial via nebulizer every 6 hours as needed   losartan (COZAAR) 25 MG tablet Take 25 mg by mouth daily.   magnesium oxide (MAG-OX) 400 MG tablet Take 400 mg by mouth 2 (two) times daily.   pantoprazole (PROTONIX) 40 MG tablet Take 40 mg by mouth daily.   tacrolimus (PROGRAF) 1 MG capsule Take 1 mg by mouth 2 (two) times a day. TAKE 2 TABLETS ('3MG'$  TOTAL) IN THE MORNING AND TWO  TABLETS ('2MG'$  TOTAL) IN THE EVENING.   UNABLE TO FIND 25 mg. Med Name: Verapamil 25 mg daily   verapamil (CALAN-SR) 120 MG CR tablet Take 120 mg by mouth daily.   [DISCONTINUED] arformoterol (BROVANA) 15 MCG/2ML NEBU Inhale into the lungs 2 (two) times daily.   [DISCONTINUED] budesonide (PULMICORT) 0.25 MG/2ML nebulizer solution Inhale 0.25 mg into the lungs 2 (two) times daily.   [DISCONTINUED] budesonide (PULMICORT) 0.25 MG/2ML nebulizer solution Take 2 mLs (0.25 mg total) by nebulization 2 (two) times daily.   [DISCONTINUED] hydrocortisone (CORTEF) 10 MG tablet Take by mouth. 15 mg in morning, 5 mg at bedtime   [DISCONTINUED] revefenacin (YUPELRI) 175 MCG/3ML nebulizer solution Take 3 mLs (175 mcg total) by nebulization daily.   [DISCONTINUED] revefenacin (YUPELRI) 175 MCG/3ML nebulizer solution  Take 3 mLs (175 mcg total) by nebulization daily.   [DISCONTINUED] revefenacin (YUPELRI) 175 MCG/3ML nebulizer solution Take 3 mLs (175 mcg total) by nebulization daily.   No facility-administered encounter medications on file as of 08/20/2022.    Allergies (verified) Aspirin, Peanut-containing drug products, Gabapentin, Statins, and Azithromycin   History: Past Medical History:  Diagnosis Date   Allergy    Anemia    Asthma    Cellulitis    Cirrhosis (HCC)    GERD (gastroesophageal reflux disease)    Hepatic fibrosis    congenital   History of UTI    chronic as a child    Sarcoidosis    Spleen enlarged    Past Surgical History:  Procedure Laterality Date   COLONOSCOPY     CYST EXCISION Left 05/01/2017   Procedure: LEFT INDEX FINGER CYST EXCISION;  Surgeon: Leandrew Koyanagi, MD;  Location: [facility];  Service: Orthopedics;  Laterality: Left;   ESOPHAGOGASTRODUODENOSCOPY     LIVER TRANSPLANT     SPINAL FUSION  12/2007   Double spinal Fusion   VIDEO BRONCHOSCOPY Bilateral 12/08/2015   Procedure: VIDEO BRONCHOSCOPY WITH FLUORO;  Surgeon: Rigoberto Noel, MD;  Location: [facility];  Service: Cardiopulmonary;  Laterality: Bilateral;   Family History  Problem Relation Age of Onset   Arthritis Mother    Heart disease Father        Triple bypass   Migraines Father    Heart attack Paternal Grandmother    Stroke Paternal Grandfather    Liver disease Sister        Juvenile cysitc liver disease   Asthma Sister    Social History   Socioeconomic History   Marital status: Married    Spouse name: Not on file   Number of children: Not on file   Years of education: Not on file   Highest education level: Not on file  Occupational History   Occupation: HR Manager    Employer: [employer]  Tobacco Use   Smoking status: Never   Smokeless tobacco: Never  Substance and Sexual Activity   Alcohol use: No   Drug use: No   Sexual activity: Not on file  Other Topics Concern   Not on file  Social History Narrative   Not on file   Social Determinants of Health   Financial Resource Strain: Low Risk  (08/16/2021)   Overall Financial Resource Strain (CARDIA)    Difficulty of Paying Living Expenses: Not hard at all  Food Insecurity: No Food Insecurity (08/20/2022)   Hunger Vital Sign    Worried About Running Out of Food in the Last Year: Never true    [place] in the Last Year: Never true  Transportation Needs: No Transportation Needs (08/20/2022)   PRAPARE - Hydrologist (Medical): No    Lack of Transportation (Non-Medical): No   Physical Activity: Insufficiently Active (08/16/2021)   Exercise Vital Sign    Days of Exercise per Week: 3 days    Minutes of Exercise per Session: 30 min  Stress: No Stress Concern Present (08/16/2021)   [place]    Feeling of Stress : Not at all  Social Connections: [place] (08/16/2021)   Social Connection and Isolation Panel [NHANES]    Frequency of Communication with Friends and Family: More than three times a week    Frequency of Social Gatherings with Friends and Family: More than three times  a week    Attends Religious Services: 1 to 4 times per year    Active Member of Clubs or Organizations: Yes    Attends Music therapist: More than 4 times per year    Marital Status: Married    Tobacco Counseling Counseling given: Not Answered   Clinical Intake:  Pre-visit preparation completed: Yes  Pain : No/denies pain  Diabetes: No  How often do you need to have someone help you when you read instructions, pamphlets, or other written materials from your doctor or pharmacy?: 1 - Never   Activities of Daily Living    08/20/2022    9:36 AM  In your present state of health, do you have any difficulty performing the following activities:  Hearing? 0  Vision? 0  Difficulty concentrating or making decisions? 0  Walking or climbing stairs? 0  Dressing or bathing? 0  Doing errands, shopping? 0  Preparing Food and eating ? N  Using the Toilet? N  In the past six months, have you accidently leaked urine? N  Do you have problems with loss of bowel control? N  Managing your Medications? N  Managing your Finances? N  Housekeeping or managing your Housekeeping? N    Patient Care Team: Copland, Gay Filler, MD as PCP - General (Family Medicine) Olegario Messier, MD as Referring Physician (Internal Medicine)  Indicate any recent Medical Services you may have received from other than [facility]  providers in the past year (date may be approximate).     Assessment:   This is a routine wellness examination for [place].  Hearing/Vision screen No results found.  Dietary issues and exercise activities discussed: Current Exercise Habits: Home exercise routine;Structured exercise class, Type of exercise: strength training/weights;Other - see comments (yoga, elliptical), Time (Minutes): 60, Frequency (Times/Week): 4, Weekly Exercise (Minutes/Week): 240, Intensity: Mild, Exercise limited by: None identified   Goals Addressed   None    Depression Screen    08/20/2022    9:29 AM 08/16/2021    9:10 AM 05/22/2017   11:51 AM  PHQ 2/9 Scores  PHQ - 2 Score 0 0 0    Fall Risk    08/20/2022    9:30 AM 08/16/2021    9:07 AM 05/22/2017   11:51 AM  Fall Risk   Falls in the past year? 1 0 No  Number falls in past yr: 0 0   Injury with Fall? 1 0   Risk for fall due to : History of fall(s)    Follow up Falls evaluation completed Falls prevention discussed     FALL RISK PREVENTION PERTAINING TO THE HOME:  Any stairs in or around the home? Yes  If so, are there any without handrails? No  Home free of loose throw rugs in walkways, pet beds, electrical cords, etc? Yes  Adequate lighting in your home to reduce risk of falls? Yes   ASSISTIVE DEVICES UTILIZED TO PREVENT FALLS:  Life alert? No  Use of a cane, walker or w/c? No  Grab bars in the bathroom? No  Shower chair or bench in shower? Yes  Elevated toilet seat or a handicapped toilet?  Comfort height  TIMED UP AND GO:  Was the test performed?  No, audio visit .    Cognitive Function:        08/20/2022    9:44 AM  6CIT Screen  What Year? 0 points  What month? 0 points  What time? 0 points  Count back from 20 0  points  Months in reverse 0 points  Repeat phrase 0 points  Total Score 0 points    Immunizations Immunization History  Administered Date(s) Administered   Hepatitis A, Adult 02/05/2017, 07/08/2017    Hepatitis B, adult 07/08/2017, 09/15/2017   Influenza Inj Mdck Quad Pf 07/15/2019   Influenza Split 07/01/2018   Influenza, Quadrivalent, Recombinant, Inj, Pf 07/11/2016, 07/15/2017, 07/01/2018   Influenza-Unspecified 07/11/2016, 07/15/2017, 07/15/2019   PFIZER Comirnaty(Gray Top)Covid-19 Tri-Sucrose Vaccine 06/07/2020, 06/28/2020   Pneumococcal Conjugate-13 09/04/2012, 09/08/2014   Pneumococcal Polysaccharide-23 09/15/2017   Tdap 01/21/2009, 06/27/2016    TDAP status: Up to date  Flu Vaccine status: Due, Education has been provided regarding the importance of this vaccine. Advised may receive this vaccine at local pharmacy or Health Dept. Aware to provide a copy of the vaccination record if obtained from local pharmacy or Health Dept. Verbalized acceptance and understanding.  Pneumococcal vaccine status: Up to date  Covid-19 vaccine status: Information provided on how to obtain vaccines.   Qualifies for Shingles Vaccine? Yes   Zostavax completed No   Shingrix Completed?: No.    Education has been provided regarding the importance of this vaccine. Patient has been advised to call insurance company to determine out of pocket expense if they have not yet received this vaccine. Advised may also receive vaccine at local pharmacy or Health Dept. Verbalized acceptance and understanding.  Screening Tests Health Maintenance  Topic Date Due   Zoster Vaccines- Shingrix (1 of 2) Never done   COVID-19 Vaccine (3 - Pfizer risk series) 07/26/2020   Fecal DNA (Cologuard)  04/20/2022   INFLUENZA VACCINE  04/30/2022   Medicare Annual Wellness (AWV)  08/16/2022   MAMMOGRAM  02/28/2024   PAP SMEAR-Modifier  04/30/2024   Hepatitis C Screening  Completed   HIV Screening  Completed   HPV VACCINES  Aged Out   COLONOSCOPY (Pts 45-73yr Insurance coverage will need to be confirmed)  Discontinued    Health Maintenance  Health Maintenance Due  Topic Date Due   Zoster Vaccines- Shingrix (1 of 2) Never  done   COVID-19 Vaccine (3 - Pfizer risk series) 07/26/2020   Fecal DNA (Cologuard)  04/20/2022   INFLUENZA VACCINE  04/30/2022   Medicare Annual Wellness (AWV)  08/16/2022    Colorectal cancer screening: Type of screening: Cologuard. Completed 04/21/19. Repeat every 3 years  Mammogram status: Completed 02/27/22. Repeat every year  Lung Cancer Screening: (Low Dose CT Chest recommended if Age 63-80years, 30 pack-year currently smoking OR have quit w/in 15years.) does not qualify.   Additional Screening:  Hepatitis C Screening: does qualify; Completed 09/30/13  Vision Screening: Recommended annual ophthalmology exams for early detection of glaucoma and other disorders of the eye. Is the patient up to date with their annual eye exam?  Yes  Who is the provider or what is the name of the office in which the patient attends annual eye exams? [facility] If pt is not established with a provider, would they like to be referred to a provider to establish care? No .   Dental Screening: Recommended annual dental exams for proper oral hygiene  Community Resource Referral / Chronic Care Management: CRR required this visit?  No   CCM required this visit?  No      Plan:     I have personally reviewed and noted the following in the patient's chart:   Medical and social history Use of alcohol, tobacco or illicit drugs  Current medications and supplements including opioid prescriptions.  Patient is not currently taking opioid prescriptions. Functional ability and status Nutritional status Physical activity Advanced directives List of other physicians Hospitalizations, surgeries, and ER visits in previous 12 months Vitals Screenings to include cognitive, depression, and falls Referrals and appointments  In addition, I have reviewed and discussed with patient certain preventive protocols, quality metrics, and best practice recommendations. A written personalized care plan for preventive  services as well as general preventive health recommendations were provided to patient.   Due to this being a telephonic visit, the after visit summary with patients personalized plan was offered to patient via mail or my-chart. Patient would like to access on my-chart.  Beatris Ship, [place]   08/20/2022   Nurse Notes: None

## 2022-08-25 ENCOUNTER — Other Ambulatory Visit: Payer: Self-pay | Admitting: Pulmonary Disease

## 2022-08-30 ENCOUNTER — Encounter: Payer: Self-pay | Admitting: Family Medicine

## 2022-08-30 DIAGNOSIS — Z944 Liver transplant status: Secondary | ICD-10-CM

## 2022-09-11 ENCOUNTER — Other Ambulatory Visit (INDEPENDENT_AMBULATORY_CARE_PROVIDER_SITE_OTHER): Payer: Medicare Other

## 2022-09-11 DIAGNOSIS — Z944 Liver transplant status: Secondary | ICD-10-CM | POA: Diagnosis not present

## 2022-09-11 LAB — CBC WITH DIFFERENTIAL/PLATELET
Basophils Absolute: 0 10*3/uL (ref 0.0–0.1)
Basophils Relative: 0.4 % (ref 0.0–3.0)
Eosinophils Absolute: 0.1 10*3/uL (ref 0.0–0.7)
Eosinophils Relative: 1.3 % (ref 0.0–5.0)
HCT: 33.4 % — ABNORMAL LOW (ref 36.0–46.0)
Hemoglobin: 11.6 g/dL — ABNORMAL LOW (ref 12.0–15.0)
Lymphocytes Relative: 30.2 % (ref 12.0–46.0)
Lymphs Abs: 1.3 10*3/uL (ref 0.7–4.0)
MCHC: 34.8 g/dL (ref 30.0–36.0)
MCV: 88.8 fl (ref 78.0–100.0)
Monocytes Absolute: 0.4 10*3/uL (ref 0.1–1.0)
Monocytes Relative: 9.4 % (ref 3.0–12.0)
Neutro Abs: 2.6 10*3/uL (ref 1.4–7.7)
Neutrophils Relative %: 58.7 % (ref 43.0–77.0)
Platelets: 185 10*3/uL (ref 150.0–400.0)
RBC: 3.76 Mil/uL — ABNORMAL LOW (ref 3.87–5.11)
RDW: 12.7 % (ref 11.5–15.5)
WBC: 4.4 10*3/uL (ref 4.0–10.5)

## 2022-09-11 LAB — COMPREHENSIVE METABOLIC PANEL
ALT: 5 U/L (ref 0–35)
AST: 9 U/L (ref 0–37)
Albumin: 4.2 g/dL (ref 3.5–5.2)
Alkaline Phosphatase: 47 U/L (ref 39–117)
BUN: 24 mg/dL — ABNORMAL HIGH (ref 6–23)
CO2: 26 mEq/L (ref 19–32)
Calcium: 9.3 mg/dL (ref 8.4–10.5)
Chloride: 107 mEq/L (ref 96–112)
Creatinine, Ser: 1.9 mg/dL — ABNORMAL HIGH (ref 0.40–1.20)
GFR: 27.69 mL/min — ABNORMAL LOW (ref >60.00)
Glucose, Bld: 103 mg/dL — ABNORMAL HIGH (ref 70–99)
Potassium: 4.5 mEq/L (ref 3.5–5.1)
Sodium: 141 mEq/L (ref 135–145)
Total Bilirubin: 0.4 mg/dL (ref 0.2–1.2)
Total Protein: 6.2 g/dL (ref 6.0–8.3)

## 2022-09-11 LAB — CORTISOL: Cortisol, Plasma: 9.1 ug/dL

## 2022-09-13 LAB — TACROLIMUS,HIGHLY SENSITIVE,LC/MS/MS: Tacrolimus Lvl: 4 mcg/L — ABNORMAL LOW

## 2022-09-15 ENCOUNTER — Encounter: Payer: Self-pay | Admitting: Family Medicine

## 2022-09-15 LAB — ACTH: C206 ACTH: 47 pg/mL (ref 6–50)

## 2022-09-16 ENCOUNTER — Telehealth: Payer: Self-pay

## 2022-09-16 NOTE — Telephone Encounter (Signed)
[facility] called to make sure we had received the out of range Tacrolimus lab on 09/11/22.

## 2022-09-16 NOTE — Telephone Encounter (Signed)
Yes, thank you.  We are not managing this drug, just drawing labs for [facility]

## 2022-09-20 ENCOUNTER — Encounter: Payer: Self-pay | Admitting: Pulmonary Disease

## 2022-10-02 DIAGNOSIS — R7989 Other specified abnormal findings of blood chemistry: Secondary | ICD-10-CM | POA: Diagnosis not present

## 2022-10-02 DIAGNOSIS — N184 Chronic kidney disease, stage 4 (severe): Secondary | ICD-10-CM | POA: Diagnosis not present

## 2022-10-03 DIAGNOSIS — K080 Exfoliation of teeth due to systemic causes: Secondary | ICD-10-CM | POA: Diagnosis not present

## 2022-10-28 ENCOUNTER — Telehealth (HOSPITAL_COMMUNITY): Payer: Self-pay

## 2022-10-28 ENCOUNTER — Other Ambulatory Visit (HOSPITAL_COMMUNITY): Payer: Self-pay | Admitting: *Deleted

## 2022-10-28 ENCOUNTER — Encounter: Payer: Self-pay | Admitting: Pulmonary Disease

## 2022-10-28 ENCOUNTER — Ambulatory Visit: Payer: Medicare Other | Admitting: Pulmonary Disease

## 2022-10-28 VITALS — BP 124/78 | HR 83 | Ht 62.0 in | Wt 149.0 lb

## 2022-10-28 DIAGNOSIS — R053 Chronic cough: Secondary | ICD-10-CM

## 2022-10-28 DIAGNOSIS — D869 Sarcoidosis, unspecified: Secondary | ICD-10-CM

## 2022-10-28 DIAGNOSIS — J4550 Severe persistent asthma, uncomplicated: Secondary | ICD-10-CM | POA: Diagnosis not present

## 2022-10-28 DIAGNOSIS — R1310 Dysphagia, unspecified: Secondary | ICD-10-CM

## 2022-10-28 MED ORDER — TRIAMCINOLONE ACETONIDE 55 MCG/ACT NA AERO: 2.0000 | INHALATION_SPRAY | Freq: Every day | NASAL | 12 refills | Status: DC

## 2022-10-28 MED ORDER — ALBUTEROL SULFATE HFA 108 (90 BASE) MCG/ACT IN AERS: 2.0000 | INHALATION_SPRAY | Freq: Four times a day (QID) | RESPIRATORY_TRACT | 6 refills | Status: DC | PRN

## 2022-10-28 MED ORDER — IPRATROPIUM BROMIDE 0.03 % NA SOLN: 2.0000 | Freq: Two times a day (BID) | NASAL | 12 refills | Status: DC

## 2022-10-28 NOTE — Telephone Encounter (Signed)
Attempted to contact patient to schedule Modified Barium Swallow - left voicemail. 

## 2022-10-28 NOTE — Progress Notes (Unsigned)
Synopsis: Referred in February 2023 for Sarcoidosis by Lamar Blinks, MD  Subjective:   PATIENT ID: Alicia [name] GENDER: female DOB: [birth year], MRN: [id number]  HPI  Chief Complaint  Patient presents with   Follow-up    [id number] for cough. States her cough and SOB have changed with medication. The cough tends to get worse after eating and drinking.    Alicia [name] a 62year old woman, never smoker with GERD, congenital hepatic fibrosis s/p liver transplant, CKD III, chronic cough and asthma who returns for sarcoidosis.   She was started on yupelri along with brovana and budesonide nebs after last visit with some improvement in her cough symptoms. She has been on duonebs and budesonide since as insurance does not cover yupelri.  Her cough feels worse after eating and drinking. She reports coughing and sneezing with eating and drinking. She feels sometimes food can get stuck in her chest. She reports history of food allergies in the past and questions whether those have returned.   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   Initial OV 11/02/21 [facility] MZigmund Danieland most recently by Dr. JDaneil Dolinfor her asthma and history of sarcoidosis, and wishes to establish care locally.  Records reviewed in care everywhere.  She was recently transitioned from inhaler therapy to long-acting nebulizer treatments with improvement in her breathing and coughing episodes.  She still reports having 6-8 coughing fits per day.  She also experiences intermittent wheezing.  She is currently taking budesonide and Brovana.  She has as needed albuterol inhaler which does provide relief.  She was diagnosed with sarcoidosis in 2017 via transbronchial biopsies from various segments of the right lower lobe.  CT chest scan from 11/2020  shows peribronchovascular and perifissural nodularity bilaterally with similar distribution as before with some enlargement of various nodules.  Right apical nodule is 6 mm.  Right lower lobe subpleural pulmonary nodule is 5 mm.  There is new bibasilar scarring and mild volume loss.  She tries to remain as active as possible.  She enjoys gardening in her backyard.  She is accompanied by her husband today.  Past Medical History:  Diagnosis Date   Allergy    Anemia    Asthma    Cellulitis    Cirrhosis (HCC)    GERD (gastroesophageal reflux disease)    Hepatic fibrosis    congenital   History of UTI    chronic as a child   Sarcoidosis    Spleen enlarged      Family History  Problem Relation Age of Onset   Arthritis Mother    Heart disease Father        Triple bypass   Migraines Father    Heart attack Paternal Grandmother    Stroke Paternal Grandfather    Liver disease Sister        Juvenile cysitc liver disease   Asthma Sister      Social History   Socioeconomic History   Marital status: Married    Spouse name: Not on file   Number of children: Not on file   Years of education: Not on file   Highest education level: Not on file  Occupational History   Occupation: HR Manager    Employer: [employer]  Tobacco Use   Smoking status: Never  Smokeless tobacco: Never  Substance and Sexual Activity   Alcohol use: No   Drug use: No   Sexual activity: Not on file  Other Topics Concern   Not on file  Social History Narrative   Not on file   Social Determinants of Health   Financial Resource Strain: Low Risk  (08/16/2021)   Overall Financial Resource Strain (CARDIA)    Difficulty of Paying Living Expenses: Not hard at all  Food Insecurity: No Food Insecurity (08/20/2022)   Hunger Vital Sign    Worried About Running Out of Food in the Last Year: Never true    Ran Out of Food in the Last Year: Never true  Transportation Needs: No Transportation Needs (08/20/2022)    PRAPARE - Hydrologist (Medical): No    Lack of Transportation (Non-Medical): No  Physical Activity: Insufficiently Active (08/16/2021)   Exercise Vital Sign    Days of Exercise per Week: 3 days    Minutes of Exercise per Session: 30 min  Stress: No Stress Concern Present (08/16/2021)   [place]    Feeling of Stress : Not at all  Social Connections: [place] (08/16/2021)   Social Connection and Isolation Panel [NHANES]    Frequency of Communication with Friends and Family: More than three times a week    Frequency of Social Gatherings with Friends and Family: More than three times a week    Attends Religious Services: 1 to 4 times per year    Active Member of [employer] or Organizations: Yes    Attends Music therapist: More than 4 times per year    Marital Status: Married  Human resources officer Violence: Not At Risk (08/20/2022)   Humiliation, Afraid, Rape, and Kick questionnaire    Fear of Current or Ex-Partner: No    Emotionally Abused: No    Physically Abused: No    Sexually Abused: No     Allergies  Allergen Reactions   Aspirin Other (See Comments)    Causes Asthma   Peanut-Containing Drug Products Shortness Of Breath    Asthma, eye swelling, itchy eyes, can't catch her breath   Gabapentin Other (See Comments)    Blisters in mouth, nausea    Statins     Asthma, eye swelling, itchy eyes, can't catch her breath   Azithromycin Nausea And Vomiting     Outpatient Medications Prior to Visit  Medication Sig Dispense Refill   arformoterol (BROVANA) 15 MCG/2ML NEBU Substituted for: Brovana Neb Solution Inhale one vial in nebulizer twice a day. 60 mL 6   budesonide (PULMICORT) 0.25 MG/2ML nebulizer solution Take 0.25 mg by nebulization 2 (two) times daily.     calcium citrate-vitamin D (CITRACAL+D) 315-200 MG-UNIT tablet Take 1 tablet by mouth 2 (two) times daily.      ipratropium-albuterol (DUONEB) 0.5-2.5 (3) MG/3ML SOLN Inhale one vial via nebulizer every 6 hours as needed 120 mL 11   losartan (COZAAR) 25 MG tablet Take 25 mg by mouth daily.     pantoprazole (PROTONIX) 40 MG tablet Take 40 mg by mouth daily.     tacrolimus (PROGRAF) 1 MG capsule Take 1 mg by mouth 2 (two) times a day. TAKE 2 TABLETS ('3MG'$  TOTAL) IN THE MORNING AND TWO  TABLETS ('2MG'$  TOTAL) IN THE EVENING.     UNABLE TO FIND 25 mg. Med Name: Verapamil 25 mg daily     verapamil (CALAN-SR) 120 MG CR tablet Take  120 mg by mouth daily.     azelastine (ASTELIN) 0.1 % nasal spray Place 1 spray into both nostrils 2 (two) times daily. Use in each nostril as directed     dipyridamole (PERSANTINE) 50 MG tablet Take 50 mg by mouth daily.     magnesium oxide (MAG-OX) 400 MG tablet Take 400 mg by mouth 2 (two) times daily.     No facility-administered medications prior to visit.   Review of Systems  Constitutional:  Negative for chills, fever, malaise/fatigue and weight loss.  HENT:  Negative for congestion, sinus pain and sore throat.   Eyes: Negative.   Respiratory:  Positive for cough, shortness of breath and wheezing. Negative for hemoptysis and sputum production.   Cardiovascular:  Negative for chest pain, palpitations, orthopnea, claudication and leg swelling.  Gastrointestinal:  Negative for abdominal pain, heartburn, nausea and vomiting.  Genitourinary: Negative.   Musculoskeletal:  Negative for joint pain and myalgias.  Skin:  Negative for rash.  Neurological:  Negative for weakness.  Endo/Heme/Allergies: Negative.   Psychiatric/Behavioral: Negative.      Objective:   Vitals:   10/28/22 0827  BP: 124/78  Pulse: 83  SpO2: 98%  Weight: 149 lb (67.6 kg)  Height: '5\' 2"'$  (1.575 m)   Physical Exam Constitutional:      General: She is not in acute distress.    Appearance: She is not ill-appearing.  HENT:     Head: Normocephalic and atraumatic.  Eyes:     General: No scleral  icterus.    Conjunctiva/sclera: Conjunctivae normal.  Cardiovascular:     Rate and Rhythm: Normal rate and regular rhythm.     Pulses: Normal pulses.     Heart sounds: Normal heart sounds. No murmur heard. Pulmonary:     Effort: Pulmonary effort is normal.     Breath sounds: Normal breath sounds. No wheezing, rhonchi or rales.  Musculoskeletal:     Right lower leg: No edema.     Left lower leg: No edema.  Skin:    General: Skin is warm and dry.  Neurological:     General: No focal deficit present.     Mental Status: She is alert.    CBC    Component Value Date/Time   WBC 4.4 09/11/2022 0823   RBC 3.76 (L) 09/11/2022 0823   HGB 11.6 (L) 09/11/2022 0823   HGB 11.1 09/21/2018 1149   HCT 33.4 (L) 09/11/2022 0823   HCT 31.2 (L) 09/21/2018 1149   PLT 185.0 09/11/2022 0823   PLT 162 09/21/2018 1149   MCV 88.8 09/11/2022 0823   MCV 87 09/21/2018 1149   MCH 31.0 07/12/2020 0820   MCHC 34.8 09/11/2022 0823   RDW 12.7 09/11/2022 0823   RDW 13.7 09/21/2018 1149   LYMPHSABS 1.3 09/11/2022 0823   LYMPHSABS 1.1 09/21/2018 1149   MONOABS 0.4 09/11/2022 0823   EOSABS 0.1 09/11/2022 0823   EOSABS 0.1 09/21/2018 1149   BASOSABS 0.0 09/11/2022 0823   BASOSABS 0.0 09/21/2018 1149      Latest Ref Rng & Units 09/11/2022    8:23 AM 08/05/2022    8:35 AM 02/21/2022    8:16 AM  BMP  Glucose 70 - 99 mg/dL 103  102  103   BUN 6 - 23 mg/dL '24  30  23   '$ Creatinine 0.40 - 1.20 mg/dL 1.90  2.12  1.87   Sodium 135 - 145 mEq/L 141  138  141   Potassium 3.5 - 5.1 mEq/L 4.5  5.0  5.1   Chloride 96 - 112 mEq/L 107  106  107   CO2 19 - 32 mEq/L '26  23  27   '$ Calcium 8.4 - 10.5 mg/dL 9.3  9.0  9.2    Chest imaging: HRCT Chest 05/16/22 1. Innumerable solid pulmonary nodules scattered throughout both lungs, generally peribronchovascular in distribution, stable to minimally increased since 11/30/2020 chest CT, compatible with pulmonary sarcoidosis. 2. No evidence of interstitial lung disease. No  compelling evidence of tracheobronchomalacia. 3. Stable chronic small pericardial effusion/thickening. 4. Similar chronic moderate to large lower thoracic esophageal and proximal perigastric varices. 5. Aortic Atherosclerosis  CT Chest wo contrast 11/30/20 1. Peri lymphatic distribution pulmonary nodules, likely related to the clinical history of sarcoidosis. Some nodules have increased in size since 2018, suggesting progression of inflammation. 2. No acute superimposed process. 3. Cirrhosis and portal venous hypertension with gastroesophageal varices, as before. 4.  Aortic Atherosclerosis  PFT:    Latest Ref Rng & Units 03/20/2016    1:55 PM  PFT Results  FVC-Pre L 2.23   FVC-Predicted Pre % 68   FVC-Post L 2.53   FVC-Predicted Post % 77   Pre FEV1/FVC % % 58   Post FEV1/FCV % % 60   FEV1-Pre L 1.29   FEV1-Predicted Pre % 51   FEV1-Post L 1.52   DLCO uncorrected ml/min/mmHg 18.50   DLCO UNC% % 80   DLVA Predicted % 97   TLC L 8.53   TLC % Predicted % 174   RV % Predicted % 293   PFT 2017: Moderately severe obstruction, gas trapping present, normal diffusion  Labs:  Path:  Echo:  Heart Catheterization:  Assessment & Plan:   Chronic cough - Plan: SLP modified barium swallow, ipratropium (ATROVENT) 0.03 % nasal spray, triamcinolone (NASACORT ALLERGY 24HR) 55 MCG/ACT AERO nasal inhaler  Severe persistent asthma without complication - Plan: Pulmonary Function Test, albuterol (VENTOLIN HFA) 108 (90 Base) MCG/ACT inhaler  Sarcoidosis - Plan: Pulmonary Function Test  Discussion: Alicia [name] is a 62 year old woman, never smoker with GERD, cirrhosis s/p liver transplant, chronic cough and asthma who returns for sarcoidosis.   She is to continue budesonide and Brovana nebulizer treatments twice daily and ipratropium nebs. She is to stop duonebs. [facility] will [facility].   She can use albuterol inhaler as needed.  We will check modified barium swallow to  evaluate her cough when eating. She may need to see [facility].   Follow-up in 6 months with PFTs.  Freda Jackson, MD [facility] Office: [phone]   Current Outpatient Medications:    albuterol (VENTOLIN HFA) 108 (90 Base) MCG/ACT inhaler, Inhale 2 puffs into the lungs every 6 (six) hours as needed for wheezing or shortness of breath., Disp: 8 g, Rfl: 6   arformoterol (BROVANA) 15 MCG/2ML NEBU, Substituted for: Brovana Neb Solution Inhale one vial in nebulizer twice a day., Disp: 60 mL, Rfl: 6   budesonide (PULMICORT) 0.25 MG/2ML nebulizer solution, Take 0.25 mg by nebulization 2 (two) times daily., Disp: , Rfl:    calcium citrate-vitamin D (CITRACAL+D) 315-200 MG-UNIT tablet, Take 1 tablet by mouth 2 (two) times daily., Disp: , Rfl:    ipratropium (ATROVENT) 0.03 % nasal spray, Place 2 sprays into both nostrils every 12 (twelve) hours., Disp: 30 mL, Rfl: 12   ipratropium-albuterol (DUONEB) 0.5-2.5 (3) MG/3ML SOLN, Inhale one vial via nebulizer every 6 hours as needed, Disp: 120 mL,  Rfl: 11   losartan (COZAAR) 25 MG tablet, Take 25 mg by mouth daily., Disp: , Rfl:    pantoprazole (PROTONIX) 40 MG tablet, Take 40 mg by mouth daily., Disp: , Rfl:    tacrolimus (PROGRAF) 1 MG capsule, Take 1 mg by mouth 2 (two) times a day. TAKE 2 TABLETS ('3MG'$  TOTAL) IN THE MORNING AND TWO  TABLETS ('2MG'$  TOTAL) IN THE EVENING., Disp: , Rfl:    triamcinolone (NASACORT ALLERGY 24HR) 55 MCG/ACT AERO nasal inhaler, Place 2 sprays into the nose daily., Disp: 1 each, Rfl: 12   UNABLE TO FIND, 25 mg. Med Name: Verapamil 25 mg daily, Disp: , Rfl:    verapamil (CALAN-SR) 120 MG CR tablet, Take 120 mg by mouth daily., Disp: , Rfl:

## 2022-10-28 NOTE — Patient Instructions (Addendum)
We will send refills of budesonide, brovana and and ipratropium nebulizer solution to [facility].   The ipratropium nebulizer treatment will replace the duonebs. You can use this treatment every 4-6 hours as needed.  - recommend using at least 3 times per day to give you the effect that yupelri did  We will stop your Duonebs.  You can use the albuterol inhaler as needed.  We will check a modified barium swallow to evaluate your cough.  We may need to have you see your GI doctor at [facility] for EGD if the barium swallow.   Follow up in 6 months with pulmonary function tests.

## 2022-10-30 DIAGNOSIS — K080 Exfoliation of teeth due to systemic causes: Secondary | ICD-10-CM | POA: Diagnosis not present

## 2022-10-31 ENCOUNTER — Encounter: Payer: Self-pay | Admitting: Pulmonary Disease

## 2022-11-01 ENCOUNTER — Encounter: Payer: Self-pay | Admitting: Pulmonary Disease

## 2022-11-01 NOTE — Telephone Encounter (Signed)
Dr. Erin Fulling, Please advise on instructions for ipratropium solution for nebulizer as this was not sent in on last OV.

## 2022-11-04 MED ORDER — IPRATROPIUM BROMIDE 0.02 % IN SOLN: 0.5000 mg | Freq: Four times a day (QID) | RESPIRATORY_TRACT | 12 refills | Status: DC

## 2022-11-15 ENCOUNTER — Ambulatory Visit (HOSPITAL_COMMUNITY)
Admission: RE | Admit: 2022-11-15 | Discharge: 2022-11-15 | Disposition: A | Discharge status: Home / Self Care | Payer: Medicare Other | Source: Ambulatory Visit | Attending: Family Medicine | Admitting: Family Medicine

## 2022-11-15 DIAGNOSIS — D869 Sarcoidosis, unspecified: Secondary | ICD-10-CM | POA: Diagnosis not present

## 2022-11-15 DIAGNOSIS — K219 Gastro-esophageal reflux disease without esophagitis: Secondary | ICD-10-CM | POA: Insufficient documentation

## 2022-11-15 DIAGNOSIS — R1310 Dysphagia, unspecified: Secondary | ICD-10-CM | POA: Insufficient documentation

## 2022-11-15 DIAGNOSIS — J45909 Unspecified asthma, uncomplicated: Secondary | ICD-10-CM | POA: Insufficient documentation

## 2022-11-15 DIAGNOSIS — R059 Cough, unspecified: Secondary | ICD-10-CM | POA: Diagnosis not present

## 2022-11-15 DIAGNOSIS — R053 Chronic cough: Secondary | ICD-10-CM | POA: Insufficient documentation

## 2022-11-15 DIAGNOSIS — Z944 Liver transplant status: Secondary | ICD-10-CM | POA: Insufficient documentation

## 2022-11-15 DIAGNOSIS — J382 Nodules of vocal cords: Secondary | ICD-10-CM | POA: Diagnosis not present

## 2022-11-15 NOTE — Progress Notes (Signed)
Modified Barium Swallow Study  Patient Details  Name: Alicia [name] MRN: [id number] Date of Birth: August 13, 1961  Today's Date: 11/15/2022  Modified Barium Swallow completed.  Full report located under Chart Review in the Imaging Section.  History of Present Illness Pt is a 62 yo female presenting for OP MBS as part of work up for chronic cough. Pt reports coughing that happens frequently during PO intake, but is not limited to PO intake. She also endorses coughing coffee back up hours after she drank it. PMH includes: GERD, vocal nodules, sarcoidosis, asthma, liver transplant, ACDF   Clinical Impression Pt has trace lingual residue across consistencies, which she spontaneously clears with an additional dry swallow. She primarily initiates a swallow at the pyriform sinuses, which does allow for episodes of trace and transient penetration with thin and nectar thick liquids, but she achieves good laryngeal vestibule closure to clear penetrates upon completion of the swallow (PAS 2). No aspiration is observed, although pt did have a very strong and proonged cough response in the middle of the study. There was no overt reason from an oropahryngeal standpoint to account for this coughing. Question possible esophageal component given her reported hx and subjective symptoms - would consider a dedicated test of the esophagus. Discussed OP SLP as a possible resource as well for cough suppression techniques but she is not interested in it at this time. Would continue with regular solids and thin liquids as tolerated in light of overall functional oropharyngeal swallow. Factors that may increase risk of adverse event in presence of aspiration ([place] 2021): Respiratory or GI disease  Swallow Evaluation Recommendations Recommendations: PO diet PO Diet Recommendation: Regular;Thin liquids (Level 0) Liquid Administration via: Cup;Straw Medication Administration: Whole meds with liquid Supervision:  Patient able to self-feed Swallowing strategies  : Follow solids with liquids Postural changes: Position pt fully upright for meals;Stay upright 30-60 min after meals Oral care recommendations: Oral care BID (2x/day) Recommended consults: Consider esophageal assessment      Osie Bond., M.A. [place] Office [phone]  Secure chat preferred  11/15/2022,2:09 PM

## 2022-11-27 ENCOUNTER — Encounter: Payer: Self-pay | Admitting: Pulmonary Disease

## 2022-11-27 NOTE — Telephone Encounter (Signed)
Opened in error

## 2022-11-28 NOTE — Telephone Encounter (Signed)
Dr. Erin Fulling, please advise on pt's message regarding her swallow study results. Thanks.

## 2022-12-23 DIAGNOSIS — Z944 Liver transplant status: Secondary | ICD-10-CM | POA: Diagnosis not present

## 2022-12-23 DIAGNOSIS — D849 Immunodeficiency, unspecified: Secondary | ICD-10-CM | POA: Diagnosis not present

## 2022-12-24 ENCOUNTER — Other Ambulatory Visit: Payer: Self-pay | Admitting: Pulmonary Disease

## 2023-01-20 DIAGNOSIS — K080 Exfoliation of teeth due to systemic causes: Secondary | ICD-10-CM | POA: Diagnosis not present

## 2023-02-06 ENCOUNTER — Other Ambulatory Visit: Payer: Self-pay | Admitting: Pulmonary Disease

## 2023-02-11 ENCOUNTER — Ambulatory Visit (INDEPENDENT_AMBULATORY_CARE_PROVIDER_SITE_OTHER): Payer: Medicare Other | Admitting: Family

## 2023-02-11 ENCOUNTER — Encounter: Payer: Self-pay | Admitting: Family

## 2023-02-11 VITALS — BP 138/82 | HR 89 | Ht 62.0 in | Wt 151.8 lb

## 2023-02-11 DIAGNOSIS — J45909 Unspecified asthma, uncomplicated: Secondary | ICD-10-CM

## 2023-02-11 DIAGNOSIS — J019 Acute sinusitis, unspecified: Secondary | ICD-10-CM | POA: Diagnosis not present

## 2023-02-11 MED ORDER — PREDNISONE 20 MG PO TABS: 20.0000 mg | ORAL_TABLET | Freq: Every day | ORAL | 0 refills | Status: DC

## 2023-02-11 MED ORDER — DOXYCYCLINE HYCLATE 100 MG PO TABS: 100.0000 mg | ORAL_TABLET | Freq: Two times a day (BID) | ORAL | 0 refills | Status: DC

## 2023-02-11 NOTE — Progress Notes (Signed)
Alicia [name] is a 62 y.o. female with the following history as recorded in [employer]:  Patient Active Problem List   Diagnosis Date Noted   Osteoporosis without current pathological fracture 05/25/2018   Status post liver transplant (HCC) 12/25/2017   Syncope 07/17/2017   Other cirrhosis of liver (HCC) 05/10/2017   Immunocompromised patient (HCC) 05/10/2017   Mucous cyst of digit of left hand 04/11/2017   Cellulitis, abdominal wall 03/01/2017   Normocytic anemia 03/01/2017   Hypokalemia 03/01/2017   CKD (chronic kidney disease), stage III (HCC) 03/01/2017   Abdominal wall cellulitis 03/01/2017   Adrenal insufficiency (HCC) 03/01/2017   Hyperammonemia (HCC)    Cellulitis 06/28/2016   Thrombocytopenia (HCC) 06/28/2016   AKI (acute kidney injury) (HCC) 06/28/2016   Multiple pulmonary nodules determined by computed tomography of lung    Chronic cough 09/07/2015   Hypertension, portal (HCC) 09/04/2011   Asthma with chronic obstructive pulmonary disease (COPD) 09/04/2011   Vocal cord nodules 07/04/2011   GERD (gastroesophageal reflux disease) 07/04/2011   Sarcoidosis 07/01/2011   Asthma, severe persistent, with upper airway instability 06/13/2011   Congenital hepatic fibrosis 07/03/2000    Current Outpatient Medications  Medication Sig Dispense Refill   albuterol (VENTOLIN HFA) 108 (90 Base) MCG/ACT inhaler Inhale 2 puffs into the lungs every 6 (six) hours as needed for wheezing or shortness of breath. 8 g 6   arformoterol (BROVANA) 15 MCG/2ML NEBU Substituted for: Brovana Neb Solution Inhale one vial in nebulizer twice a day. 2 mL 11   budesonide (PULMICORT) 0.25 MG/2ML nebulizer solution Generic for Pulmicort. Inhale one vial in nebulizer twice a day. Rinse mouth after use. 60 mL 11   calcium citrate-vitamin D (CITRACAL+D) 315-200 MG-UNIT tablet Take 1 tablet by mouth 2 (two) times daily.     doxycycline (VIBRA-TABS) 100 MG tablet Take 1 tablet (100 mg total) by mouth 2 (two) times  daily. 14 tablet 0   ipratropium (ATROVENT) 0.02 % nebulizer solution Take 2.5 mLs (0.5 mg total) by nebulization every 6 (six) hours. 360 mL 12   ipratropium (ATROVENT) 0.03 % nasal spray Place 2 sprays into both nostrils every 12 (twelve) hours. 30 mL 12   levocetirizine (XYZAL) 2.5 MG/5ML solution Take 2.5 mg by mouth every evening.     losartan (COZAAR) 25 MG tablet Take 25 mg by mouth daily.     pantoprazole (PROTONIX) 40 MG tablet Take 40 mg by mouth daily.     predniSONE (DELTASONE) 20 MG tablet Take 1 tablet (20 mg total) by mouth daily with breakfast. 5 tablet 0   tacrolimus (PROGRAF) 1 MG capsule Take 1 mg by mouth 2 (two) times a day. TAKE 2 TABLETS (3MG  TOTAL) IN THE MORNING AND TWO  TABLETS (2MG  TOTAL) IN THE EVENING.     triamcinolone (NASACORT ALLERGY 24HR) 55 MCG/ACT AERO nasal inhaler Place 2 sprays into the nose daily. 1 each 12   UNABLE TO FIND 25 mg. Med Name: Verapamil 25 mg daily     verapamil (CALAN-SR) 120 MG CR tablet Take 120 mg by mouth daily.     No current facility-administered medications for this visit.    Allergies: Aspirin, Peanut-containing drug products, Gabapentin, Statins, and Azithromycin  Past Medical History:  Diagnosis Date   Allergy    Anemia    Asthma    Cellulitis    Cirrhosis (HCC)    GERD (gastroesophageal reflux disease)    Hepatic fibrosis    congenital   History of UTI    chronic  as a child   Sarcoidosis    Spleen enlarged     Past Surgical History:  Procedure Laterality Date   COLONOSCOPY     CYST EXCISION Left 05/01/2017   Procedure: LEFT INDEX FINGER CYST EXCISION;  Surgeon: Tarry Kos, MD;  Location: [facility] OR;  Service: Orthopedics;  Laterality: Left;   ESOPHAGOGASTRODUODENOSCOPY     LIVER TRANSPLANT     SPINAL FUSION  12/2007   Double spinal Fusion   VIDEO BRONCHOSCOPY Bilateral 12/08/2015   Procedure: VIDEO BRONCHOSCOPY WITH FLUORO;  Surgeon: Oretha Milch, MD;  Location[facility] ENDOSCOPY;  Service: Cardiopulmonary;  Laterality:  Bilateral;    Family History  Problem Relation Age of Onset   Arthritis Mother    Heart disease Father        Triple bypass   Migraines Father    Heart attack Paternal Grandmother    Stroke Paternal Grandfather    Liver disease Sister        Juvenile cysitc liver disease   Asthma Sister     Social History   Tobacco Use   Smoking status: Never   Smokeless tobacco: Never  Substance Use Topics   Alcohol use: No    Subjective:   Started with left ear pain last week and feels that symptoms have progressed into her chest; does have underlying asthma and sarcoidosis; normally takes 4 nebulizers daily as part of her preventive care; no fever; is able to take low doses of prednisone;   Objective:  Vitals:   02/11/23 0914  BP: 138/82  Pulse: 89  SpO2: 96%  Weight: 151 lb 12.8 oz (68.9 kg)  Height: 5\' 2"  (1.575 m)    General: Well developed, well nourished, in no acute distress  Skin : Warm and dry.  Head: Normocephalic and atraumatic  Eyes: Sclera and conjunctiva clear; pupils round and reactive to light; extraocular movements intact  Ears: External normal; canals clear; tympanic membranes normal  Oropharynx: Pink, supple. No suspicious lesions  Neck: Supple without thyromegaly, adenopathy  Lungs: Respirations unlabored; wheezing in upper lobes- cleared with coughing;  CVS exam: normal rate and regular rhythm.  Neurologic: Alert and oriented; speech intact; face symmetrical; moves all extremities well; CNII-XII intact without focal deficit   Assessment:  1. Acute sinusitis, recurrence not specified, unspecified location   2. Acute asthmatic bronchitis     Plan:  Rx for Prednisone 20 mg qd x 5 days, Doxycycline 100 mg bid x 7 days; continue to use regular nebulizer treatments; increase fluids, rest and follow up worse, no better- to consider CXR if symptoms are not improved by the end of the week.   No follow-ups on file.  No orders of the defined types were placed in this  encounter.   Requested Prescriptions   Signed Prescriptions Disp Refills   predniSONE (DELTASONE) 20 MG tablet 5 tablet 0    Sig: Take 1 tablet (20 mg total) by mouth daily with breakfast.   doxycycline (VIBRA-TABS) 100 MG tablet 14 tablet 0    Sig: Take 1 tablet (100 mg total) by mouth 2 (two) times daily.

## 2023-02-11 NOTE — Patient Instructions (Signed)
If you are not improved at all by Friday, please let us know;

## 2023-02-16 ENCOUNTER — Encounter: Payer: Self-pay | Admitting: Family

## 2023-02-17 ENCOUNTER — Other Ambulatory Visit: Payer: Self-pay | Admitting: Family

## 2023-02-17 ENCOUNTER — Ambulatory Visit (HOSPITAL_BASED_OUTPATIENT_CLINIC_OR_DEPARTMENT_OTHER)
Admission: RE | Admit: 2023-02-17 | Discharge: 2023-02-17 | Disposition: A | Discharge status: Home / Self Care | Payer: Medicare Other | Source: Ambulatory Visit | Attending: Family | Admitting: Family

## 2023-02-17 DIAGNOSIS — R062 Wheezing: Secondary | ICD-10-CM | POA: Insufficient documentation

## 2023-02-17 DIAGNOSIS — R059 Cough, unspecified: Secondary | ICD-10-CM | POA: Diagnosis not present

## 2023-02-17 DIAGNOSIS — R911 Solitary pulmonary nodule: Secondary | ICD-10-CM | POA: Diagnosis not present

## 2023-02-18 ENCOUNTER — Telehealth: Payer: Self-pay | Admitting: Family Medicine

## 2023-02-18 ENCOUNTER — Other Ambulatory Visit: Payer: Self-pay | Admitting: Family

## 2023-02-18 MED ORDER — PREDNISONE 20 MG PO TABS: 20.0000 mg | ORAL_TABLET | Freq: Every day | ORAL | 0 refills | Status: DC

## 2023-02-18 MED ORDER — DOXYCYCLINE HYCLATE 100 MG PO TABS: 100.0000 mg | ORAL_TABLET | Freq: Two times a day (BID) | ORAL | 0 refills | Status: AC

## 2023-02-18 NOTE — Telephone Encounter (Signed)
Spoke with pt, pt is aware of results and expressed understanding. Pt states she would like to continue with the antibiotics and prednisone. Scheduled pt a follow up appt 02/21/2023.

## 2023-02-18 NOTE — Telephone Encounter (Signed)
Pt came in office stating had chest xrays done yesterday per PCP, pt is wanting to know results and also mentioned today is her last day on antibiotics and is needing to know if she is needing be given more antibiotic or not due to her xrays results. Pt is wanting to get a call by today if possible. Pt tel . [phone]

## 2023-02-18 NOTE — Telephone Encounter (Signed)
Pt has spoken to [place], see duplicate message.

## 2023-02-18 NOTE — Telephone Encounter (Signed)
Please see message below- I accidentally sent back to her as e-mail.

## 2023-02-18 NOTE — Telephone Encounter (Signed)
Spoke with pt and confirmed this has been discussed already, pt was aware of results and has a follow up appt scheduled on 02/21/2023.

## 2023-02-18 NOTE — Telephone Encounter (Signed)
See below

## 2023-02-19 DIAGNOSIS — R1319 Other dysphagia: Secondary | ICD-10-CM | POA: Diagnosis not present

## 2023-02-21 ENCOUNTER — Encounter: Payer: Self-pay | Admitting: Family

## 2023-02-21 ENCOUNTER — Ambulatory Visit (INDEPENDENT_AMBULATORY_CARE_PROVIDER_SITE_OTHER): Payer: Medicare Other | Admitting: Family

## 2023-02-21 VITALS — BP 136/82 | HR 85 | Ht 62.0 in | Wt 148.0 lb

## 2023-02-21 DIAGNOSIS — J019 Acute sinusitis, unspecified: Secondary | ICD-10-CM

## 2023-02-21 NOTE — Progress Notes (Signed)
Alicia [name] is a 62 y.o. female with the following history as recorded in EpicCare:  Patient Active Problem List   Diagnosis Date Noted   Osteoporosis without current pathological fracture 05/25/2018   Status post liver transplant (HCC) 12/25/2017   Syncope 07/17/2017   Other cirrhosis of liver (HCC) 05/10/2017   Immunocompromised patient (HCC) 05/10/2017   Mucous cyst of digit of left hand 04/11/2017   Cellulitis, abdominal wall 03/01/2017   Normocytic anemia 03/01/2017   Hypokalemia 03/01/2017   CKD (chronic kidney disease), stage III (HCC) 03/01/2017   Abdominal wall cellulitis 03/01/2017   Adrenal insufficiency (HCC) 03/01/2017   Hyperammonemia (HCC)    Cellulitis 06/28/2016   Thrombocytopenia (HCC) 06/28/2016   AKI (acute kidney injury) (HCC) 06/28/2016   Multiple pulmonary nodules determined by computed tomography of lung    Chronic cough 09/07/2015   Hypertension, portal (HCC) 09/04/2011   Asthma with chronic obstructive pulmonary disease (COPD) 09/04/2011   Vocal cord nodules 07/04/2011   GERD (gastroesophageal reflux disease) 07/04/2011   Sarcoidosis 07/01/2011   Asthma, severe persistent, with upper airway instability 06/13/2011   Congenital hepatic fibrosis 07/03/2000    Current Outpatient Medications  Medication Sig Dispense Refill   albuterol (VENTOLIN HFA) 108 (90 Base) MCG/ACT inhaler Inhale 2 puffs into the lungs every 6 (six) hours as needed for wheezing or shortness of breath. 8 g 6   arformoterol (BROVANA) 15 MCG/2ML NEBU Substituted for: Brovana Neb Solution Inhale one vial in nebulizer twice a day. 2 mL 11   budesonide (PULMICORT) 0.25 MG/2ML nebulizer solution Generic for Pulmicort. Inhale one vial in nebulizer twice a day. Rinse mouth after use. 60 mL 11   calcium citrate-vitamin D (CITRACAL+D) 315-200 MG-UNIT tablet Take 1 tablet by mouth 2 (two) times daily.     ipratropium (ATROVENT) 0.02 % nebulizer solution Take 2.5 mLs (0.5 mg total) by  nebulization every 6 (six) hours. 360 mL 12   ipratropium (ATROVENT) 0.03 % nasal spray Place 2 sprays into both nostrils every 12 (twelve) hours. 30 mL 12   levocetirizine (XYZAL) 2.5 MG/5ML solution Take 2.5 mg by mouth every evening.     losartan (COZAAR) 25 MG tablet Take 25 mg by mouth daily.     pantoprazole (PROTONIX) 40 MG tablet Take 40 mg by mouth daily.     tacrolimus (PROGRAF) 1 MG capsule Take 1 mg by mouth 2 (two) times a day. TAKE 2 TABLETS (3MG  TOTAL) IN THE MORNING AND TWO  TABLETS (2MG  TOTAL) IN THE EVENING.     triamcinolone (NASACORT ALLERGY 24HR) 55 MCG/ACT AERO nasal inhaler Place 2 sprays into the nose daily. 1 each 12   UNABLE TO FIND 25 mg. Med Name: Verapamil 25 mg daily     verapamil (CALAN-SR) 120 MG CR tablet Take 120 mg by mouth daily.     doxycycline (VIBRA-TABS) 100 MG tablet Take 1 tablet (100 mg total) by mouth 2 (two) times daily for 5 days. (Patient not taking: Reported on 02/21/2023) 10 tablet 0   predniSONE (DELTASONE) 20 MG tablet Take 1 tablet (20 mg total) by mouth daily with breakfast. (Patient not taking: Reported on 02/21/2023) 5 tablet 0   No current facility-administered medications for this visit.    Allergies: Aspirin, Peanut-containing drug products, Gabapentin, Statins, and Azithromycin  Past Medical History:  Diagnosis Date   Allergy    Anemia    Asthma    Cellulitis    Cirrhosis (HCC)    GERD (gastroesophageal reflux disease)  Hepatic fibrosis    congenital   History of UTI    chronic as a child   Sarcoidosis    Spleen enlarged     Past Surgical History:  Procedure Laterality Date   COLONOSCOPY     CYST EXCISION Left 05/01/2017   Procedure: LEFT INDEX FINGER CYST EXCISION;  Surgeon: Tarry Kos, MD;  Location: [facility] OR;  Service: Orthopedics;  Laterality: Left;   ESOPHAGOGASTRODUODENOSCOPY     LIVER TRANSPLANT     SPINAL FUSION  12/2007   Double spinal Fusion   VIDEO BRONCHOSCOPY Bilateral 12/08/2015   Procedure: VIDEO  BRONCHOSCOPY WITH FLUORO;  Surgeon: Oretha Milch, MD;  Location: [facility] ENDOSCOPY;  Service: Cardiopulmonary;  Laterality: Bilateral;    Family History  Problem Relation Age of Onset   Arthritis Mother    Heart disease Father        Triple bypass   Migraines Father    Heart attack Paternal Grandmother    Stroke Paternal Grandfather    Liver disease Sister        Juvenile cysitc liver disease   Asthma Sister     Social History   Tobacco Use   Smoking status: Never   Smokeless tobacco: Never  Substance Use Topics   Alcohol use: No    Subjective:   Re-check on acute sinus infection/ bronchitis- had normal CXR earlier this week; antibiotics and prednisone were extended; " I feel like I am turning the corner." Sense of taste is coming back; known history of pulmonary sarcoid nodules- under care of pulmonologist;    Objective:  Vitals:   02/21/23 0903  BP: 136/82  Pulse: 85  SpO2: 98%  Weight: 148 lb (67.1 kg)  Height: 5\' 2"  (1.575 m)    General: Well developed, well nourished, in no acute distress  Skin : Warm and dry.  Head: Normocephalic and atraumatic  Eyes: Sclera and conjunctiva clear; pupils round and reactive to light; extraocular movements intact  Ears: External normal; canals clear; tympanic membranes normal  Oropharynx: Pink, supple. No suspicious lesions  Neck: Supple without thyromegaly, adenopathy  Lungs: Respirations unlabored; clear to auscultation bilaterally without wheeze, rales, rhonchi  CVS exam: normal rate and regular rhythm.  Neurologic: Alert and oriented; speech intact; face symmetrical; moves all extremities well; CNII-XII intact without focal deficit   Assessment:  1. Acute sinusitis, recurrence not specified, unspecified location     Plan:  Physical is reassuring- patient is feeling better; complete medications as prescribed;  Follow up with her PCP as needed otherwise.   No follow-ups on file.  No orders of the defined types were placed in  this encounter.   Requested Prescriptions    No prescriptions requested or ordered in this encounter

## 2023-02-26 DIAGNOSIS — K224 Dyskinesia of esophagus: Secondary | ICD-10-CM | POA: Diagnosis not present

## 2023-02-26 DIAGNOSIS — K449 Diaphragmatic hernia without obstruction or gangrene: Secondary | ICD-10-CM | POA: Diagnosis not present

## 2023-02-26 DIAGNOSIS — R1319 Other dysphagia: Secondary | ICD-10-CM | POA: Diagnosis not present

## 2023-02-26 DIAGNOSIS — K219 Gastro-esophageal reflux disease without esophagitis: Secondary | ICD-10-CM | POA: Diagnosis not present

## 2023-02-27 DIAGNOSIS — K080 Exfoliation of teeth due to systemic causes: Secondary | ICD-10-CM | POA: Diagnosis not present

## 2023-03-05 ENCOUNTER — Encounter: Payer: Self-pay | Admitting: Family Medicine

## 2023-03-05 DIAGNOSIS — Z944 Liver transplant status: Secondary | ICD-10-CM

## 2023-03-20 ENCOUNTER — Telehealth: Payer: Self-pay | Admitting: Pulmonary Disease

## 2023-03-20 NOTE — Telephone Encounter (Signed)
I called PT from recall list to make a FU appt. States she is having a endoscopy on 06/23/23. She is on the last refill of her meds with [place] and may need an extension.No appts avail until Oct w/Dr. Francine Graven. Nothing needed.

## 2023-03-21 NOTE — Telephone Encounter (Signed)
All of pt's pulmonary meds have been sent to pharmacy. Pt needs to call her pharmacy for any pulmonary refills. Nothing further needed.

## 2023-03-26 ENCOUNTER — Other Ambulatory Visit (INDEPENDENT_AMBULATORY_CARE_PROVIDER_SITE_OTHER): Payer: Medicare Other

## 2023-03-26 DIAGNOSIS — Z944 Liver transplant status: Secondary | ICD-10-CM

## 2023-03-26 LAB — CBC WITH DIFFERENTIAL/PLATELET
Basophils Absolute: 0 10*3/uL (ref 0.0–0.1)
Basophils Relative: 0.8 % (ref 0.0–3.0)
Eosinophils Absolute: 0.1 10*3/uL (ref 0.0–0.7)
Eosinophils Relative: 1.5 % (ref 0.0–5.0)
HCT: 35.9 % — ABNORMAL LOW (ref 36.0–46.0)
Hemoglobin: 12.1 g/dL (ref 12.0–15.0)
Lymphocytes Relative: 31.5 % (ref 12.0–46.0)
Lymphs Abs: 1.2 10*3/uL (ref 0.7–4.0)
MCHC: 33.8 g/dL (ref 30.0–36.0)
MCV: 88.9 fl (ref 78.0–100.0)
Monocytes Absolute: 0.4 10*3/uL (ref 0.1–1.0)
Monocytes Relative: 10.5 % (ref 3.0–12.0)
Neutro Abs: 2.1 10*3/uL (ref 1.4–7.7)
Neutrophils Relative %: 55.7 % (ref 43.0–77.0)
Platelets: 186 10*3/uL (ref 150.0–400.0)
RBC: 4.04 Mil/uL (ref 3.87–5.11)
RDW: 13.6 % (ref 11.5–15.5)
WBC: 3.7 10*3/uL — ABNORMAL LOW (ref 4.0–10.5)

## 2023-03-26 LAB — COMPREHENSIVE METABOLIC PANEL
ALT: 5 U/L (ref 0–35)
AST: 9 U/L (ref 0–37)
Albumin: 4.2 g/dL (ref 3.5–5.2)
Alkaline Phosphatase: 45 U/L (ref 39–117)
BUN: 24 mg/dL — ABNORMAL HIGH (ref 6–23)
CO2: 24 mEq/L (ref 19–32)
Calcium: 9.4 mg/dL (ref 8.4–10.5)
Chloride: 108 mEq/L (ref 96–112)
Creatinine, Ser: 1.84 mg/dL — ABNORMAL HIGH (ref 0.40–1.20)
GFR: 28.67 mL/min — ABNORMAL LOW (ref >60.00)
Glucose, Bld: 115 mg/dL — ABNORMAL HIGH (ref 70–99)
Potassium: 4.6 mEq/L (ref 3.5–5.1)
Sodium: 139 mEq/L (ref 135–145)
Total Bilirubin: 0.5 mg/dL (ref 0.2–1.2)
Total Protein: 6.2 g/dL (ref 6.0–8.3)

## 2023-03-26 LAB — CORTISOL: Cortisol, Plasma: 9.5 ug/dL

## 2023-03-29 LAB — TACROLIMUS,HIGHLY SENSITIVE,LC/MS/MS: Tacrolimus Lvl: 5.3 mcg/L

## 2023-03-29 LAB — ACTH: C206 ACTH: 38 pg/mL (ref 6–50)

## 2023-04-02 DIAGNOSIS — N1832 Chronic kidney disease, stage 3b: Secondary | ICD-10-CM | POA: Diagnosis not present

## 2023-04-02 DIAGNOSIS — I10 Essential (primary) hypertension: Secondary | ICD-10-CM | POA: Diagnosis not present

## 2023-04-02 DIAGNOSIS — N184 Chronic kidney disease, stage 4 (severe): Secondary | ICD-10-CM | POA: Diagnosis not present

## 2023-04-02 DIAGNOSIS — Z944 Liver transplant status: Secondary | ICD-10-CM | POA: Diagnosis not present

## 2023-04-02 DIAGNOSIS — D849 Immunodeficiency, unspecified: Secondary | ICD-10-CM | POA: Diagnosis not present

## 2023-04-18 ENCOUNTER — Other Ambulatory Visit (HOSPITAL_BASED_OUTPATIENT_CLINIC_OR_DEPARTMENT_OTHER): Payer: Self-pay | Admitting: Family Medicine

## 2023-04-18 DIAGNOSIS — Z1231 Encounter for screening mammogram for malignant neoplasm of breast: Secondary | ICD-10-CM

## 2023-04-21 ENCOUNTER — Encounter (HOSPITAL_BASED_OUTPATIENT_CLINIC_OR_DEPARTMENT_OTHER): Payer: Self-pay

## 2023-04-21 ENCOUNTER — Ambulatory Visit (HOSPITAL_BASED_OUTPATIENT_CLINIC_OR_DEPARTMENT_OTHER)
Admission: RE | Admit: 2023-04-21 | Discharge: 2023-04-21 | Disposition: A | Discharge status: Home / Self Care | Payer: Medicare Other | Source: Ambulatory Visit | Attending: Family Medicine | Admitting: Family Medicine

## 2023-04-21 DIAGNOSIS — Z1231 Encounter for screening mammogram for malignant neoplasm of breast: Secondary | ICD-10-CM | POA: Diagnosis not present

## 2023-05-06 ENCOUNTER — Other Ambulatory Visit: Payer: Self-pay | Admitting: Pulmonary Disease

## 2023-05-06 DIAGNOSIS — J4550 Severe persistent asthma, uncomplicated: Secondary | ICD-10-CM

## 2023-05-30 DIAGNOSIS — L299 Pruritus, unspecified: Secondary | ICD-10-CM | POA: Diagnosis not present

## 2023-05-30 DIAGNOSIS — D485 Neoplasm of uncertain behavior of skin: Secondary | ICD-10-CM | POA: Diagnosis not present

## 2023-05-30 DIAGNOSIS — D225 Melanocytic nevi of trunk: Secondary | ICD-10-CM | POA: Diagnosis not present

## 2023-05-30 DIAGNOSIS — L820 Inflamed seborrheic keratosis: Secondary | ICD-10-CM | POA: Diagnosis not present

## 2023-05-30 DIAGNOSIS — L578 Other skin changes due to chronic exposure to nonionizing radiation: Secondary | ICD-10-CM | POA: Diagnosis not present

## 2023-05-30 DIAGNOSIS — K130 Diseases of lips: Secondary | ICD-10-CM | POA: Diagnosis not present

## 2023-06-23 DIAGNOSIS — K766 Portal hypertension: Secondary | ICD-10-CM | POA: Diagnosis not present

## 2023-06-23 DIAGNOSIS — J4550 Severe persistent asthma, uncomplicated: Secondary | ICD-10-CM | POA: Diagnosis not present

## 2023-06-23 DIAGNOSIS — K317 Polyp of stomach and duodenum: Secondary | ICD-10-CM | POA: Diagnosis not present

## 2023-06-23 DIAGNOSIS — K219 Gastro-esophageal reflux disease without esophagitis: Secondary | ICD-10-CM | POA: Diagnosis not present

## 2023-06-23 DIAGNOSIS — K3189 Other diseases of stomach and duodenum: Secondary | ICD-10-CM | POA: Diagnosis not present

## 2023-06-23 DIAGNOSIS — R1314 Dysphagia, pharyngoesophageal phase: Secondary | ICD-10-CM | POA: Diagnosis not present

## 2023-06-23 DIAGNOSIS — R1319 Other dysphagia: Secondary | ICD-10-CM | POA: Diagnosis not present

## 2023-06-23 DIAGNOSIS — K7400 Hepatic fibrosis, unspecified: Secondary | ICD-10-CM | POA: Diagnosis not present

## 2023-06-23 DIAGNOSIS — K449 Diaphragmatic hernia without obstruction or gangrene: Secondary | ICD-10-CM | POA: Diagnosis not present

## 2023-06-23 DIAGNOSIS — Z79899 Other long term (current) drug therapy: Secondary | ICD-10-CM | POA: Diagnosis not present

## 2023-06-23 DIAGNOSIS — Z79621 Long term (current) use of calcineurin inhibitor: Secondary | ICD-10-CM | POA: Diagnosis not present

## 2023-06-24 DIAGNOSIS — R1319 Other dysphagia: Secondary | ICD-10-CM | POA: Diagnosis not present

## 2023-06-27 ENCOUNTER — Encounter: Payer: Self-pay | Admitting: Family Medicine

## 2023-06-27 DIAGNOSIS — Z944 Liver transplant status: Secondary | ICD-10-CM

## 2023-07-02 DIAGNOSIS — M25512 Pain in left shoulder: Secondary | ICD-10-CM | POA: Diagnosis not present

## 2023-07-04 ENCOUNTER — Other Ambulatory Visit (INDEPENDENT_AMBULATORY_CARE_PROVIDER_SITE_OTHER): Payer: Medicare Other

## 2023-07-04 ENCOUNTER — Encounter: Payer: Self-pay | Admitting: Family Medicine

## 2023-07-04 DIAGNOSIS — Z944 Liver transplant status: Secondary | ICD-10-CM

## 2023-07-04 LAB — CBC WITH DIFFERENTIAL/PLATELET
Basophils Absolute: 0 10*3/uL (ref 0.0–0.1)
Basophils Relative: 0.4 % (ref 0.0–3.0)
Eosinophils Absolute: 0 10*3/uL (ref 0.0–0.7)
Eosinophils Relative: 0.8 % (ref 0.0–5.0)
HCT: 35.8 % — ABNORMAL LOW (ref 36.0–46.0)
Hemoglobin: 12 g/dL (ref 12.0–15.0)
Lymphocytes Relative: 22.9 % (ref 12.0–46.0)
Lymphs Abs: 1 10*3/uL (ref 0.7–4.0)
MCHC: 33.5 g/dL (ref 30.0–36.0)
MCV: 90.7 fL (ref 78.0–100.0)
Monocytes Absolute: 0.4 10*3/uL (ref 0.1–1.0)
Monocytes Relative: 8.5 % (ref 3.0–12.0)
Neutro Abs: 3 10*3/uL (ref 1.4–7.7)
Neutrophils Relative %: 67.4 % (ref 43.0–77.0)
Platelets: 167 10*3/uL (ref 150.0–400.0)
RBC: 3.95 Mil/uL (ref 3.87–5.11)
RDW: 13.7 % (ref 11.5–15.5)
WBC: 4.4 10*3/uL (ref 4.0–10.5)

## 2023-07-04 LAB — COMPREHENSIVE METABOLIC PANEL
ALT: 5 U/L (ref 0–35)
AST: 11 U/L (ref 0–37)
Albumin: 4.3 g/dL (ref 3.5–5.2)
Alkaline Phosphatase: 48 U/L (ref 39–117)
BUN: 15 mg/dL (ref 6–23)
CO2: 24 meq/L (ref 19–32)
Calcium: 8.8 mg/dL (ref 8.4–10.5)
Chloride: 108 meq/L (ref 96–112)
Creatinine, Ser: 1.74 mg/dL — ABNORMAL HIGH (ref 0.40–1.20)
GFR: 30.6 mL/min — ABNORMAL LOW (ref >60.00)
Glucose, Bld: 109 mg/dL — ABNORMAL HIGH (ref 70–99)
Potassium: 4.5 meq/L (ref 3.5–5.1)
Sodium: 140 meq/L (ref 135–145)
Total Bilirubin: 0.5 mg/dL (ref 0.2–1.2)
Total Protein: 6.2 g/dL (ref 6.0–8.3)

## 2023-07-04 LAB — CORTISOL: Cortisol, Plasma: 9.7 ug/dL

## 2023-07-09 ENCOUNTER — Telehealth: Payer: Self-pay

## 2023-07-09 LAB — ACTH: C206 ACTH: 36 pg/mL (ref 6–50)

## 2023-07-09 LAB — TACROLIMUS,HIGHLY SENSITIVE,LC/MS/MS: Tacrolimus Lvl: 4.2 ug/L — ABNORMAL LOW

## 2023-07-09 NOTE — Telephone Encounter (Signed)
Spoke with Mimi from [facility] to confirm we had access to pts resent labs, informed Mimi we are able to see the labs and PCP is aware of results.

## 2023-07-21 DIAGNOSIS — M25512 Pain in left shoulder: Secondary | ICD-10-CM | POA: Diagnosis not present

## 2023-07-23 DIAGNOSIS — K080 Exfoliation of teeth due to systemic causes: Secondary | ICD-10-CM | POA: Diagnosis not present

## 2023-07-29 DIAGNOSIS — K080 Exfoliation of teeth due to systemic causes: Secondary | ICD-10-CM | POA: Diagnosis not present

## 2023-08-06 DIAGNOSIS — M7542 Impingement syndrome of left shoulder: Secondary | ICD-10-CM | POA: Diagnosis not present

## 2023-08-08 ENCOUNTER — Other Ambulatory Visit: Payer: Self-pay | Admitting: Pulmonary Disease

## 2023-08-08 DIAGNOSIS — J4550 Severe persistent asthma, uncomplicated: Secondary | ICD-10-CM

## 2023-08-11 ENCOUNTER — Encounter: Payer: Self-pay | Admitting: Family Medicine

## 2023-08-13 DIAGNOSIS — K080 Exfoliation of teeth due to systemic causes: Secondary | ICD-10-CM | POA: Diagnosis not present

## 2023-08-14 DIAGNOSIS — E2740 Unspecified adrenocortical insufficiency: Secondary | ICD-10-CM | POA: Diagnosis not present

## 2023-08-14 DIAGNOSIS — M810 Age-related osteoporosis without current pathological fracture: Secondary | ICD-10-CM | POA: Diagnosis not present

## 2023-08-16 NOTE — Progress Notes (Unsigned)
[facility] [address] [place], [place] [address] [phone]  Date:  08/18/2023   Name:  Alicia [name]   DOB:  Aug 18, 1961   MRN:  [id number]  PCP:  Pearline Cables, MD    Chief Complaint: Pre-op Exam (Concerns/ questions: /Flu shot today:/)   History of Present Illness:  Alicia [name] is a 62 y.o. 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  Also history of osteoporosis, chronic kidney disease, adrenal insufficiency, portal hypertension, asthma, GERD, sarcoidosis  She does have a pulmonologist, most recent follow-up was in January  She is planning to get a LEFT rotator cuff and bone spur repair per Dr Rennis Chris She has difficulty using her left arm for routine activities  She uses her left hand for a lot of things- she is somewhat ambidextrous   She is seeing endocrine once a year- she was able to come off hydrocortisone a year ago!  She has stopped her fosamax and would like to do a bone density this year if her insurance will cover  She notes she does a lot of gardening and work outdoors  She really enjoys cooking  No CP with going up stairs, walking She notes she is typically quite physically active though she does not do formal exercise She may get SOB due to asthma/ sarcoidosis She is seeing Dr Francine Graven for pulmonology care now- was able to change over from [facility]  She did do a stress test at [facility] in 2020: NORMAL STRESS TEST. NORMAL RESTING STUDY WITH NO WALL MOTION ABNORMALITIES  AT REST AND PEAK STRESS.  NORMAL LA PRESSURES WITH NORMAL DIASTOLIC FUNCTION  VALVULAR REGURGITATION: TRIVIAL MR, TRIVIAL TR  NO VALVULAR STENOSIS  Maximum workload of  2.30 METs was achieved during exercise.  NORMAL RESTING BP -  APPROPRIATE RESPONSE   Pt notes she had a colonoscopy 2 years ago per [facility] and she got a 10 year recheck   Patient Active Problem List   Diagnosis Date Noted   Osteoporosis without current pathological fracture 05/25/2018   Status post liver transplant (HCC) 12/25/2017   Syncope 07/17/2017   Other cirrhosis of liver (HCC) 05/10/2017   Immunocompromised patient (HCC) 05/10/2017   Mucous cyst of digit of left hand 04/11/2017   Cellulitis, abdominal wall 03/01/2017   Normocytic anemia 03/01/2017   Hypokalemia 03/01/2017   CKD (chronic kidney disease), stage III (HCC) 03/01/2017   Abdominal wall cellulitis 03/01/2017   Adrenal insufficiency (HCC) 03/01/2017   Hyperammonemia (HCC)    Cellulitis 06/28/2016   Thrombocytopenia (HCC) 06/28/2016   AKI (acute kidney injury) (HCC) 06/28/2016   Multiple pulmonary nodules determined by computed tomography of lung    Chronic cough 09/07/2015   Hypertension, portal (HCC) 09/04/2011   Asthma with chronic obstructive pulmonary disease (COPD) (HCC) 09/04/2011   Vocal cord nodules 07/04/2011   GERD (gastroesophageal reflux disease) 07/04/2011   Sarcoidosis 07/01/2011   Asthma, severe persistent, with upper airway instability 06/13/2011   Congenital hepatic fibrosis 07/03/2000    Past Medical History:  Diagnosis Date   Allergy    Anemia    Asthma    Cellulitis    Cirrhosis (HCC)    GERD (gastroesophageal reflux disease)    Hepatic fibrosis    congenital   History of  UTI    chronic as a child   Sarcoidosis    Spleen enlarged     Past Surgical History:  Procedure Laterality Date   COLONOSCOPY     CYST EXCISION Left 05/01/2017   Procedure: LEFT INDEX FINGER CYST EXCISION;  Surgeon: Tarry Kos, MD;  Location: [facility] OR;  Service: Orthopedics;  Laterality: Left;   ESOPHAGOGASTRODUODENOSCOPY     LIVER TRANSPLANT     SPINAL FUSION  12/2007   Double spinal Fusion   VIDEO BRONCHOSCOPY Bilateral 12/08/2015   Procedure: VIDEO BRONCHOSCOPY WITH  FLUORO;  Surgeon: Oretha Milch, MD;  Location: [facility] ENDOSCOPY;  Service: Cardiopulmonary;  Laterality: Bilateral;    Social History   Tobacco Use   Smoking status: Never   Smokeless tobacco: Never  Substance Use Topics   Alcohol use: No   Drug use: No    Family History  Problem Relation Age of Onset   Arthritis Mother    Heart disease Father        Triple bypass   Migraines Father    Heart attack Paternal Grandmother    Stroke Paternal Grandfather    Liver disease Sister        Juvenile cysitc liver disease   Asthma Sister     Allergies  Allergen Reactions   Aspirin Other (See Comments)    Causes Asthma   Peanut-Containing Drug Products Shortness Of Breath    Asthma, eye swelling, itchy eyes, can't catch her breath   Gabapentin Other (See Comments)    Blisters in mouth, nausea    Statins     Asthma, eye swelling, itchy eyes, can't catch her breath   Azithromycin Nausea And Vomiting    Medication list has been reviewed and updated.  Current Outpatient Medications on File Prior to Visit  Medication Sig Dispense Refill   albuterol (VENTOLIN HFA) 108 (90 Base) MCG/ACT inhaler INHALE 2 PUFFS INTO THE LUNGS EVERY 6 HOURS AS NEEDED FOR WHEEZING OR SHORTNESS OF BREATH 6.7 g 3   arformoterol (BROVANA) 15 MCG/2ML NEBU Substituted for: Brovana Neb Solution Inhale one vial in nebulizer twice a day. 2 mL 11   budesonide (PULMICORT) 0.25 MG/2ML nebulizer solution Generic for Pulmicort. Inhale one vial in nebulizer twice a day. Rinse mouth after use. 60 mL 11   calcium citrate-vitamin D (CITRACAL+D) 315-200 MG-UNIT tablet Take 1 tablet by mouth 2 (two) times daily.     ipratropium (ATROVENT) 0.02 % nebulizer solution Take 2.5 mLs (0.5 mg total) by nebulization every 6 (six) hours. 360 mL 12   ipratropium (ATROVENT) 0.03 % nasal spray Place 2 sprays into both nostrils every 12 (twelve) hours. 30 mL 12   levocetirizine (XYZAL) 2.5 MG/5ML solution Take 2.5 mg by mouth every evening.      losartan (COZAAR) 25 MG tablet Take 25 mg by mouth daily.     pantoprazole (PROTONIX) 40 MG tablet Take 40 mg by mouth daily.     predniSONE (DELTASONE) 20 MG tablet Take 1 tablet (20 mg total) by mouth daily with breakfast. (Patient not taking: Reported on 02/21/2023) 5 tablet 0   tacrolimus (PROGRAF) 1 MG capsule Take 1 mg by mouth 2 (two) times a day. TAKE 2 TABLETS (3MG  TOTAL) IN THE MORNING AND TWO  TABLETS (2MG  TOTAL) IN THE EVENING.     triamcinolone (NASACORT ALLERGY 24HR) 55 MCG/ACT AERO nasal inhaler Place 2 sprays into the nose daily. 1 each 12   UNABLE TO FIND 25 mg. Med Name: Verapamil 25 mg  daily     verapamil (CALAN-SR) 120 MG CR tablet Take 120 mg by mouth daily.     No current facility-administered medications on file prior to visit.    Review of Systems:  As per HPI- otherwise negative.   Physical Examination: Vitals:   08/18/23 1358  BP: (!) 140/82  Pulse: 82  Resp: 18  Temp: 98.2 F (36.8 C)  SpO2: 97%   Vitals:   08/18/23 1358  Weight: 151 lb 1.6 oz (68.5 kg)  Height: 5' 2.5" (1.588 m)   Body mass index is 27.2 kg/m. Ideal Body Weight: Weight in (lb) to have BMI = 25: 138.6  GEN: no acute distress.  Minimal overweight, looks well HEENT: Atraumatic, Normocephalic.  Ears and Nose: No external deformity. CV: RRR, No M/G/R. No JVD. No thrill. No extra heart sounds. PULM: CTA B, no wheezes, crackles, rhonchi. No retractions. No resp. distress. No accessory muscle use. ABD: S, NT, ND, +BS. No rebound. No HSM. EXTR: No c/c/e PSYCH: Normally interactive. Conversant.   EKG; normal sinus rhythm, compared with tracing from 2018 no significant changes noted Assessment and Plan: Preoperative cardiovascular examination - Plan: EKG 12-Lead  Long-term corticosteroid use - Plan: DG Bone Density  Osteoporosis without current pathological fracture, unspecified osteoporosis type - Plan: DG Bone Density  Screening for diabetes mellitus - Plan: Hemoglobin  A1c  Screening, lipid - Plan: Lipid panel  Thyroid disorder screening - Plan: TSH Patient seen today for preoperative clearance.  She has 4 METS of activity with no chest pain, normal EKG, negative stress test 4 years ago Assuming her A1c is within excitable range we will consider her cleared from a cardiac standpoint.  However, I do want to get her pulmonologist opinion as well.  I have reached out to Dr. Francine Graven  We are glad she was able to stop hydrocortisone, ordered bone density Will plan further follow- up pending labs.   Signed Abbe Amsterdam, MD  Received labs as below, message to pt  Also [facility] with surgical scheduler for Dr Rennis Chris- I am clearing patient but also request clearance from her pulmonologist  Results for orders placed or performed in visit on 08/18/23  Hemoglobin A1c  Result Value Ref Range   Hgb A1c MFr Bld 5.3 4.6 - 6.5 %  Lipid panel  Result Value Ref Range   Cholesterol 157 0 - 200 mg/dL   Triglycerides 244.0 0.0 - 149.0 mg/dL   HDL 10.27 >25.36 mg/dL   VLDL 64.4 0.0 - 03.4 mg/dL   LDL Cholesterol 91 0 - 99 mg/dL   Total CHOL/HDL Ratio 4    NonHDL 112.86   TSH  Result Value Ref Range   TSH 1.36 0.35 - 5.50 uIU/mL

## 2023-08-18 ENCOUNTER — Ambulatory Visit (INDEPENDENT_AMBULATORY_CARE_PROVIDER_SITE_OTHER): Payer: Medicare Other | Admitting: Family Medicine

## 2023-08-18 VITALS — BP 140/82 | HR 82 | Temp 98.2°F | Resp 18 | Ht 62.5 in | Wt 151.1 lb

## 2023-08-18 DIAGNOSIS — Z01810 Encounter for preprocedural cardiovascular examination: Secondary | ICD-10-CM | POA: Diagnosis not present

## 2023-08-18 DIAGNOSIS — M810 Age-related osteoporosis without current pathological fracture: Secondary | ICD-10-CM

## 2023-08-18 DIAGNOSIS — Z1329 Encounter for screening for other suspected endocrine disorder: Secondary | ICD-10-CM

## 2023-08-18 DIAGNOSIS — Z13220 Encounter for screening for lipoid disorders: Secondary | ICD-10-CM

## 2023-08-18 DIAGNOSIS — Z7952 Long term (current) use of systemic steroids: Secondary | ICD-10-CM | POA: Diagnosis not present

## 2023-08-18 DIAGNOSIS — Z131 Encounter for screening for diabetes mellitus: Secondary | ICD-10-CM | POA: Diagnosis not present

## 2023-08-18 MED ORDER — CLOTRIMAZOLE 10 MG MT TROC: 10.0000 mg | Freq: Every day | OROMUCOSAL | 0 refills | Status: DC

## 2023-08-18 NOTE — Patient Instructions (Signed)
I will be in touch with your labs and will reach out to your pulmonologist as well to make sure you are all set for your operation

## 2023-08-19 ENCOUNTER — Encounter: Payer: Self-pay | Admitting: Family Medicine

## 2023-08-19 LAB — LIPID PANEL
Cholesterol: 157 mg/dL (ref 0–200)
HDL: 44.2 mg/dL (ref >39.00)
LDL Cholesterol: 91 mg/dL (ref 0–99)
NonHDL: 112.86
Total CHOL/HDL Ratio: 4
Triglycerides: 108 mg/dL (ref 0.0–149.0)
VLDL: 21.6 mg/dL (ref 0.0–40.0)

## 2023-08-19 LAB — HEMOGLOBIN A1C: Hgb A1c MFr Bld: 5.3 % (ref 4.6–6.5)

## 2023-08-19 LAB — TSH: TSH: 1.36 u[IU]/mL (ref 0.35–5.50)

## 2023-08-20 MED ORDER — CLOTRIMAZOLE 10 MG MT TROC: 10.0000 mg | Freq: Every day | OROMUCOSAL | 0 refills | Status: AC

## 2023-08-20 NOTE — Addendum Note (Signed)
Addended by: Abbe Amsterdam C on: 08/20/2023 12:30 PM   Modules accepted: Orders

## 2023-08-22 ENCOUNTER — Encounter: Payer: Self-pay | Admitting: Pulmonary Disease

## 2023-08-22 ENCOUNTER — Telehealth: Payer: Self-pay | Admitting: Pulmonary Disease

## 2023-08-22 NOTE — Telephone Encounter (Signed)
Fax received from Dr. Francena Hanly with [facility] to perform a left shoulder arthroplasty under general anesthesia on patient.  Patient needs surgery clearance. Surgery is pending clearance. Patient was seen on 10/29/23. Office protocol is a risk assessment can be sent to surgeon if patient has been seen in 60 days or less.   Pt needs appt for risk assessment to be done   I scheduled her for PFT 09/12/23 and ov with Dewald 11/05/23

## 2023-08-26 ENCOUNTER — Telehealth: Payer: Self-pay | Admitting: Pulmonary Disease

## 2023-08-26 NOTE — Telephone Encounter (Signed)
Dr Francine Graven- please advise on pt email   She is scheduled for PFT 09/12/23 and appt with you on 11/03/22   I have nothing sooner to offer    I took a fall down my stairs almost 2 years ago.  The pain in my shoulder has gotten progressively worse!   In the meantime, I've been doing the tests you requested I do with Dr Ebony Cargo at [facility].   I had an endoscopy with endoflip in September and I was put to sleep for that.  I am scheduled for a monometry study January 23. Because Dr Rennis Chris reached out to Dr Patsy Lager and my [facility] doctors for clearance, your team has contacted me and insist that I come back in to see you beforehand. You are not available until Feb 4.  I did schedule a PFT Dec 13 at your office.  Please clear me for this.  I am in constant pain and am not allowed anything but Tylenol which is also not good for me and my liver. I'm in the process of completing everything you asked for, I've been put to sleep once already during these studies, I've been cleared by other doctors, and I'm in constant pain.  PLEASE don't let this hold me up.  I have bone spurs and a torn tendon from that fall.  I need this repair ASAP. Thanks Alicia [name]  DOB 2061-04-05

## 2023-08-26 NOTE — Telephone Encounter (Signed)
Please send to Dr. Rennis Chris at [facility] for her shoulder surgery.  ARISCAT Score for Postoperative Pulmonary Complications  Low to Intermediate risk, 1.6% - 13.3% risk of in-hospital post-op pulmonary complications (composite including respiratory failure, respiratory infection, pleural effusion, atelectasis, pneumothorax, bronchospasm, aspiration pneumonitis).  Dr. Francine Graven

## 2023-08-27 ENCOUNTER — Other Ambulatory Visit: Payer: Self-pay | Admitting: Family Medicine

## 2023-08-27 ENCOUNTER — Telehealth (HOSPITAL_BASED_OUTPATIENT_CLINIC_OR_DEPARTMENT_OTHER): Payer: Self-pay

## 2023-08-27 ENCOUNTER — Encounter: Payer: Self-pay | Admitting: Family Medicine

## 2023-08-27 NOTE — Telephone Encounter (Signed)
Copy of risk assessment dated 08/26/23 was faxed to Dr Rennis Chris at [phone].

## 2023-08-27 NOTE — Telephone Encounter (Signed)
Risk assessment note from 08/26/23 was faxed to Dr Rennis Chris

## 2023-08-27 NOTE — Telephone Encounter (Signed)
Done- copy of this note faxed to Dr Rennis Chris at [phone].

## 2023-09-12 ENCOUNTER — Ambulatory Visit: Payer: Medicare Other | Admitting: Pulmonary Disease

## 2023-09-12 DIAGNOSIS — J4550 Severe persistent asthma, uncomplicated: Secondary | ICD-10-CM | POA: Diagnosis not present

## 2023-09-12 DIAGNOSIS — D869 Sarcoidosis, unspecified: Secondary | ICD-10-CM

## 2023-09-12 LAB — PULMONARY FUNCTION TEST
DL/VA % pred: 106 %
DL/VA: 4.49 ml/min/mmHg/L
DLCO cor % pred: 80 %
DLCO cor: 15.43 ml/min/mmHg
DLCO unc % pred: 80 %
DLCO unc: 15.43 ml/min/mmHg
FEF 25-75 Post: 0.67 L/s
FEF 25-75 Pre: 0.61 L/s
FEF2575-%Change-Post: 8 %
FEF2575-%Pred-Post: 31 %
FEF2575-%Pred-Pre: 29 %
FEV1-%Change-Post: 3 %
FEV1-%Pred-Post: 52 %
FEV1-%Pred-Pre: 50 %
FEV1-Post: 1.24 L
FEV1-Pre: 1.2 L
FEV1FVC-%Change-Post: 0 %
FEV1FVC-%Pred-Pre: 75 %
FEV6-%Change-Post: 3 %
FEV6-%Pred-Post: 71 %
FEV6-%Pred-Pre: 69 %
FEV6-Post: 2.11 L
FEV6-Pre: 2.04 L
FEV6FVC-%Pred-Post: 104 %
FEV6FVC-%Pred-Pre: 104 %
FVC-%Change-Post: 2 %
FVC-%Pred-Post: 68 %
FVC-%Pred-Pre: 66 %
FVC-Post: 2.11 L
FVC-Pre: 2.05 L
Post FEV1/FVC ratio: 59 %
Post FEV6/FVC ratio: 100 %
Pre FEV1/FVC ratio: 58 %
Pre FEV6/FVC Ratio: 100 %
RV % pred: 178 %
RV: 3.61 L
TLC % pred: 117 %
TLC: 5.78 L

## 2023-09-12 NOTE — Progress Notes (Signed)
Full PFT performed today. °

## 2023-09-12 NOTE — Patient Instructions (Signed)
Full PFT performed today. °

## 2023-10-03 DIAGNOSIS — I158 Other secondary hypertension: Secondary | ICD-10-CM | POA: Diagnosis not present

## 2023-10-03 DIAGNOSIS — N184 Chronic kidney disease, stage 4 (severe): Secondary | ICD-10-CM | POA: Diagnosis not present

## 2023-10-03 DIAGNOSIS — Z949 Transplanted organ and tissue status, unspecified: Secondary | ICD-10-CM | POA: Diagnosis not present

## 2023-10-03 DIAGNOSIS — Z48298 Encounter for aftercare following other organ transplant: Secondary | ICD-10-CM | POA: Diagnosis not present

## 2023-10-03 DIAGNOSIS — D849 Immunodeficiency, unspecified: Secondary | ICD-10-CM | POA: Diagnosis not present

## 2023-10-03 DIAGNOSIS — Z944 Liver transplant status: Secondary | ICD-10-CM | POA: Diagnosis not present

## 2023-10-07 DIAGNOSIS — X58XXXA Exposure to other specified factors, initial encounter: Secondary | ICD-10-CM | POA: Diagnosis not present

## 2023-10-07 DIAGNOSIS — M7542 Impingement syndrome of left shoulder: Secondary | ICD-10-CM | POA: Diagnosis not present

## 2023-10-07 DIAGNOSIS — I890 Lymphedema, not elsewhere classified: Secondary | ICD-10-CM | POA: Diagnosis not present

## 2023-10-07 DIAGNOSIS — Y999 Unspecified external cause status: Secondary | ICD-10-CM | POA: Diagnosis not present

## 2023-10-07 DIAGNOSIS — M24612 Ankylosis, left shoulder: Secondary | ICD-10-CM | POA: Diagnosis not present

## 2023-10-07 DIAGNOSIS — M65812 Other synovitis and tenosynovitis, left shoulder: Secondary | ICD-10-CM | POA: Diagnosis not present

## 2023-10-07 DIAGNOSIS — M7552 Bursitis of left shoulder: Secondary | ICD-10-CM | POA: Diagnosis not present

## 2023-10-07 DIAGNOSIS — M94212 Chondromalacia, left shoulder: Secondary | ICD-10-CM | POA: Diagnosis not present

## 2023-10-07 DIAGNOSIS — M65912 Unspecified synovitis and tenosynovitis, left shoulder: Secondary | ICD-10-CM | POA: Diagnosis not present

## 2023-10-07 DIAGNOSIS — G8918 Other acute postprocedural pain: Secondary | ICD-10-CM | POA: Diagnosis not present

## 2023-10-07 DIAGNOSIS — S46012A Strain of muscle(s) and tendon(s) of the rotator cuff of left shoulder, initial encounter: Secondary | ICD-10-CM | POA: Diagnosis not present

## 2023-10-07 DIAGNOSIS — S43432A Superior glenoid labrum lesion of left shoulder, initial encounter: Secondary | ICD-10-CM | POA: Diagnosis not present

## 2023-10-07 DIAGNOSIS — M19012 Primary osteoarthritis, left shoulder: Secondary | ICD-10-CM | POA: Diagnosis not present

## 2023-10-07 DIAGNOSIS — M75112 Incomplete rotator cuff tear or rupture of left shoulder, not specified as traumatic: Secondary | ICD-10-CM | POA: Diagnosis not present

## 2023-10-07 DIAGNOSIS — M25512 Pain in left shoulder: Secondary | ICD-10-CM | POA: Diagnosis not present

## 2023-10-07 DIAGNOSIS — Z4789 Encounter for other orthopedic aftercare: Secondary | ICD-10-CM | POA: Diagnosis not present

## 2023-10-13 NOTE — Therapy (Signed)
 OUTPATIENT PHYSICAL THERAPY SHOULDER EVALUATION   Patient Name: Alicia [name] MRN: [id number] DOB:1960/12/11, 63 y.o., female Today's Date: 10/14/2023  END OF SESSION:  PT End of Session - 10/14/23 0946     Visit Number 1    Date for PT Re-Evaluation 01/06/24    PT Start Time 0845    PT Stop Time 0920    PT Time Calculation (min) 35 min    Activity Tolerance Patient limited by pain;Patient tolerated treatment well    Behavior During Therapy [facility] for tasks assessed/performed             Past Medical History:  Diagnosis Date   Allergy    Anemia    Asthma    Cellulitis    Cirrhosis (HCC)    GERD (gastroesophageal reflux disease)    Hepatic fibrosis    congenital   History of UTI    chronic as a child   Sarcoidosis    Spleen enlarged    Past Surgical History:  Procedure Laterality Date   COLONOSCOPY     CYST EXCISION Left 05/01/2017   Procedure: LEFT INDEX FINGER CYST EXCISION;  Surgeon: Jerri Kay HERO, MD;  Location: [facility] OR;  Service: Orthopedics;  Laterality: Left;   ESOPHAGOGASTRODUODENOSCOPY     LIVER TRANSPLANT     SPINAL FUSION  12/2007   Double spinal Fusion   VIDEO BRONCHOSCOPY Bilateral 12/08/2015   Procedure: VIDEO BRONCHOSCOPY WITH FLUORO;  Surgeon: Harden Jude GAILS, MD;  Location: [facility] ENDOSCOPY;  Service: Cardiopulmonary;  Laterality: Bilateral;   Patient Active Problem List   Diagnosis Date Noted   Osteoporosis without current pathological fracture 05/25/2018   Status post liver transplant (HCC) 12/25/2017   Syncope 07/17/2017   Other cirrhosis of liver (HCC) 05/10/2017   Immunocompromised patient (HCC) 05/10/2017   Mucous cyst of digit of left hand 04/11/2017   Cellulitis, abdominal wall 03/01/2017   Normocytic anemia 03/01/2017   Hypokalemia 03/01/2017   CKD (chronic kidney disease), stage III (HCC) 03/01/2017   Abdominal wall cellulitis 03/01/2017   Adrenal insufficiency (HCC) 03/01/2017   Hyperammonemia (HCC)    Cellulitis 06/28/2016    Thrombocytopenia (HCC) 06/28/2016   AKI (acute kidney injury) (HCC) 06/28/2016   Multiple pulmonary nodules determined by computed tomography of lung    Chronic cough 09/07/2015   Hypertension, portal (HCC) 09/04/2011   Asthma with chronic obstructive pulmonary disease (COPD) (HCC) 09/04/2011   Vocal cord nodules 07/04/2011   GERD (gastroesophageal reflux disease) 07/04/2011   Sarcoidosis 07/01/2011   Asthma, severe persistent, with upper airway instability 06/13/2011   Congenital hepatic fibrosis 07/03/2000    PCP: Copland, Harlene BROCKS, MD  REFERRING PROVIDER: Melita Drivers, MD   REFERRING DIAG: M75.42 Lt Shoulder Impingement   THERAPY DIAG:  S/P left rotator cuff repair  Muscle weakness (generalized)  Stiffness of left shoulder, not elsewhere classified  Impingement of left shoulder  Rationale for Evaluation and Treatment: Rehabilitation  ONSET DATE: 09/15/24  SUBJECTIVE:  SUBJECTIVE STATEMENT: Patient reports that she fell about 2 years ago, injuring her shoulder. She tried rehabbing it, but was unsuccessful. Further assessment revealed a major tear in RC. She had surgical repair on 10/07/23. Hand dominance: Right  PERTINENT HISTORY: Per referral:PT 1 x/week x 4 weeks for gentle PROM only, S/P L SA, Deb, SAD, RCR, DOS 10/07/23 Per referring physician note: Clemens in 2023, injured L shoulder. The pain never improved, has gotten worse. PMHx: Hepaptic Fibrosis, liver transplant 64yrs ago Chronic kidney disease  Sarcoidosis Double spinal fusion C4-5 Osteoporosis  Fall on 12/05/21  PAIN:  Are you having pain? Yes: NPRS scale: Patient denies much pain as she has been immobilized. Pain location: L shoulder Pain description: N/A Aggravating factors: Unknown. Has been totally immobilized Relieving  factors: muscle relaxers and Tylenol  , ice machine  PRECAUTIONS: Shoulder, Fall, and Other: Liver transplant, CKD  RED FLAGS: None   WEIGHT BEARING RESTRICTIONS: Yes NWB L  FALLS:  Has patient fallen in last 6 months? No  LIVING ENVIRONMENT: Lives with: lives with their family Lives in: House/apartment Stairs: Yes: Internal: 14 steps; on right going up Has following equipment at home: Walker - 2 wheeled  OCCUPATION: N/A since her liver transplant, She loves to garden and does chair yoga, walking, hiking, camping.  PLOF: Independent  PATIENT GOALS:Patient would like to return to her normal daily activities, including gardening.  NEXT MD VISIT:   OBJECTIVE:  Note: Objective measures were completed at Evaluation unless otherwise noted.  DIAGNOSTIC FINDINGS:  Not obtained.  COGNITION: Overall cognitive status: Within functional limits for tasks assessed     SENSATION: Not tested  POSTURE: Upright posture, L scapula sits in slight elevation and add.  UPPER EXTREMITY ROM: L elbow, wrist and hand all WNL  Passive ROM Right eval Left eval  Shoulder flexion  58  Shoulder extension  20  Shoulder abduction  46  Shoulder adduction    Shoulder internal rotation  At least 60  Shoulder external rotation  neutral  Elbow flexion    Elbow extension    Wrist flexion    Wrist extension    Wrist ulnar deviation    Wrist radial deviation    Wrist pronation    Wrist supination    (Blank rows = not tested)  UPPER EXTREMITY MMT: L shoulder deferred, L elbow, wrist and hand at least 3/5.   SHOULDER SPECIAL TESTS: deferred  JOINT MOBILITY TESTING:  deferred  PALPATION:  Patient has increased tension in B UT, TTP and guarded all around L shoulder.                                                                                                                             TREATMENT DATE:  10/14/23 Education, ROM Deep breathing techniques to facilitate relaxation of L  upper quadrant.  PATIENT EDUCATION: Education details: POC Person educated: Patient Education method: Explanation Education comprehension: verbalized understanding  HOME EXERCISE PROGRAM:  [id number]  ASSESSMENT:  CLINICAL IMPRESSION: Patient is a 63 y.o. who was seen today for physical therapy evaluation and treatment S/P L shoulder SA, deb., SAD, RCR, DOS by Dr Melita on 10/07/23. Her initial orders call for gentle ROM to L shoulder 1x/week x 4 weeks. Frequency and activities will progress per protocol for her rehab. She is currently very stiff and guarded throughout her L upper quadrant. Initiated some gentle PROM for shoulder and educated her to perform AROM for her L elbow, wrist, and hand. She will greatly benefit from PT to help her progress through her rehab as she has a goal to be able to return to her previous active lifestyle which includes gardening among other hobbies.  OBJECTIVE IMPAIRMENTS: decreased activity tolerance, decreased coordination, decreased mobility, decreased ROM, decreased strength, increased muscle spasms, impaired flexibility, impaired UE functional use, postural dysfunction, and pain.   ACTIVITY LIMITATIONS: carrying, lifting, bed mobility, bathing, toileting, dressing, reach over head, and hygiene/grooming  PARTICIPATION LIMITATIONS: meal prep, cleaning, laundry, driving, shopping, and community activity  PERSONAL FACTORS: Past/current experiences are also affecting patient's functional outcome.   REHAB POTENTIAL: Good  CLINICAL DECISION MAKING: Evolving/moderate complexity  EVALUATION COMPLEXITY: Low   GOALS: Goals reviewed with patient? Yes  SHORT TERM GOALS: Target date: 11/05/23  I with initial HEP Baseline: Goal status: INITIAL  LONG TERM GOALS: Target date: 01/06/24  I with final HEP Baseline:  Goal status: INITIAL  2.  Patient will recover full active L shoulder ROM in all planes of movement Baseline:  Goal status: INITIAL  3.   Increase L shoulder strength to 5/5 in all planes. Baseline:  Goal status: INITIAL  4.  Patient will be able to use LUE as normal when working in her garden including pulling weeds, hoeing, etc. Baseline:  Goal status: INITIAL  5.  Patient will report pain < 3/10 with all of her daily activities. Baseline:  Goal status: INITIAL  PLAN:  PT FREQUENCY: 1-2x/week  PT DURATION: 12 weeks  PLANNED INTERVENTIONS: 97110-Therapeutic exercises, 97530- Therapeutic activity, [id number]- Neuromuscular re-education, 97535- Self Care, [id number]- Manual therapy, 97016- Vasopneumatic device, 97033- Ionotophoresis 4mg /ml Dexamethasone, Patient/Family education, Taping, Joint mobilization, Cryotherapy, and Moist heat  PLAN FOR NEXT SESSION: Follow protocol and Dr instructions.   Devere CHRISTELLA Mean, DPT 10/14/2023, 9:51 AM

## 2023-10-14 ENCOUNTER — Ambulatory Visit: Payer: Medicare Other | Attending: Orthopedic Surgery | Admitting: Physical Therapy

## 2023-10-14 ENCOUNTER — Encounter: Payer: Self-pay | Admitting: Physical Therapy

## 2023-10-14 DIAGNOSIS — M6281 Muscle weakness (generalized): Secondary | ICD-10-CM | POA: Insufficient documentation

## 2023-10-14 DIAGNOSIS — M25812 Other specified joint disorders, left shoulder: Secondary | ICD-10-CM | POA: Insufficient documentation

## 2023-10-14 DIAGNOSIS — Z9889 Other specified postprocedural states: Secondary | ICD-10-CM | POA: Diagnosis not present

## 2023-10-14 DIAGNOSIS — M25612 Stiffness of left shoulder, not elsewhere classified: Secondary | ICD-10-CM | POA: Insufficient documentation

## 2023-10-17 ENCOUNTER — Encounter: Payer: Self-pay | Admitting: Family Medicine

## 2023-10-17 DIAGNOSIS — Z944 Liver transplant status: Secondary | ICD-10-CM

## 2023-10-18 ENCOUNTER — Encounter: Payer: Self-pay | Admitting: Family Medicine

## 2023-10-18 DIAGNOSIS — N183 Chronic kidney disease, stage 3 unspecified: Secondary | ICD-10-CM

## 2023-10-21 ENCOUNTER — Encounter: Payer: Self-pay | Admitting: Physical Therapy

## 2023-10-21 ENCOUNTER — Ambulatory Visit: Payer: Medicare Other | Admitting: Physical Therapy

## 2023-10-21 DIAGNOSIS — M25812 Other specified joint disorders, left shoulder: Secondary | ICD-10-CM

## 2023-10-21 DIAGNOSIS — Z9889 Other specified postprocedural states: Secondary | ICD-10-CM | POA: Diagnosis not present

## 2023-10-21 DIAGNOSIS — M25612 Stiffness of left shoulder, not elsewhere classified: Secondary | ICD-10-CM | POA: Diagnosis not present

## 2023-10-21 DIAGNOSIS — M6281 Muscle weakness (generalized): Secondary | ICD-10-CM

## 2023-10-21 NOTE — Therapy (Signed)
OUTPATIENT PHYSICAL THERAPY SHOULDER EVALUATION   Patient Name: Alicia [name] MRN: [id number] DOB:1961-05-22, 63 y.o., female Today's Date: 10/21/2023  END OF SESSION:  PT End of Session - 10/21/23 0926     Visit Number 2    Date for PT Re-Evaluation 01/06/24    PT Start Time 0845    PT Stop Time 0918    PT Time Calculation (min) 33 min    Activity Tolerance Patient limited by pain;Patient tolerated treatment well    Behavior During Therapy [facility] for tasks assessed/performed              Past Medical History:  Diagnosis Date   Allergy    Anemia    Asthma    Cellulitis    Cirrhosis (HCC)    GERD (gastroesophageal reflux disease)    Hepatic fibrosis    congenital   History of UTI    chronic as a child   Sarcoidosis    Spleen enlarged    Past Surgical History:  Procedure Laterality Date   COLONOSCOPY     CYST EXCISION Left 05/01/2017   Procedure: LEFT INDEX FINGER CYST EXCISION;  Surgeon: Tarry Kos, MD;  Location: [facility] OR;  Service: Orthopedics;  Laterality: Left;   ESOPHAGOGASTRODUODENOSCOPY     LIVER TRANSPLANT     SPINAL FUSION  12/2007   Double spinal Fusion   VIDEO BRONCHOSCOPY Bilateral 12/08/2015   Procedure: VIDEO BRONCHOSCOPY WITH FLUORO;  Surgeon: Oretha Milch, MD;  Location: [facility] ENDOSCOPY;  Service: Cardiopulmonary;  Laterality: Bilateral;   Patient Active Problem List   Diagnosis Date Noted   Osteoporosis without current pathological fracture 05/25/2018   Status post liver transplant (HCC) 12/25/2017   Syncope 07/17/2017   Other cirrhosis of liver (HCC) 05/10/2017   Immunocompromised patient (HCC) 05/10/2017   Mucous cyst of digit of left hand 04/11/2017   Cellulitis, abdominal wall 03/01/2017   Normocytic anemia 03/01/2017   Hypokalemia 03/01/2017   CKD (chronic kidney disease), stage III (HCC) 03/01/2017   Abdominal wall cellulitis 03/01/2017   Adrenal insufficiency (HCC) 03/01/2017   Hyperammonemia (HCC)    Cellulitis 06/28/2016    Thrombocytopenia (HCC) 06/28/2016   AKI (acute kidney injury) (HCC) 06/28/2016   Multiple pulmonary nodules determined by computed tomography of lung    Chronic cough 09/07/2015   Hypertension, portal (HCC) 09/04/2011   Asthma with chronic obstructive pulmonary disease (COPD) (HCC) 09/04/2011   Vocal cord nodules 07/04/2011   GERD (gastroesophageal reflux disease) 07/04/2011   Sarcoidosis 07/01/2011   Asthma, severe persistent, with upper airway instability 06/13/2011   Congenital hepatic fibrosis 07/03/2000    PCP: Copland, Gwenlyn Found, MD  REFERRING PROVIDER: Francena Hanly, MD   REFERRING DIAG: M75.42 Lt Shoulder Impingement   THERAPY DIAG:  S/P left rotator cuff repair  Muscle weakness (generalized)  Impingement of left shoulder  Stiffness of left shoulder, not elsewhere classified  Rationale for Evaluation and Treatment: Rehabilitation  ONSET DATE: 09/15/24  SUBJECTIVE:  SUBJECTIVE STATEMENT: Patient saw Dr who told her 4 more weeks of gentle ROM after extensive RCR with Sub acromial debridement. HEP is going well.  Patient reports that she fell about 2 years ago, injuring her shoulder. She tried [place] it, but was unsuccessful. Further assessment revealed a major tear in RC. She had surgical repair on 10/07/23. Hand dominance: Right  PERTINENT HISTORY: Per referral:PT 1 x/week x 4 weeks for gentle PROM only, S/P L SA, Deb, SAD, RCR, DOS 10/07/23 Per referring physician note: Larey Seat in 2023, injured L shoulder. The pain never improved, has gotten worse. PMHx: Hepaptic Fibrosis, liver transplant 19yrs ago Chronic kidney disease  Sarcoidosis Double spinal fusion C4-5 Osteoporosis  Fall on 12/05/21  PAIN:  Are you having pain? Yes: NPRS scale: Patient denies much pain as she has been  immobilized. Pain location: L shoulder Pain description: N/A Aggravating factors: Unknown. Has been totally immobilized Relieving factors: muscle relaxers and Tylenol , ice machine  PRECAUTIONS: Shoulder, Fall, and Other: Liver transplant, CKD  RED FLAGS: None   WEIGHT BEARING RESTRICTIONS: Yes NWB L  FALLS:  Has patient fallen in last 6 months? No  LIVING ENVIRONMENT: Lives with: lives with their family Lives in: House/apartment Stairs: Yes: Internal: 14 steps; on right going up Has following equipment at home: Walker - 2 wheeled  OCCUPATION: N/A since her liver transplant, She loves to garden and does chair yoga, walking, hiking, camping.  PLOF: Independent  PATIENT GOALS:Patient would like to return to her normal daily activities, including gardening.  NEXT MD VISIT:   OBJECTIVE:  Note: Objective measures were completed at Evaluation unless otherwise noted.  DIAGNOSTIC FINDINGS:  Not obtained.  COGNITION: Overall cognitive status: Within functional limits for tasks assessed     SENSATION: Not tested  POSTURE: Upright posture, L scapula sits in slight elevation and add.  UPPER EXTREMITY ROM: L elbow, wrist and hand all WNL  Passive ROM Right eval Left eval  Shoulder flexion  58  Shoulder extension  20  Shoulder abduction  46  Shoulder adduction    Shoulder internal rotation  At least 60  Shoulder external rotation  neutral  Elbow flexion    Elbow extension    Wrist flexion    Wrist extension    Wrist ulnar deviation    Wrist radial deviation    Wrist pronation    Wrist supination    (Blank rows = not tested)  UPPER EXTREMITY MMT: L shoulder deferred, L elbow, wrist and hand at least 3/5.   SHOULDER SPECIAL TESTS: deferred  JOINT MOBILITY TESTING:  deferred  PALPATION:  Patient has increased tension in B UT, TTP and guarded all around L shoulder.                                                                                                                              TREATMENT DATE:  10/21/23 Scapular retractions, shoulder circles x 10 each direction PROM for shoulder,  flex, ext, abd, IR/ER, all very gentle within tolerance. Pendulum exercises forward/back, side to side, circles in B directions x 10 each, required frequent VC for relaxation. L elbow, wrist and hand AROM. Patient declined ice as she has a machine at home.  10/14/23 Education, ROM Deep breathing techniques to facilitate relaxation of L upper quadrant.  PATIENT EDUCATION: Education details: POC Person educated: Patient Education method: Explanation Education comprehension: verbalized understanding  HOME EXERCISE PROGRAM:  [id number]  ASSESSMENT:  CLINICAL IMPRESSION: Patient is a 63 y.o. who was seen today for physical therapy evaluation and treatment S/P L shoulder SA, deb., SAD, RCR, DOS by Dr Rennis Chris on 10/07/23. She returned to see him and was told to continue gentle PROM 1x/week until at least 11/19/23, when she returns to see him. Followed protocol and Dr instructions with gentle PROM for L shoulder, AROM for L elbow, wrist, and hand. Mild C/O pain at end ranges, stopped at initiation of pain.  OBJECTIVE IMPAIRMENTS: decreased activity tolerance, decreased coordination, decreased mobility, decreased ROM, decreased strength, increased muscle spasms, impaired flexibility, impaired UE functional use, postural dysfunction, and pain.   ACTIVITY LIMITATIONS: carrying, lifting, bed mobility, bathing, toileting, dressing, reach over head, and hygiene/grooming  PARTICIPATION LIMITATIONS: meal prep, cleaning, laundry, driving, shopping, and community activity  PERSONAL FACTORS: Past/current experiences are also affecting patient's functional outcome.   REHAB POTENTIAL: Good  CLINICAL DECISION MAKING: Evolving/moderate complexity  EVALUATION COMPLEXITY: Low   GOALS: Goals reviewed with patient? Yes  SHORT TERM GOALS: Target date: 11/05/23  I with  initial HEP Baseline: Goal status: 10/21/23-met  LONG TERM GOALS: Target date: 01/06/24  I with final HEP Baseline:  Goal status: INITIAL  2.  Patient will recover full active L shoulder ROM in all planes of movement Baseline:  Goal status: INITIAL  3.  Increase L shoulder strength to 5/5 in all planes. Baseline:  Goal status: INITIAL  4.  Patient will be able to use LUE as normal when working in her garden including pulling weeds, hoeing, etc. Baseline:  Goal status: INITIAL  5.  Patient will report pain < 3/10 with all of her daily activities. Baseline:  Goal status: INITIAL  PLAN:  PT FREQUENCY: 1-2x/week  PT DURATION: 12 weeks  PLANNED INTERVENTIONS: 97110-Therapeutic exercises, 97530- Therapeutic activity, [id number]- Neuromuscular re-education, 97535- Self Care, [id number]- Manual therapy, 97016- Vasopneumatic device, [id number]- Ionotophoresis 4mg /ml Dexamethasone, Patient/Family education, Taping, Joint mobilization, Cryotherapy, and Moist heat  PLAN FOR NEXT SESSION: Follow protocol and Dr instructions.   Iona Beard, DPT 10/21/2023, 9:28 AM

## 2023-10-23 ENCOUNTER — Other Ambulatory Visit (INDEPENDENT_AMBULATORY_CARE_PROVIDER_SITE_OTHER): Payer: Medicare Other

## 2023-10-23 DIAGNOSIS — Z944 Liver transplant status: Secondary | ICD-10-CM | POA: Diagnosis not present

## 2023-10-23 LAB — COMPREHENSIVE METABOLIC PANEL
ALT: 7 U/L (ref 0–35)
AST: 9 U/L (ref 0–37)
Albumin: 4.1 g/dL (ref 3.5–5.2)
Alkaline Phosphatase: 62 U/L (ref 39–117)
BUN: 17 mg/dL (ref 6–23)
CO2: 24 meq/L (ref 19–32)
Calcium: 8.6 mg/dL (ref 8.4–10.5)
Chloride: 107 meq/L (ref 96–112)
Creatinine, Ser: 1.7 mg/dL — ABNORMAL HIGH (ref 0.40–1.20)
GFR: 31.4 mL/min — ABNORMAL LOW (ref >60.00)
Glucose, Bld: 123 mg/dL — ABNORMAL HIGH (ref 70–99)
Potassium: 4.5 meq/L (ref 3.5–5.1)
Sodium: 139 meq/L (ref 135–145)
Total Bilirubin: 0.6 mg/dL (ref 0.2–1.2)
Total Protein: 6.1 g/dL (ref 6.0–8.3)

## 2023-10-23 LAB — CBC WITH DIFFERENTIAL/PLATELET
Basophils Absolute: 0 10*3/uL (ref 0.0–0.1)
Basophils Relative: 0.7 % (ref 0.0–3.0)
Eosinophils Absolute: 0.1 10*3/uL (ref 0.0–0.7)
Eosinophils Relative: 1 % (ref 0.0–5.0)
HCT: 34.2 % — ABNORMAL LOW (ref 36.0–46.0)
Hemoglobin: 11.7 g/dL — ABNORMAL LOW (ref 12.0–15.0)
Lymphocytes Relative: 22 % (ref 12.0–46.0)
Lymphs Abs: 1.2 10*3/uL (ref 0.7–4.0)
MCHC: 34.2 g/dL (ref 30.0–36.0)
MCV: 92.4 fL (ref 78.0–100.0)
Monocytes Absolute: 0.4 10*3/uL (ref 0.1–1.0)
Monocytes Relative: 8 % (ref 3.0–12.0)
Neutro Abs: 3.6 10*3/uL (ref 1.4–7.7)
Neutrophils Relative %: 68.3 % (ref 43.0–77.0)
Platelets: 197 10*3/uL (ref 150.0–400.0)
RBC: 3.7 Mil/uL — ABNORMAL LOW (ref 3.87–5.11)
RDW: 13.7 % (ref 11.5–15.5)
WBC: 5.3 10*3/uL (ref 4.0–10.5)

## 2023-10-23 LAB — CORTISOL: Cortisol, Plasma: 10.6 ug/dL

## 2023-10-27 LAB — ACTH: C206 ACTH: 34 pg/mL (ref 6–50)

## 2023-10-27 LAB — TACROLIMUS,HIGHLY SENSITIVE,LC/MS/MS: Tacrolimus Lvl: 4.8 ug/L — ABNORMAL LOW

## 2023-10-28 ENCOUNTER — Encounter: Payer: Self-pay | Admitting: Family Medicine

## 2023-10-28 ENCOUNTER — Encounter: Payer: Self-pay | Admitting: Physical Therapy

## 2023-10-28 ENCOUNTER — Ambulatory Visit: Payer: Medicare Other | Admitting: Physical Therapy

## 2023-10-28 DIAGNOSIS — Z9889 Other specified postprocedural states: Secondary | ICD-10-CM

## 2023-10-28 DIAGNOSIS — M25812 Other specified joint disorders, left shoulder: Secondary | ICD-10-CM

## 2023-10-28 DIAGNOSIS — M25612 Stiffness of left shoulder, not elsewhere classified: Secondary | ICD-10-CM | POA: Diagnosis not present

## 2023-10-28 DIAGNOSIS — M6281 Muscle weakness (generalized): Secondary | ICD-10-CM

## 2023-10-28 NOTE — Therapy (Signed)
OUTPATIENT PHYSICAL THERAPY SHOULDER EVALUATION   Patient Name: Alicia [name] MRN: [id number] DOB:[birth year], 63 y.o., female Today's Date: 10/28/2023  END OF SESSION:  PT End of Session - 10/28/23 0846     Visit Number 3    Date for PT Re-Evaluation 01/06/24    PT Start Time 0845    PT Stop Time 0930    PT Time Calculation (min) 45 min    Activity Tolerance Patient limited by pain;Patient tolerated treatment well    Behavior During Therapy [facility] for tasks assessed/performed              Past Medical History:  Diagnosis Date   Allergy    Anemia    Asthma    Cellulitis    Cirrhosis (HCC)    GERD (gastroesophageal reflux disease)    Hepatic fibrosis    congenital   History of UTI    chronic as a child   Sarcoidosis    Spleen enlarged    Past Surgical History:  Procedure Laterality Date   COLONOSCOPY     CYST EXCISION Left 05/01/2017   Procedure: LEFT INDEX FINGER CYST EXCISION;  Surgeon: Tarry Kos, MD;  Location: [facility] OR;  Service: Orthopedics;  Laterality: Left;   ESOPHAGOGASTRODUODENOSCOPY     LIVER TRANSPLANT     SPINAL FUSION  12/2007   Double spinal Fusion   VIDEO BRONCHOSCOPY Bilateral 12/08/2015   Procedure: VIDEO BRONCHOSCOPY WITH FLUORO;  Surgeon: Oretha Milch, MD;  Location: [facility] ENDOSCOPY;  Service: Cardiopulmonary;  Laterality: Bilateral;   Patient Active Problem List   Diagnosis Date Noted   Osteoporosis without current pathological fracture 05/25/2018   Status post liver transplant (HCC) 12/25/2017   Syncope 07/17/2017   Other cirrhosis of liver (HCC) 05/10/2017   Immunocompromised patient (HCC) 05/10/2017   Mucous cyst of digit of left hand 04/11/2017   Cellulitis, abdominal wall 03/01/2017   Normocytic anemia 03/01/2017   Hypokalemia 03/01/2017   CKD (chronic kidney disease), stage III (HCC) 03/01/2017   Abdominal wall cellulitis 03/01/2017   Adrenal insufficiency (HCC) 03/01/2017   Hyperammonemia (HCC)    Cellulitis 06/28/2016    Thrombocytopenia (HCC) 06/28/2016   AKI (acute kidney injury) (HCC) 06/28/2016   Multiple pulmonary nodules determined by computed tomography of lung    Chronic cough 09/07/2015   Hypertension, portal (HCC) 09/04/2011   Asthma with chronic obstructive pulmonary disease (COPD) (HCC) 09/04/2011   Vocal cord nodules 07/04/2011   GERD (gastroesophageal reflux disease) 07/04/2011   Sarcoidosis 07/01/2011   Asthma, severe persistent, with upper airway instability 06/13/2011   Congenital hepatic fibrosis 07/03/2000    PCP: Copland, Gwenlyn Found, MD  REFERRING PROVIDER: Francena Hanly, MD   REFERRING DIAG: M75.42 Lt Shoulder Impingement   THERAPY DIAG:  S/P left rotator cuff repair  Impingement of left shoulder  Stiffness of left shoulder, not elsewhere classified  Muscle weakness (generalized)  Rationale for Evaluation and Treatment: Rehabilitation  ONSET DATE: 09/15/24  SUBJECTIVE:  SUBJECTIVE STATEMENT: No pain like she was having, this is more sore  Patient reports that she fell about 2 years ago, injuring her shoulder. She tried [place] it, but was unsuccessful. Further assessment revealed a major tear in RC. She had surgical repair on 10/07/23. Hand dominance: Right  PERTINENT HISTORY: Per referral:PT 1 x/week x 4 weeks for gentle PROM only, S/P L SA, Deb, SAD, RCR, DOS 10/07/23 Per referring physician note: Larey Seat in 2023, injured L shoulder. The pain never improved, has gotten worse. PMHx: Hepaptic Fibrosis, liver transplant 70yrs ago Chronic kidney disease  Sarcoidosis Double spinal fusion C4-5 Osteoporosis  Fall on 12/05/21  PAIN:  Are you having pain? Yes: NPRS scale: Patient denies much pain as she has been immobilized. Pain location: L shoulder Pain description: sore Aggravating factors:  Unknown. Has been totally immobilized Relieving factors: muscle relaxers and Tylenol , ice machine  PRECAUTIONS: Shoulder, Fall, and Other: Liver transplant, CKD  RED FLAGS: None   WEIGHT BEARING RESTRICTIONS: Yes NWB L  FALLS:  Has patient fallen in last 6 months? No  LIVING ENVIRONMENT: Lives with: lives with their family Lives in: House/apartment Stairs: Yes: Internal: 14 steps; on right going up Has following equipment at home: Walker - 2 wheeled  OCCUPATION: N/A since her liver transplant, She loves to garden and does chair yoga, walking, hiking, camping.  PLOF: Independent  PATIENT GOALS:Patient would like to return to her normal daily activities, including gardening.  NEXT MD VISIT:   OBJECTIVE:  Note: Objective measures were completed at Evaluation unless otherwise noted.  DIAGNOSTIC FINDINGS:  Not obtained.  COGNITION: Overall cognitive status: Within functional limits for tasks assessed     SENSATION: Not tested  POSTURE: Upright posture, L scapula sits in slight elevation and add.  UPPER EXTREMITY ROM: L elbow, wrist and hand all WNL  Passive ROM Right eval Left eval  Shoulder flexion  58  Shoulder extension  20  Shoulder abduction  46  Shoulder adduction    Shoulder internal rotation  At least 60  Shoulder external rotation  neutral  Elbow flexion    Elbow extension    Wrist flexion    Wrist extension    Wrist ulnar deviation    Wrist radial deviation    Wrist pronation    Wrist supination    (Blank rows = not tested)  UPPER EXTREMITY MMT: L shoulder deferred, L elbow, wrist and hand at least 3/5.   SHOULDER SPECIAL TESTS: deferred  JOINT MOBILITY TESTING:  deferred  PALPATION:  Patient has increased tension in B UT, TTP and guarded all around L shoulder.                                                                                                                             TREATMENT DATE:  10/28/23 Scapular retraction   Shrugs  Supine Shoulder Protraction  PROM for shoulder, flex, ext, abd, IR/ER, all very gentle within tolerance.  10/21/23 Scapular  retractions, shoulder circles x 10 each direction PROM for shoulder, flex, ext, abd, IR/ER, all very gentle within tolerance. Pendulum exercises forward/back, side to side, circles in B directions x 10 each, required frequent VC for relaxation. L elbow, wrist and hand AROM. Patient declined ice as she has a machine at home.  10/14/23 Education, ROM Deep breathing techniques to facilitate relaxation of L upper quadrant.  PATIENT EDUCATION: Education details: POC Person educated: Patient Education method: Explanation Education comprehension: verbalized understanding  HOME EXERCISE PROGRAM:  [id number]  ASSESSMENT:  CLINICAL IMPRESSION: Patient is a 63 y.o. who was seen today for physical therapy evaluation and treatment S/P L shoulder SA, deb., SAD, RCR, DOS by Dr Rennis Chris on 10/07/23. She returned to see him and was told to continue gentle PROM 1x/week until at least 11/19/23, when she returns to see him. Continued to followed protocol and Dr instructions with gentle PROM for L shoulder, AROM for L elbow, wrist, and hand. Mild C/O pain at end ranges, stopped at initiation of pain.  OBJECTIVE IMPAIRMENTS: decreased activity tolerance, decreased coordination, decreased mobility, decreased ROM, decreased strength, increased muscle spasms, impaired flexibility, impaired UE functional use, postural dysfunction, and pain.   ACTIVITY LIMITATIONS: carrying, lifting, bed mobility, bathing, toileting, dressing, reach over head, and hygiene/grooming  PARTICIPATION LIMITATIONS: meal prep, cleaning, laundry, driving, shopping, and community activity  PERSONAL FACTORS: Past/current experiences are also affecting patient's functional outcome.   REHAB POTENTIAL: Good  CLINICAL DECISION MAKING: Evolving/moderate complexity  EVALUATION COMPLEXITY: Low   GOALS: Goals  reviewed with patient? Yes  SHORT TERM GOALS: Target date: 11/05/23  I with initial HEP Baseline: Goal status: 10/21/23-met  LONG TERM GOALS: Target date: 01/06/24  I with final HEP Baseline:  Goal status: INITIAL  2.  Patient will recover full active L shoulder ROM in all planes of movement Baseline:  Goal status: INITIAL  3.  Increase L shoulder strength to 5/5 in all planes. Baseline:  Goal status: INITIAL  4.  Patient will be able to use LUE as normal when working in her garden including pulling weeds, hoeing, etc. Baseline:  Goal status: INITIAL  5.  Patient will report pain < 3/10 with all of her daily activities. Baseline:  Goal status: INITIAL  PLAN:  PT FREQUENCY: 1-2x/week  PT DURATION: 12 weeks  PLANNED INTERVENTIONS: 97110-Therapeutic exercises, 97530- Therapeutic activity, [id number]- Neuromuscular re-education, 97535- Self Care, [id number]- Manual therapy, [id number]- Vasopneumatic device, 97033- Ionotophoresis 4mg /ml Dexamethasone, Patient/Family education, Taping, Joint mobilization, Cryotherapy, and Moist heat  PLAN FOR NEXT SESSION: Follow protocol and Dr instructions.   Debroah Baller, PTA 10/28/2023, 8:47 AM

## 2023-11-04 ENCOUNTER — Encounter: Payer: Self-pay | Admitting: Pulmonary Disease

## 2023-11-04 ENCOUNTER — Ambulatory Visit: Payer: Medicare Other | Admitting: Pulmonary Disease

## 2023-11-04 VITALS — BP 140/70 | HR 91 | Ht 62.0 in | Wt 149.6 lb

## 2023-11-04 DIAGNOSIS — R053 Chronic cough: Secondary | ICD-10-CM | POA: Diagnosis not present

## 2023-11-04 DIAGNOSIS — J4489 Other specified chronic obstructive pulmonary disease: Secondary | ICD-10-CM

## 2023-11-04 DIAGNOSIS — K219 Gastro-esophageal reflux disease without esophagitis: Secondary | ICD-10-CM | POA: Diagnosis not present

## 2023-11-04 DIAGNOSIS — D869 Sarcoidosis, unspecified: Secondary | ICD-10-CM | POA: Diagnosis not present

## 2023-11-04 NOTE — Patient Instructions (Addendum)
 Continue budesonide  nebs twice daily  Continue brovana  nebs twice daily  Continue ipratropium nebs every 6 hours  Continue ipratropium nasal spray twice daily  Continue Xyzal daily for allergies  Follow up with your GI team regarding the reflux  Follow up in 6 months, in person or virtual

## 2023-11-04 NOTE — Progress Notes (Signed)
 Synopsis: Referred in February 2023 for Sarcoidosis by Harlene Schroeder, MD  Subjective:   PATIENT ID: Alicia [name] GENDER: female DOB: [birth year], MRN: [id number]  HPI  Chief Complaint  Patient presents with   Follow-up    Pt is complaining of violent coughing w/ sob. Uses rescue albuterol  inhaler and takes a while to kick in. Phlegm w/ coughing after eating   Alicia [name] is a 63 year old woman, never smoker with GERD, congenital hepatic fibrosis s/p liver transplant, CKD III, chronic cough and asthma who returns for sarcoidosis.   She recently had rotator cuff repair surgery on her left shoulder and is doing well so far.   She continues to have cough with sporadic/violent coughing episodes. She had modified barium swallow 10/2022 which showed no frank aspiration risk and recommended further evaluation of the esophagus. She had esophogram done at [facility] 01/2023 which showed mild esophageal dysmotility with esophagolaryngeal reflux, small hiatal hernia and penetration of the upper airway with multiple contrast bolus during pharyngoesophageal reflux episode. She has a manometry test pending.   She is doing well with budesonide , brovana  and ipratropium nebs. She is using xyzal daily for allergies and ipratropium nasal spray as needed for post-nasal drip.  OV 10/28/22 She was started on yupelri  along with brovana  and budesonide  nebs after last visit with some improvement in her cough symptoms. She has been on duonebs and budesonide  since as insurance does not cover yupelri .  Her cough feels worse after eating and drinking. She reports coughing and sneezing with eating and drinking. She feels sometimes food can get stuck in her chest. She reports history of food allergies in the past and questions whether those have returned.   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   Initial OV 11/02/21 She has been followed at the [facility] pulmonary clinic by Dr. Alvia and most recently by Dr. Volanda for her asthma and history of sarcoidosis, and wishes to establish care locally.  Records reviewed in care everywhere.  She was recently transitioned from inhaler therapy to long-acting nebulizer treatments with improvement in her breathing and coughing episodes.  She still reports having 6-8 coughing fits per day.  She also experiences intermittent wheezing.  She is currently taking budesonide  and Brovana .  She has as needed albuterol  inhaler which does provide relief.  She was diagnosed with sarcoidosis in 2017 via transbronchial biopsies from various segments of the right lower lobe.  CT chest scan from 11/2020 shows peribronchovascular and perifissural nodularity bilaterally with similar distribution as before with some enlargement of various nodules.  Right apical nodule is 6 mm.  Right lower lobe subpleural pulmonary nodule is 5 mm.  There is new bibasilar scarring and mild volume loss.  She tries to remain as active as possible.  She enjoys gardening in her backyard.  She is accompanied by her husband today.  Past Medical History:  Diagnosis Date   Allergy    Anemia    Asthma    Cellulitis    Cirrhosis (HCC)    GERD (gastroesophageal reflux disease)    Hepatic fibrosis    congenital   History of UTI    chronic as a child   Sarcoidosis    Spleen enlarged      Family History  Problem Relation Age of Onset   Arthritis Mother    Heart disease Father  Triple bypass   Migraines Father    Heart attack Paternal Grandmother    Stroke Paternal Grandfather    Liver disease Sister        Juvenile cysitc liver disease   Asthma Sister      Social History   Socioeconomic History   Marital status: Married    Spouse name: Not on file   Number of children: Not on file   Years of education: Not on file   Highest education level: Not on file   Occupational History   Occupation: HR Manager    Employer: [employer]  Tobacco Use   Smoking status: Never   Smokeless tobacco: Never  Substance and Sexual Activity   Alcohol use: No   Drug use: No   Sexual activity: Not on file  Other Topics Concern   Not on file  Social History Narrative   Not on file   Social Drivers of Health   Financial Resource Strain: Patient Declined (08/14/2023)   Received from [facility]   Overall Financial Resource Strain (CARDIA)    Difficulty of Paying Living Expenses: Patient declined  Food Insecurity: Patient Declined (08/14/2023)   Received from [facility]   Hunger Vital Sign    Worried About Running Out of Food in the Last Year: Patient declined    Ran Out of Food in the Last Year: Patient declined  Transportation Needs: Patient Declined (08/14/2023)   Received from [facility]   PRAPARE - Transportation    In the past 12 months, has lack of transportation kept you from medical appointments or from getting medications?: Patient declined    Lack of Transportation (Non-Medical): Patient declined  Physical Activity: Insufficiently Active (08/16/2021)   Exercise Vital Sign    Days of Exercise per Week: 3 days    Minutes of Exercise per Session: 30 min  Stress: No Stress Concern Present (08/16/2021)   [employer] of Occupational Health - Occupational Stress Questionnaire    Feeling of Stress : Not at all  Social Connections: Socially Integrated (08/16/2021)   Social Connection and Isolation Panel [NHANES]    Frequency of Communication with Friends and Family: More than three times a week    Frequency of Social Gatherings with Friends and Family: More than three times a week    Attends Religious Services: 1 to 4 times per year    Active Member of [employer] or Organizations: Yes    Attends Banker Meetings: More than 4 times per year    Marital Status: Married   Catering Manager Violence: Not At Risk (08/20/2022)   Humiliation, Afraid, Rape, and Kick questionnaire    Fear of Current or Ex-Partner: No    Emotionally Abused: No    Physically Abused: No    Sexually Abused: No     Allergies  Allergen Reactions   Aspirin Other (See Comments)    Causes Asthma   Peanut-Containing Drug Products Shortness Of Breath    Asthma, eye swelling, itchy eyes, can't catch her breath   Gabapentin Other (See Comments)    Blisters in mouth, nausea    Statins     Asthma, eye swelling, itchy eyes, can't catch her breath   Azithromycin Nausea And Vomiting     Outpatient Medications Prior to Visit  Medication Sig Dispense Refill   albuterol  (VENTOLIN  HFA) 108 (90 Base) MCG/ACT inhaler INHALE 2 PUFFS INTO THE LUNGS EVERY 6 HOURS AS NEEDED FOR WHEEZING OR SHORTNESS OF  BREATH 6.7 g 3   arformoterol  (BROVANA ) 15 MCG/2ML NEBU Substituted for: Brovana  Neb Solution Inhale one vial in nebulizer twice a day. 2 mL 11   budesonide  (PULMICORT ) 0.25 MG/2ML nebulizer solution Generic for Pulmicort . Inhale one vial in nebulizer twice a day. Rinse mouth after use. 60 mL 11   clotrimazole  (MYCELEX ) 10 MG troche Take 1 tablet (10 mg total) by mouth 5 (five) times daily. Allow to dissolve slowly 35 Troche 0   ipratropium (ATROVENT ) 0.02 % nebulizer solution Take 2.5 mLs (0.5 mg total) by nebulization every 6 (six) hours. 360 mL 12   ipratropium (ATROVENT ) 0.03 % nasal spray Place 2 sprays into both nostrils every 12 (twelve) hours. 30 mL 12   levocetirizine (XYZAL) 2.5 MG/5ML solution Take 2.5 mg by mouth every evening.     losartan (COZAAR) 25 MG tablet Take 25 mg by mouth daily.     pantoprazole  (PROTONIX ) 40 MG tablet Take 40 mg by mouth daily.     tacrolimus  (PROGRAF ) 1 MG capsule Take 1 mg by mouth 2 (two) times a day. TAKE 2 TABLETS (3MG  TOTAL) IN THE MORNING AND TWO  TABLETS (2MG  TOTAL) IN THE EVENING.     UNABLE TO FIND 25 mg. Med Name: Verapamil 25 mg daily     verapamil  (CALAN-SR) 120 MG CR tablet Take 120 mg by mouth daily.     calcium  citrate-vitamin D (CITRACAL+D) 315-200 MG-UNIT tablet Take 1 tablet by mouth 2 (two) times daily. (Patient not taking: Reported on 11/04/2023)     predniSONE  (DELTASONE ) 20 MG tablet Take 1 tablet (20 mg total) by mouth daily with breakfast. (Patient not taking: Reported on 11/04/2023) 5 tablet 0   triamcinolone  (NASACORT  ALLERGY 24HR) 55 MCG/ACT AERO nasal inhaler Place 2 sprays into the nose daily. (Patient not taking: Reported on 11/04/2023) 1 each 12   No facility-administered medications prior to visit.   Review of Systems  Constitutional:  Negative for chills, fever, malaise/fatigue and weight loss.  HENT:  Negative for congestion, sinus pain and sore throat.   Eyes: Negative.   Respiratory:  Positive for cough, shortness of breath and wheezing. Negative for hemoptysis and sputum production.   Cardiovascular:  Negative for chest pain, palpitations, orthopnea, claudication and leg swelling.  Gastrointestinal:  Positive for heartburn. Negative for abdominal pain, nausea and vomiting.  Genitourinary: Negative.   Musculoskeletal:  Negative for joint pain and myalgias.  Skin:  Negative for rash.  Neurological:  Negative for weakness.  Endo/Heme/Allergies: Negative.   Psychiatric/Behavioral: Negative.      Objective:   Vitals:   11/04/23 0833  BP: (!) 140/70  Pulse: 91  SpO2: 99%  Weight: 149 lb 9.6 oz (67.9 kg)  Height: 5' 2 (1.575 m)    Physical Exam Constitutional:      General: She is not in acute distress.    Appearance: She is not ill-appearing.  HENT:     Head: Normocephalic and atraumatic.  Eyes:     General: No scleral icterus.    Conjunctiva/sclera: Conjunctivae normal.  Cardiovascular:     Rate and Rhythm: Normal rate and regular rhythm.     Pulses: Normal pulses.     Heart sounds: Normal heart sounds. No murmur heard. Pulmonary:     Effort: Pulmonary effort is normal.     Breath sounds: Normal  breath sounds. No wheezing, rhonchi or rales.  Musculoskeletal:     Right lower leg: No edema.     Left lower leg: No edema.  Skin:    General: Skin is warm and dry.  Neurological:     General: No focal deficit present.     Mental Status: She is alert.    CBC    Component Value Date/Time   WBC 5.3 10/23/2023 0857   RBC 3.70 (L) 10/23/2023 0857   HGB 11.7 (L) 10/23/2023 0857   HGB 11.1 09/21/2018 1149   HCT 34.2 (L) 10/23/2023 0857   HCT 31.2 (L) 09/21/2018 1149   PLT 197.0 10/23/2023 0857   PLT 162 09/21/2018 1149   MCV 92.4 10/23/2023 0857   MCV 87 09/21/2018 1149   MCH 31.0 07/12/2020 0820   MCHC 34.2 10/23/2023 0857   RDW 13.7 10/23/2023 0857   RDW 13.7 09/21/2018 1149   LYMPHSABS 1.2 10/23/2023 0857   LYMPHSABS 1.1 09/21/2018 1149   MONOABS 0.4 10/23/2023 0857   EOSABS 0.1 10/23/2023 0857   EOSABS 0.1 09/21/2018 1149   BASOSABS 0.0 10/23/2023 0857   BASOSABS 0.0 09/21/2018 1149      Latest Ref Rng & Units 10/23/2023    8:57 AM 07/04/2023    9:10 AM 03/26/2023    8:15 AM  BMP  Glucose 70 - 99 mg/dL 876  890  884   BUN 6 - 23 mg/dL 17  15  24    Creatinine 0.40 - 1.20 mg/dL 8.29  8.25  8.15   Sodium 135 - 145 mEq/L 139  140  139   Potassium 3.5 - 5.1 mEq/L 4.5  4.5  4.6   Chloride 96 - 112 mEq/L 107  108  108   CO2 19 - 32 mEq/L 24  24  24    Calcium  8.4 - 10.5 mg/dL 8.6  8.8  9.4    Chest imaging: HRCT Chest 05/16/22 1. Innumerable solid pulmonary nodules scattered throughout both lungs, generally peribronchovascular in distribution, stable to minimally increased since 11/30/2020 chest CT, compatible with pulmonary sarcoidosis. 2. No evidence of interstitial lung disease. No compelling evidence of tracheobronchomalacia. 3. Stable chronic small pericardial effusion/thickening. 4. Similar chronic moderate to large lower thoracic esophageal and proximal perigastric varices. 5. Aortic Atherosclerosis  CT Chest wo contrast 11/30/20 1. Peri lymphatic distribution  pulmonary nodules, likely related to the clinical history of sarcoidosis. Some nodules have increased in size since 2018, suggesting progression of inflammation. 2. No acute superimposed process. 3. Cirrhosis and portal venous hypertension with gastroesophageal varices, as before. 4.  Aortic Atherosclerosis  PFT:    Latest Ref Rng & Units 09/12/2023   12:36 PM 03/20/2016    1:55 PM  PFT Results  FVC-Pre L 2.05  2.23   FVC-Predicted Pre % 66  68   FVC-Post L 2.11  2.53   FVC-Predicted Post % 68  77   Pre FEV1/FVC % % 58  58   Post FEV1/FCV % % 59  60   FEV1-Pre L 1.20  1.29   FEV1-Predicted Pre % 50  51   FEV1-Post L 1.24  1.52   DLCO uncorrected ml/min/mmHg 15.43  18.50   DLCO UNC% % 80  80   DLCO corrected ml/min/mmHg 15.43    DLCO COR %Predicted % 80    DLVA Predicted % 106  97   TLC L 5.78  8.53   TLC % Predicted % 117  174   RV % Predicted % 178  293   PFT 2017: Moderately severe obstruction, gas trapping present, normal diffusion PFT 2024: Moderately severe obstruction, gas trapping present, normal diffusion Labs:  Path:  Echo:  Heart Catheterization:  Assessment & Plan:   Asthma-COPD overlap syndrome (HCC)  Sarcoidosis  Chronic cough  Gastroesophageal reflux disease without esophagitis  Discussion: Alicia [name] is a 63 year old woman, never smoker with GERD, cirrhosis s/p liver transplant, chronic cough and asthma who returns for sarcoidosis.   Asthma-COPD Overlap Syndrome - she is to continue budesonide , brovana  and yupelri  nebs - as needed albuterol  - GERD is a major aggravator of her respiratory symptoms  Gastroesophageal Reflux Disease (GERD) Chronic cough and hoarseness due to GERD. Patient has a history of nodules on the voice box and a small hiatal hernia. Patient is scheduled for esophageal manometry. -Continue Protonix  twice daily. - Follow up esophageal manomatry results from later this year.  Sarcoidosis Stable with no signs of  active disease.  -Continue current management.  Allergic Rhinitis Controlled with Xyzal and ipratropium bromide  nasal spray. -Continue Xyzal and ipratropium bromide  nasal spray.  Follow-up in 6 months   Dorn Chill, MD [facility] Office: [phone]   Current Outpatient Medications:    albuterol  (VENTOLIN  HFA) 108 (90 Base) MCG/ACT inhaler, INHALE 2 PUFFS INTO THE LUNGS EVERY 6 HOURS AS NEEDED FOR WHEEZING OR SHORTNESS OF BREATH, Disp: 6.7 g, Rfl: 3   arformoterol  (BROVANA ) 15 MCG/2ML NEBU, Substituted for: Brovana  Neb Solution Inhale one vial in nebulizer twice a day., Disp: 2 mL, Rfl: 11   budesonide  (PULMICORT ) 0.25 MG/2ML nebulizer solution, Generic for Pulmicort . Inhale one vial in nebulizer twice a day. Rinse mouth after use., Disp: 60 mL, Rfl: 11   clotrimazole  (MYCELEX ) 10 MG troche, Take 1 tablet (10 mg total) by mouth 5 (five) times daily. Allow to dissolve slowly, Disp: 35 Troche, Rfl: 0   ipratropium (ATROVENT ) 0.02 % nebulizer solution, Take 2.5 mLs (0.5 mg total) by nebulization every 6 (six) hours., Disp: 360 mL, Rfl: 12   ipratropium (ATROVENT ) 0.03 % nasal spray, Place 2 sprays into both nostrils every 12 (twelve) hours., Disp: 30 mL, Rfl: 12   levocetirizine (XYZAL) 2.5 MG/5ML solution, Take 2.5 mg by mouth every evening., Disp: , Rfl:    losartan (COZAAR) 25 MG tablet, Take 25 mg by mouth daily., Disp: , Rfl:    pantoprazole  (PROTONIX ) 40 MG tablet, Take 40 mg by mouth daily., Disp: , Rfl:    tacrolimus  (PROGRAF ) 1 MG capsule, Take 1 mg by mouth 2 (two) times a day. TAKE 2 TABLETS (3MG  TOTAL) IN THE MORNING AND TWO  TABLETS (2MG  TOTAL) IN THE EVENING., Disp: , Rfl:    UNABLE TO FIND, 25 mg. Med Name: Verapamil 25 mg daily, Disp: , Rfl:    verapamil (CALAN-SR) 120 MG CR tablet, Take 120 mg by mouth daily., Disp: , Rfl:

## 2023-11-05 ENCOUNTER — Encounter: Payer: Self-pay | Admitting: Physical Therapy

## 2023-11-05 ENCOUNTER — Ambulatory Visit: Payer: Medicare Other | Attending: Orthopedic Surgery | Admitting: Physical Therapy

## 2023-11-05 DIAGNOSIS — M25612 Stiffness of left shoulder, not elsewhere classified: Secondary | ICD-10-CM | POA: Diagnosis not present

## 2023-11-05 DIAGNOSIS — M25812 Other specified joint disorders, left shoulder: Secondary | ICD-10-CM | POA: Insufficient documentation

## 2023-11-05 DIAGNOSIS — M6281 Muscle weakness (generalized): Secondary | ICD-10-CM | POA: Diagnosis not present

## 2023-11-05 DIAGNOSIS — Z9889 Other specified postprocedural states: Secondary | ICD-10-CM | POA: Diagnosis not present

## 2023-11-05 NOTE — Therapy (Signed)
 OUTPATIENT PHYSICAL THERAPY SHOULDER EVALUATION   Patient Name: Alicia [name] MRN: [id number] DOB:[birth year], 63 y.o., female Today's Date: 11/05/2023  END OF SESSION:  PT End of Session - 11/05/23 0845     Visit Number 4    Date for PT Re-Evaluation 01/06/24    PT Start Time 0845    PT Stop Time 0930    PT Time Calculation (min) 45 min    Activity Tolerance Patient limited by pain;Patient tolerated treatment well    Behavior During Therapy [facility] for tasks assessed/performed              Past Medical History:  Diagnosis Date   Allergy    Anemia    Asthma    Cellulitis    Cirrhosis (HCC)    GERD (gastroesophageal reflux disease)    Hepatic fibrosis    congenital   History of UTI    chronic as a child   Sarcoidosis    Spleen enlarged    Past Surgical History:  Procedure Laterality Date   COLONOSCOPY     CYST EXCISION Left 05/01/2017   Procedure: LEFT INDEX FINGER CYST EXCISION;  Surgeon: Jerri Kay HERO, MD;  Location: [facility] OR;  Service: Orthopedics;  Laterality: Left;   ESOPHAGOGASTRODUODENOSCOPY     LIVER TRANSPLANT     SPINAL FUSION  12/2007   Double spinal Fusion   VIDEO BRONCHOSCOPY Bilateral 12/08/2015   Procedure: VIDEO BRONCHOSCOPY WITH FLUORO;  Surgeon: Harden Jude GAILS, MD;  Location: [facility] ENDOSCOPY;  Service: Cardiopulmonary;  Laterality: Bilateral;   Patient Active Problem List   Diagnosis Date Noted   Osteoporosis without current pathological fracture 05/25/2018   Status post liver transplant (HCC) 12/25/2017   Syncope 07/17/2017   Other cirrhosis of liver (HCC) 05/10/2017   Immunocompromised patient (HCC) 05/10/2017   Mucous cyst of digit of left hand 04/11/2017   Cellulitis, abdominal wall 03/01/2017   Normocytic anemia 03/01/2017   Hypokalemia 03/01/2017   CKD (chronic kidney disease), stage III (HCC) 03/01/2017   Abdominal wall cellulitis 03/01/2017   Adrenal insufficiency (HCC) 03/01/2017   Hyperammonemia (HCC)    Cellulitis 06/28/2016    Thrombocytopenia (HCC) 06/28/2016   AKI (acute kidney injury) (HCC) 06/28/2016   Multiple pulmonary nodules determined by computed tomography of lung    Chronic cough 09/07/2015   Hypertension, portal (HCC) 09/04/2011   Asthma with chronic obstructive pulmonary disease (COPD) (HCC) 09/04/2011   Vocal cord nodules 07/04/2011   GERD (gastroesophageal reflux disease) 07/04/2011   Sarcoidosis 07/01/2011   Asthma, severe persistent, with upper airway instability 06/13/2011   Congenital hepatic fibrosis 07/03/2000    PCP: Copland, Harlene BROCKS, MD  REFERRING PROVIDER: Melita Drivers, MD   REFERRING DIAG: M75.42 Lt Shoulder Impingement   THERAPY DIAG:  S/P left rotator cuff repair  Impingement of left shoulder  Stiffness of left shoulder, not elsewhere classified  Muscle weakness (generalized)  Rationale for Evaluation and Treatment: Rehabilitation  ONSET DATE: 10/07/23  SUBJECTIVE:  SUBJECTIVE STATEMENT: Good, Good  Patient reports that she fell about 2 years ago, injuring her shoulder. She tried rehabbing it, but was unsuccessful. Further assessment revealed a major tear in RC. She had surgical repair on 10/07/23. Hand dominance: Right  PERTINENT HISTORY: Per referral:PT 1 x/week x 4 weeks for gentle PROM only, S/P L SA, Deb, SAD, RCR, DOS 10/07/23 Per referring physician note: Clemens in 2023, injured L shoulder. The pain never improved, has gotten worse. PMHx: Hepaptic Fibrosis, liver transplant 70yrs ago Chronic kidney disease  Sarcoidosis Double spinal fusion C4-5 Osteoporosis  Fall on 12/05/21  PAIN:  Are you having pain? Yes: NPRS scale: Patient denies much pain as she has been immobilized. Pain location: L shoulder Pain description: sore Aggravating factors: Unknown. Has been totally  immobilized Relieving factors: muscle relaxers and Tylenol  , ice machine  PRECAUTIONS: Shoulder, Fall, and Other: Liver transplant, CKD  RED FLAGS: None   WEIGHT BEARING RESTRICTIONS: Yes NWB L  FALLS:  Has patient fallen in last 6 months? No  LIVING ENVIRONMENT: Lives with: lives with their family Lives in: House/apartment Stairs: Yes: Internal: 14 steps; on right going up Has following equipment at home: Walker - 2 wheeled  OCCUPATION: N/A since her liver transplant, She loves to garden and does chair yoga, walking, hiking, camping.  PLOF: Independent  PATIENT GOALS:Patient would like to return to her normal daily activities, including gardening.  NEXT MD VISIT: 11/19/23  OBJECTIVE:  Note: Objective measures were completed at Evaluation unless otherwise noted.  DIAGNOSTIC FINDINGS:  Not obtained.  COGNITION: Overall cognitive status: Within functional limits for tasks assessed     SENSATION: Not tested  POSTURE: Upright posture, L scapula sits in slight elevation and add.  UPPER EXTREMITY ROM: L elbow, wrist and hand all WNL  Passive ROM Right eval Left eval  Shoulder flexion  58  Shoulder extension  20  Shoulder abduction  46  Shoulder adduction    Shoulder internal rotation  At least 60  Shoulder external rotation  neutral  Elbow flexion    Elbow extension    Wrist flexion    Wrist extension    Wrist ulnar deviation    Wrist radial deviation    Wrist pronation    Wrist supination    (Blank rows = not tested)  UPPER EXTREMITY MMT: L shoulder deferred, L elbow, wrist and hand at least 3/5.   SHOULDER SPECIAL TESTS: deferred  JOINT MOBILITY TESTING:  deferred  PALPATION:  Patient has increased tension in B UT, TTP and guarded all around L shoulder.                                                                                                                             TREATMENT DATE:  11/05/23 Shrugs 2x10 Scapular retractions 2x10 Elbow  Flex and Ext w/ pronation & supination  PROM w/ end range holds for shoulder, flex, ext, abd, IR/ER, all very gentle within tolerance RUE Isometric  for 5x3'' flex, Ext, ER, and IR  10/28/23 Scapular retraction  Shrugs  Supine Shoulder Protraction  PROM for shoulder, flex, ext, abd, IR/ER, all very gentle within tolerance.  10/21/23 Scapular retractions, shoulder circles x 10 each direction PROM for shoulder, flex, ext, abd, IR/ER, all very gentle within tolerance. Pendulum exercises forward/back, side to side, circles in B directions x 10 each, required frequent VC for relaxation. L elbow, wrist and hand AROM. Patient declined ice as she has a machine at home.  10/14/23 Education, ROM Deep breathing techniques to facilitate relaxation of L upper quadrant.  PATIENT EDUCATION: Education details: POC Person educated: Patient Education method: Explanation Education comprehension: verbalized understanding  HOME EXERCISE PROGRAM:  [id number]  ASSESSMENT:  CLINICAL IMPRESSION: Patient is a 63 y.o. who was seen today for physical therapy evaluation and treatment S/P L shoulder SA, deb., SAD, RCR, DOS by Dr Melita on 10/07/23. She returned to see him and was told to continue gentle PROM 1x/week until at least 11/19/23, when she returns to see him. Continued to followed protocol and Dr instructions with gentle PROM for L shoulder, AROM for L elbow, wrist, and hand. Mild C/O pain at end ranges, stopped at initiation of pain. Added LUE isometrics without issue  OBJECTIVE IMPAIRMENTS: decreased activity tolerance, decreased coordination, decreased mobility, decreased ROM, decreased strength, increased muscle spasms, impaired flexibility, impaired UE functional use, postural dysfunction, and pain.   ACTIVITY LIMITATIONS: carrying, lifting, bed mobility, bathing, toileting, dressing, reach over head, and hygiene/grooming  PARTICIPATION LIMITATIONS: meal prep, cleaning, laundry, driving, shopping,  and community activity  PERSONAL FACTORS: Past/current experiences are also affecting patient's functional outcome.   REHAB POTENTIAL: Good  CLINICAL DECISION MAKING: Evolving/moderate complexity  EVALUATION COMPLEXITY: Low   GOALS: Goals reviewed with patient? Yes  SHORT TERM GOALS: Target date: 11/05/23  I with initial HEP Baseline: Goal status: 10/21/23-met  LONG TERM GOALS: Target date: 01/06/24  I with final HEP Baseline:  Goal status: INITIAL  2.  Patient will recover full active L shoulder ROM in all planes of movement Baseline:  Goal status: INITIAL  3.  Increase L shoulder strength to 5/5 in all planes. Baseline:  Goal status: INITIAL  4.  Patient will be able to use LUE as normal when working in her garden including pulling weeds, hoeing, etc. Baseline:  Goal status: INITIAL  5.  Patient will report pain < 3/10 with all of her daily activities. Baseline:  Goal status: INITIAL  PLAN:  PT FREQUENCY: 1-2x/week  PT DURATION: 12 weeks  PLANNED INTERVENTIONS: 97110-Therapeutic exercises, 97530- Therapeutic activity, [id number]- Neuromuscular re-education, 97535- Self Care, [id number]- Manual therapy, 97016- Vasopneumatic device, 97033- Ionotophoresis 4mg /ml Dexamethasone, Patient/Family education, Taping, Joint mobilization, Cryotherapy, and Moist heat  PLAN FOR NEXT SESSION: Follow protocol and Dr instructions.   Tanda Sorrow, PTA 11/05/2023, 8:46 AM

## 2023-11-07 ENCOUNTER — Encounter: Payer: Self-pay | Admitting: Pulmonary Disease

## 2023-11-11 ENCOUNTER — Ambulatory Visit: Payer: Medicare Other | Admitting: Physical Therapy

## 2023-11-11 ENCOUNTER — Encounter: Payer: Self-pay | Admitting: Physical Therapy

## 2023-11-11 ENCOUNTER — Other Ambulatory Visit: Payer: Self-pay | Admitting: Pulmonary Disease

## 2023-11-11 DIAGNOSIS — M6281 Muscle weakness (generalized): Secondary | ICD-10-CM | POA: Diagnosis not present

## 2023-11-11 DIAGNOSIS — Z9889 Other specified postprocedural states: Secondary | ICD-10-CM

## 2023-11-11 DIAGNOSIS — M25812 Other specified joint disorders, left shoulder: Secondary | ICD-10-CM

## 2023-11-11 DIAGNOSIS — M25612 Stiffness of left shoulder, not elsewhere classified: Secondary | ICD-10-CM | POA: Diagnosis not present

## 2023-11-11 NOTE — Therapy (Signed)
OUTPATIENT PHYSICAL THERAPY SHOULDER TREATMENT    Patient Name: Alicia [name] MRN: [id number] DOB:27-Feb-1961, 63 y.o., female Today's Date: 11/11/2023  END OF SESSION:  PT End of Session - 11/11/23 0905     Visit Number 5    Date for PT Re-Evaluation 01/06/24    PT Start Time 0847    PT Stop Time 0927    PT Time Calculation (min) 40 min    Activity Tolerance Patient limited by pain;Patient tolerated treatment well    Behavior During Therapy [facility] for tasks assessed/performed               Past Medical History:  Diagnosis Date   Allergy    Anemia    Asthma    Cellulitis    Cirrhosis (HCC)    GERD (gastroesophageal reflux disease)    Hepatic fibrosis    congenital   History of UTI    chronic as a child   Sarcoidosis    Spleen enlarged    Past Surgical History:  Procedure Laterality Date   COLONOSCOPY     CYST EXCISION Left 05/01/2017   Procedure: LEFT INDEX FINGER CYST EXCISION;  Surgeon: Tarry Kos, MD;  Location: [facility] OR;  Service: Orthopedics;  Laterality: Left;   ESOPHAGOGASTRODUODENOSCOPY     LIVER TRANSPLANT     SPINAL FUSION  12/2007   Double spinal Fusion   VIDEO BRONCHOSCOPY Bilateral 12/08/2015   Procedure: VIDEO BRONCHOSCOPY WITH FLUORO;  Surgeon: Oretha Milch, MD;  Location: [facility] ENDOSCOPY;  Service: Cardiopulmonary;  Laterality: Bilateral;   Patient Active Problem List   Diagnosis Date Noted   Osteoporosis without current pathological fracture 05/25/2018   Status post liver transplant (HCC) 12/25/2017   Syncope 07/17/2017   Other cirrhosis of liver (HCC) 05/10/2017   Immunocompromised patient (HCC) 05/10/2017   Mucous cyst of digit of left hand 04/11/2017   Cellulitis, abdominal wall 03/01/2017   Normocytic anemia 03/01/2017   Hypokalemia 03/01/2017   CKD (chronic kidney disease), stage III (HCC) 03/01/2017   Abdominal wall cellulitis 03/01/2017   Adrenal insufficiency (HCC) 03/01/2017   Hyperammonemia (HCC)    Cellulitis 06/28/2016    Thrombocytopenia (HCC) 06/28/2016   AKI (acute kidney injury) (HCC) 06/28/2016   Multiple pulmonary nodules determined by computed tomography of lung    Chronic cough 09/07/2015   Hypertension, portal (HCC) 09/04/2011   Asthma with chronic obstructive pulmonary disease (COPD) (HCC) 09/04/2011   Vocal cord nodules 07/04/2011   GERD (gastroesophageal reflux disease) 07/04/2011   Sarcoidosis 07/01/2011   Asthma, severe persistent, with upper airway instability 06/13/2011   Congenital hepatic fibrosis 07/03/2000    PCP: Copland, Gwenlyn Found, MD  REFERRING PROVIDER: Francena Hanly, MD   REFERRING DIAG: M75.42 Lt Shoulder Impingement   THERAPY DIAG:  S/P left rotator cuff repair  Impingement of left shoulder  Stiffness of left shoulder, not elsewhere classified  Muscle weakness (generalized)  Rationale for Evaluation and Treatment: Rehabilitation  ONSET DATE: 10/07/23  SUBJECTIVE:  SUBJECTIVE STATEMENT:  Really tired of the sling, I see the MD on the 19th and hoping that he will take me out of it. He's said in the past that after the next MD visit that I may get to increase to 2x/week in PT.       EVAL: Patient reports that she fell about 2 years ago, injuring her shoulder. She tried [place] it, but was unsuccessful. Further assessment revealed a major tear in RC. She had surgical repair on 10/07/23. Hand dominance: Right  PERTINENT HISTORY: Per referral:PT 1 x/week x 4 weeks for gentle PROM only, S/P L SA, Deb, SAD, RCR, DOS 10/07/23 Per referring physician note: Larey Seat in 2023, injured L shoulder. The pain never improved, has gotten worse. PMHx: Hepaptic Fibrosis, liver transplant 69yrs ago Chronic kidney disease  Sarcoidosis Double spinal fusion C4-5 Osteoporosis  Fall on 12/05/21  PAIN:  Are you  having pain? Yes: NPRS scale: I'm stiff this morning from the weather turning/no number given  Pain location: L shoulder Pain description: sore/stiff  Aggravating factors: Unknown. Has been totally immobilized Relieving factors: muscle relaxers and Tylenol , ice machine  PRECAUTIONS: Shoulder, Fall, and Other: Liver transplant, CKD  RED FLAGS: None   WEIGHT BEARING RESTRICTIONS: Yes NWB L  FALLS:  Has patient fallen in last 6 months? No  LIVING ENVIRONMENT: Lives with: lives with their family Lives in: House/apartment Stairs: Yes: Internal: 14 steps; on right going up Has following equipment at home: Walker - 2 wheeled  OCCUPATION: N/A since her liver transplant, She loves to garden and does chair yoga, walking, hiking, camping.  PLOF: Independent  PATIENT GOALS:Patient would like to return to her normal daily activities, including gardening.  NEXT MD VISIT: 11/19/23  OBJECTIVE:  Note: Objective measures were completed at Evaluation unless otherwise noted.  DIAGNOSTIC FINDINGS:  Not obtained.  COGNITION: Overall cognitive status: Within functional limits for tasks assessed     SENSATION: Not tested  POSTURE: Upright posture, L scapula sits in slight elevation and add.  UPPER EXTREMITY ROM: L elbow, wrist and hand all WNL  Passive ROM Right eval Left eval Left 11/11/23  Shoulder flexion  58 90*  Shoulder extension  20   Shoulder abduction  46 60* (approximate)  Shoulder adduction     Shoulder internal rotation  At least 60   Shoulder external rotation  neutral At 0* ABD, about 10* ER   Elbow flexion     Elbow extension     Wrist flexion     Wrist extension     Wrist ulnar deviation     Wrist radial deviation     Wrist pronation     Wrist supination     (Blank rows = not tested)  UPPER EXTREMITY MMT: L shoulder deferred, L elbow, wrist and hand at least 3/5.   SHOULDER SPECIAL TESTS: deferred  JOINT MOBILITY TESTING:  deferred  PALPATION:   Patient has increased tension in B UT, TTP and guarded all around L shoulder.  TREATMENT DATE:   11/11/23  PROM/stretching as appropriate all directions per protocol  Isometrics x10 each direction L shoulder  Seated scap retractions x20     11/05/23 Shrugs 2x10 Scapular retractions 2x10 Elbow Flex and Ext w/ pronation & supination  PROM w/ end range holds for shoulder, flex, ext, abd, IR/ER, all very gentle within tolerance RUE Isometric for 5x3'' flex, Ext, ER, and IR  10/28/23 Scapular retraction  Shrugs  Supine Shoulder Protraction  PROM for shoulder, flex, ext, abd, IR/ER, all very gentle within tolerance.  10/21/23 Scapular retractions, shoulder circles x 10 each direction PROM for shoulder, flex, ext, abd, IR/ER, all very gentle within tolerance. Pendulum exercises forward/back, side to side, circles in B directions x 10 each, required frequent VC for relaxation. L elbow, wrist and hand AROM. Patient declined ice as she has a machine at home.  10/14/23 Education, ROM Deep breathing techniques to facilitate relaxation of L upper quadrant.  PATIENT EDUCATION: Education details: POC Person educated: Patient Education method: Explanation Education comprehension: verbalized understanding  HOME EXERCISE PROGRAM:  [id number]  ASSESSMENT:  CLINICAL IMPRESSION:    Continued working on ROM and postural retraining as appropriate per protocol, she sees her MD next week and is hopeful to be released from her sling formally, also hopeful to be given updated restrictions/freedom to her arm actively. Lots of encouragement given today to allow for proper healing time and to respect limitations/pace of protocol. Tolerated session well today, will plan on getting progress note prior to that appt.    OBJECTIVE IMPAIRMENTS: decreased activity tolerance,  decreased coordination, decreased mobility, decreased ROM, decreased strength, increased muscle spasms, impaired flexibility, impaired UE functional use, postural dysfunction, and pain.   ACTIVITY LIMITATIONS: carrying, lifting, bed mobility, bathing, toileting, dressing, reach over head, and hygiene/grooming  PARTICIPATION LIMITATIONS: meal prep, cleaning, laundry, driving, shopping, and community activity  PERSONAL FACTORS: Past/current experiences are also affecting patient's functional outcome.   REHAB POTENTIAL: Good  CLINICAL DECISION MAKING: Evolving/moderate complexity  EVALUATION COMPLEXITY: Low   GOALS: Goals reviewed with patient? Yes  SHORT TERM GOALS: Target date: 11/05/23  I with initial HEP Baseline: Goal status: 10/21/23-met  LONG TERM GOALS: Target date: 01/06/24  I with final HEP Baseline:  Goal status: INITIAL  2.  Patient will recover full active L shoulder ROM in all planes of movement Baseline:  Goal status: INITIAL  3.  Increase L shoulder strength to 5/5 in all planes. Baseline:  Goal status: INITIAL  4.  Patient will be able to use LUE as normal when working in her garden including pulling weeds, hoeing, etc. Baseline:  Goal status: INITIAL  5.  Patient will report pain < 3/10 with all of her daily activities. Baseline:  Goal status: INITIAL  PLAN:  PT FREQUENCY: 1-2x/week  PT DURATION: 12 weeks  PLANNED INTERVENTIONS: 97110-Therapeutic exercises, 97530- Therapeutic activity, [id number]- Neuromuscular re-education, 97535- Self Care, [id number]- Manual therapy, 97016- Vasopneumatic device, 97033- Ionotophoresis 4mg /ml Dexamethasone, Patient/Family education, Taping, Joint mobilization, Cryotherapy, and Moist heat  PLAN FOR NEXT SESSION: Follow protocol and Dr instructions. Progress note next visit prior to MD appt   Nedra Hai, PT, DPT 11/11/23 9:27 AM

## 2023-11-13 ENCOUNTER — Other Ambulatory Visit: Payer: Self-pay | Admitting: Pulmonary Disease

## 2023-11-13 DIAGNOSIS — J4550 Severe persistent asthma, uncomplicated: Secondary | ICD-10-CM

## 2023-11-18 ENCOUNTER — Ambulatory Visit: Payer: Medicare Other | Admitting: Physical Therapy

## 2023-11-18 ENCOUNTER — Encounter: Payer: Self-pay | Admitting: Physical Therapy

## 2023-11-18 DIAGNOSIS — M6281 Muscle weakness (generalized): Secondary | ICD-10-CM | POA: Diagnosis not present

## 2023-11-18 DIAGNOSIS — Z9889 Other specified postprocedural states: Secondary | ICD-10-CM | POA: Diagnosis not present

## 2023-11-18 DIAGNOSIS — M25612 Stiffness of left shoulder, not elsewhere classified: Secondary | ICD-10-CM

## 2023-11-18 DIAGNOSIS — M25812 Other specified joint disorders, left shoulder: Secondary | ICD-10-CM | POA: Diagnosis not present

## 2023-11-18 NOTE — Therapy (Signed)
OUTPATIENT PHYSICAL THERAPY SHOULDER TREATMENT/PROGRESS NOTE     Patient Name: Alicia [name] MRN: [id number] DOB:Nov 20, 1960, 63 y.o., female Today's Date: 11/18/2023   Progress Note Reporting Period 10/14/23 to 11/18/23  See note below for Objective Data and Assessment of Progress/Goals.      END OF SESSION:  PT End of Session - 11/18/23 0847     Visit Number 6    Date for PT Re-Evaluation 01/06/24    Authorization Type BCBS MCR    Progress Note Due on Visit 16    PT Start Time 0846    PT Stop Time 0925    PT Time Calculation (min) 39 min    Activity Tolerance Patient limited by pain;Patient tolerated treatment well    Behavior During Therapy [facility] for tasks assessed/performed                Past Medical History:  Diagnosis Date   Allergy    Anemia    Asthma    Cellulitis    Cirrhosis (HCC)    GERD (gastroesophageal reflux disease)    Hepatic fibrosis    congenital   History of UTI    chronic as a child   Sarcoidosis    Spleen enlarged    Past Surgical History:  Procedure Laterality Date   COLONOSCOPY     CYST EXCISION Left 05/01/2017   Procedure: LEFT INDEX FINGER CYST EXCISION;  Surgeon: Tarry Kos, MD;  Location: [facility] OR;  Service: Orthopedics;  Laterality: Left;   ESOPHAGOGASTRODUODENOSCOPY     LIVER TRANSPLANT     SPINAL FUSION  12/2007   Double spinal Fusion   VIDEO BRONCHOSCOPY Bilateral 12/08/2015   Procedure: VIDEO BRONCHOSCOPY WITH FLUORO;  Surgeon: Oretha Milch, MD;  Location: [facility] ENDOSCOPY;  Service: Cardiopulmonary;  Laterality: Bilateral;   Patient Active Problem List   Diagnosis Date Noted   Osteoporosis without current pathological fracture 05/25/2018   Status post liver transplant (HCC) 12/25/2017   Syncope 07/17/2017   Other cirrhosis of liver (HCC) 05/10/2017   Immunocompromised patient (HCC) 05/10/2017   Mucous cyst of digit of left hand 04/11/2017   Cellulitis, abdominal wall 03/01/2017   Normocytic anemia 03/01/2017    Hypokalemia 03/01/2017   CKD (chronic kidney disease), stage III (HCC) 03/01/2017   Abdominal wall cellulitis 03/01/2017   Adrenal insufficiency (HCC) 03/01/2017   Hyperammonemia (HCC)    Cellulitis 06/28/2016   Thrombocytopenia (HCC) 06/28/2016   AKI (acute kidney injury) (HCC) 06/28/2016   Multiple pulmonary nodules determined by computed tomography of lung    Chronic cough 09/07/2015   Hypertension, portal (HCC) 09/04/2011   Asthma with chronic obstructive pulmonary disease (COPD) (HCC) 09/04/2011   Vocal cord nodules 07/04/2011   GERD (gastroesophageal reflux disease) 07/04/2011   Sarcoidosis 07/01/2011   Asthma, severe persistent, with upper airway instability 06/13/2011   Congenital hepatic fibrosis 07/03/2000    PCP: Copland, Gwenlyn Found, MD  REFERRING PROVIDER: Francena Hanly, MD   REFERRING DIAG: M75.42 Lt Shoulder Impingement   THERAPY DIAG:  S/P left rotator cuff repair  Impingement of left shoulder  Stiffness of left shoulder, not elsewhere classified  Muscle weakness (generalized)  Rationale for Evaluation and Treatment: Rehabilitation  ONSET DATE: 10/07/23  SUBJECTIVE:  SUBJECTIVE STATEMENT:  Felt really good after last PT session, it felt really good after a hot shower this morning too.  Seeing the doctor tomorrow and hopeful I can come out of the sling       EVAL: Patient reports that she fell about 2 years ago, injuring her shoulder. She tried [place] it, but was unsuccessful. Further assessment revealed a major tear in RC. She had surgical repair on 10/07/23. Hand dominance: Right  PERTINENT HISTORY: Per referral:PT 1 x/week x 4 weeks for gentle PROM only, S/P L SA, Deb, SAD, RCR, DOS 10/07/23 Per referring physician note: Larey Seat in 2023, injured L shoulder. The pain never  improved, has gotten worse. PMHx: Hepaptic Fibrosis, liver transplant 66yrs ago Chronic kidney disease  Sarcoidosis Double spinal fusion C4-5 Osteoporosis  Fall on 12/05/21  PAIN:  Are you having pain? No 0/10 now, has some nerve pain that comes and goes not consistent "only taking tylenol once very couple of days"   PRECAUTIONS: Shoulder, Fall, and Other: Liver transplant, CKD  RED FLAGS: None   WEIGHT BEARING RESTRICTIONS: Yes NWB L  FALLS:  Has patient fallen in last 6 months? No  LIVING ENVIRONMENT: Lives with: lives with their family Lives in: House/apartment Stairs: Yes: Internal: 14 steps; on right going up Has following equipment at home: Walker - 2 wheeled  OCCUPATION: N/A since her liver transplant, She loves to garden and does chair yoga, walking, hiking, camping.  PLOF: Independent  PATIENT GOALS:Patient would like to return to her normal daily activities, including gardening.  NEXT MD VISIT: 11/19/23  OBJECTIVE:  Note: Objective measures were completed at Evaluation unless otherwise noted.  DIAGNOSTIC FINDINGS:  Not obtained.  COGNITION: Overall cognitive status: Within functional limits for tasks assessed     SENSATION: Not tested  POSTURE: Upright posture, L scapula sits in slight elevation and add.  UPPER EXTREMITY ROM: L elbow, wrist and hand all WNL  Passive ROM Right eval Left eval Left 11/11/23 Left 11/18/23 PROM  Shoulder flexion  58 90* 90-100*   Shoulder extension  20    Shoulder abduction  46 60* (approximate) 80* (approximate), pain limited   Shoulder adduction      Shoulder internal rotation  At least 60    Shoulder external rotation  neutral At 0* ABD, about 10* ER  At 0* ABD, about 15* ER   Elbow flexion      Elbow extension      Wrist flexion      Wrist extension      Wrist ulnar deviation      Wrist radial deviation      Wrist pronation      Wrist supination      (Blank rows = not tested)  UPPER EXTREMITY MMT: L  shoulder deferred, L elbow, wrist and hand at least 3/5.   SHOULDER SPECIAL TESTS: deferred  JOINT MOBILITY TESTING:  deferred  PALPATION:  Patient has increased tension in B UT, TTP and guarded all around L shoulder.  TREATMENT DATE:    11/18/23  PROM/stretching as appropriate all directions per protocol Scap retractions x20  Upper trap stretch 2x30 seconds B  Levator stretch 2x30 seconds B     Education on progress towards goals, direction of LTGs and overall goals of POC, current restrictions post-op and PT hopeful that restriction will be progressed at MD visit tomorrow. Will plan on updating POC/visits per week after she sees MD pending his post-op reccs. Education to avoid shoulder shrug exercise as this reinforces bad movement patterns         11/11/23  PROM/stretching as appropriate all directions per protocol  Isometrics x10 each direction L shoulder  Seated scap retractions x20     11/05/23 Shrugs 2x10 Scapular retractions 2x10 Elbow Flex and Ext w/ pronation & supination  PROM w/ end range holds for shoulder, flex, ext, abd, IR/ER, all very gentle within tolerance RUE Isometric for 5x3'' flex, Ext, ER, and IR  10/28/23 Scapular retraction  Shrugs  Supine Shoulder Protraction  PROM for shoulder, flex, ext, abd, IR/ER, all very gentle within tolerance.  10/21/23 Scapular retractions, shoulder circles x 10 each direction PROM for shoulder, flex, ext, abd, IR/ER, all very gentle within tolerance. Pendulum exercises forward/back, side to side, circles in B directions x 10 each, required frequent VC for relaxation. L elbow, wrist and hand AROM. Patient declined ice as she has a machine at home.  10/14/23 Education, ROM Deep breathing techniques to facilitate relaxation of L upper quadrant.  PATIENT EDUCATION: Education details:  POC Person educated: Patient Education method: Explanation Education comprehension: verbalized understanding  HOME EXERCISE PROGRAM:  [email or web address]  ASSESSMENT:  CLINICAL IMPRESSION:   Arrives today doing OK, shoulder has been feeling good. Continued with PROM  and periscapular strengthening/retraining. Really progressing well, she is very eager to get better and improve her shoulder function, educated on standard course of healing and rehab given her procedure. Hopeful MD will give clearance to progress all activities in therapy at this upcoming appt.    OBJECTIVE IMPAIRMENTS: decreased activity tolerance, decreased coordination, decreased mobility, decreased ROM, decreased strength, increased muscle spasms, impaired flexibility, impaired UE functional use, postural dysfunction, and pain.   ACTIVITY LIMITATIONS: carrying, lifting, bed mobility, bathing, toileting, dressing, reach over head, and hygiene/grooming  PARTICIPATION LIMITATIONS: meal prep, cleaning, laundry, driving, shopping, and community activity  PERSONAL FACTORS: Past/current experiences are also affecting patient's functional outcome.   REHAB POTENTIAL: Good  CLINICAL DECISION MAKING: Evolving/moderate complexity  EVALUATION COMPLEXITY: Low   GOALS: Goals reviewed with patient? Yes  SHORT TERM GOALS: Target date: 11/05/23  I with initial HEP Baseline: Goal status: 10/21/23-met  LONG TERM GOALS: Target date: 01/06/24  I with final HEP Baseline:  Goal status: ONGOING 11/18/23  2.  Patient will recover full active L shoulder ROM in all planes of movement Baseline:  Goal status: ONGOING 11/18/23  3.  Increase L shoulder strength to 5/5 in all planes. Baseline:  Goal status: ONGOING 11/18/23  4.  Patient will be able to use LUE as normal when working in her garden including pulling weeds, hoeing, etc. Baseline:  Goal status: ONGOING 11/18/23  5.  Patient will report pain < 3/10 with all of her daily  activities. Baseline:  Goal status: MET 11/18/23 but activities still very limited at this point post-op   PLAN:  PT FREQUENCY: 1-2x/week  PT DURATION: 12 weeks  PLANNED INTERVENTIONS: 97110-Therapeutic exercises, 97530- Therapeutic activity, 97112- Neuromuscular re-education, 97535- Self Care, 75643- Manual therapy, 97016- Vasopneumatic device, [id number]- Ionotophoresis  4mg /ml Dexamethasone, Patient/Family education, Taping, Joint mobilization, Cryotherapy, and Moist heat  PLAN FOR NEXT SESSION: Follow protocol and Dr instructions, any updates to precautions?   Nedra Hai, PT, DPT 11/18/23 9:27 AM

## 2023-11-25 ENCOUNTER — Encounter: Payer: Self-pay | Admitting: Physical Therapy

## 2023-11-25 ENCOUNTER — Ambulatory Visit: Payer: Medicare Other | Admitting: Physical Therapy

## 2023-11-25 DIAGNOSIS — M25812 Other specified joint disorders, left shoulder: Secondary | ICD-10-CM | POA: Diagnosis not present

## 2023-11-25 DIAGNOSIS — M6281 Muscle weakness (generalized): Secondary | ICD-10-CM

## 2023-11-25 DIAGNOSIS — M25612 Stiffness of left shoulder, not elsewhere classified: Secondary | ICD-10-CM

## 2023-11-25 DIAGNOSIS — Z9889 Other specified postprocedural states: Secondary | ICD-10-CM

## 2023-11-25 NOTE — Therapy (Signed)
 OUTPATIENT PHYSICAL THERAPY SHOULDER TREATMENT   Patient Name: Alicia [name] MRN: [id number] DOB:04-Oct-1960, 63 y.o., female Today's Date: 11/25/2023       END OF SESSION:  PT End of Session - 11/25/23 0851     Visit Number 7    Date for PT Re-Evaluation 01/06/24    Authorization Type BCBS MCR    Progress Note Due on Visit 16    PT Start Time 0850    PT Stop Time 0928    PT Time Calculation (min) 38 min    Activity Tolerance Patient tolerated treatment well    Behavior During Therapy WFL for tasks assessed/performed                 Past Medical History:  Diagnosis Date   Allergy    Anemia    Asthma    Cellulitis    Cirrhosis (HCC)    GERD (gastroesophageal reflux disease)    Hepatic fibrosis    congenital   History of UTI    chronic as a child   Sarcoidosis    Spleen enlarged    Past Surgical History:  Procedure Laterality Date   COLONOSCOPY     CYST EXCISION Left 05/01/2017   Procedure: LEFT INDEX FINGER CYST EXCISION;  Surgeon: Tarry Kos, MD;  Location: [facility] OR;  Service: Orthopedics;  Laterality: Left;   ESOPHAGOGASTRODUODENOSCOPY     LIVER TRANSPLANT     SPINAL FUSION  12/2007   Double spinal Fusion   VIDEO BRONCHOSCOPY Bilateral 12/08/2015   Procedure: VIDEO BRONCHOSCOPY WITH FLUORO;  Surgeon: Oretha Milch, MD;  Location: [facility] ENDOSCOPY;  Service: Cardiopulmonary;  Laterality: Bilateral;   Patient Active Problem List   Diagnosis Date Noted   Osteoporosis without current pathological fracture 05/25/2018   Status post liver transplant (HCC) 12/25/2017   Syncope 07/17/2017   Other cirrhosis of liver (HCC) 05/10/2017   Immunocompromised patient (HCC) 05/10/2017   Mucous cyst of digit of left hand 04/11/2017   Cellulitis, abdominal wall 03/01/2017   Normocytic anemia 03/01/2017   Hypokalemia 03/01/2017   CKD (chronic kidney disease), stage III (HCC) 03/01/2017   Abdominal wall cellulitis 03/01/2017   Adrenal insufficiency (HCC) 03/01/2017    Hyperammonemia (HCC)    Cellulitis 06/28/2016   Thrombocytopenia (HCC) 06/28/2016   AKI (acute kidney injury) (HCC) 06/28/2016   Multiple pulmonary nodules determined by computed tomography of lung    Chronic cough 09/07/2015   Hypertension, portal (HCC) 09/04/2011   Asthma with chronic obstructive pulmonary disease (COPD) (HCC) 09/04/2011   Vocal cord nodules 07/04/2011   GERD (gastroesophageal reflux disease) 07/04/2011   Sarcoidosis 07/01/2011   Asthma, severe persistent, with upper airway instability 06/13/2011   Congenital hepatic fibrosis 07/03/2000    PCP: Copland, Gwenlyn Found, MD  REFERRING PROVIDER: Francena Hanly, MD   REFERRING DIAG: M75.42 Lt Shoulder Impingement   THERAPY DIAG:  S/P left rotator cuff repair  Stiffness of left shoulder, not elsewhere classified  Muscle weakness (generalized)  Rationale for Evaluation and Treatment: Rehabilitation  ONSET DATE: 10/07/23  SUBJECTIVE:  SUBJECTIVE STATEMENT:   Saw the doctor, he told me OK to work on active work and gave me wall walks with my shoulder. He said now its time to switch to strengthening the muscles.      EVAL: Patient reports that she fell about 2 years ago, injuring her shoulder. She tried [place] it, but was unsuccessful. Further assessment revealed a major tear in RC. She had surgical repair on 10/07/23. Hand dominance: Right  PERTINENT HISTORY: Per referral:PT 1 x/week x 4 weeks for gentle PROM only, S/P L SA, Deb, SAD, RCR, DOS 10/07/23 Per referring physician note: Larey Seat in 2023, injured L shoulder. The pain never improved, has gotten worse. PMHx: Hepaptic Fibrosis, liver transplant 60yrs ago Chronic kidney disease  Sarcoidosis Double spinal fusion C4-5 Osteoporosis  Fall on 12/05/21  PAIN:  Are you having pain? No  0/10 at rest, "but if I exercise a lot I get that nerve pain going down my arm so maybe I am over doing"   PRECAUTIONS: Shoulder, Fall, and Other: Liver transplant, CKD  RED FLAGS: None   WEIGHT BEARING RESTRICTIONS: Yes NWB L  FALLS:  Has patient fallen in last 6 months? No  LIVING ENVIRONMENT: Lives with: lives with their family Lives in: House/apartment Stairs: Yes: Internal: 14 steps; on right going up Has following equipment at home: Walker - 2 wheeled  OCCUPATION: N/A since her liver transplant, She loves to garden and does chair yoga, walking, hiking, camping.  PLOF: Independent  PATIENT GOALS:Patient would like to return to her normal daily activities, including gardening.  NEXT MD VISIT: 11/19/23  OBJECTIVE:  Note: Objective measures were completed at Evaluation unless otherwise noted.  DIAGNOSTIC FINDINGS:  Not obtained.  COGNITION: Overall cognitive status: Within functional limits for tasks assessed     SENSATION: Not tested  POSTURE: Upright posture, L scapula sits in slight elevation and add.  UPPER EXTREMITY ROM: L elbow, wrist and hand all WNL  Passive ROM Right eval Left eval Left 11/11/23 Left 11/18/23 PROM  Shoulder flexion  58 90* 90-100*   Shoulder extension  20    Shoulder abduction  46 60* (approximate) 80* (approximate), pain limited   Shoulder adduction      Shoulder internal rotation  At least 60    Shoulder external rotation  neutral At 0* ABD, about 10* ER  At 0* ABD, about 15* ER   Elbow flexion      Elbow extension      Wrist flexion      Wrist extension      Wrist ulnar deviation      Wrist radial deviation      Wrist pronation      Wrist supination      (Blank rows = not tested)  UPPER EXTREMITY MMT: L shoulder deferred, L elbow, wrist and hand at least 3/5.   SHOULDER SPECIAL TESTS: deferred  JOINT MOBILITY TESTING:  deferred  PALPATION:  Patient has increased tension in B UT, TTP and guarded all around L  shoulder.  TREATMENT DATE:   11/25/23  PROM/stretching as appropriate all directions per protocol AAROM shoulder flexion 10x5 second holds supine with dowel  AAROM shoulder ABD 10x5 second holds supine with dowel  AAROM shoulder ER 10x5 seconds hold seated with dowel     Education on following pace of protocol instead of under or over-doing, respect healing time needed and make sure we are building good movement patterns instead of allowing for compensations     11/18/23  PROM/stretching as appropriate all directions per protocol Scap retractions x20  Upper trap stretch 2x30 seconds B  Levator stretch 2x30 seconds B     Education on progress towards goals, direction of LTGs and overall goals of POC, current restrictions post-op and PT hopeful that restriction will be progressed at MD visit tomorrow. Will plan on updating POC/visits per week after she sees MD pending his post-op reccs. Education to avoid shoulder shrug exercise as this reinforces bad movement patterns         11/11/23  PROM/stretching as appropriate all directions per protocol  Isometrics x10 each direction L shoulder  Seated scap retractions x20       PATIENT EDUCATION: Education details: POC Person educated: Patient Education method: Explanation Education comprehension: verbalized understanding  HOME EXERCISE PROGRAM:   Access Code: [email or web address] URL: [email or web address] Date: 11/25/2023 Prepared by: Nedra Hai  Exercises - Seated Scapular Retraction  - 1 x daily - 7 x weekly - 3 sets - 10 reps - Seated Cervical Retraction  - 1 x daily - 7 x weekly - 3 sets - 10 reps - Flexion-Extension Shoulder Pendulum with Table Support  - 1 x daily - 7 x weekly - 3 sets - 10 reps - Horizontal Shoulder Pendulum with Table Support  - 1 x daily - 7 x weekly - 3 sets -  10 reps - Circular Shoulder Pendulum with Table Support  - 1 x daily - 7 x weekly - 3 sets - 10 reps - Seated Shoulder Pendulum Exercise  - 1 x daily - 7 x weekly - 3 sets - 10 reps - Supine Shoulder Flexion Extension AAROM with Dowel  - 2 x daily - 7 x weekly - 1 sets - 10 reps - 5 seconds  hold - Supine Shoulder Abduction AAROM with Dowel  - 2 x daily - 7 x weekly - 1 sets - 10 reps - 5 seconds  hold - Seated Shoulder External Rotation AAROM with Dowel  - 2 x daily - 7 x weekly - 1 sets - 10 reps - 5 seconds  hold    ASSESSMENT:  CLINICAL IMPRESSION:   Pt arrives today doing great, saw the MD and he did progress her restrictions- sent direct message to surgeon this AM asking for specific updates. Added in some AAROM activities to HEP, limited by mm fatigue, surgical UE very tremulous today. Lots of education given to go at pace of protocol and allow for appropriate healing/not overdo. Will continue to progress as appropriate.   OBJECTIVE IMPAIRMENTS: decreased activity tolerance, decreased coordination, decreased mobility, decreased ROM, decreased strength, increased muscle spasms, impaired flexibility, impaired UE functional use, postural dysfunction, and pain.   ACTIVITY LIMITATIONS: carrying, lifting, bed mobility, bathing, toileting, dressing, reach over head, and hygiene/grooming  PARTICIPATION LIMITATIONS: meal prep, cleaning, laundry, driving, shopping, and community activity  PERSONAL FACTORS: Past/current experiences are also affecting patient's functional outcome.   REHAB POTENTIAL: Good  CLINICAL DECISION MAKING: Evolving/moderate complexity  EVALUATION COMPLEXITY: Low   GOALS: Goals reviewed with  patient? Yes  SHORT TERM GOALS: Target date: 11/05/23  I with initial HEP Baseline: Goal status: 10/21/23-met  LONG TERM GOALS: Target date: 01/06/24  I with final HEP Baseline:  Goal status: ONGOING 11/18/23  2.  Patient will recover full active L shoulder ROM in all  planes of movement Baseline:  Goal status: ONGOING 11/18/23  3.  Increase L shoulder strength to 5/5 in all planes. Baseline:  Goal status: ONGOING 11/18/23  4.  Patient will be able to use LUE as normal when working in her garden including pulling weeds, hoeing, etc. Baseline:  Goal status: ONGOING 11/18/23  5.  Patient will report pain < 3/10 with all of her daily activities. Baseline:  Goal status: MET 11/18/23 but activities still very limited at this point post-op   PLAN:  PT FREQUENCY: 1-2x/week  PT DURATION: 12 weeks  PLANNED INTERVENTIONS: 97110-Therapeutic exercises, 97530- Therapeutic activity, 97112- Neuromuscular re-education, 97535- Self Care, 14782- Manual therapy, 97016- Vasopneumatic device, 97033- Ionotophoresis 4mg /ml Dexamethasone, Patient/Family education, Taping, Joint mobilization, Cryotherapy, and Moist heat  PLAN FOR NEXT SESSION: Follow protocol and Dr instructions, work on [employer], any updates from Careers adviser about strengthening?   Nedra Hai, PT, DPT 11/25/23 9:30 AM

## 2023-11-27 ENCOUNTER — Encounter: Payer: Self-pay | Admitting: Physical Therapy

## 2023-11-27 NOTE — Therapy (Signed)
 Per epic secure chat with surgeon:  Per our protocol, patients may begin active range of motion 6 weeks postop from rotator cuff repair.  Progressive strengthening may advance gradually as tolerated.   Nedra Hai, PT, DPT 11/27/23 1:57 PM

## 2023-11-28 ENCOUNTER — Ambulatory Visit: Payer: Medicare Other | Admitting: Physical Therapy

## 2023-11-28 ENCOUNTER — Encounter: Payer: Self-pay | Admitting: Physical Therapy

## 2023-11-28 DIAGNOSIS — M25612 Stiffness of left shoulder, not elsewhere classified: Secondary | ICD-10-CM

## 2023-11-28 DIAGNOSIS — M6281 Muscle weakness (generalized): Secondary | ICD-10-CM

## 2023-11-28 DIAGNOSIS — M25812 Other specified joint disorders, left shoulder: Secondary | ICD-10-CM | POA: Diagnosis not present

## 2023-11-28 DIAGNOSIS — Z9889 Other specified postprocedural states: Secondary | ICD-10-CM

## 2023-11-28 NOTE — Therapy (Signed)
 OUTPATIENT PHYSICAL THERAPY SHOULDER TREATMENT   Patient Name: Oriyah Lamphear MRN: [id number] DOB:01/22/1961, 63 y.o., female Today's Date: 11/28/2023       END OF SESSION:  PT End of Session - 11/28/23 0846     Visit Number 8    Date for PT Re-Evaluation 01/06/24    PT Start Time 0845    PT Stop Time 0930    PT Time Calculation (min) 45 min    Activity Tolerance Patient tolerated treatment well    Behavior During Therapy [facility] for tasks assessed/performed                 Past Medical History:  Diagnosis Date   Allergy    Anemia    Asthma    Cellulitis    Cirrhosis (HCC)    GERD (gastroesophageal reflux disease)    Hepatic fibrosis    congenital   History of UTI    chronic as a child   Sarcoidosis    Spleen enlarged    Past Surgical History:  Procedure Laterality Date   COLONOSCOPY     CYST EXCISION Left 05/01/2017   Procedure: LEFT INDEX FINGER CYST EXCISION;  Surgeon: Tarry Kos, MD;  Location: [facility] OR;  Service: Orthopedics;  Laterality: Left;   ESOPHAGOGASTRODUODENOSCOPY     LIVER TRANSPLANT     SPINAL FUSION  12/2007   Double spinal Fusion   VIDEO BRONCHOSCOPY Bilateral 12/08/2015   Procedure: VIDEO BRONCHOSCOPY WITH FLUORO;  Surgeon: Oretha Milch, MD;  Location: [facility] ENDOSCOPY;  Service: Cardiopulmonary;  Laterality: Bilateral;   Patient Active Problem List   Diagnosis Date Noted   Osteoporosis without current pathological fracture 05/25/2018   Status post liver transplant (HCC) 12/25/2017   Syncope 07/17/2017   Other cirrhosis of liver (HCC) 05/10/2017   Immunocompromised patient (HCC) 05/10/2017   Mucous cyst of digit of left hand 04/11/2017   Cellulitis, abdominal wall 03/01/2017   Normocytic anemia 03/01/2017   Hypokalemia 03/01/2017   CKD (chronic kidney disease), stage III (HCC) 03/01/2017   Abdominal wall cellulitis 03/01/2017   Adrenal insufficiency (HCC) 03/01/2017   Hyperammonemia (HCC)    Cellulitis 06/28/2016   Thrombocytopenia  (HCC) 06/28/2016   AKI (acute kidney injury) (HCC) 06/28/2016   Multiple pulmonary nodules determined by computed tomography of lung    Chronic cough 09/07/2015   Hypertension, portal (HCC) 09/04/2011   Asthma with chronic obstructive pulmonary disease (COPD) (HCC) 09/04/2011   Vocal cord nodules 07/04/2011   GERD (gastroesophageal reflux disease) 07/04/2011   Sarcoidosis 07/01/2011   Asthma, severe persistent, with upper airway instability 06/13/2011   Congenital hepatic fibrosis 07/03/2000    PCP: Copland, Gwenlyn Found, MD  REFERRING PROVIDER: Francena Hanly, MD   REFERRING DIAG: M75.42 Lt Shoulder Impingement   THERAPY DIAG:  S/P left rotator cuff repair  Stiffness of left shoulder, not elsewhere classified  Muscle weakness (generalized)  Rationale for Evaluation and Treatment: Rehabilitation  ONSET DATE: 10/07/23  SUBJECTIVE:  SUBJECTIVE STATEMENT:  Been working them more so is sore.    EVAL: Patient reports that she fell about 2 years ago, injuring her shoulder. She tried [place] it, but was unsuccessful. Further assessment revealed a major tear in RC. She had surgical repair on 10/07/23. Hand dominance: Right  PERTINENT HISTORY: Per referral:PT 1 x/week x 4 weeks for gentle PROM only, S/P L SA, Deb, SAD, RCR, DOS 10/07/23 Per referring physician note: Larey Seat in 2023, injured L shoulder. The pain never improved, has gotten worse. PMHx: Hepaptic Fibrosis, liver transplant 40yrs ago Chronic kidney disease  Sarcoidosis Double spinal fusion C4-5 Osteoporosis  Fall on 12/05/21  PAIN:  Are you having pain? No 0/10 at rest, "just sore from my workouts"   PRECAUTIONS: Shoulder, Fall, and Other: Liver transplant, CKD  RED FLAGS: None   WEIGHT BEARING RESTRICTIONS: Yes NWB L  FALLS:  Has patient  fallen in last 6 months? No  LIVING ENVIRONMENT: Lives with: lives with their family Lives in: House/apartment Stairs: Yes: Internal: 14 steps; on right going up Has following equipment at home: Walker - 2 wheeled  OCCUPATION: N/A since her liver transplant, She loves to garden and does chair yoga, walking, hiking, camping.  PLOF: Independent  PATIENT GOALS:Patient would like to return to her normal daily activities, including gardening.  NEXT MD VISIT: 11/19/23  OBJECTIVE:  Note: Objective measures were completed at Evaluation unless otherwise noted.  DIAGNOSTIC FINDINGS:  Not obtained.  COGNITION: Overall cognitive status: Within functional limits for tasks assessed     SENSATION: Not tested  POSTURE: Upright posture, L scapula sits in slight elevation and add.  UPPER EXTREMITY ROM: L elbow, wrist and hand all WNL  Passive ROM Right eval Left eval Left 11/11/23 Left 11/18/23 PROM  Shoulder flexion  58 90* 90-100*   Shoulder extension  20    Shoulder abduction  46 60* (approximate) 80* (approximate), pain limited   Shoulder adduction      Shoulder internal rotation  At least 60    Shoulder external rotation  neutral At 0* ABD, about 10* ER  At 0* ABD, about 15* ER   Elbow flexion      Elbow extension      Wrist flexion      Wrist extension      Wrist ulnar deviation      Wrist radial deviation      Wrist pronation      Wrist supination      (Blank rows = not tested)  UPPER EXTREMITY MMT: L shoulder deferred, L elbow, wrist and hand at least 3/5.   SHOULDER SPECIAL TESTS: deferred  JOINT MOBILITY TESTING:  deferred  PALPATION:  Patient has increased tension in B UT, TTP and guarded all around L shoulder.                                                                                                                             TREATMENT DATE:  11/28/23 AAROM Flex, Ext, IR dowel 2x10 UBE L1 x 2 min each Rows & Ext yellow 2x10 Shoulder ER/IR AROM  3x10 Seated shoulder abd 2x5 PROM/stretching as appropriate all directions per protocol  11/25/23  PROM/stretching as appropriate all directions per protocol AAROM shoulder flexion 10x5 second holds supine with dowel  AAROM shoulder ABD 10x5 second holds supine with dowel  AAROM shoulder ER 10x5 seconds hold seated with dowel     Education on following pace of protocol instead of under or over-doing, respect healing time needed and make sure we are building good movement patterns instead of allowing for compensations     11/18/23  PROM/stretching as appropriate all directions per protocol Scap retractions x20  Upper trap stretch 2x30 seconds B  Levator stretch 2x30 seconds B     Education on progress towards goals, direction of LTGs and overall goals of POC, current restrictions post-op and PT hopeful that restriction will be progressed at MD visit tomorrow. Will plan on updating POC/visits per week after she sees MD pending his post-op reccs. Education to avoid shoulder shrug exercise as this reinforces bad movement patterns    11/11/23  PROM/stretching as appropriate all directions per protocol  Isometrics x10 each direction L shoulder  Seated scap retractions x20       PATIENT EDUCATION: Education details: POC Person educated: Patient Education method: Explanation Education comprehension: verbalized understanding  HOME EXERCISE PROGRAM:   Access Code: [email or web address] URL: [email or web address] Date: 11/28/2023 Prepared by: Debroah Baller  Exercises - Seated Scapular Retraction  - 1 x daily - 7 x weekly - 3 sets - 10 reps - Seated Cervical Retraction  - 1 x daily - 7 x weekly - 3 sets - 10 reps - Supine Shoulder Flexion Extension AAROM with Dowel  - 2 x daily - 7 x weekly - 1 sets - 10 reps - 5 seconds  hold - Supine Shoulder Abduction AAROM with Dowel  - 2 x daily - 7 x weekly - 1 sets - 10 reps - 5 seconds  hold - Seated Shoulder External Rotation  AAROM with Dowel  - 2 x daily - 7 x weekly - 1 sets - 10 reps - 5 seconds  hold    ASSESSMENT:  CLINICAL IMPRESSION:   Pt enters doing well, she reports some soreness form progressing with her shoulder. Continued with AAROM activities as well as scapular strengthening using light resistance. Surgical UE very tremulous during PROM, cues given to pt to relax.  Will continue to progress as appropriate.   OBJECTIVE IMPAIRMENTS: decreased activity tolerance, decreased coordination, decreased mobility, decreased ROM, decreased strength, increased muscle spasms, impaired flexibility, impaired UE functional use, postural dysfunction, and pain.   ACTIVITY LIMITATIONS: carrying, lifting, bed mobility, bathing, toileting, dressing, reach over head, and hygiene/grooming  PARTICIPATION LIMITATIONS: meal prep, cleaning, laundry, driving, shopping, and community activity  PERSONAL FACTORS: Past/current experiences are also affecting patient's functional outcome.   REHAB POTENTIAL: Good  CLINICAL DECISION MAKING: Evolving/moderate complexity  EVALUATION COMPLEXITY: Low   GOALS: Goals reviewed with patient? Yes  SHORT TERM GOALS: Target date: 11/05/23  I with initial HEP Baseline: Goal status: 10/21/23-met  LONG TERM GOALS: Target date: 01/06/24  I with final HEP Baseline:  Goal status: ONGOING 11/18/23  2.  Patient will recover full active L shoulder ROM in all planes of movement Baseline:  Goal status: ONGOING 11/18/23  3.  Increase L shoulder strength to 5/5 in all planes. Baseline:  Goal status: ONGOING 11/18/23  4.  Patient will be able to use LUE as normal when working in her garden including pulling weeds, hoeing, etc. Baseline:  Goal status: ONGOING 11/18/23  5.  Patient will report pain < 3/10 with all of her daily activities. Baseline:  Goal status: MET 11/18/23 but activities still very limited at this point post-op   PLAN:  PT FREQUENCY: 1-2x/week  PT DURATION: 12  weeks  PLANNED INTERVENTIONS: 97110-Therapeutic exercises, 97530- Therapeutic activity, 97112- Neuromuscular re-education, 97535- Self Care, 45409- Manual therapy, 97016- Vasopneumatic device, 97033- Ionotophoresis 4mg /ml Dexamethasone, Patient/Family education, Taping, Joint mobilization, Cryotherapy, and Moist heat  PLAN FOR NEXT SESSION: Follow protocol and Dr instructions, work on [employer], any updates from Careers adviser about strengthening?   Nedra Hai, PT, DPT 11/28/23 8:46 AM

## 2023-12-01 ENCOUNTER — Other Ambulatory Visit: Payer: Self-pay | Admitting: Pulmonary Disease

## 2023-12-01 DIAGNOSIS — R053 Chronic cough: Secondary | ICD-10-CM

## 2023-12-02 ENCOUNTER — Ambulatory Visit: Payer: Medicare Other | Admitting: Physical Therapy

## 2023-12-04 ENCOUNTER — Ambulatory Visit: Payer: Medicare Other | Attending: Orthopedic Surgery | Admitting: Physical Therapy

## 2023-12-04 DIAGNOSIS — M6281 Muscle weakness (generalized): Secondary | ICD-10-CM | POA: Insufficient documentation

## 2023-12-04 DIAGNOSIS — M542 Cervicalgia: Secondary | ICD-10-CM | POA: Insufficient documentation

## 2023-12-04 DIAGNOSIS — Z9889 Other specified postprocedural states: Secondary | ICD-10-CM | POA: Diagnosis not present

## 2023-12-04 DIAGNOSIS — M25612 Stiffness of left shoulder, not elsewhere classified: Secondary | ICD-10-CM | POA: Diagnosis not present

## 2023-12-04 DIAGNOSIS — M25512 Pain in left shoulder: Secondary | ICD-10-CM | POA: Insufficient documentation

## 2023-12-04 DIAGNOSIS — R293 Abnormal posture: Secondary | ICD-10-CM | POA: Insufficient documentation

## 2023-12-04 DIAGNOSIS — M25812 Other specified joint disorders, left shoulder: Secondary | ICD-10-CM | POA: Insufficient documentation

## 2023-12-04 NOTE — Therapy (Signed)
 OUTPATIENT PHYSICAL THERAPY SHOULDER TREATMENT   Patient Name: Alicia [name] MRN: [id number] DOB:May 08, 1961, 63 y.o., female Today's Date: 12/04/2023       END OF SESSION:  PT End of Session - 12/04/23 0843     Visit Number 9    Date for PT Re-Evaluation 01/06/24    Authorization Type [employer] MCR    PT Start Time 0845    PT Stop Time 0930    PT Time Calculation (min) 45 min                 Past Medical History:  Diagnosis Date   Allergy    Anemia    Asthma    Cellulitis    Cirrhosis (HCC)    GERD (gastroesophageal reflux disease)    Hepatic fibrosis    congenital   History of UTI    chronic as a child   Sarcoidosis    Spleen enlarged    Past Surgical History:  Procedure Laterality Date   COLONOSCOPY     CYST EXCISION Left 05/01/2017   Procedure: LEFT INDEX FINGER CYST EXCISION;  Surgeon: Tarry Kos, MD;  Location: [facility] OR;  Service: Orthopedics;  Laterality: Left;   ESOPHAGOGASTRODUODENOSCOPY     LIVER TRANSPLANT     SPINAL FUSION  12/2007   Double spinal Fusion   VIDEO BRONCHOSCOPY Bilateral 12/08/2015   Procedure: VIDEO BRONCHOSCOPY WITH FLUORO;  Surgeon: Oretha Milch, MD;  Location: [facility] ENDOSCOPY;  Service: Cardiopulmonary;  Laterality: Bilateral;   Patient Active Problem List   Diagnosis Date Noted   Osteoporosis without current pathological fracture 05/25/2018   Status post liver transplant (HCC) 12/25/2017   Syncope 07/17/2017   Other cirrhosis of liver (HCC) 05/10/2017   Immunocompromised patient (HCC) 05/10/2017   Mucous cyst of digit of left hand 04/11/2017   Cellulitis, abdominal wall 03/01/2017   Normocytic anemia 03/01/2017   Hypokalemia 03/01/2017   CKD (chronic kidney disease), stage III (HCC) 03/01/2017   Abdominal wall cellulitis 03/01/2017   Adrenal insufficiency (HCC) 03/01/2017   Hyperammonemia (HCC)    Cellulitis 06/28/2016   Thrombocytopenia (HCC) 06/28/2016   AKI (acute kidney injury) (HCC) 06/28/2016   Multiple pulmonary  nodules determined by computed tomography of lung    Chronic cough 09/07/2015   Hypertension, portal (HCC) 09/04/2011   Asthma with chronic obstructive pulmonary disease (COPD) (HCC) 09/04/2011   Vocal cord nodules 07/04/2011   GERD (gastroesophageal reflux disease) 07/04/2011   Sarcoidosis 07/01/2011   Asthma, severe persistent, with upper airway instability 06/13/2011   Congenital hepatic fibrosis 07/03/2000    PCP: Copland, Gwenlyn Found, MD  REFERRING PROVIDER: Francena Hanly, MD   REFERRING DIAG: M75.42 Lt Shoulder Impingement   THERAPY DIAG:  S/P left rotator cuff repair  Stiffness of left shoulder, not elsewhere classified  Muscle weakness (generalized)  Rationale for Evaluation and Treatment: Rehabilitation  ONSET DATE: 10/07/23  SUBJECTIVE:  SUBJECTIVE STATEMENT:  Some shooting nerve pain/zings and soreness from ex. MD 3/26  EVAL: Patient reports that she fell about 2 years ago, injuring her shoulder. She tried [place] it, but was unsuccessful. Further assessment revealed a major tear in RC. She had surgical repair on 10/07/23. Hand dominance: Right  PERTINENT HISTORY: Per referral:PT 1 x/week x 4 weeks for gentle PROM only, S/P L SA, Deb, SAD, RCR, DOS 10/07/23 Per referring physician note: Larey Seat in 2023, injured L shoulder. The pain never improved, has gotten worse. PMHx: Hepaptic Fibrosis, liver transplant 67yrs ago Chronic kidney disease  Sarcoidosis Double spinal fusion C4-5 Osteoporosis  Fall on 12/05/21  PAIN:  Are you having pain? No 0/10 at rest, "just sore from my workouts"   PRECAUTIONS: Shoulder, Fall, and Other: Liver transplant, CKD  RED FLAGS: None   WEIGHT BEARING RESTRICTIONS: Yes NWB L  FALLS:  Has patient fallen in last 6 months? No  LIVING ENVIRONMENT: Lives  with: lives with their family Lives in: House/apartment Stairs: Yes: Internal: 14 steps; on right going up Has following equipment at home: Walker - 2 wheeled  OCCUPATION: N/A since her liver transplant, She loves to garden and does chair yoga, walking, hiking, camping.  PLOF: Independent  PATIENT GOALS:Patient would like to return to her normal daily activities, including gardening.  NEXT MD VISIT: 11/19/23  OBJECTIVE:  Note: Objective measures were completed at Evaluation unless otherwise noted.  DIAGNOSTIC FINDINGS:  Not obtained.  COGNITION: Overall cognitive status: Within functional limits for tasks assessed     SENSATION: Not tested  POSTURE: Upright posture, L scapula sits in slight elevation and add.  UPPER EXTREMITY ROM: L elbow, wrist and hand all WNL  Passive ROM Right eval Left eval Left 11/11/23 Left 11/18/23 PROM  Shoulder flexion  58 90* 90-100*   Shoulder extension  20    Shoulder abduction  46 60* (approximate) 80* (approximate), pain limited   Shoulder adduction      Shoulder internal rotation  At least 60    Shoulder external rotation  neutral At 0* ABD, about 10* ER  At 0* ABD, about 15* ER   Elbow flexion      Elbow extension      Wrist flexion      Wrist extension      Wrist ulnar deviation      Wrist radial deviation      Wrist pronation      Wrist supination      (Blank rows = not tested)  UPPER EXTREMITY MMT: L shoulder deferred, L elbow, wrist and hand at least 3/5.   SHOULDER SPECIAL TESTS: deferred  JOINT MOBILITY TESTING:  deferred  PALPATION:  Patient has increased tension in B UT, TTP and guarded all around L shoulder.                                                                                                                             TREATMENT  DATE:   12/02/23 UBE L 2 fwd 2 min backward Finger ladder with ecc lowering 5 x fwd/5 x laterally Abd with cane working to increase ROM-aded for home ( seh was doing  but not pushing ROM, plus added flexion LUE AROM standing flex  134     abd 105 Rows & Ext yellow tband 2x10 Shoulder ER/IR AROM 2 sets 10 yellow tband Bicep curl yellow tband 2 sets 10 PROM left shld 2 sets 10 each way                                  11/28/23 AAROM Flex, Ext, IR dowel 2x10 UBE L1 x 2 min each Rows & Ext yellow 2x10 Shoulder ER/IR AROM 3x10 Seated shoulder abd 2x5 PROM/stretching as appropriate all directions per protocol  11/25/23  PROM/stretching as appropriate all directions per protocol AAROM shoulder flexion 10x5 second holds supine with dowel  AAROM shoulder ABD 10x5 second holds supine with dowel  AAROM shoulder ER 10x5 seconds hold seated with dowel     Education on following pace of protocol instead of under or over-doing, respect healing time needed and make sure we are building good movement patterns instead of allowing for compensations     11/18/23  PROM/stretching as appropriate all directions per protocol Scap retractions x20  Upper trap stretch 2x30 seconds B  Levator stretch 2x30 seconds B     Education on progress towards goals, direction of LTGs and overall goals of POC, current restrictions post-op and PT hopeful that restriction will be progressed at MD visit tomorrow. Will plan on updating POC/visits per week after she sees MD pending his post-op reccs. Education to avoid shoulder shrug exercise as this reinforces bad movement patterns    11/11/23  PROM/stretching as appropriate all directions per protocol  Isometrics x10 each direction L shoulder  Seated scap retractions x20       PATIENT EDUCATION: Education details: POC Person educated: Patient Education method: Explanation Education comprehension: verbalized understanding  HOME EXERCISE PROGRAM:  Access Code: [email or web address] URL: [email or web address] Date: 12/04/2023 Prepared by: Jadavion Spoelstra  Exercises - Shoulder extension with resistance -  Neutral  - 1 x daily - 7 x weekly - 2 sets - 10 reps - Standing Shoulder Row with Anchored Resistance  - 1 x daily - 7 x weekly - 2 sets - 10 reps - Shoulder External Rotation with Anchored Resistance  - 1 x daily - 7 x weekly - 2 sets - 10 reps - Shoulder Internal Rotation with Resistance  - 1 x daily - 7 x weekly - 2 sets - 10 reps - Standing Bicep Curls with Resistance  - 1 x daily - 7 x weekly - 2 sets - 10 reps - Standing Shoulder Abduction AAROM with Dowel  - 2 x daily - 7 x weekly - 1 sets - 10-15 reps   Access Code: [email or web address] URL: [email or web address] Date: 11/28/2023 Prepared by: Debroah Baller  Exercises - Seated Scapular Retraction  - 1 x daily - 7 x weekly - 3 sets - 10 reps - Seated Cervical Retraction  - 1 x daily - 7 x weekly - 3 sets - 10 reps - Supine Shoulder Flexion Extension AAROM with Dowel  - 2 x daily - 7 x weekly - 1 sets - 10 reps - 5 seconds  hold - Supine Shoulder Abduction AAROM with Dowel  - 2 x daily - 7 x  weekly - 1 sets - 10 reps - 5 seconds  hold - Seated Shoulder External Rotation AAROM with Dowel  - 2 x daily - 7 x weekly - 1 sets - 10 reps - 5 seconds  hold    ASSESSMENT:  CLINICAL IMPRESSION: addressed ROM and goals and documented/ progressed HEP     OBJECTIVE IMPAIRMENTS: decreased activity tolerance, decreased coordination, decreased mobility, decreased ROM, decreased strength, increased muscle spasms, impaired flexibility, impaired UE functional use, postural dysfunction, and pain.   ACTIVITY LIMITATIONS: carrying, lifting, bed mobility, bathing, toileting, dressing, reach over head, and hygiene/grooming  PARTICIPATION LIMITATIONS: meal prep, cleaning, laundry, driving, shopping, and community activity  PERSONAL FACTORS: Past/current experiences are also affecting patient's functional outcome.   REHAB POTENTIAL: Good  CLINICAL DECISION MAKING: Evolving/moderate complexity  EVALUATION COMPLEXITY: Low   GOALS: Goals  reviewed with patient? Yes  SHORT TERM GOALS: Target date: 11/05/23  I with initial HEP Baseline: Goal status: 10/21/23-met  LONG TERM GOALS: Target date: 01/06/24  I with final HEP Baseline:  Goal status: ONGOING 11/18/23  evolving 12/05/23  2.  Patient will recover full active L shoulder ROM in all planes of movement Baseline:  Goal status: ONGOING 11/18/23  3.  Increase L shoulder strength to 5/5 in all planes. Baseline:  Goal status: ONGOING 11/18/23  4.  Patient will be able to use LUE as normal when working in her garden including pulling weeds, hoeing, etc. Baseline:  Goal status: ONGOING 11/18/23  ROM progressing 12/05/23  5.  Patient will report pain < 3/10 with all of her daily activities. Baseline:  Goal status: MET 11/18/23 but activities still very limited at this point post-op   PLAN:  PT FREQUENCY: 1-2x/week  PT DURATION: 12 weeks  PLANNED INTERVENTIONS: 97110-Therapeutic exercises, 97530- Therapeutic activity, 97112- Neuromuscular re-education, 97535- Self Care, 16109- Manual therapy, 97016- Vasopneumatic device, [phone]- Ionotophoresis 4mg /ml Dexamethasone, Patient/Family education, Taping, Joint mobilization, Cryotherapy, and Moist heat  PLAN FOR NEXT SESSION: Follow protocol and Dr instructions, work on [employer], any updates from Careers adviser about strengthening MD 3/26   Patient Details  Name: Shirah Roseman MRN: [id number] Date of Birth: [birth year] Referring Provider:  Pearline Cables, MD  Encounter Date: 12/04/2023   Suanne Marker, PTA 12/04/2023, 8:43 AM  [facility] [address] [place], [place], [address] Phone: [phone]   Fax:  [phone]

## 2023-12-09 ENCOUNTER — Ambulatory Visit: Payer: Medicare Other | Admitting: Physical Therapy

## 2023-12-09 DIAGNOSIS — M25512 Pain in left shoulder: Secondary | ICD-10-CM | POA: Diagnosis not present

## 2023-12-09 DIAGNOSIS — M6281 Muscle weakness (generalized): Secondary | ICD-10-CM | POA: Diagnosis not present

## 2023-12-09 DIAGNOSIS — M25812 Other specified joint disorders, left shoulder: Secondary | ICD-10-CM | POA: Diagnosis not present

## 2023-12-09 DIAGNOSIS — Z9889 Other specified postprocedural states: Secondary | ICD-10-CM

## 2023-12-09 DIAGNOSIS — M25612 Stiffness of left shoulder, not elsewhere classified: Secondary | ICD-10-CM | POA: Diagnosis not present

## 2023-12-09 DIAGNOSIS — R293 Abnormal posture: Secondary | ICD-10-CM | POA: Diagnosis not present

## 2023-12-09 DIAGNOSIS — M542 Cervicalgia: Secondary | ICD-10-CM | POA: Diagnosis not present

## 2023-12-09 NOTE — Therapy (Signed)
 OUTPATIENT PHYSICAL THERAPY SHOULDER TREATMENT   Patient Name: Alicia [name] MRN: [id number] DOB:May 26, 1961, 63 y.o., female Today's Date: 12/09/2023       END OF SESSION:  PT End of Session - 12/09/23 0932     Visit Number 10    Date for PT Re-Evaluation 01/06/24    Authorization Type [employer] MCR    PT Start Time 0930    PT Stop Time 1015    PT Time Calculation (min) 45 min                 Past Medical History:  Diagnosis Date   Allergy    Anemia    Asthma    Cellulitis    Cirrhosis (HCC)    GERD (gastroesophageal reflux disease)    Hepatic fibrosis    congenital   History of UTI    chronic as a child   Sarcoidosis    Spleen enlarged    Past Surgical History:  Procedure Laterality Date   COLONOSCOPY     CYST EXCISION Left 05/01/2017   Procedure: LEFT INDEX FINGER CYST EXCISION;  Surgeon: Tarry Kos, MD;  Location: [facility] OR;  Service: Orthopedics;  Laterality: Left;   ESOPHAGOGASTRODUODENOSCOPY     LIVER TRANSPLANT     SPINAL FUSION  12/2007   Double spinal Fusion   VIDEO BRONCHOSCOPY Bilateral 12/08/2015   Procedure: VIDEO BRONCHOSCOPY WITH FLUORO;  Surgeon: Oretha Milch, MD;  Location: [facility] ENDOSCOPY;  Service: Cardiopulmonary;  Laterality: Bilateral;   Patient Active Problem List   Diagnosis Date Noted   Osteoporosis without current pathological fracture 05/25/2018   Status post liver transplant (HCC) 12/25/2017   Syncope 07/17/2017   Other cirrhosis of liver (HCC) 05/10/2017   Immunocompromised patient (HCC) 05/10/2017   Mucous cyst of digit of left hand 04/11/2017   Cellulitis, abdominal wall 03/01/2017   Normocytic anemia 03/01/2017   Hypokalemia 03/01/2017   CKD (chronic kidney disease), stage III (HCC) 03/01/2017   Abdominal wall cellulitis 03/01/2017   Adrenal insufficiency (HCC) 03/01/2017   Hyperammonemia (HCC)    Cellulitis 06/28/2016   Thrombocytopenia (HCC) 06/28/2016   AKI (acute kidney injury) (HCC) 06/28/2016   Multiple pulmonary  nodules determined by computed tomography of lung    Chronic cough 09/07/2015   Hypertension, portal (HCC) 09/04/2011   Asthma with chronic obstructive pulmonary disease (COPD) (HCC) 09/04/2011   Vocal cord nodules 07/04/2011   GERD (gastroesophageal reflux disease) 07/04/2011   Sarcoidosis 07/01/2011   Asthma, severe persistent, with upper airway instability 06/13/2011   Congenital hepatic fibrosis 07/03/2000    PCP: Copland, Gwenlyn Found, MD  REFERRING PROVIDER: Francena Hanly, MD   REFERRING DIAG: M75.42 Lt Shoulder Impingement   THERAPY DIAG:  S/P left rotator cuff repair  Stiffness of left shoulder, not elsewhere classified  Muscle weakness (generalized)  Rationale for Evaluation and Treatment: Rehabilitation  ONSET DATE: 10/07/23  SUBJECTIVE:  SUBJECTIVE STATEMENT:  New ex are good but challenging. I know I need to get range and strength back PERTINENT HISTORY: Per referral:PT 1 x/week x 4 weeks for gentle PROM only, S/P L SA, Deb, SAD, RCR, DOS 10/07/23 Per referring physician note: Larey Seat in 2023, injured L shoulder. The pain never improved, has gotten worse. PMHx: Hepaptic Fibrosis, liver transplant 62yrs ago Chronic kidney disease  Sarcoidosis Double spinal fusion C4-5 Osteoporosis  Fall on 12/05/21  PAIN:  Are you having pain? No 0/10 at rest, "just sore from my workouts"   PRECAUTIONS: Shoulder, Fall, and Other: Liver transplant, CKD  RED FLAGS: None   WEIGHT BEARING RESTRICTIONS: Yes NWB L  FALLS:  Has patient fallen in last 6 months? No  LIVING ENVIRONMENT: Lives with: lives with their family Lives in: House/apartment Stairs: Yes: Internal: 14 steps; on right going up Has following equipment at home: Walker - 2 wheeled  OCCUPATION: N/A since her liver transplant, She loves to  garden and does chair yoga, walking, hiking, camping.  PLOF: Independent  PATIENT GOALS:Patient would like to return to her normal daily activities, including gardening.  NEXT MD VISIT: 11/19/23  OBJECTIVE:  Note: Objective measures were completed at Evaluation unless otherwise noted.  DIAGNOSTIC FINDINGS:  Not obtained.  COGNITION: Overall cognitive status: Within functional limits for tasks assessed     SENSATION: Not tested  POSTURE: Upright posture, L scapula sits in slight elevation and add.  UPPER EXTREMITY ROM: L elbow, wrist and hand all WNL  Passive ROM Right eval Left eval Left 11/11/23 Left 11/18/23 PROM  Shoulder flexion  58 90* 90-100*   Shoulder extension  20    Shoulder abduction  46 60* (approximate) 80* (approximate), pain limited   Shoulder adduction      Shoulder internal rotation  At least 60    Shoulder external rotation  neutral At 0* ABD, about 10* ER  At 0* ABD, about 15* ER   Elbow flexion      Elbow extension      Wrist flexion      Wrist extension      Wrist ulnar deviation      Wrist radial deviation      Wrist pronation      Wrist supination      (Blank rows = not tested)  UPPER EXTREMITY MMT: L shoulder deferred, L elbow, wrist and hand at least 3/5.   SHOULDER SPECIAL TESTS: deferred  JOINT MOBILITY TESTING:  deferred  PALPATION:  Patient has increased tension in B UT, TTP and guarded all around L shoulder.                                                                                                                             TREATMENT DATE:   12/09/23 UBE L 2 3 min fwd and backward LUE AROM standing flex  134  abd 105 Finger ladder flex and abd 5 x each-eccentric lowering Cabinet reaching 1  shelf 1# 2 sets 5 flex and abd Seated row 15# 2 sets 10 Lat pull 15# 2 sets 10 ( PTA assist with pull down ,focus on flexion stretch) 1# standing func reaching 8x 3 sets 5# cable pulleys shld ext 2 sets 10 Bicep curl 5# 2 sets  10 Tricep ext  15# 2 sets 10 PROM left shld 2 sets 10 each way    12/02/23 UBE L 2 fwd 2 min backward Finger ladder with ecc lowering 5 x fwd/5 x laterally Abd with cane working to increase ROM-aded for home ( seh was doing but not pushing ROM, plus added flexion LUE AROM standing flex  134     abd 105 Rows & Ext yellow tband 2x10 Shoulder ER/IR AROM 2 sets 10 yellow tband Bicep curl yellow tband 2 sets 10 PROM left shld 2 sets 10 each way                                  11/28/23 AAROM Flex, Ext, IR dowel 2x10 UBE L1 x 2 min each Rows & Ext yellow 2x10 Shoulder ER/IR AROM 3x10 Seated shoulder abd 2x5 PROM/stretching as appropriate all directions per protocol  11/25/23  PROM/stretching as appropriate all directions per protocol AAROM shoulder flexion 10x5 second holds supine with dowel  AAROM shoulder ABD 10x5 second holds supine with dowel  AAROM shoulder ER 10x5 seconds hold seated with dowel     Education on following pace of protocol instead of under or over-doing, respect healing time needed and make sure we are building good movement patterns instead of allowing for compensations     11/18/23  PROM/stretching as appropriate all directions per protocol Scap retractions x20  Upper trap stretch 2x30 seconds B  Levator stretch 2x30 seconds B     Education on progress towards goals, direction of LTGs and overall goals of POC, current restrictions post-op and PT hopeful that restriction will be progressed at MD visit tomorrow. Will plan on updating POC/visits per week after she sees MD pending his post-op reccs. Education to avoid shoulder shrug exercise as this reinforces bad movement patterns    11/11/23  PROM/stretching as appropriate all directions per protocol  Isometrics x10 each direction L shoulder  Seated scap retractions x20       PATIENT EDUCATION: Education details: POC Person educated: Patient Education method: Explanation Education  comprehension: verbalized understanding  HOME EXERCISE PROGRAM:  Access Code: [email or web address] URL: [email or web address] Date: 12/04/2023 Prepared by: Charleen Madera  Exercises - Shoulder extension with resistance - Neutral  - 1 x daily - 7 x weekly - 2 sets - 10 reps - Standing Shoulder Row with Anchored Resistance  - 1 x daily - 7 x weekly - 2 sets - 10 reps - Shoulder External Rotation with Anchored Resistance  - 1 x daily - 7 x weekly - 2 sets - 10 reps - Shoulder Internal Rotation with Resistance  - 1 x daily - 7 x weekly - 2 sets - 10 reps - Standing Bicep Curls with Resistance  - 1 x daily - 7 x weekly - 2 sets - 10 reps - Standing Shoulder Abduction AAROM with Dowel  - 2 x daily - 7 x weekly - 1 sets - 10-15 reps   Access Code: [email or web address] URL: [email or web address] Date: 11/28/2023 Prepared by: Debroah Baller  Exercises - Seated Scapular Retraction  - 1 x daily - 7 x weekly - 3  sets - 10 reps - Seated Cervical Retraction  - 1 x daily - 7 x weekly - 3 sets - 10 reps - Supine Shoulder Flexion Extension AAROM with Dowel  - 2 x daily - 7 x weekly - 1 sets - 10 reps - 5 seconds  hold - Supine Shoulder Abduction AAROM with Dowel  - 2 x daily - 7 x weekly - 1 sets - 10 reps - 5 seconds  hold - Seated Shoulder External Rotation AAROM with Dowel  - 2 x daily - 7 x weekly - 1 sets - 10 reps - 5 seconds  hold    ASSESSMENT:  CLINICAL IMPRESSION: pt is s/p 9 weeks so we are slowly progressing with PREs and ROM. Increased HEP last week with tband and she states  very challenging but going well. Progressed ex today with cuing and verb/tactile feedback. Still guarded and limited with PROM but with cuing can relax.     OBJECTIVE IMPAIRMENTS: decreased activity tolerance, decreased coordination, decreased mobility, decreased ROM, decreased strength, increased muscle spasms, impaired flexibility, impaired UE functional use, postural dysfunction, and pain.   ACTIVITY  LIMITATIONS: carrying, lifting, bed mobility, bathing, toileting, dressing, reach over head, and hygiene/grooming  PARTICIPATION LIMITATIONS: meal prep, cleaning, laundry, driving, shopping, and community activity  PERSONAL FACTORS: Past/current experiences are also affecting patient's functional outcome.   REHAB POTENTIAL: Good  CLINICAL DECISION MAKING: Evolving/moderate complexity  EVALUATION COMPLEXITY: Low   GOALS: Goals reviewed with patient? Yes  SHORT TERM GOALS: Target date: 11/05/23  I with initial HEP Baseline: Goal status: 10/21/23-met  LONG TERM GOALS: Target date: 01/06/24  I with final HEP Baseline:  Goal status: ONGOING 11/18/23  evolving 12/05/23  2.  Patient will recover full active L shoulder ROM in all planes of movement Baseline:  Goal status: ONGOING 11/18/23   12/09/23 progressing  3.  Increase L shoulder strength to 5/5 in all planes. Baseline:  Goal status: ONGOING 11/18/23  12/09/23 progressing  4.  Patient will be able to use LUE as normal when working in her garden including pulling weeds, hoeing, etc. Baseline:  Goal status: ONGOING 11/18/23  ROM progressing 12/05/23  5.  Patient will report pain < 3/10 with all of her daily activities. Baseline:  Goal status: MET 11/18/23 but activities still very limited at this point post-op   PLAN:  PT FREQUENCY: 1-2x/week  PT DURATION: 12 weeks  PLANNED INTERVENTIONS: 97110-Therapeutic exercises, 97530- Therapeutic activity, 97112- Neuromuscular re-education, 97535- Self Care, 16109- Manual therapy, 97016- Vasopneumatic device, 97033- Ionotophoresis 4mg /ml Dexamethasone, Patient/Family education, Taping, Joint mobilization, Cryotherapy, and Moist heat  PLAN FOR NEXT SESSION: Follow protocol and Dr instructions, s/p 9 weeks slowly progressing PREs. MD 3/26   Patient Details  Name: Alicia [name] MRN: [id number] Date of Birth: 03/20/1961 Referring Provider:  Pearline Cables, MD  Encounter Date:  12/09/2023   Suanne Marker, PTA 12/09/2023, 9:33 AM  [facility] [address] [place], [place], [address] Phone: [phone]   Fax:  [phone] [address] [place], [place], [address] Phone: [phone]   Fax:  [phone]

## 2023-12-11 ENCOUNTER — Ambulatory Visit: Payer: Medicare Other | Admitting: Physical Therapy

## 2023-12-11 DIAGNOSIS — M542 Cervicalgia: Secondary | ICD-10-CM | POA: Diagnosis not present

## 2023-12-11 DIAGNOSIS — Z9889 Other specified postprocedural states: Secondary | ICD-10-CM | POA: Diagnosis not present

## 2023-12-11 DIAGNOSIS — M25812 Other specified joint disorders, left shoulder: Secondary | ICD-10-CM | POA: Diagnosis not present

## 2023-12-11 DIAGNOSIS — M25612 Stiffness of left shoulder, not elsewhere classified: Secondary | ICD-10-CM | POA: Diagnosis not present

## 2023-12-11 DIAGNOSIS — R293 Abnormal posture: Secondary | ICD-10-CM | POA: Diagnosis not present

## 2023-12-11 DIAGNOSIS — H2513 Age-related nuclear cataract, bilateral: Secondary | ICD-10-CM | POA: Diagnosis not present

## 2023-12-11 DIAGNOSIS — M6281 Muscle weakness (generalized): Secondary | ICD-10-CM

## 2023-12-11 DIAGNOSIS — Z135 Encounter for screening for eye and ear disorders: Secondary | ICD-10-CM | POA: Diagnosis not present

## 2023-12-11 DIAGNOSIS — M25512 Pain in left shoulder: Secondary | ICD-10-CM | POA: Diagnosis not present

## 2023-12-11 NOTE — Therapy (Signed)
 OUTPATIENT PHYSICAL THERAPY SHOULDER TREATMENT   Patient Name: Alicia [name] MRN: [id number] DOB:Apr 07, 1961, 63 y.o., female Today's Date: 12/11/2023       END OF SESSION:  PT End of Session - 12/11/23 0846     Visit Number 11    Date for PT Re-Evaluation 01/06/24    Authorization Type [employer] MCR    PT Start Time 0846    PT Stop Time 0930    PT Time Calculation (min) 44 min                 Past Medical History:  Diagnosis Date   Allergy    Anemia    Asthma    Cellulitis    Cirrhosis (HCC)    GERD (gastroesophageal reflux disease)    Hepatic fibrosis    congenital   History of UTI    chronic as a child   Sarcoidosis    Spleen enlarged    Past Surgical History:  Procedure Laterality Date   COLONOSCOPY     CYST EXCISION Left 05/01/2017   Procedure: LEFT INDEX FINGER CYST EXCISION;  Surgeon: Tarry Kos, MD;  Location: [facility] OR;  Service: Orthopedics;  Laterality: Left;   ESOPHAGOGASTRODUODENOSCOPY     LIVER TRANSPLANT     SPINAL FUSION  12/2007   Double spinal Fusion   VIDEO BRONCHOSCOPY Bilateral 12/08/2015   Procedure: VIDEO BRONCHOSCOPY WITH FLUORO;  Surgeon: Oretha Milch, MD;  Location[facility] END[facility]COPY;  Service: Cardiopulmonary;  Laterality: Bilateral;   Patient Active Problem List   Diagnosis Date Noted   Osteoporosis without current pathological fracture 05/25/2018   Status post liver transplant (HCC) 12/25/2017   Syncope 07/17/2017   Other cirrhosis of liver (HCC) 05/10/2017   Immunocompromised patient (HCC) 05/10/2017   Mucous cyst of digit of left hand 04/11/2017   Cellulitis, abdominal wall 03/01/2017   Normocytic anemia 03/01/2017   Hypokalemia 03/01/2017   CKD (chronic kidney disease), stage III (HCC) 03/01/2017   Abdominal wall cellulitis 03/01/2017   Adrenal insufficiency (HCC) 03/01/2017   Hyperammonemia (HCC)    Cellulitis 06/28/2016   Thrombocytopenia (HCC) 06/28/2016   AKI (acute kidney injury) (HCC) 06/28/2016   Multiple pulmonary  nodules determined by computed tomography of lung    Chronic cough 09/07/2015   Hypertension, portal (HCC) 09/04/2011   Asthma with chronic obstructive pulmonary disease (COPD) (HCC) 09/04/2011   Vocal cord nodules 07/04/2011   GERD (gastroesophageal reflux disease) 07/04/2011   Sarcoidosis 07/01/2011   Asthma, severe persistent, with upper airway instability 06/13/2011   Congenital hepatic fibrosis 07/03/2000    PCP: Copland, Gwenlyn Found, MD  REFERRING PROVIDER: Francena Hanly, MD   REFERRING DIAG: M75.42 Lt Shoulder Impingement   THERAPY DIAG:  S/P left rotator cuff repair  Stiffness of left shoulder, not elsewhere classified  Muscle weakness (generalized)  Rationale for Evaluation and Treatment: Rehabilitation  ONSET DATE: 10/07/23  SUBJECTIVE:  SUBJECTIVE STATEMENT:  Felt goods to do more last session, did okay after PERTINENT HISTORY: Per referral:PT 1 x/week x 4 weeks for gentle PROM only, S/P L SA, Deb, SAD, RCR, DOS 10/07/23 Per referring physician note: Larey Seat in 2023, injured L shoulder. The pain never improved, has gotten worse. PMHx: Hepaptic Fibrosis, liver transplant 69yrs ago Chronic kidney disease  Sarcoidosis Double spinal fusion C4-5 Osteoporosis  Fall on 12/05/21  PAIN:  Are you having pain? No 0/10 at rest, "just sore from my workouts"   PRECAUTIONS: Shoulder, Fall, and Other: Liver transplant, CKD  RED FLAGS: None   WEIGHT BEARING RESTRICTIONS: Yes NWB L  FALLS:  Has patient fallen in last 6 months? No  LIVING ENVIRONMENT: Lives with: lives with their family Lives in: House/apartment Stairs: Yes: Internal: 14 steps; on right going up Has following equipment at home: Walker - 2 wheeled  OCCUPATION: N/A since her liver transplant, She loves to garden and does chair  yoga, walking, hiking, camping.  PLOF: Independent  PATIENT GOALS:Patient would like to return to her normal daily activities, including gardening.  NEXT MD VISIT: 11/19/23  OBJECTIVE:  Note: Objective measures were completed at Evaluation unless otherwise noted.  DIAGNOSTIC FINDINGS:  Not obtained.  COGNITION: Overall cognitive status: Within functional limits for tasks assessed     SENSATION: Not tested  POSTURE: Upright posture, L scapula sits in slight elevation and add.  UPPER EXTREMITY ROM: L elbow, wrist and hand all WNL  Passive ROM Right eval Left eval Left 11/11/23 Left 11/18/23 PROM  Shoulder flexion  58 90* 90-100*   Shoulder extension  20    Shoulder abduction  46 60* (approximate) 80* (approximate), pain limited   Shoulder adduction      Shoulder internal rotation  At least 60    Shoulder external rotation  neutral At 0* ABD, about 10* ER  At 0* ABD, about 15* ER   Elbow flexion      Elbow extension      Wrist flexion      Wrist extension      Wrist ulnar deviation      Wrist radial deviation      Wrist pronation      Wrist supination      (Blank rows = not tested)  UPPER EXTREMITY MMT: L shoulder deferred, L elbow, wrist and hand at least 3/5.   SHOULDER SPECIAL TESTS: deferred  JOINT MOBILITY TESTING:  deferred  PALPATION:  Patient has increased tension in B UT, TTP and guarded all around L shoulder.                                                                                                                             TREATMENT DATE:   12/11/23 UBE L 2 fwd and back Seated row 15# 2 sets 10 Lat pull 15# 2 sets 10 ( PTA assist with pull down ,focus on flexion stretch) Chest press 5# 2 sets 8  Yellow tband rhy stab on wall 10 x 3 way Rolling ball up wall 10 x 1# supine IR,ER,chest press,flexion 2 sets 10 2# supine shld cirlces 2 sets 10 PROM left shld 2 sets 10 each way     12/09/23 UBE L 2 3 min fwd and backward LUE AROM  standing flex  134  abd 105 Finger ladder flex and abd 5 x each-eccentric lowering Cabinet reaching 1 shelf 1# 2 sets 5 flex and abd Seated row 15# 2 sets 10 Lat pull 15# 2 sets 10 ( PTA assist with pull down ,focus on flexion stretch) 1# standing func reaching 8x 3 sets 5# cable pulleys shld ext 2 sets 10 Bicep curl 5# 2 sets 10 Tricep ext  15# 2 sets 10 PROM left shld 2 sets 10 each way    12/02/23 UBE L 2 fwd 2 min backward Finger ladder with ecc lowering 5 x fwd/5 x laterally Abd with cane working to increase ROM-aded for home ( seh was doing but not pushing ROM, plus added flexion LUE AROM standing flex  134     abd 105 Rows & Ext yellow tband 2x10 Shoulder ER/IR AROM 2 sets 10 yellow tband Bicep curl yellow tband 2 sets 10 PROM left shld 2 sets 10 each way                                  11/28/23 AAROM Flex, Ext, IR dowel 2x10 UBE L1 x 2 min each Rows & Ext yellow 2x10 Shoulder ER/IR AROM 3x10 Seated shoulder abd 2x5 PROM/stretching as appropriate all directions per protocol  11/25/23  PROM/stretching as appropriate all directions per protocol AAROM shoulder flexion 10x5 second holds supine with dowel  AAROM shoulder ABD 10x5 second holds supine with dowel  AAROM shoulder ER 10x5 seconds hold seated with dowel     Education on following pace of protocol instead of under or over-doing, respect healing time needed and make sure we are building good movement patterns instead of allowing for compensations     11/18/23  PROM/stretching as appropriate all directions per protocol Scap retractions x20  Upper trap stretch 2x30 seconds B  Levator stretch 2x30 seconds B     Education on progress towards goals, direction of LTGs and overall goals of POC, current restrictions post-op and PT hopeful that restriction will be progressed at MD visit tomorrow. Will plan on updating POC/visits per week after she sees MD pending his post-op reccs. Education to avoid  shoulder shrug exercise as this reinforces bad movement patterns    11/11/23  PROM/stretching as appropriate all directions per protocol  Isometrics x10 each direction L shoulder  Seated scap retractions x20       PATIENT EDUCATION: Education details: POC Person educated: Patient Education method: Explanation Education comprehension: verbalized understanding  HOME EXERCISE PROGRAM:  Access Code: [email or web address] URL: [email or web address] Date: 12/04/2023 Prepared by: Shavone Nevers  Exercises - Shoulder extension with resistance - Neutral  - 1 x daily - 7 x weekly - 2 sets - 10 reps - Standing Shoulder Row with Anchored Resistance  - 1 x daily - 7 x weekly - 2 sets - 10 reps - Shoulder External Rotation with Anchored Resistance  - 1 x daily - 7 x weekly - 2 sets - 10 reps - Shoulder Internal Rotation with Resistance  - 1 x daily - 7 x weekly - 2 sets - 10 reps -  Standing Bicep Curls with Resistance  - 1 x daily - 7 x weekly - 2 sets - 10 reps - Standing Shoulder Abduction AAROM with Dowel  - 2 x daily - 7 x weekly - 1 sets - 10-15 reps   Access Code: [email or web address] URL: [email or web address] Date: 11/28/2023 Prepared by: Debroah Baller  Exercises - Seated Scapular Retraction  - 1 x daily - 7 x weekly - 3 sets - 10 reps - Seated Cervical Retraction  - 1 x daily - 7 x weekly - 3 sets - 10 reps - Supine Shoulder Flexion Extension AAROM with Dowel  - 2 x daily - 7 x weekly - 1 sets - 10 reps - 5 seconds  hold - Supine Shoulder Abduction AAROM with Dowel  - 2 x daily - 7 x weekly - 1 sets - 10 reps - 5 seconds  hold - Seated Shoulder External Rotation AAROM with Dowel  - 2 x daily - 7 x weekly - 1 sets - 10 reps - 5 seconds  hold    ASSESSMENT:  CLINICAL IMPRESSION: pt is s/p 9 weeks so we continue to  slowly progressing with PREs and ROM. Verb and tactile cuing given with ex and stretching to maximize ROM and decrease compensations    OBJECTIVE  IMPAIRMENTS: decreased activity tolerance, decreased coordination, decreased mobility, decreased ROM, decreased strength, increased muscle spasms, impaired flexibility, impaired UE functional use, postural dysfunction, and pain.   ACTIVITY LIMITATIONS: carrying, lifting, bed mobility, bathing, toileting, dressing, reach over head, and hygiene/grooming  PARTICIPATION LIMITATIONS: meal prep, cleaning, laundry, driving, shopping, and community activity  PERSONAL FACTORS: Past/current experiences are also affecting patient's functional outcome.   REHAB POTENTIAL: Good  CLINICAL DECISION MAKING: Evolving/moderate complexity  EVALUATION COMPLEXITY: Low   GOALS: Goals reviewed with patient? Yes  SHORT TERM GOALS: Target date: 11/05/23  I with initial HEP Baseline: Goal status: 10/21/23-met  LONG TERM GOALS: Target date: 01/06/24  I with final HEP Baseline:  Goal status: ONGOING 11/18/23  evolving 12/05/23  2.  Patient will recover full active L shoulder ROM in all planes of movement Baseline:  Goal status: ONGOING 11/18/23   12/09/23 progressing  3.  Increase L shoulder strength to 5/5 in all planes. Baseline:  Goal status: ONGOING 11/18/23  12/09/23 progressing  4.  Patient will be able to use LUE as normal when working in her garden including pulling weeds, hoeing, etc. Baseline:  Goal status: ONGOING 11/18/23  ROM progressing 12/05/23  5.  Patient will report pain < 3/10 with all of her daily activities. Baseline:  Goal status: MET 11/18/23 but activities still very limited at this point post-op   PLAN:  PT FREQUENCY: 1-2x/week  PT DURATION: 12 weeks  PLANNED INTERVENTIONS: 97110-Therapeutic exercises, 97530- Therapeutic activity, 97112- Neuromuscular re-education, 97535- Self Care, 30865- Manual therapy, 97016- Vasopneumatic device, 97033- Ionotophoresis 4mg /ml Dexamethasone, Patient/Family education, Taping, Joint mobilization, Cryotherapy, and Moist heat  PLAN FOR NEXT  SESSION: Follow protocol and Dr instructions, s/p 9 weeks slowly progressing PREs. MD 3/26   Patient Details  Name: Alicia [name] MRN: [id number] Date of Birth: [birth year] Referring Provider:  Pearline Cables, MD  Encounter Date: 12/11/2023   Suanne Marker, PTA 12/11/2023, 8:47 AM  [facility] [address] [place], [place], [address] Phone: [phone]   Fax:  [phone] [address] [place], [place], [address] Phone: [phone]   Fax:  [phone] [facility]  [facility] [address] [place], [place], [address] Phone: [phone]   Fax:  [phone]  Patient Details  Name: Alicia [name] MRN: [id number] Date of Birth: 05-26-1961 Referring Provider:  Pearline Cables, MD  Encounter Date: 12/11/2023   Suanne Marker, PTA 12/11/2023, 8:47 AM  [place] [address] [place], [place], [address] Phone: [phone]   Fax:  [phone]

## 2023-12-16 ENCOUNTER — Ambulatory Visit: Payer: Medicare Other | Admitting: Physical Therapy

## 2023-12-16 DIAGNOSIS — M25812 Other specified joint disorders, left shoulder: Secondary | ICD-10-CM

## 2023-12-16 DIAGNOSIS — R293 Abnormal posture: Secondary | ICD-10-CM | POA: Diagnosis not present

## 2023-12-16 DIAGNOSIS — M6281 Muscle weakness (generalized): Secondary | ICD-10-CM | POA: Diagnosis not present

## 2023-12-16 DIAGNOSIS — M542 Cervicalgia: Secondary | ICD-10-CM | POA: Diagnosis not present

## 2023-12-16 DIAGNOSIS — M25612 Stiffness of left shoulder, not elsewhere classified: Secondary | ICD-10-CM | POA: Diagnosis not present

## 2023-12-16 DIAGNOSIS — Z9889 Other specified postprocedural states: Secondary | ICD-10-CM | POA: Diagnosis not present

## 2023-12-16 DIAGNOSIS — M25512 Pain in left shoulder: Secondary | ICD-10-CM | POA: Diagnosis not present

## 2023-12-16 NOTE — Therapy (Signed)
 OUTPATIENT PHYSICAL THERAPY SHOULDER TREATMENT   Patient Name: Alicia [name] MRN: [id number] DOB:[birth year], 63 y.o., female Today's Date: 12/16/2023       END OF SESSION:  PT End of Session - 12/16/23 0843     Visit Number 12    Date for PT Re-Evaluation 01/06/24    Authorization Type [employer] MCR    PT Start Time 0845    PT Stop Time 0930    PT Time Calculation (min) 45 min                 Past Medical History:  Diagnosis Date   Allergy    Anemia    Asthma    Cellulitis    Cirrhosis (HCC)    GERD (gastroesophageal reflux disease)    Hepatic fibrosis    congenital   History of UTI    chronic as a child   Sarcoidosis    Spleen enlarged    Past Surgical History:  Procedure Laterality Date   COLONOSCOPY     CYST EXCISION Left 05/01/2017   Procedure: LEFT INDEX FINGER CYST EXCISION;  Surgeon: Tarry Kos, MD;  Location: [facility] OR;  Service: Orthopedics;  Laterality: Left;   ESOPHAGOGASTRODUODENOSCOPY     LIVER TRANSPLANT     SPINAL FUSION  12/2007   Double spinal Fusion   VIDEO BRONCHOSCOPY Bilateral 12/08/2015   Procedure: VIDEO BRONCHOSCOPY WITH FLUORO;  Surgeon: Oretha Milch, MD;  Location: [facility]COPY;  Service: Cardiopulmonary;  Laterality: Bilateral;   Patient Active Problem List   Diagnosis Date Noted   Osteoporosis without current pathological fracture 05/25/2018   Status post liver transplant (HCC) 12/25/2017   Syncope 07/17/2017   Other cirrhosis of liver (HCC) 05/10/2017   Immunocompromised patient (HCC) 05/10/2017   Mucous cyst of digit of left hand 04/11/2017   Cellulitis, abdominal wall 03/01/2017   Normocytic anemia 03/01/2017   Hypokalemia 03/01/2017   CKD (chronic kidney disease), stage III (HCC) 03/01/2017   Abdominal wall cellulitis 03/01/2017   Adrenal insufficiency (HCC) 03/01/2017   Hyperammonemia (HCC)    Cellulitis 06/28/2016   Thrombocytopenia (HCC) 06/28/2016   AKI (acute kidney injury) (HCC) 06/28/2016   Multiple pulmonary  nodules determined by computed tomography of lung    Chronic cough 09/07/2015   Hypertension, portal (HCC) 09/04/2011   Asthma with chronic obstructive pulmonary disease (COPD) (HCC) 09/04/2011   Vocal cord nodules 07/04/2011   GERD (gastroesophageal reflux disease) 07/04/2011   Sarcoidosis 07/01/2011   Asthma, severe persistent, with upper airway instability 06/13/2011   Congenital hepatic fibrosis 07/03/2000    PCP: Copland, Gwenlyn Found, MD  REFERRING PROVIDER: Francena Hanly, MD   REFERRING DIAG: M75.42 Lt Shoulder Impingement   THERAPY DIAG:  S/P left rotator cuff repair  Stiffness of left shoulder, not elsewhere classified  Muscle weakness (generalized)  Impingement of left shoulder  Rationale for Evaluation and Treatment: Rehabilitation  ONSET DATE: 10/07/23  SUBJECTIVE:  SUBJECTIVE STATEMENT:  Hurt for days after last session, overdid PERTINENT HISTORY: Per referral:PT 1 x/week x 4 weeks for gentle PROM only, S/P L SA, Deb, SAD, RCR, DOS 10/07/23 Per referring physician note: Larey Seat in 2023, injured L shoulder. The pain never improved, has gotten worse. PMHx: Hepaptic Fibrosis, liver transplant 18yrs ago Chronic kidney disease  Sarcoidosis Double spinal fusion C4-5 Osteoporosis  Fall on 12/05/21  PAIN:  Are you having pain? No 0/10 at rest, "just sore from my workouts"   PRECAUTIONS: Shoulder, Fall, and Other: Liver transplant, CKD  RED FLAGS: None   WEIGHT BEARING RESTRICTIONS: Yes NWB L  FALLS:  Has patient fallen in last 6 months? No  LIVING ENVIRONMENT: Lives with: lives with their family Lives in: House/apartment Stairs: Yes: Internal: 14 steps; on right going up Has following equipment at home: Walker - 2 wheeled  OCCUPATION: N/A since her liver transplant, She loves to  garden and does chair yoga, walking, hiking, camping.  PLOF: Independent  PATIENT GOALS:Patient would like to return to her normal daily activities, including gardening.  NEXT MD VISIT: 11/19/23  OBJECTIVE:  Note: Objective measures were completed at Evaluation unless otherwise noted.  DIAGNOSTIC FINDINGS:  Not obtained.  COGNITION: Overall cognitive status: Within functional limits for tasks assessed     SENSATION: Not tested  POSTURE: Upright posture, L scapula sits in slight elevation and add.  UPPER EXTREMITY ROM: L elbow, wrist and hand [name] WNL  Passive ROM Right eval Left eval Left 11/11/23 Left 11/18/23 PROM  Shoulder flexion  58 90* 90-100*   Shoulder extension  20    Shoulder abduction  46 60* (approximate) 80* (approximate), pain limited   Shoulder adduction      Shoulder internal rotation  At least 60    Shoulder external rotation  neutral At 0* ABD, about 10* ER  At 0* ABD, about 15* ER   Elbow flexion      Elbow extension      Wrist flexion      Wrist extension      Wrist ulnar deviation      Wrist radial deviation      Wrist pronation      Wrist supination      (Blank rows = not tested)  UPPER EXTREMITY MMT: L shoulder deferred, L elbow, wrist and hand at least 3/5.   SHOULDER SPECIAL TESTS: deferred  JOINT MOBILITY TESTING:  deferred  PALPATION:  Patient has increased tension in B UT, TTP and guarded [name] around L shoulder.                                                                                                                             TREATMENT DATE:   12/16/23 Joint mobs and STW to RT shld PROM left shld to tolerance with cuing to relax throughout. AROM supine [name] directions 2 sets 10 STW to left trap and surrounding muscles Ice after 10 min  12/11/23 UBE  L 2 fwd and back Seated row 15# 2 sets 10 Lat pull 15# 2 sets 10 ( PTA assist with pull down ,focus on flexion stretch) Chest press 5# 2 sets 8 Yellow tband rhy stab  on wall 10 x 3 way Rolling ball up wall 10 x 1# supine IR,ER,chest press,flexion 2 sets 10 2# supine shld cirlces 2 sets 10 PROM left shld 2 sets 10 each way     12/09/23 UBE L 2 3 min fwd and backward LUE AROM standing flex  134  abd 105 Finger ladder flex and abd 5 x each-eccentric lowering Cabinet reaching 1 shelf 1# 2 sets 5 flex and abd Seated row 15# 2 sets 10 Lat pull 15# 2 sets 10 ( PTA assist with pull down ,focus on flexion stretch) 1# standing func reaching 8x 3 sets 5# cable pulleys shld ext 2 sets 10 Bicep curl 5# 2 sets 10 Tricep ext  15# 2 sets 10 PROM left shld 2 sets 10 each way    12/02/23 UBE L 2 fwd 2 min backward Finger ladder with ecc lowering 5 x fwd/5 x laterally Abd with cane working to increase ROM-aded for home ( seh was doing but not pushing ROM, plus added flexion LUE AROM standing flex  134     abd 105 Rows & Ext yellow tband 2x10 Shoulder ER/IR AROM 2 sets 10 yellow tband Bicep curl yellow tband 2 sets 10 PROM left shld 2 sets 10 each way                                  11/28/23 AAROM Flex, Ext, IR dowel 2x10 UBE L1 x 2 min each Rows & Ext yellow 2x10 Shoulder ER/IR AROM 3x10 Seated shoulder abd 2x5 PROM/stretching as appropriate [name] directions per protocol  11/25/23  PROM/stretching as appropriate [name] directions per protocol AAROM shoulder flexion 10x5 second holds supine with dowel  AAROM shoulder ABD 10x5 second holds supine with dowel  AAROM shoulder ER 10x5 seconds hold seated with dowel     Education on following pace of protocol instead of under or over-doing, respect healing time needed and make sure we are building good movement patterns instead of allowing for compensations     11/18/23  PROM/stretching as appropriate [name] directions per protocol Scap retractions x20  Upper trap stretch 2x30 seconds B  Levator stretch 2x30 seconds B     Education on progress towards goals, direction of LTGs and overall goals  of POC, current restrictions post-op and PT hopeful that restriction will be progressed at MD visit tomorrow. Will plan on updating POC/visits per week after she sees MD pending his post-op reccs. Education to avoid shoulder shrug exercise as this reinforces bad movement patterns    11/11/23  PROM/stretching as appropriate [name] directions per protocol  Isometrics x10 each direction L shoulder  Seated scap retractions x20       PATIENT EDUCATION: Education details: POC Person educated: Patient Education method: Explanation Education comprehension: verbalized understanding  HOME EXERCISE PROGRAM:  Access Code: [email or web address] URL: [email or web address] Date: 12/04/2023 Prepared by: Maziah Smola  Exercises - Shoulder extension with resistance - Neutral  - 1 x daily - 7 x weekly - 2 sets - 10 reps - Standing Shoulder Row with Anchored Resistance  - 1 x daily - 7 x weekly - 2 sets - 10 reps - Shoulder External Rotation with Anchored Resistance  - 1  x daily - 7 x weekly - 2 sets - 10 reps - Shoulder Internal Rotation with Resistance  - 1 x daily - 7 x weekly - 2 sets - 10 reps - Standing Bicep Curls with Resistance  - 1 x daily - 7 x weekly - 2 sets - 10 reps - Standing Shoulder Abduction AAROM with Dowel  - 2 x daily - 7 x weekly - 1 sets - 10-15 reps   Access Code: [email or web address] URL: [email or web address] Date: 11/28/2023 Prepared by: Debroah Baller  Exercises - Seated Scapular Retraction  - 1 x daily - 7 x weekly - 3 sets - 10 reps - Seated Cervical Retraction  - 1 x daily - 7 x weekly - 3 sets - 10 reps - Supine Shoulder Flexion Extension AAROM with Dowel  - 2 x daily - 7 x weekly - 1 sets - 10 reps - 5 seconds  hold - Supine Shoulder Abduction AAROM with Dowel  - 2 x daily - 7 x weekly - 1 sets - 10 reps - 5 seconds  hold - Seated Shoulder External Rotation AAROM with Dowel  - 2 x daily - 7 x weekly - 1 sets - 10 reps - 5 seconds   hold    ASSESSMENT:  CLINICAL IMPRESSION: pt arrived c/o increased pain after last session so we modified session AROM/PROM/stretching and STW. Tightness and pain but good ROM   OBJECTIVE IMPAIRMENTS: decreased activity tolerance, decreased coordination, decreased mobility, decreased ROM, decreased strength, increased muscle spasms, impaired flexibility, impaired UE functional use, postural dysfunction, and pain.   ACTIVITY LIMITATIONS: carrying, lifting, bed mobility, bathing, toileting, dressing, reach over head, and hygiene/grooming  PARTICIPATION LIMITATIONS: meal prep, cleaning, laundry, driving, shopping, and community activity  PERSONAL FACTORS: Past/current experiences are also affecting patient's functional outcome.   REHAB POTENTIAL: Good  CLINICAL DECISION MAKING: Evolving/moderate complexity  EVALUATION COMPLEXITY: Low   GOALS: Goals reviewed with patient? Yes  SHORT TERM GOALS: Target date: 11/05/23  I with initial HEP Baseline: Goal status: 10/21/23-met  LONG TERM GOALS: Target date: 01/06/24  I with final HEP Baseline:  Goal status: ONGOING 11/18/23  evolving 12/05/23  2.  Patient will recover full active L shoulder ROM in [name] planes of movement Baseline:  Goal status: ONGOING 11/18/23   12/09/23 progressing  3.  Increase L shoulder strength to 5/5 in [name] planes. Baseline:  Goal status: ONGOING 11/18/23  12/09/23 progressing  4.  Patient will be able to use LUE as normal when working in her garden including pulling weeds, hoeing, etc. Baseline:  Goal status: ONGOING 11/18/23  ROM progressing 12/05/23  5.  Patient will report pain < 3/10 with [name] of her daily activities. Baseline:  Goal status: MET 11/18/23 but activities still very limited at this point post-op   PLAN:  PT FREQUENCY: 1-2x/week  PT DURATION: 12 weeks  PLANNED INTERVENTIONS: 97110-Therapeutic exercises, 97530- Therapeutic activity, 97112- Neuromuscular re-education, 97535- Self Care,  97140- Manual therapy, 97016- Vasopneumatic device, [phone]- Ionotophoresis 4mg /ml Dexamethasone, Patient/Family education, Taping, Joint mobilization, Cryotherapy, and Moist heat  PLAN FOR NEXT SESSION: assess how she feels after session today and progress slowly MD 3/26   Patient Details  Name: Alicia [name] MRN: [id number] Date of Birth: 1961/08/16 Referring Provider:  Pearline Cables, MD  Encounter Date: 12/16/2023   Suanne Marker, PTA 12/16/2023, 8:44 AM  [place] [facility] [address] [place], [place], [address] Phone: [phone]   Fax:  [phone]  [facility] at [facility] [address] [place], [place], [address] Phone: [phone]   Fax:  [phone] [facility] at [name] [address] [place], [place], [address] Phone: [phone]   Fax:  [phone]  Patient Details  Name: Alicia [name] MRN: [id number] Date of Birth: Nov 11, 1960 Referring Provider:  Pearline Cables, MD  Encounter Date: 12/16/2023   Suanne Marker, PTA 12/16/2023, 8:44 AM  [place] [facility] [address] [place], [place], [address] Phone: [phone]   Fax:  [phone] at [facility] [address] [place], [place], [address] Phone: [phone]   Fax:  [phone]  Patient Details  Name: Alicia [name] MRN: [id number] Date of Birth: June 10, 1961 Referring Provider:  Pearline Cables, MD  Encounter Date: 12/16/2023   Suanne Marker, PTA 12/16/2023, 8:44 AM  [place] at [facility] [address] [place], [place], [address] Phone: [phone]   Fax:  [phone]

## 2023-12-18 ENCOUNTER — Ambulatory Visit: Payer: Medicare Other | Admitting: Physical Therapy

## 2023-12-18 ENCOUNTER — Encounter: Payer: Self-pay | Admitting: Physical Therapy

## 2023-12-18 DIAGNOSIS — M6281 Muscle weakness (generalized): Secondary | ICD-10-CM

## 2023-12-18 DIAGNOSIS — M25612 Stiffness of left shoulder, not elsewhere classified: Secondary | ICD-10-CM | POA: Diagnosis not present

## 2023-12-18 DIAGNOSIS — Z9889 Other specified postprocedural states: Secondary | ICD-10-CM

## 2023-12-18 DIAGNOSIS — M25512 Pain in left shoulder: Secondary | ICD-10-CM | POA: Diagnosis not present

## 2023-12-18 DIAGNOSIS — M542 Cervicalgia: Secondary | ICD-10-CM | POA: Diagnosis not present

## 2023-12-18 DIAGNOSIS — M25812 Other specified joint disorders, left shoulder: Secondary | ICD-10-CM | POA: Diagnosis not present

## 2023-12-18 DIAGNOSIS — R293 Abnormal posture: Secondary | ICD-10-CM | POA: Diagnosis not present

## 2023-12-18 NOTE — Therapy (Signed)
 OUTPATIENT PHYSICAL THERAPY SHOULDER TREATMENT   Patient Name: Alicia [name] MRN: [id number] DOB:November 19, 1960, 63 y.o., female Today's Date: 12/18/2023       END OF SESSION:  PT End of Session - 12/18/23 0926     Visit Number 13    Date for PT Re-Evaluation 01/06/24    Authorization Type BCBS MCR    Progress Note Due on Visit 16    PT Start Time 0848    PT Stop Time 0926    PT Time Calculation (min) 38 min    Activity Tolerance Patient tolerated treatment well    Behavior During Therapy WFL for tasks assessed/performed                  Past Medical History:  Diagnosis Date   Allergy    Anemia    Asthma    Cellulitis    Cirrhosis (HCC)    GERD (gastroesophageal reflux disease)    Hepatic fibrosis    congenital   History of UTI    chronic as a child   Sarcoidosis    Spleen enlarged    Past Surgical History:  Procedure Laterality Date   COLONOSCOPY     CYST EXCISION Left 05/01/2017   Procedure: LEFT INDEX FINGER CYST EXCISION;  Surgeon: Tarry Kos, MD;  Location: [facility] OR;  Service: Orthopedics;  Laterality: Left;   ESOPHAGOGASTRODUODENOSCOPY     LIVER TRANSPLANT     SPINAL FUSION  12/2007   Double spinal Fusion   VIDEO BRONCHOSCOPY Bilateral 12/08/2015   Procedure: VIDEO BRONCHOSCOPY WITH FLUORO;  Surgeon: Oretha Milch, MD;  Location: [facility] ENDOSCOPY;  Service: Cardiopulmonary;  Laterality: Bilateral;   Patient Active Problem List   Diagnosis Date Noted   Osteoporosis without current pathological fracture 05/25/2018   Status post liver transplant (HCC) 12/25/2017   Syncope 07/17/2017   Other cirrhosis of liver (HCC) 05/10/2017   Immunocompromised patient (HCC) 05/10/2017   Mucous cyst of digit of left hand 04/11/2017   Cellulitis, abdominal wall 03/01/2017   Normocytic anemia 03/01/2017   Hypokalemia 03/01/2017   CKD (chronic kidney disease), stage III (HCC) 03/01/2017   Abdominal wall cellulitis 03/01/2017   Adrenal insufficiency (HCC) 03/01/2017    Hyperammonemia (HCC)    Cellulitis 06/28/2016   Thrombocytopenia (HCC) 06/28/2016   AKI (acute kidney injury) (HCC) 06/28/2016   Multiple pulmonary nodules determined by computed tomography of lung    Chronic cough 09/07/2015   Hypertension, portal (HCC) 09/04/2011   Asthma with chronic obstructive pulmonary disease (COPD) (HCC) 09/04/2011   Vocal cord nodules 07/04/2011   GERD (gastroesophageal reflux disease) 07/04/2011   Sarcoidosis 07/01/2011   Asthma, severe persistent, with upper airway instability 06/13/2011   Congenital hepatic fibrosis 07/03/2000    PCP: Copland, Gwenlyn Found, MD  REFERRING PROVIDER: Francena Hanly, MD   REFERRING DIAG: M75.42 Lt Shoulder Impingement   THERAPY DIAG:  S/P left rotator cuff repair  Stiffness of left shoulder, not elsewhere classified  Muscle weakness (generalized)  Rationale for Evaluation and Treatment: Rehabilitation  ONSET DATE: 10/07/23  SUBJECTIVE:  SUBJECTIVE STATEMENT:  We really overdid it last Thursday, I wasn't just sore had a lot of pain. Can't take anti-inflammatories which doesn't help. I'm still very sore but better, I feel like I got set back a couple of weeks bc I can't do things that I was easily before without pain    PERTINENT HISTORY: Per referral:PT 1 x/week x 4 weeks for gentle PROM only, S/P L SA, Deb, SAD, RCR, DOS 10/07/23 Per referring physician note: Larey Seat in 2023, injured L shoulder. The pain never improved, has gotten worse. PMHx: Hepaptic Fibrosis, liver transplant 31yrs ago Chronic kidney disease  Sarcoidosis Double spinal fusion C4-5 Osteoporosis  Fall on 12/05/21  PAIN:  Are you having pain? Yes: NPRS scale: 1-2/10 surgical shoulder  Pain location: surgical shoulder  Pain description: very sore  Aggravating factors: over  doing with too much weight too quickly  Relieving factors: ice    PRECAUTIONS: Shoulder, Fall, and Other: Liver transplant, CKD  RED FLAGS: None   WEIGHT BEARING RESTRICTIONS: Yes NWB L  FALLS:  Has patient fallen in last 6 months? No  LIVING ENVIRONMENT: Lives with: lives with their family Lives in: House/apartment Stairs: Yes: Internal: 14 steps; on right going up Has following equipment at home: Walker - 2 wheeled  OCCUPATION: N/A since her liver transplant, She loves to garden and does chair yoga, walking, hiking, camping.  PLOF: Independent  PATIENT GOALS:Patient would like to return to her normal daily activities, including gardening.  NEXT MD VISIT: 11/19/23  OBJECTIVE:  Note: Objective measures were completed at Evaluation unless otherwise noted.  DIAGNOSTIC FINDINGS:  Not obtained.  COGNITION: Overall cognitive status: Within functional limits for tasks assessed     SENSATION: Not tested  POSTURE: Upright posture, L scapula sits in slight elevation and add.  UPPER EXTREMITY ROM: L elbow, wrist and hand all WNL  Passive ROM Right eval Left eval Left 11/11/23 Left 11/18/23 PROM Left 12/18/23 AROM supine   Shoulder flexion  58 90* 90-100*  126*  Shoulder extension  20     Shoulder abduction  46 60* (approximate) 80* (approximate), pain limited  94* pain limited   Shoulder adduction       Shoulder internal rotation  At least 60   Hand to belly at 0* ABD   Shoulder external rotation  neutral At 0* ABD, about 10* ER  At 0* ABD, about 15* ER  At 0* ABD about 35* (approximate)  Elbow flexion       Elbow extension       Wrist flexion       Wrist extension       Wrist ulnar deviation       Wrist radial deviation       Wrist pronation       Wrist supination       (Blank rows = not tested)  UPPER EXTREMITY MMT: L shoulder deferred, L elbow, wrist and hand at least 3/5.    12/18/23  L shoulder flexion  4  L shoulder ABD  3+ upper trap compensations  with gravity resisted   L shoulder extension  4  L shoulder ER  3+ at best   L shoulder IR  4+      SHOULDER SPECIAL TESTS: deferred  JOINT MOBILITY TESTING:  deferred  PALPATION:  Patient has increased tension in B UT, TTP and guarded all around L shoulder.  TREATMENT DATE:   12/18/23  MMT and ROM for progress note- see below  PROM/stretching with GH joint mobs to tolerance  2# supine flexion 2x12  2# sidelying ABD 2x12  2# sidelying ER 2x12 with towel tucked under elbow  3 way reaches on wall yellow 2x5 B      12/16/23 Joint mobs and STW to RT shld PROM left shld to tolerance with cuing to relax throughout. AROM supine all directions 2 sets 10 STW to left trap and surrounding muscles Ice after 10 min  12/11/23 UBE L 2 fwd and back Seated row 15# 2 sets 10 Lat pull 15# 2 sets 10 ( PTA assist with pull down ,focus on flexion stretch) Chest press 5# 2 sets 8 Yellow tband rhy stab on wall 10 x 3 way Rolling ball up wall 10 x 1# supine IR,ER,chest press,flexion 2 sets 10 2# supine shld cirlces 2 sets 10 PROM left shld 2 sets 10 each way     12/09/23 UBE L 2 3 min fwd and backward LUE AROM standing flex  134  abd 105 Finger ladder flex and abd 5 x each-eccentric lowering Cabinet reaching 1 shelf 1# 2 sets 5 flex and abd Seated row 15# 2 sets 10 Lat pull 15# 2 sets 10 ( PTA assist with pull down ,focus on flexion stretch) 1# standing func reaching 8x 3 sets 5# cable pulleys shld ext 2 sets 10 Bicep curl 5# 2 sets 10 Tricep ext  15# 2 sets 10 PROM left shld 2 sets 10 each way    12/02/23 UBE L 2 fwd 2 min backward Finger ladder with ecc lowering 5 x fwd/5 x laterally Abd with cane working to increase ROM-aded for home ( seh was doing but not pushing ROM, plus added flexion LUE AROM standing flex  134     abd 105 Rows & Ext  yellow tband 2x10 Shoulder ER/IR AROM 2 sets 10 yellow tband Bicep curl yellow tband 2 sets 10 PROM left shld 2 sets 10 each way                                    PATIENT EDUCATION: Education details: POC Person educated: Patient Education method: Explanation Education comprehension: verbalized understanding  HOME EXERCISE PROGRAM:  Access Code: [email or web address] URL: [email or web address] Date: 12/04/2023 Prepared by: Angela Payseur  Exercises - Shoulder extension with resistance - Neutral  - 1 x daily - 7 x weekly - 2 sets - 10 reps - Standing Shoulder Row with Anchored Resistance  - 1 x daily - 7 x weekly - 2 sets - 10 reps - Shoulder External Rotation with Anchored Resistance  - 1 x daily - 7 x weekly - 2 sets - 10 reps - Shoulder Internal Rotation with Resistance  - 1 x daily - 7 x weekly - 2 sets - 10 reps - Standing Bicep Curls with Resistance  - 1 x daily - 7 x weekly - 2 sets - 10 reps - Standing Shoulder Abduction AAROM with Dowel  - 2 x daily - 7 x weekly - 1 sets - 10-15 reps   Access Code: [email or web address] URL: [email or web address] Date: 11/28/2023 Prepared by: Debroah Baller  Exercises - Seated Scapular Retraction  - 1 x daily - 7 x weekly - 3 sets - 10 reps - Seated Cervical Retraction  - 1 x daily - 7 x weekly - 3 sets -  10 reps - Supine Shoulder Flexion Extension AAROM with Dowel  - 2 x daily - 7 x weekly - 1 sets - 10 reps - 5 seconds  hold - Supine Shoulder Abduction AAROM with Dowel  - 2 x daily - 7 x weekly - 1 sets - 10 reps - 5 seconds  hold - Seated Shoulder External Rotation AAROM with Dowel  - 2 x daily - 7 x weekly - 1 sets - 10 reps - 5 seconds  hold    ASSESSMENT:  CLINICAL IMPRESSION:   Pt arrives today still fairly sore from last workouts- updated some measures prior to MD visit on the 26th. PTA has been updating goals and they are still all reflect her status, she is making progress towards all but has been a bit limited  by pain the past couple of sessions. Will continue to progress as tolerated, encouraged her to continue icing at home.   OBJECTIVE IMPAIRMENTS: decreased activity tolerance, decreased coordination, decreased mobility, decreased ROM, decreased strength, increased muscle spasms, impaired flexibility, impaired UE functional use, postural dysfunction, and pain.   ACTIVITY LIMITATIONS: carrying, lifting, bed mobility, bathing, toileting, dressing, reach over head, and hygiene/grooming  PARTICIPATION LIMITATIONS: meal prep, cleaning, laundry, driving, shopping, and community activity  PERSONAL FACTORS: Past/current experiences are also affecting patient's functional outcome.   REHAB POTENTIAL: Good  CLINICAL DECISION MAKING: Evolving/moderate complexity  EVALUATION COMPLEXITY: Low   GOALS: Goals reviewed with patient? Yes  SHORT TERM GOALS: Target date: 11/05/23  I with initial HEP Baseline: Goal status: 10/21/23-met  LONG TERM GOALS: Target date: 01/06/24  I with final HEP Baseline:  Goal status: ONGOING 11/18/23  evolving 12/05/23  2.  Patient will recover full active L shoulder ROM in all planes of movement Baseline:  Goal status: ONGOING 11/18/23   12/09/23 progressing  3.  Increase L shoulder strength to 5/5 in all planes. Baseline:  Goal status: ONGOING 11/18/23  12/09/23 progressing  4.  Patient will be able to use LUE as normal when working in her garden including pulling weeds, hoeing, etc. Baseline:  Goal status: ONGOING 11/18/23  ROM progressing 12/05/23  5.  Patient will report pain < 3/10 with all of her daily activities. Baseline:  Goal status: MET 11/18/23 but activities still very limited at this point post-op   PLAN:  PT FREQUENCY: 1-2x/week  PT DURATION: 12 weeks  PLANNED INTERVENTIONS: 97110-Therapeutic exercises, 97530- Therapeutic activity, 97112- Neuromuscular re-education, 97535- Self Care, 16109- Manual therapy, 97016- Vasopneumatic device, [phone]-  Ionotophoresis 4mg /ml Dexamethasone, Patient/Family education, Taping, Joint mobilization, Cryotherapy, and Moist heat  PLAN FOR NEXT SESSION: assess how she feels after session today and progress slowly    Patient Details  Name: Lakenya Riendeau MRN: [id number] Date of Birth: August 21, 1961 Referring Provider:  Pearline Cables, MD  Encounter Date: 12/18/2023       Nedra Hai, PT, DPT 12/18/23 9:27 AM  [facility] [address] [place], [place], [address] Phone: [phone]   Fax:  [phone]

## 2023-12-24 DIAGNOSIS — H2513 Age-related nuclear cataract, bilateral: Secondary | ICD-10-CM | POA: Diagnosis not present

## 2023-12-25 ENCOUNTER — Encounter: Payer: Self-pay | Admitting: Physical Therapy

## 2023-12-25 ENCOUNTER — Ambulatory Visit: Admitting: Physical Therapy

## 2023-12-25 DIAGNOSIS — Z9889 Other specified postprocedural states: Secondary | ICD-10-CM

## 2023-12-25 DIAGNOSIS — R293 Abnormal posture: Secondary | ICD-10-CM | POA: Diagnosis not present

## 2023-12-25 DIAGNOSIS — M6281 Muscle weakness (generalized): Secondary | ICD-10-CM

## 2023-12-25 DIAGNOSIS — M25612 Stiffness of left shoulder, not elsewhere classified: Secondary | ICD-10-CM

## 2023-12-25 DIAGNOSIS — M25512 Pain in left shoulder: Secondary | ICD-10-CM

## 2023-12-25 DIAGNOSIS — M25812 Other specified joint disorders, left shoulder: Secondary | ICD-10-CM | POA: Diagnosis not present

## 2023-12-25 DIAGNOSIS — M542 Cervicalgia: Secondary | ICD-10-CM

## 2023-12-25 NOTE — Therapy (Signed)
 OUTPATIENT PHYSICAL THERAPY SHOULDER TREATMENT   Patient Name: Alicia [name] MRN: [id number] DOB:07/07/1961, 63 y.o., female Today's Date: 12/25/2023       END OF SESSION:  PT End of Session - 12/25/23 0847     Visit Number 14    Date for PT Re-Evaluation 01/06/24    Authorization Type BCBS MCR    PT Start Time 0845    PT Stop Time 0928    PT Time Calculation (min) 43 min    Activity Tolerance Patient tolerated treatment well    Behavior During Therapy WFL for tasks assessed/performed                  Past Medical History:  Diagnosis Date   Allergy    Anemia    Asthma    Cellulitis    Cirrhosis (HCC)    GERD (gastroesophageal reflux disease)    Hepatic fibrosis    congenital   History of UTI    chronic as a child   Sarcoidosis    Spleen enlarged    Past Surgical History:  Procedure Laterality Date   COLONOSCOPY     CYST EXCISION Left 05/01/2017   Procedure: LEFT INDEX FINGER CYST EXCISION;  Surgeon: Tarry Kos, MD;  Location: [facility] OR;  Service: Orthopedics;  Laterality: Left;   ESOPHAGOGASTRODUODENOSCOPY     LIVER TRANSPLANT     SPINAL FUSION  12/2007   Double spinal Fusion   VIDEO BRONCHOSCOPY Bilateral 12/08/2015   Procedure: VIDEO BRONCHOSCOPY WITH FLUORO;  Surgeon: Oretha Milch, MD;  Location: [facility] ENDOSCOPY;  Service: Cardiopulmonary;  Laterality: Bilateral;   Patient Active Problem List   Diagnosis Date Noted   Osteoporosis without current pathological fracture 05/25/2018   Status post liver transplant (HCC) 12/25/2017   Syncope 07/17/2017   Other cirrhosis of liver (HCC) 05/10/2017   Immunocompromised patient (HCC) 05/10/2017   Mucous cyst of digit of left hand 04/11/2017   Cellulitis, abdominal wall 03/01/2017   Normocytic anemia 03/01/2017   Hypokalemia 03/01/2017   CKD (chronic kidney disease), stage III (HCC) 03/01/2017   Abdominal wall cellulitis 03/01/2017   Adrenal insufficiency (HCC) 03/01/2017   Hyperammonemia (HCC)     Cellulitis 06/28/2016   Thrombocytopenia (HCC) 06/28/2016   AKI (acute kidney injury) (HCC) 06/28/2016   Multiple pulmonary nodules determined by computed tomography of lung    Chronic cough 09/07/2015   Hypertension, portal (HCC) 09/04/2011   Asthma with chronic obstructive pulmonary disease (COPD) (HCC) 09/04/2011   Vocal cord nodules 07/04/2011   GERD (gastroesophageal reflux disease) 07/04/2011   Sarcoidosis 07/01/2011   Asthma, severe persistent, with upper airway instability 06/13/2011   Congenital hepatic fibrosis 07/03/2000    PCP: Copland, Gwenlyn Found, MD  REFERRING PROVIDER: Francena Hanly, MD   REFERRING DIAG: M75.42 Lt Shoulder Impingement   THERAPY DIAG:  S/P left rotator cuff repair  Stiffness of left shoulder, not elsewhere classified  Muscle weakness (generalized)  Impingement of left shoulder  Cervicalgia  Acute pain of left shoulder  Abnormal posture  Rationale for Evaluation and Treatment: Rehabilitation  ONSET DATE: 10/07/23  SUBJECTIVE:  SUBJECTIVE STATEMENT:  Patient saw the MD this week.  She had the time where she thinks that we pushed her to much with weights and is feeling a little better, she cannot take anti-inflammatories due to liver issues and feels like we need to go slow with any weight, reports pain a 1-2/10 right now   PERTINENT HISTORY: Per referral:PT 1 x/week x 4 weeks for gentle PROM only, S/P L SA, Deb, SAD, RCR, DOS 10/07/23 Per referring physician note: Larey Seat in 2023, injured L shoulder. The pain never improved, has gotten worse. PMHx: Hepaptic Fibrosis, liver transplant 31yrs ago Chronic kidney disease  Sarcoidosis Double spinal fusion C4-5 Osteoporosis  Fall on 12/05/21  PAIN:  Are you having pain? Yes: NPRS scale: 1-2/10 surgical shoulder  Pain  location: surgical shoulder  Pain description: very sore  Aggravating factors: over doing with too much weight too quickly  Relieving factors: ice    PRECAUTIONS: Shoulder, Fall, and Other: Liver transplant, CKD  RED FLAGS: None   WEIGHT BEARING RESTRICTIONS: Yes NWB L  FALLS:  Has patient fallen in last 6 months? No  LIVING ENVIRONMENT: Lives with: lives with their family Lives in: House/apartment Stairs: Yes: Internal: 14 steps; on right going up Has following equipment at home: Walker - 2 wheeled  OCCUPATION: N/A since her liver transplant, She loves to garden and does chair yoga, walking, hiking, camping.  PLOF: Independent  PATIENT GOALS:Patient would like to return to her normal daily activities, including gardening.  NEXT MD VISIT: 11/19/23  OBJECTIVE:  Note: Objective measures were completed at Evaluation unless otherwise noted.  DIAGNOSTIC FINDINGS:  Not obtained.  COGNITION: Overall cognitive status: Within functional limits for tasks assessed     SENSATION: Not tested  POSTURE: Upright posture, L scapula sits in slight elevation and add.  UPPER EXTREMITY ROM: L elbow, wrist and hand all WNL  Passive ROM Right eval Left eval Left 11/11/23 Left 11/18/23 PROM Left 12/18/23 AROM supine   Shoulder flexion  58 90* 90-100*  126*  Shoulder extension  20     Shoulder abduction  46 60* (approximate) 80* (approximate), pain limited  94* pain limited   Shoulder adduction       Shoulder internal rotation  At least 60   Hand to belly at 0* ABD   Shoulder external rotation  neutral At 0* ABD, about 10* ER  At 0* ABD, about 15* ER  At 0* ABD about 35* (approximate)  Elbow flexion       Elbow extension       Wrist flexion       Wrist extension       Wrist ulnar deviation       Wrist radial deviation       Wrist pronation       Wrist supination       (Blank rows = not tested)  UPPER EXTREMITY MMT: L shoulder deferred, L elbow, wrist and hand at least  3/5.    12/18/23  L shoulder flexion  4  L shoulder ABD  3+ upper trap compensations with gravity resisted   L shoulder extension  4  L shoulder ER  3+ at best   L shoulder IR  4+      SHOULDER SPECIAL TESTS: deferred  JOINT MOBILITY TESTING:  deferred  PALPATION:  Patient has increased tension in B UT, TTP and guarded all around L shoulder.  TREATMENT DATE:  12/25/23 UBE level 2 x 4 minutes Shrugs with upper trap and levator stretches Ball rolling up wall Light joint distraction PROM all motions to tolerance Star gazer stretch, showed doorway stretch Supine 2# punch in a small ROM 2# isometric circles 2# serratus punch 1# ER/IR at a few different angle of abduction 1# sidelying abduction 1# ER side lying   12/18/23  MMT and ROM for progress note- see below  PROM/stretching with GH joint mobs to tolerance  2# supine flexion 2x12  2# sidelying ABD 2x12  2# sidelying ER 2x12 with towel tucked under elbow  3 way reaches on wall yellow 2x5 B      12/16/23 Joint mobs and STW to RT shld PROM left shld to tolerance with cuing to relax throughout. AROM supine all directions 2 sets 10 STW to left trap and surrounding muscles Ice after 10 min  12/11/23 UBE L 2 fwd and back Seated row 15# 2 sets 10 Lat pull 15# 2 sets 10 ( PTA assist with pull down ,focus on flexion stretch) Chest press 5# 2 sets 8 Yellow tband rhy stab on wall 10 x 3 way Rolling ball up wall 10 x 1# supine IR,ER,chest press,flexion 2 sets 10 2# supine shld cirlces 2 sets 10 PROM left shld 2 sets 10 each way     12/09/23 UBE L 2 3 min fwd and backward LUE AROM standing flex  134  abd 105 Finger ladder flex and abd 5 x each-eccentric lowering Cabinet reaching 1 shelf 1# 2 sets 5 flex and abd Seated row 15# 2 sets 10 Lat pull 15# 2 sets 10 ( PTA assist with pull down  ,focus on flexion stretch) 1# standing func reaching 8x 3 sets 5# cable pulleys shld ext 2 sets 10 Bicep curl 5# 2 sets 10 Tricep ext  15# 2 sets 10 PROM left shld 2 sets 10 each way    12/02/23 UBE L 2 fwd 2 min backward Finger ladder with ecc lowering 5 x fwd/5 x laterally Abd with cane working to increase ROM-aded for home ( seh was doing but not pushing ROM, plus added flexion LUE AROM standing flex  134     abd 105 Rows & Ext yellow tband 2x10 Shoulder ER/IR AROM 2 sets 10 yellow tband Bicep curl yellow tband 2 sets 10 PROM left shld 2 sets 10 each way                                    PATIENT EDUCATION: Education details: POC Person educated: Patient Education method: Explanation Education comprehension: verbalized understanding  HOME EXERCISE PROGRAM:  Access Code: [email or web address] URL: [email or web address] Date: 12/04/2023 Prepared by: Angela Payseur  Exercises - Shoulder extension with resistance - Neutral  - 1 x daily - 7 x weekly - 2 sets - 10 reps - Standing Shoulder Row with Anchored Resistance  - 1 x daily - 7 x weekly - 2 sets - 10 reps - Shoulder External Rotation with Anchored Resistance  - 1 x daily - 7 x weekly - 2 sets - 10 reps - Shoulder Internal Rotation with Resistance  - 1 x daily - 7 x weekly - 2 sets - 10 reps - Standing Bicep Curls with Resistance  - 1 x daily - 7 x weekly - 2 sets - 10 reps - Standing Shoulder Abduction AAROM with Dowel  - 2 x  daily - 7 x weekly - 1 sets - 10-15 reps   Access Code: [email or web address] URL: [email or web address] Date: 11/28/2023 Prepared by: Debroah Baller  Exercises - Seated Scapular Retraction  - 1 x daily - 7 x weekly - 3 sets - 10 reps - Seated Cervical Retraction  - 1 x daily - 7 x weekly - 3 sets - 10 reps - Supine Shoulder Flexion Extension AAROM with Dowel  - 2 x daily - 7 x weekly - 1 sets - 10 reps - 5 seconds  hold - Supine Shoulder Abduction AAROM with Dowel  - 2 x daily -  7 x weekly - 1 sets - 10 reps - 5 seconds  hold - Seated Shoulder External Rotation AAROM with Dowel  - 2 x daily - 7 x weekly - 1 sets - 10 reps - 5 seconds  hold    ASSESSMENT:  CLINICAL IMPRESSION:   Pt reports that she is a little better saw the MD and felt like this was a little set back, wants to be careful with weights and the PROM.   she is making progress towards all but has been a bit limited by pain the past couple of sessions. Will continue to progress as tolerated, encouraged her to continue icing at home.   OBJECTIVE IMPAIRMENTS: decreased activity tolerance, decreased coordination, decreased mobility, decreased ROM, decreased strength, increased muscle spasms, impaired flexibility, impaired UE functional use, postural dysfunction, and pain.   ACTIVITY LIMITATIONS: carrying, lifting, bed mobility, bathing, toileting, dressing, reach over head, and hygiene/grooming  PARTICIPATION LIMITATIONS: meal prep, cleaning, laundry, driving, shopping, and community activity  PERSONAL FACTORS: Past/current experiences are also affecting patient's functional outcome.   REHAB POTENTIAL: Good  CLINICAL DECISION MAKING: Evolving/moderate complexity  EVALUATION COMPLEXITY: Low   GOALS: Goals reviewed with patient? Yes  SHORT TERM GOALS: Target date: 11/05/23  I with initial HEP Baseline: Goal status: 10/21/23-met  LONG TERM GOALS: Target date: 01/06/24  I with final HEP Baseline:  Goal status: ONGOING 11/18/23  evolving 12/05/23  2.  Patient will recover full active L shoulder ROM in all planes of movement Baseline:  Goal status: ONGOING 11/18/23   12/09/23 progressing  3.  Increase L shoulder strength to 5/5 in all planes. Baseline:  Goal status:   12/25/23 progressing  4.  Patient will be able to use LUE as normal when working in her garden including pulling weeds, hoeing, etc. Baseline:  Goal status: ONGOING 11/18/23  ROM progressing 12/25/23  5.  Patient will report pain <  3/10 with all of her daily activities. Baseline:  Goal status: MET 11/18/23 but activities still very limited at this point post-op   PLAN:  PT FREQUENCY: 1-2x/week  PT DURATION: 12 weeks  PLANNED INTERVENTIONS: 97110-Therapeutic exercises, 97530- Therapeutic activity, 97112- Neuromuscular re-education, 97535- Self Care, 32440- Manual therapy, 97016- Vasopneumatic device, [phone]- Ionotophoresis 4mg /ml Dexamethasone, Patient/Family education, Taping, Joint mobilization, Cryotherapy, and Moist heat  PLAN FOR NEXT SESSION: assess how she feels after session today and progress slowly    Patient Details  Name: Alicia [name] MRN: [id number] Date of Birth: 1960-12-11 Referring Provider:  Pearline Cables, MD  Encounter Date: 12/25/2023  Stacie Glaze, PT 12/25/23 8:48 AM  [facility] at [facility] [address] [place], [place], [address] Phone: [phone]   Fax:  [phone]

## 2023-12-26 ENCOUNTER — Telehealth: Payer: Self-pay | Admitting: Pulmonary Disease

## 2023-12-26 NOTE — Telephone Encounter (Signed)
 Fax received from Dr. Sherryll Burger with [facility] to perform a cataract extraction by phacoemulsification with intraocular lens implant and tier 1 shared care refractive package-gso right eye, then left eye using topical anesthesia + IV sedation on 01/09/24 and left eye 01/23/24 on patient.  Patient needs surgery clearance. Patient was seen on 11/04/23 . Office protocol is a risk assessment can be sent to surgeon if patient has been seen in 60 days or less.   Sending to Dr. Francine Graven for risk assessment or recommendations if patient needs to be seen in office prior to surgical procedure.

## 2023-12-28 NOTE — Telephone Encounter (Signed)
 ARISCAT Score for Postoperative Pulmonary Complications Predicts risk of pulmonary complications after surgery, including respiratory failure.  Low risk 1.6% risk of in-hospital post-op pulmonary complications (composite including respiratory failure, respiratory infection, pleural effusion, atelectasis, pneumothorax, bronchospasm, aspiration pneumonitis)  Alicia Comas, MD [facility] Office: [phone]

## 2023-12-29 NOTE — Telephone Encounter (Signed)
 Copy of this encounter was faxed to [facility].

## 2023-12-30 ENCOUNTER — Ambulatory Visit: Attending: Family Medicine | Admitting: Physical Therapy

## 2023-12-30 DIAGNOSIS — M25612 Stiffness of left shoulder, not elsewhere classified: Secondary | ICD-10-CM | POA: Insufficient documentation

## 2023-12-30 DIAGNOSIS — M25812 Other specified joint disorders, left shoulder: Secondary | ICD-10-CM | POA: Insufficient documentation

## 2023-12-30 DIAGNOSIS — M6281 Muscle weakness (generalized): Secondary | ICD-10-CM | POA: Diagnosis not present

## 2023-12-30 DIAGNOSIS — Z9889 Other specified postprocedural states: Secondary | ICD-10-CM | POA: Diagnosis not present

## 2023-12-30 NOTE — Therapy (Signed)
 OUTPATIENT PHYSICAL THERAPY SHOULDER TREATMENT   Patient Name: Alicia [name] MRN: [id number] DOB:[birth year], 63 y.o., female Today's Date: 12/30/2023       END OF SESSION:  PT End of Session - 12/30/23 0844     Visit Number 15    Date for PT Re-Evaluation 01/06/24    Authorization Type BCBS MCR    PT Start Time 0845    PT Stop Time 0925    PT Time Calculation (min) 40 min                  Past Medical History:  Diagnosis Date   Allergy    Anemia    Asthma    Cellulitis    Cirrhosis (HCC)    GERD (gastroesophageal reflux disease)    Hepatic fibrosis    congenital   History of UTI    chronic as a child   Sarcoidosis    Spleen enlarged    Past Surgical History:  Procedure Laterality Date   COLONOSCOPY     CYST EXCISION Left 05/01/2017   Procedure: LEFT INDEX FINGER CYST EXCISION;  Surgeon: Tarry Kos, MD;  Location: [facility] OR;  Service: Orthopedics;  Laterality: Left;   ESOPHAGOGASTRODUODENOSCOPY     LIVER TRANSPLANT     SPINAL FUSION  12/2007   Double spinal Fusion   VIDEO BRONCHOSCOPY Bilateral 12/08/2015   Procedure: VIDEO BRONCHOSCOPY WITH FLUORO;  Surgeon: Oretha Milch, MD;  Location: [facility] ENDOSCOPY;  Service: Cardiopulmonary;  Laterality: Bilateral;   Patient Active Problem List   Diagnosis Date Noted   Osteoporosis without current pathological fracture 05/25/2018   Status post liver transplant (HCC) 12/25/2017   Syncope 07/17/2017   Other cirrhosis of liver (HCC) 05/10/2017   Immunocompromised patient (HCC) 05/10/2017   Mucous cyst of digit of left hand 04/11/2017   Cellulitis, abdominal wall 03/01/2017   Normocytic anemia 03/01/2017   Hypokalemia 03/01/2017   CKD (chronic kidney disease), stage III (HCC) 03/01/2017   Abdominal wall cellulitis 03/01/2017   Adrenal insufficiency (HCC) 03/01/2017   Hyperammonemia (HCC)    Cellulitis 06/28/2016   Thrombocytopenia (HCC) 06/28/2016   AKI (acute kidney injury) (HCC) 06/28/2016   Multiple pulmonary  nodules determined by computed tomography of lung    Chronic cough 09/07/2015   Hypertension, portal (HCC) 09/04/2011   Asthma with chronic obstructive pulmonary disease (COPD) (HCC) 09/04/2011   Vocal cord nodules 07/04/2011   GERD (gastroesophageal reflux disease) 07/04/2011   Sarcoidosis 07/01/2011   Asthma, severe persistent, with upper airway instability 06/13/2011   Congenital hepatic fibrosis 07/03/2000    PCP: Copland, Gwenlyn Found, MD  REFERRING PROVIDER: Francena Hanly, MD   REFERRING DIAG: M75.42 Lt Shoulder Impingement   THERAPY DIAG:  S/P left rotator cuff repair  Stiffness of left shoulder, not elsewhere classified  Muscle weakness (generalized)  Rationale for Evaluation and Treatment: Rehabilitation  ONSET DATE: 10/07/23  SUBJECTIVE:  SUBJECTIVE STATEMENT:  Mentally getting frustrated. Doing HEP and trying to duplicate ex from here at home. Doing some gardening  PERTINENT HISTORY: Per referral:PT 1 x/week x 4 weeks for gentle PROM only, S/P L SA, Deb, SAD, RCR, DOS 10/07/23 Per referring physician note: Larey Seat in 2023, injured L shoulder. The pain never improved, has gotten worse. PMHx: Hepaptic Fibrosis, liver transplant 1yrs ago Chronic kidney disease  Sarcoidosis Double spinal fusion C4-5 Osteoporosis  Fall on 12/05/21  PAIN:  Are you having pain? Yes: NPRS scale: 1-2/10 surgical shoulder  Pain location: surgical shoulder  Pain description: very sore  Aggravating factors: over doing with too much weight too quickly  Relieving factors: ice    PRECAUTIONS: Shoulder, Fall, and Other: Liver transplant, CKD  RED FLAGS: None   WEIGHT BEARING RESTRICTIONS: Yes NWB L  FALLS:  Has patient fallen in last 6 months? No  LIVING ENVIRONMENT: Lives with: lives with their family Lives  in: House/apartment Stairs: Yes: Internal: 14 steps; on right going up Has following equipment at home: Walker - 2 wheeled  OCCUPATION: N/A since her liver transplant, She loves to garden and does chair yoga, walking, hiking, camping.  PLOF: Independent  PATIENT GOALS:Patient would like to return to her normal daily activities, including gardening.  NEXT MD VISIT: 11/19/23  OBJECTIVE:  Note: Objective measures were completed at Evaluation unless otherwise noted.  DIAGNOSTIC FINDINGS:  Not obtained.  COGNITION: Overall cognitive status: Within functional limits for tasks assessed     SENSATION: Not tested  POSTURE: Upright posture, L scapula sits in slight elevation and add.  UPPER EXTREMITY ROM: L elbow, wrist and hand all WNL  Passive ROM Right eval Left eval Left 11/11/23 Left 11/18/23 PROM Left 12/18/23 AROM supine   Shoulder flexion  58 90* 90-100*  126*  Shoulder extension  20     Shoulder abduction  46 60* (approximate) 80* (approximate), pain limited  94* pain limited   Shoulder adduction       Shoulder internal rotation  At least 60   Hand to belly at 0* ABD   Shoulder external rotation  neutral At 0* ABD, about 10* ER  At 0* ABD, about 15* ER  At 0* ABD about 35* (approximate)  Elbow flexion       Elbow extension       Wrist flexion       Wrist extension       Wrist ulnar deviation       Wrist radial deviation       Wrist pronation       Wrist supination       (Blank rows = not tested)  UPPER EXTREMITY MMT: L shoulder deferred, L elbow, wrist and hand at least 3/5.    12/18/23  L shoulder flexion  4  L shoulder ABD  3+ upper trap compensations with gravity resisted   L shoulder extension  4  L shoulder ER  3+ at best   L shoulder IR  4+      SHOULDER SPECIAL TESTS: deferred  JOINT MOBILITY TESTING:  deferred  PALPATION:  Patient has increased tension in B UT, TTP and guarded all around L shoulder.  TREATMENT DATE:   12/30/23 UBE L 3 2 min fwd/ backward SL LEFT shld 1# abd 10 x, 2# 10 x SL ER shld 1# 10x then 2# 10 x 12# isometric circles 2# serratus punch# ER/IR at a few different angle of abduction 2# rhy stab 10 x on each diagonal  PROM all directions as tolerated Seated 2# row and shld ext 2 sets 10 Seated 1# horz abd 2 sets 10 Ball vs wall 8 x CW and CCW Wt ball chest press 10x then OH 10 x ( yellow ball)      12/25/23 UBE level 2 x 4 minutes Shrugs with upper trap and levator stretches Ball rolling up wall Light joint distraction PROM all motions to tolerance Star gazer stretch, showed doorway stretch Supine 2# punch in a small ROM 2# isometric circles 2# serratus punch 1# ER/IR at a few different angle of abduction 1# sidelying abduction 1# ER side lying   12/18/23  MMT and ROM for progress note- see below  PROM/stretching with GH joint mobs to tolerance  2# supine flexion 2x12  2# sidelying ABD 2x12  2# sidelying ER 2x12 with towel tucked under elbow  3 way reaches on wall yellow 2x5 B      12/16/23 Joint mobs and STW to RT shld PROM left shld to tolerance with cuing to relax throughout. AROM supine all directions 2 sets 10 STW to left trap and surrounding muscles Ice after 10 min  12/11/23 UBE L 2 fwd and back Seated row 15# 2 sets 10 Lat pull 15# 2 sets 10 ( PTA assist with pull down ,focus on flexion stretch) Chest press 5# 2 sets 8 Yellow tband rhy stab on wall 10 x 3 way Rolling ball up wall 10 x 1# supine IR,ER,chest press,flexion 2 sets 10 2# supine shld cirlces 2 sets 10 PROM left shld 2 sets 10 each way     12/09/23 UBE L 2 3 min fwd and backward LUE AROM standing flex  134  abd 105 Finger ladder flex and abd 5 x each-eccentric lowering Cabinet reaching 1 shelf 1# 2 sets 5 flex and abd Seated row 15# 2 sets 10 Lat pull 15# 2 sets 10 ( PTA  assist with pull down ,focus on flexion stretch) 1# standing func reaching 8x 3 sets 5# cable pulleys shld ext 2 sets 10 Bicep curl 5# 2 sets 10 Tricep ext  15# 2 sets 10 PROM left shld 2 sets 10 each way    12/02/23 UBE L 2 fwd 2 min backward Finger ladder with ecc lowering 5 x fwd/5 x laterally Abd with cane working to increase ROM-aded for home ( seh was doing but not pushing ROM, plus added flexion LUE AROM standing flex  134     abd 105 Rows & Ext yellow tband 2x10 Shoulder ER/IR AROM 2 sets 10 yellow tband Bicep curl yellow tband 2 sets 10 PROM left shld 2 sets 10 each way                                    PATIENT EDUCATION: Education details: POC Person educated: Patient Education method: Explanation Education comprehension: verbalized understanding  HOME EXERCISE PROGRAM:  Access Code: [email or web address] URL: [email or web address] Date: 12/04/2023 Prepared by: Shalunda Lindh  Exercises - Shoulder extension with resistance - Neutral  - 1 x daily - 7 x weekly - 2 sets - 10  reps - Standing Shoulder Row with Anchored Resistance  - 1 x daily - 7 x weekly - 2 sets - 10 reps - Shoulder External Rotation with Anchored Resistance  - 1 x daily - 7 x weekly - 2 sets - 10 reps - Shoulder Internal Rotation with Resistance  - 1 x daily - 7 x weekly - 2 sets - 10 reps - Standing Bicep Curls with Resistance  - 1 x daily - 7 x weekly - 2 sets - 10 reps - Standing Shoulder Abduction AAROM with Dowel  - 2 x daily - 7 x weekly - 1 sets - 10-15 reps   Access Code: [email or web address] URL: [email or web address] Date: 11/28/2023 Prepared by: Debroah Baller  Exercises - Seated Scapular Retraction  - 1 x daily - 7 x weekly - 3 sets - 10 reps - Seated Cervical Retraction  - 1 x daily - 7 x weekly - 3 sets - 10 reps - Supine Shoulder Flexion Extension AAROM with Dowel  - 2 x daily - 7 x weekly - 1 sets - 10 reps - 5 seconds  hold - Supine Shoulder Abduction AAROM with  Dowel  - 2 x daily - 7 x weekly - 1 sets - 10 reps - 5 seconds  hold - Seated Shoulder External Rotation AAROM with Dowel  - 2 x daily - 7 x weekly - 1 sets - 10 reps - 5 seconds  hold    ASSESSMENT:  CLINICAL IMPRESSION:   Pt arrives stating mentally she is frustrated with feeling like she is not progressing but afraid of pain as she can not take antiinflammatories. We discussed again that pain is normal esp as we progress and that this is a long process of recovery. Progressed ex with verb and tactile cuing.  OBJECTIVE IMPAIRMENTS: decreased activity tolerance, decreased coordination, decreased mobility, decreased ROM, decreased strength, increased muscle spasms, impaired flexibility, impaired UE functional use, postural dysfunction, and pain.   ACTIVITY LIMITATIONS: carrying, lifting, bed mobility, bathing, toileting, dressing, reach over head, and hygiene/grooming  PARTICIPATION LIMITATIONS: meal prep, cleaning, laundry, driving, shopping, and community activity  PERSONAL FACTORS: Past/current experiences are also affecting patient's functional outcome.   REHAB POTENTIAL: Good  CLINICAL DECISION MAKING: Evolving/moderate complexity  EVALUATION COMPLEXITY: Low   GOALS: Goals reviewed with patient? Yes  SHORT TERM GOALS: Target date: 11/05/23  I with initial HEP Baseline: Goal status: 10/21/23-met  LONG TERM GOALS: Target date: 01/06/24  I with final HEP Baseline:  Goal status: ONGOING 11/18/23  evolving 12/05/23  2.  Patient will recover full active L shoulder ROM in all planes of movement Baseline:  Goal status: ONGOING 11/18/23   12/09/23 progressing  3.  Increase L shoulder strength to 5/5 in all planes. Baseline:  Goal status:   12/25/23 progressing  4.  Patient will be able to use LUE as normal when working in her garden including pulling weeds, hoeing, etc. Baseline:  Goal status: ONGOING 11/18/23  ROM progressing 12/25/23  5.  Patient will report pain < 3/10 with  all of her daily activities. Baseline:  Goal status: MET 11/18/23 but activities still very limited at this point post-op   PLAN:  PT FREQUENCY: 1-2x/week  PT DURATION: 12 weeks  PLANNED INTERVENTIONS: 97110-Therapeutic exercises, 97530- Therapeutic activity, 97112- Neuromuscular re-education, 97535- Self Care, 82956- Manual therapy, 97016- Vasopneumatic device, 97033- Ionotophoresis 4mg /ml Dexamethasone, Patient/Family education, Taping, Joint mobilization, Cryotherapy, and Moist heat  PLAN FOR NEXT SESSION: assess how she feels after session today and progress  slowly. ASSESS GOALS AND ROM    Patient Details  Name: Alicia [name] MRN: [id number] Date of Birth: [birth year] Referring Provider:  Pearline Cables, MD   [facility] [place], [place], [address] Phone: [phone]   Fax:  [phone] [address] [place], [place], [address] Phone: [phone]   Fax:  [phone]   Suanne Marker, PTA 12/30/2023, 9:14 AM

## 2024-01-01 ENCOUNTER — Ambulatory Visit: Admitting: Physical Therapy

## 2024-01-01 ENCOUNTER — Encounter: Payer: Self-pay | Admitting: Physical Therapy

## 2024-01-01 ENCOUNTER — Other Ambulatory Visit: Payer: Self-pay | Admitting: Pulmonary Disease

## 2024-01-01 DIAGNOSIS — M6281 Muscle weakness (generalized): Secondary | ICD-10-CM | POA: Diagnosis not present

## 2024-01-01 DIAGNOSIS — M25812 Other specified joint disorders, left shoulder: Secondary | ICD-10-CM

## 2024-01-01 DIAGNOSIS — Z9889 Other specified postprocedural states: Secondary | ICD-10-CM | POA: Diagnosis not present

## 2024-01-01 DIAGNOSIS — M25612 Stiffness of left shoulder, not elsewhere classified: Secondary | ICD-10-CM

## 2024-01-01 NOTE — Therapy (Signed)
 OUTPATIENT PHYSICAL THERAPY SHOULDER TREATMENT/PROGRESS NOTE/RECERT    Patient Name: Alicia [name] MRN: [id number] DOB:12/07/1960, 63 y.o., female Today's Date: 01/01/2024  Progress Note Reporting Period 11/18/23 to 01/01/24  See note below for Objective Data and Assessment of Progress/Goals.          END OF SESSION:  PT End of Session - 01/01/24 0957     Visit Number 16    Date for PT Re-Evaluation 02/26/24    Authorization Type BCBS MCR    Progress Note Due on Visit 26    PT Start Time 0932    PT Stop Time 1011    PT Time Calculation (min) 39 min    Activity Tolerance Patient tolerated treatment well    Behavior During Therapy WFL for tasks assessed/performed                   Past Medical History:  Diagnosis Date   Allergy    Anemia    Asthma    Cellulitis    Cirrhosis (HCC)    GERD (gastroesophageal reflux disease)    Hepatic fibrosis    congenital   History of UTI    chronic as a child   Sarcoidosis    Spleen enlarged    Past Surgical History:  Procedure Laterality Date   COLONOSCOPY     CYST EXCISION Left 05/01/2017   Procedure: LEFT INDEX FINGER CYST EXCISION;  Surgeon: Tarry Kos, MD;  Location: [facility] OR;  Service: Orthopedics;  Laterality: Left;   ESOPHAGOGASTRODUODENOSCOPY     LIVER TRANSPLANT     SPINAL FUSION  12/2007   Double spinal Fusion   VIDEO BRONCHOSCOPY Bilateral 12/08/2015   Procedure: VIDEO BRONCHOSCOPY WITH FLUORO;  Surgeon: Oretha Milch, MD;  Location: [facility] END[facility]COPY;  Service: Cardiopulmonary;  Laterality: Bilateral;   Patient Active Problem List   Diagnosis Date Noted   Osteoporosis without current pathological fracture 05/25/2018   Status post liver transplant (HCC) 12/25/2017   Syncope 07/17/2017   Other cirrhosis of liver (HCC) 05/10/2017   Immunocompromised patient (HCC) 05/10/2017   Mucous cyst of digit of left hand 04/11/2017   Cellulitis, abdominal wall 03/01/2017   Normocytic anemia 03/01/2017   Hypokalemia  03/01/2017   CKD (chronic kidney disease), stage III (HCC) 03/01/2017   Abdominal wall cellulitis 03/01/2017   Adrenal insufficiency (HCC) 03/01/2017   Hyperammonemia (HCC)    Cellulitis 06/28/2016   Thrombocytopenia (HCC) 06/28/2016   AKI (acute kidney injury) (HCC) 06/28/2016   Multiple pulmonary nodules determined by computed tomography of lung    Chronic cough 09/07/2015   Hypertension, portal (HCC) 09/04/2011   Asthma with chronic obstructive pulmonary disease (COPD) (HCC) 09/04/2011   Vocal cord nodules 07/04/2011   GERD (gastroesophageal reflux disease) 07/04/2011   Sarcoidosis 07/01/2011   Asthma, severe persistent, with upper airway instability 06/13/2011   Congenital hepatic fibrosis 07/03/2000    PCP: Copland, Gwenlyn Found, MD  REFERRING PROVIDER: Francena Hanly, MD   REFERRING DIAG: M75.42 Lt Shoulder Impingement   THERAPY DIAG:  S/P left rotator cuff repair - Plan: PT plan of care cert/re-cert  Muscle weakness (generalized) - Plan: PT plan of care cert/re-cert  Stiffness of left shoulder, not elsewhere classified - Plan: PT plan of care cert/re-cert  Impingement of left shoulder - Plan: PT plan of care cert/re-cert  Rationale for Evaluation and Treatment: Rehabilitation  ONSET DATE: 10/07/23  SUBJECTIVE:  SUBJECTIVE STATEMENT:  Doing OK, I can deal with sore but not pain and have been icing at home. I feel like I'm finally getting some strength in it, not shaking as much with some exercises    PERTINENT HISTORY: Per referral:PT 1 x/week x 4 weeks for gentle PROM only, S/P L SA, Deb, SAD, RCR, DOS 10/07/23 Per referring physician note: Larey Seat in 2023, injured L shoulder. The pain never improved, has gotten worse. PMHx: Hepaptic Fibrosis, liver transplant 14yrs ago Chronic kidney disease   Sarcoidosis Double spinal fusion C4-5 Osteoporosis  Fall on 12/05/21  PAIN:  Are you having pain? Yes: NPRS scale: 1-2/10 surgical shoulder  Pain location: surgical shoulder  Pain description: very sore  Aggravating factors: over doing with too much weight too quickly  Relieving factors: ice    PRECAUTIONS: Shoulder, Fall, and Other: Liver transplant, CKD  RED FLAGS: None   WEIGHT BEARING RESTRICTIONS: Yes NWB L  FALLS:  Has patient fallen in last 6 months? No  LIVING ENVIRONMENT: Lives with: lives with their family Lives in: House/apartment Stairs: Yes: Internal: 14 steps; on right going up Has following equipment at home: Walker - 2 wheeled  OCCUPATION: N/A since her liver transplant, She loves to garden and does chair yoga, walking, hiking, camping.  PLOF: Independent  PATIENT GOALS:Patient would like to return to her normal daily activities, including gardening.  NEXT MD VISIT: late April 2025  OBJECTIVE:  Note: Objective measures were completed at Evaluation unless otherwise noted.  DIAGNOSTIC FINDINGS:  Not obtained.  COGNITION: Overall cognitive status: Within functional limits for tasks assessed     SENSATION: Not tested  POSTURE: Upright posture, L scapula sits in slight elevation and add.  UPPER EXTREMITY ROM: L elbow, wrist and hand all WNL  Passive ROM Right eval Left eval Left 11/11/23 Left 11/18/23 PROM Left 12/18/23 AROM supine  Left 01/01/24 AROM supine   Shoulder flexion  58 90* 90-100*  126* 131*  Shoulder extension  20      Shoulder abduction  46 60* (approximate) 80* (approximate), pain limited  94* pain limited  154*  Shoulder adduction        Shoulder internal rotation  At least 60   Hand to belly at 0* ABD    Shoulder external rotation  neutral At 0* ABD, about 10* ER  At 0* ABD, about 15* ER  At 0* ABD about 35* (approximate) At 10* ABD, about 37* (approximate)  Elbow flexion        Elbow extension        Wrist flexion         Wrist extension        Wrist ulnar deviation        Wrist radial deviation        Wrist pronation        Wrist supination        (Blank rows = not tested)  UPPER EXTREMITY MMT: L shoulder deferred, L elbow, wrist and hand at least 3/5.    12/18/23 01/01/24  L shoulder flexion  4 4+  L shoulder ABD  3+ upper trap compensations with gravity resisted  4 less upper trap compensation   L shoulder extension  4 4+  L shoulder ER  3+ at best  3+  L shoulder IR  4+  5     SHOULDER SPECIAL TESTS: deferred  JOINT MOBILITY TESTING:  deferred  PALPATION:  Patient has increased tension in B UT, TTP and guarded all  around L shoulder.                                                                                                                             TREATMENT DATE:    01/01/24  UBE L2 4 min forward/4 min backwards  MMT ROM 3# serratus punch x15 Serratus punch 3# + 10CW and 10CCW circles  Serratus punch 3# alphabet 1x2 rounds  5 way punch on wall yellow wted ball x5 (2 rounds) Supine flexion 3# 2x12  Sidelying ABD 3# 2x8       12/30/23 UBE L 3 2 min fwd/ backward SL LEFT shld 1# abd 10 x, 2# 10 x SL ER shld 1# 10x then 2# 10 x 12# isometric circles 2# serratus punch# ER/IR at a few different angle of abduction 2# rhy stab 10 x on each diagonal  PROM all directions as tolerated Seated 2# row and shld ext 2 sets 10 Seated 1# horz abd 2 sets 10 Ball vs wall 8 x CW and CCW Wt ball chest press 10x then OH 10 x ( yellow ball)      12/25/23 UBE level 2 x 4 minutes Shrugs with upper trap and levator stretches Ball rolling up wall Light joint distraction PROM all motions to tolerance Star gazer stretch, showed doorway stretch Supine 2# punch in a small ROM 2# isometric circles 2# serratus punch 1# ER/IR at a few different angle of abduction 1# sidelying abduction 1# ER side lying                                       PATIENT EDUCATION: Education  details: POC Person educated: Patient Education method: Explanation Education comprehension: verbalized understanding  HOME EXERCISE PROGRAM:  Access Code: [email or web address] URL: [email or web address] Date: 12/04/2023 Prepared by: Angela Payseur  Exercises - Shoulder extension with resistance - Neutral  - 1 x daily - 7 x weekly - 2 sets - 10 reps - Standing Shoulder Row with Anchored Resistance  - 1 x daily - 7 x weekly - 2 sets - 10 reps - Shoulder External Rotation with Anchored Resistance  - 1 x daily - 7 x weekly - 2 sets - 10 reps - Shoulder Internal Rotation with Resistance  - 1 x daily - 7 x weekly - 2 sets - 10 reps - Standing Bicep Curls with Resistance  - 1 x daily - 7 x weekly - 2 sets - 10 reps - Standing Shoulder Abduction AAROM with Dowel  - 2 x daily - 7 x weekly - 1 sets - 10-15 reps   Access Code: [email or web address] URL: [email or web address] Date: 11/28/2023 Prepared by: Debroah Baller  Exercises - Seated Scapular Retraction  - 1 x daily - 7 x weekly - 3 sets - 10 reps - Seated Cervical Retraction  - 1 x daily - 7 x weekly - 3 sets -  10 reps - Supine Shoulder Flexion Extension AAROM with Dowel  - 2 x daily - 7 x weekly - 1 sets - 10 reps - 5 seconds  hold - Supine Shoulder Abduction AAROM with Dowel  - 2 x daily - 7 x weekly - 1 sets - 10 reps - 5 seconds  hold - Seated Shoulder External Rotation AAROM with Dowel  - 2 x daily - 7 x weekly - 1 sets - 10 reps - 5 seconds  hold    ASSESSMENT:  CLINICAL IMPRESSION:   Pt arrives doing well, we warmed up on the UBE today then quickly checked objectives and goals. Still making progress, she still needs lots of encouragement that she is doing well and progressing OK- recovery from this surgery just takes a lot of time. Will continue to progress as reasonable and tolerated.   OBJECTIVE IMPAIRMENTS: decreased activity tolerance, decreased coordination, decreased mobility, decreased ROM, decreased strength,  increased muscle spasms, impaired flexibility, impaired UE functional use, postural dysfunction, and pain.   ACTIVITY LIMITATIONS: carrying, lifting, bed mobility, bathing, toileting, dressing, reach over head, and hygiene/grooming  PARTICIPATION LIMITATIONS: meal prep, cleaning, laundry, driving, shopping, and community activity  PERSONAL FACTORS: Past/current experiences are also affecting patient's functional outcome.   REHAB POTENTIAL: Good  CLINICAL DECISION MAKING: Evolving/moderate complexity  EVALUATION COMPLEXITY: Low   GOALS: Goals reviewed with patient? Yes  SHORT TERM GOALS: Target date: 11/05/23  I with initial HEP Baseline: Goal status: 10/21/23-met  LONG TERM GOALS: Target date: 02/26/2024    I with final HEP Baseline:  Goal status: ONGOING 01/01/24  2.  L shoulder AROM to be within functional range without compensation patterns to allow her to perform all functional tasks at home  Baseline:  Goal status: ONGOING/REVISED 01/01/24 but approaching functional for flexion   3.  Increase L shoulder strength to 5/5 in all planes. Baseline:  Goal status:   ONGOING 01/01/24  4.  Patient will be able to use LUE as normal when working in her garden including pulling weeds, hoeing, etc. Baseline:  Goal status: ONGOING 01/01/24   5.  Patient will report pain < 3/10 with all of her daily activities. Baseline:  Goal status: MET 01/01/24   PLAN:  PT FREQUENCY: 1-2x/week  PT DURATION: 8 weeks  PLANNED INTERVENTIONS: 97110-Therapeutic exercises, 97530- Therapeutic activity, 97112- Neuromuscular re-education, 97535- Self Care, 78295- Manual therapy, 97016- Vasopneumatic device, 97033- Ionotophoresis 4mg /ml Dexamethasone, Patient/Family education, Taping, Joint mobilization, Cryotherapy, and Moist heat  PLAN FOR NEXT SESSION: assess how she feels after session today and progress slowly. Continue working towards optimal level of function/ROM/strength without overdoing      Nedra Hai, PT, DPT 01/01/24 10:11 AM

## 2024-01-08 DIAGNOSIS — Z944 Liver transplant status: Secondary | ICD-10-CM | POA: Diagnosis not present

## 2024-01-08 DIAGNOSIS — D849 Immunodeficiency, unspecified: Secondary | ICD-10-CM | POA: Diagnosis not present

## 2024-01-09 ENCOUNTER — Encounter: Admitting: Physical Therapy

## 2024-01-09 DIAGNOSIS — H2511 Age-related nuclear cataract, right eye: Secondary | ICD-10-CM | POA: Diagnosis not present

## 2024-01-09 DIAGNOSIS — J449 Chronic obstructive pulmonary disease, unspecified: Secondary | ICD-10-CM | POA: Diagnosis not present

## 2024-01-09 DIAGNOSIS — I10 Essential (primary) hypertension: Secondary | ICD-10-CM | POA: Diagnosis not present

## 2024-01-13 ENCOUNTER — Ambulatory Visit: Admitting: Physical Therapy

## 2024-01-13 DIAGNOSIS — M6281 Muscle weakness (generalized): Secondary | ICD-10-CM | POA: Diagnosis not present

## 2024-01-13 DIAGNOSIS — Z9889 Other specified postprocedural states: Secondary | ICD-10-CM | POA: Diagnosis not present

## 2024-01-13 DIAGNOSIS — M25612 Stiffness of left shoulder, not elsewhere classified: Secondary | ICD-10-CM

## 2024-01-13 DIAGNOSIS — M25812 Other specified joint disorders, left shoulder: Secondary | ICD-10-CM | POA: Diagnosis not present

## 2024-01-13 NOTE — Therapy (Signed)
 OUTPATIENT PHYSICAL THERAPY SHOULDER TREATMENT   Patient Name: Alicia [name] MRN: [id number] DOB:June 20, 1961, 63 y.o., female Today's Date: 01/13/2024         END OF SESSION:  PT End of Session - 01/13/24 0842     Visit Number 17    Date for PT Re-Evaluation 02/26/24    Authorization Type BCBS MCR    PT Start Time 0845    PT Stop Time 0930    PT Time Calculation (min) 45 min                   Past Medical History:  Diagnosis Date   Allergy    Anemia    Asthma    Cellulitis    Cirrhosis (HCC)    GERD (gastroesophageal reflux disease)    Hepatic fibrosis    congenital   History of UTI    chronic as a child   Sarcoidosis    Spleen enlarged    Past Surgical History:  Procedure Laterality Date   COLONOSCOPY     CYST EXCISION Left 05/01/2017   Procedure: LEFT INDEX FINGER CYST EXCISION;  Surgeon: Wes Hamman, MD;  Location: [facility] OR;  Service: Orthopedics;  Laterality: Left;   ESOPHAGOGASTRODUODENOSCOPY     LIVER TRANSPLANT     SPINAL FUSION  12/2007   Double spinal Fusion   VIDEO BRONCHOSCOPY Bilateral 12/08/2015   Procedure: VIDEO BRONCHOSCOPY WITH FLUORO;  Surgeon: Lind Repine, MD;  Location: [facility] ENDOSCOPY;  Service: Cardiopulmonary;  Laterality: Bilateral;   Patient Active Problem List   Diagnosis Date Noted   Osteoporosis without current pathological fracture 05/25/2018   Status post liver transplant (HCC) 12/25/2017   Syncope 07/17/2017   Other cirrhosis of liver (HCC) 05/10/2017   Immunocompromised patient (HCC) 05/10/2017   Mucous cyst of digit of left hand 04/11/2017   Cellulitis, abdominal wall 03/01/2017   Normocytic anemia 03/01/2017   Hypokalemia 03/01/2017   CKD (chronic kidney disease), stage III (HCC) 03/01/2017   Abdominal wall cellulitis 03/01/2017   Adrenal insufficiency (HCC) 03/01/2017   Hyperammonemia (HCC)    Cellulitis 06/28/2016   Thrombocytopenia (HCC) 06/28/2016   AKI (acute kidney injury) (HCC) 06/28/2016   Multiple  pulmonary nodules determined by computed tomography of lung    Chronic cough 09/07/2015   Hypertension, portal (HCC) 09/04/2011   Asthma with chronic obstructive pulmonary disease (COPD) (HCC) 09/04/2011   Vocal cord nodules 07/04/2011   GERD (gastroesophageal reflux disease) 07/04/2011   Sarcoidosis 07/01/2011   Asthma, severe persistent, with upper airway instability 06/13/2011   Congenital hepatic fibrosis 07/03/2000    PCP: Copland, Skipper Dumas, MD  REFERRING PROVIDER: Ellard Gunning, MD   REFERRING DIAG: M75.42 Lt Shoulder Impingement   THERAPY DIAG:  S/P left rotator cuff repair  Muscle weakness (generalized)  Stiffness of left shoulder, not elsewhere classified  Impingement of left shoulder  Rationale for Evaluation and Treatment: Rehabilitation  ONSET DATE: 10/07/23  SUBJECTIVE:  SUBJECTIVE STATEMENT: Sore but working with it a lot, in garden and household ,also doing HEP PERTINENT HISTORY: Per referral:PT 1 x/week x 4 weeks for gentle PROM only, S/P L SA, Deb, SAD, RCR, DOS 10/07/23 Per referring physician note: Larey Seat in 2023, injured L shoulder. The pain never improved, has gotten worse. PMHx: Hepaptic Fibrosis, liver transplant 64yrs ago Chronic kidney disease  Sarcoidosis Double spinal fusion C4-5 Osteoporosis  Fall on 12/05/21  PAIN:  Are you having pain? Yes: NPRS scale: 1-2/10 surgical shoulder  Pain location: surgical shoulder  Pain description: very sore  Aggravating factors: over doing with too much weight too quickly  Relieving factors: ice    PRECAUTIONS: Shoulder, Fall, and Other: Liver transplant, CKD  RED FLAGS: None   WEIGHT BEARING RESTRICTIONS: Yes NWB L  FALLS:  Has patient fallen in last 6 months? No  LIVING ENVIRONMENT: Lives with: lives with their  family Lives in: House/apartment Stairs: Yes: Internal: 14 steps; on right going up Has following equipment at home: Walker - 2 wheeled  OCCUPATION: N/A since her liver transplant, She loves to garden and does chair yoga, walking, hiking, camping.  PLOF: Independent  PATIENT GOALS:Patient would like to return to her normal daily activities, including gardening.  NEXT MD VISIT: late April 2025  OBJECTIVE:  Note: Objective measures were completed at Evaluation unless otherwise noted.  DIAGNOSTIC FINDINGS:  Not obtained.  COGNITION: Overall cognitive status: Within functional limits for tasks assessed     SENSATION: Not tested  POSTURE: Upright posture, L scapula sits in slight elevation and add.  UPPER EXTREMITY ROM: L elbow, wrist and hand all WNL  Passive ROM Right eval Left eval Left 11/11/23 Left 11/18/23 PROM Left 12/18/23 AROM supine  Left 01/01/24 AROM supine   Shoulder flexion  58 90* 90-100*  126* 131*  Shoulder extension  20      Shoulder abduction  46 60* (approximate) 80* (approximate), pain limited  94* pain limited  154*  Shoulder adduction        Shoulder internal rotation  At least 60   Hand to belly at 0* ABD    Shoulder external rotation  neutral At 0* ABD, about 10* ER  At 0* ABD, about 15* ER  At 0* ABD about 35* (approximate) At 10* ABD, about 37* (approximate)  Elbow flexion        Elbow extension        Wrist flexion        Wrist extension        Wrist ulnar deviation        Wrist radial deviation        Wrist pronation        Wrist supination        (Blank rows = not tested)  UPPER EXTREMITY MMT: L shoulder deferred, L elbow, wrist and hand at least 3/5.    12/18/23 01/01/24  L shoulder flexion  4 4+  L shoulder ABD  3+ upper trap compensations with gravity resisted  4 less upper trap compensation   L shoulder extension  4 4+  L shoulder ER  3+ at best  3+  L shoulder IR  4+  5     SHOULDER SPECIAL TESTS: deferred  JOINT MOBILITY  TESTING:  deferred  PALPATION:  Patient has increased tension in B UT, TTP and guarded all around L shoulder.  TREATMENT DATE:   01/13/24 UBE L 3 3 min each way 3# serratus punch x15 Serratus punch 3# + 10CW and 10CCW circles  Supine flexion 3# 2x12  Sidelying ABD 3# 2x10 SL ER 3# 2 sets 10 Prone ITY 2# 10 x 4# BIL chest press 10 x seated  4# BIL OH 10 x seated 4# BIL flex 10x standing Red tband IR/ER 2 sets 10 standing Ball vs wall 8 x CW and 8 x CCW       01/01/24   UBE L2 4 min forward/4 min backwards  MMT ROM 3# serratus punch x15 Serratus punch 3# + 10CW and 10CCW circles  Serratus punch 3# alphabet 1x2 rounds  5 way punch on wall yellow wted ball x5 (2 rounds) Supine flexion 3# 2x12  Sidelying ABD 3# 2x8       12/30/23 UBE L 3 2 min fwd/ backward SL LEFT shld 1# abd 10 x, 2# 10 x SL ER shld 1# 10x then 2# 10 x 12# isometric circles 2# serratus punch# ER/IR at a few different angle of abduction 2# rhy stab 10 x on each diagonal  PROM all directions as tolerated Seated 2# row and shld ext 2 sets 10 Seated 1# horz abd 2 sets 10 Ball vs wall 8 x CW and CCW Wt ball chest press 10x then OH 10 x ( yellow ball)      12/25/23 UBE level 2 x 4 minutes Shrugs with upper trap and levator stretches Ball rolling up wall Light joint distraction PROM all motions to tolerance Star gazer stretch, showed doorway stretch Supine 2# punch in a small ROM 2# isometric circles 2# serratus punch 1# ER/IR at a few different angle of abduction 1# sidelying abduction 1# ER side lying                                       PATIENT EDUCATION: Education details: POC Person educated: Patient Education method: Explanation Education comprehension: verbalized understanding  HOME EXERCISE PROGRAM:  Access Code: [email or web address] URL:  [email or web address] Date: 12/04/2023 Prepared by: Xaviar Lunn  Exercises - Shoulder extension with resistance - Neutral  - 1 x daily - 7 x weekly - 2 sets - 10 reps - Standing Shoulder Row with Anchored Resistance  - 1 x daily - 7 x weekly - 2 sets - 10 reps - Shoulder External Rotation with Anchored Resistance  - 1 x daily - 7 x weekly - 2 sets - 10 reps - Shoulder Internal Rotation with Resistance  - 1 x daily - 7 x weekly - 2 sets - 10 reps - Standing Bicep Curls with Resistance  - 1 x daily - 7 x weekly - 2 sets - 10 reps - Standing Shoulder Abduction AAROM with Dowel  - 2 x daily - 7 x weekly - 1 sets - 10-15 reps   Access Code: [email or web address] URL: [email or web address] Date: 11/28/2023 Prepared by: Towanda Fret  Exercises - Seated Scapular Retraction  - 1 x daily - 7 x weekly - 3 sets - 10 reps - Seated Cervical Retraction  - 1 x daily - 7 x weekly - 3 sets - 10 reps - Supine Shoulder Flexion Extension AAROM with Dowel  - 2 x daily - 7 x weekly - 1 sets - 10 reps - 5 seconds  hold - Supine Shoulder Abduction AAROM with Dowel  - 2 x daily -  7 x weekly - 1 sets - 10 reps - 5 seconds  hold - Seated Shoulder External Rotation AAROM with Dowel  - 2 x daily - 7 x weekly - 1 sets - 10 reps - 5 seconds  hold    ASSESSMENT:  CLINICAL IMPRESSION:   Pt arrives doing well and verb she is doing a lot more at home with household and gardening and just surprised she is sore- explained this is normal, the more active you are the more you will feel it, okay as long as it stays to "sore" vs pain. We continue to progress strengthening with cuing.  OBJECTIVE IMPAIRMENTS: decreased activity tolerance, decreased coordination, decreased mobility, decreased ROM, decreased strength, increased muscle spasms, impaired flexibility, impaired UE functional use, postural dysfunction, and pain.   ACTIVITY LIMITATIONS: carrying, lifting, bed mobility, bathing, toileting,  dressing, reach over head, and hygiene/grooming  PARTICIPATION LIMITATIONS: meal prep, cleaning, laundry, driving, shopping, and community activity  PERSONAL FACTORS: Past/current experiences are also affecting patient's functional outcome.   REHAB POTENTIAL: Good  CLINICAL DECISION MAKING: Evolving/moderate complexity  EVALUATION COMPLEXITY: Low   GOALS: Goals reviewed with patient? Yes  SHORT TERM GOALS: Target date: 11/05/23  I with initial HEP Baseline: Goal status: 10/21/23-met  LONG TERM GOALS: Target date: 02/26/2024    I with final HEP Baseline:  Goal status: ONGOING 01/01/24  2.  L shoulder AROM to be within functional range without compensation patterns to allow her to perform all functional tasks at home  Baseline:  Goal status: ONGOING/REVISED 01/01/24 but approaching functional for flexion   3.  Increase L shoulder strength to 5/5 in all planes. Baseline:  Goal status:   ONGOING 01/01/24  4.  Patient will be able to use LUE as normal when working in her garden including pulling weeds, hoeing, etc. Baseline:  Goal status: ONGOING 01/01/24   5.  Patient will report pain < 3/10 with all of her daily activities. Baseline:  Goal status: MET 01/01/24   PLAN:  PT FREQUENCY: 1-2x/week  PT DURATION: 8 weeks  PLANNED INTERVENTIONS: 97110-Therapeutic exercises, 97530- Therapeutic activity, 97112- Neuromuscular re-education, 97535- Self Care, 16109- Manual therapy, 97016- Vasopneumatic device, 97033- Ionotophoresis 4mg /ml Dexamethasone, Patient/Family education, Taping, Joint mobilization, Cryotherapy, and Moist heat  PLAN FOR NEXT SESSION: assess how she feels after session today and progress slowly. Continue working towards optimal level of function/ROM/strength without overdoing     Terrel Ferries, PT, DPT 01/13/24 8:43 AM  [facility] at [facility] [address] [facility] [place], [place], [address] Phone: [phone]   Fax:   [phone]  Patient Details  Name: Alicia [name] MRN: [id number] Date of Birth: 03-29-1961 Referring Provider:  Kaylee Partridge, MD  Encounter Date: 01/13/2024   Aquilla Bayley, PTA 01/13/2024, 8:43 AM  [facility] at [facility] [address] [place], [place], [address] Phone: [phone]   Fax:  [phone]

## 2024-01-14 ENCOUNTER — Telehealth: Payer: Self-pay | Admitting: *Deleted

## 2024-01-14 NOTE — Telephone Encounter (Signed)
 Copied from CRM [id number]. Topic: Medical Record Request - Other >> Dec 29, 2023  9:03 AM Ilene Malling wrote: Reason for CRM: Odilia Bennett from [facility] [phone] ext [phone] patient is having catatract surgery right eye 01/09/24 and left eye 01/23/24 and medical clearance request was faxed 12/24/23. Fax# [phone]. Please call back. >> Dec 31, 2023  1:13 PM CMA Opal Bill wrote: Risk assessment was re faxed to [facility]  Duplicate CRM, has already been taken care of, nothing further needed.  Closing encounter.

## 2024-01-14 NOTE — Telephone Encounter (Signed)
 [facility] and spoke with Malachi Screws, he verified that they did receive the surgical clearance and she did have her surgery on 01/09/2024.  Nothing further needed.

## 2024-01-15 ENCOUNTER — Ambulatory Visit: Admitting: Physical Therapy

## 2024-01-15 DIAGNOSIS — M25612 Stiffness of left shoulder, not elsewhere classified: Secondary | ICD-10-CM

## 2024-01-15 DIAGNOSIS — M25812 Other specified joint disorders, left shoulder: Secondary | ICD-10-CM | POA: Diagnosis not present

## 2024-01-15 DIAGNOSIS — Z9889 Other specified postprocedural states: Secondary | ICD-10-CM | POA: Diagnosis not present

## 2024-01-15 DIAGNOSIS — M6281 Muscle weakness (generalized): Secondary | ICD-10-CM | POA: Diagnosis not present

## 2024-01-15 NOTE — Therapy (Signed)
 OUTPATIENT PHYSICAL THERAPY SHOULDER TREATMENT   Patient Name: Alicia [name] MRN: [id number] DOB:[birth year], 63 y.o., female Today's Date: 01/15/2024         END OF SESSION:  PT End of Session - 01/15/24 0846     Visit Number 18    Date for PT Re-Evaluation 02/26/24    Authorization Type BCBS MCR    PT Start Time 0845    PT Stop Time 0930    PT Time Calculation (min) 45 min                   Past Medical History:  Diagnosis Date   Allergy    Anemia    Asthma    Cellulitis    Cirrhosis (HCC)    GERD (gastroesophageal reflux disease)    Hepatic fibrosis    congenital   History of UTI    chronic as a child   Sarcoidosis    Spleen enlarged    Past Surgical History:  Procedure Laterality Date   COLONOSCOPY     CYST EXCISION Left 05/01/2017   Procedure: LEFT INDEX FINGER CYST EXCISION;  Surgeon: Wes Hamman, MD;  Location: [facility] OR;  Service: Orthopedics;  Laterality: Left;   ESOPHAGOGASTRODUODENOSCOPY     LIVER TRANSPLANT     SPINAL FUSION  12/2007   Double spinal Fusion   VIDEO BRONCHOSCOPY Bilateral 12/08/2015   Procedure: VIDEO BRONCHOSCOPY WITH FLUORO;  Surgeon: Lind Repine, MD;  Location: [facility] ENDOSCOPY;  Service: Cardiopulmonary;  Laterality: Bilateral;   Patient Active Problem List   Diagnosis Date Noted   Osteoporosis without current pathological fracture 05/25/2018   Status post liver transplant (HCC) 12/25/2017   Syncope 07/17/2017   Other cirrhosis of liver (HCC) 05/10/2017   Immunocompromised patient (HCC) 05/10/2017   Mucous cyst of digit of left hand 04/11/2017   Cellulitis, abdominal wall 03/01/2017   Normocytic anemia 03/01/2017   Hypokalemia 03/01/2017   CKD (chronic kidney disease), stage III (HCC) 03/01/2017   Abdominal wall cellulitis 03/01/2017   Adrenal insufficiency (HCC) 03/01/2017   Hyperammonemia (HCC)    Cellulitis 06/28/2016   Thrombocytopenia (HCC) 06/28/2016   AKI (acute kidney injury) (HCC) 06/28/2016   Multiple  pulmonary nodules determined by computed tomography of lung    Chronic cough 09/07/2015   Hypertension, portal (HCC) 09/04/2011   Asthma with chronic obstructive pulmonary disease (COPD) (HCC) 09/04/2011   Vocal cord nodules 07/04/2011   GERD (gastroesophageal reflux disease) 07/04/2011   Sarcoidosis 07/01/2011   Asthma, severe persistent, with upper airway instability 06/13/2011   Congenital hepatic fibrosis 07/03/2000    PCP: Copland, Skipper Dumas, MD  REFERRING PROVIDER: Ellard Gunning, MD   REFERRING DIAG: M75.42 Lt Shoulder Impingement   THERAPY DIAG:  S/P left rotator cuff repair  Muscle weakness (generalized)  Stiffness of left shoulder, not elsewhere classified  Rationale for Evaluation and Treatment: Rehabilitation  ONSET DATE: 10/07/23  SUBJECTIVE:  SUBJECTIVE STATEMENT: Sore but no pain, last session was good. Asthma is bad so can we not do UBE PERTINENT HISTORY: Per referral:PT 1 x/week x 4 weeks for gentle PROM only, S/P L SA, Deb, SAD, RCR, DOS 10/07/23 Per referring physician note: Larey Seat in 2023, injured L shoulder. The pain never improved, has gotten worse. PMHx: Hepaptic Fibrosis, liver transplant 50yrs ago Chronic kidney disease  Sarcoidosis Double spinal fusion C4-5 Osteoporosis  Fall on 12/05/21  PAIN:  Are you having pain? Yes: NPRS scale: 1-2/10 surgical shoulder  Pain location: surgical shoulder  Pain description: very sore  Aggravating factors: over doing with too much weight too quickly  Relieving factors: ice    PRECAUTIONS: Shoulder, Fall, and Other: Liver transplant, CKD  RED FLAGS: None   WEIGHT BEARING RESTRICTIONS: Yes NWB L  FALLS:  Has patient fallen in last 6 months? No  LIVING ENVIRONMENT: Lives with: lives with their family Lives in:  House/apartment Stairs: Yes: Internal: 14 steps; on right going up Has following equipment at home: Walker - 2 wheeled  OCCUPATION: N/A since her liver transplant, She loves to garden and does chair yoga, walking, hiking, camping.  PLOF: Independent  PATIENT GOALS:Patient would like to return to her normal daily activities, including gardening.  NEXT MD VISIT: late April 2025  OBJECTIVE:  Note: Objective measures were completed at Evaluation unless otherwise noted.  DIAGNOSTIC FINDINGS:  Not obtained.  COGNITION: Overall cognitive status: Within functional limits for tasks assessed     SENSATION: Not tested  POSTURE: Upright posture, L scapula sits in slight elevation and add.  UPPER EXTREMITY ROM: L elbow, wrist and hand all WNL  Passive ROM Right eval Left eval Left 11/11/23 Left 11/18/23 PROM Left 12/18/23 AROM supine  Left 01/01/24 AROM supine   Shoulder flexion  58 90* 90-100*  126* 131*  Shoulder extension  20      Shoulder abduction  46 60* (approximate) 80* (approximate), pain limited  94* pain limited  154*  Shoulder adduction        Shoulder internal rotation  At least 60   Hand to belly at 0* ABD    Shoulder external rotation  neutral At 0* ABD, about 10* ER  At 0* ABD, about 15* ER  At 0* ABD about 35* (approximate) At 10* ABD, about 37* (approximate)  Elbow flexion        Elbow extension        Wrist flexion        Wrist extension        Wrist ulnar deviation        Wrist radial deviation        Wrist pronation        Wrist supination        (Blank rows = not tested)  UPPER EXTREMITY MMT: L shoulder deferred, L elbow, wrist and hand at least 3/5.    12/18/23 01/01/24  L shoulder flexion  4 4+  L shoulder ABD  3+ upper trap compensations with gravity resisted  4 less upper trap compensation   L shoulder extension  4 4+  L shoulder ER  3+ at best  3+  L shoulder IR  4+  5     SHOULDER SPECIAL TESTS: deferred  JOINT MOBILITY TESTING:   deferred  PALPATION:  Patient has increased tension in B UT, TTP and guarded all around L shoulder.  TREATMENT DATE:    01/15/24 Nustep LUE 2 min push/pull and then Horz abd 2 min Seated row 4# 2 sets 10 Seated shld ext 3# 2 sets 10 Seated Horz ABD 2# 2 sets 10 Standing 4# chest press 10 x.OH 10 x    2 sets AROM LUE standing flexion  156    abd   145     IR   65       ER 70 MMT LUE   WFLs in available range except ER 4+/5- fatigues quickly with resistance Red tband 3 way rhy stab 10 x each BIL Yellow tband shld flexion 10 x and abd 10 x 2 sets  Red tband IR and ER 2 sets 10     01/13/24 UBE L 3 3 min each way 3# serratus punch x15 Serratus punch 3# + 10CW and 10CCW circles  Supine flexion 3# 2x12  Sidelying ABD 3# 2x10 SL ER 3# 2 sets 10 Prone ITY 2# 10 x 4# BIL chest press 10 x seated  4# BIL OH 10 x seated 4# BIL flex 10x standing Red tband IR/ER 2 sets 10 standing Ball vs wall 8 x CW and 8 x CCW       01/01/24   UBE L2 4 min forward/4 min backwards  MMT ROM 3# serratus punch x15 Serratus punch 3# + 10CW and 10CCW circles  Serratus punch 3# alphabet 1x2 rounds  5 way punch on wall yellow wted ball x5 (2 rounds) Supine flexion 3# 2x12  Sidelying ABD 3# 2x8       12/30/23 UBE L 3 2 min fwd/ backward SL LEFT shld 1# abd 10 x, 2# 10 x SL ER shld 1# 10x then 2# 10 x 12# isometric circles 2# serratus punch# ER/IR at a few different angle of abduction 2# rhy stab 10 x on each diagonal  PROM all directions as tolerated Seated 2# row and shld ext 2 sets 10 Seated 1# horz abd 2 sets 10 Ball vs wall 8 x CW and CCW Wt ball chest press 10x then OH 10 x ( yellow ball)      12/25/23 UBE level 2 x 4 minutes Shrugs with upper trap and levator stretches Ball rolling up wall Light joint distraction PROM all motions to  tolerance Star gazer stretch, showed doorway stretch Supine 2# punch in a small ROM 2# isometric circles 2# serratus punch 1# ER/IR at a few different angle of abduction 1# sidelying abduction 1# ER side lying                                       PATIENT EDUCATION: Education details: POC Person educated: Patient Education method: Explanation Education comprehension: verbalized understanding  HOME EXERCISE PROGRAM:  Access Code: [email or web address] URL: [email or web address] Date: 12/04/2023 Prepared by: Dyneisha Murchison  Exercises - Shoulder extension with resistance - Neutral  - 1 x daily - 7 x weekly - 2 sets - 10 reps - Standing Shoulder Row with Anchored Resistance  - 1 x daily - 7 x weekly - 2 sets - 10 reps - Shoulder External Rotation with Anchored Resistance  - 1 x daily - 7 x weekly - 2 sets - 10 reps - Shoulder Internal Rotation with Resistance  - 1 x daily - 7 x weekly - 2 sets - 10 reps - Standing Bicep Curls with Resistance  - 1 x daily -  7 x weekly - 2 sets - 10 reps - Standing Shoulder Abduction AAROM with Dowel  - 2 x daily - 7 x weekly - 1 sets - 10-15 reps   Access Code: [email or web address] URL: [email or web address] Date: 11/28/2023 Prepared by: Debroah Baller  Exercises - Seated Scapular Retraction  - 1 x daily - 7 x weekly - 3 sets - 10 reps - Seated Cervical Retraction  - 1 x daily - 7 x weekly - 3 sets - 10 reps - Supine Shoulder Flexion Extension AAROM with Dowel  - 2 x daily - 7 x weekly - 1 sets - 10 reps - 5 seconds  hold - Supine Shoulder Abduction AAROM with Dowel  - 2 x daily - 7 x weekly - 1 sets - 10 reps - 5 seconds  hold - Seated Shoulder External Rotation AAROM with Dowel  - 2 x daily - 7 x weekly - 1 sets - 10 reps - 5 seconds  hold    ASSESSMENT:  CLINICAL IMPRESSION:  ROM/MMT/assessed goals and Documented Progressing will all goals. MMT tests strong but she does fatigue easily with resistance. Progressing strength to  tolerance without over doing and making sore.    OBJECTIVE IMPAIRMENTS: decreased activity tolerance, decreased coordination, decreased mobility, decreased ROM, decreased strength, increased muscle spasms, impaired flexibility, impaired UE functional use, postural dysfunction, and pain.   ACTIVITY LIMITATIONS: carrying, lifting, bed mobility, bathing, toileting, dressing, reach over head, and hygiene/grooming  PARTICIPATION LIMITATIONS: meal prep, cleaning, laundry, driving, shopping, and community activity  PERSONAL FACTORS: Past/current experiences are also affecting patient's functional outcome.   REHAB POTENTIAL: Good  CLINICAL DECISION MAKING: Evolving/moderate complexity  EVALUATION COMPLEXITY: Low   GOALS: Goals reviewed with patient? Yes  SHORT TERM GOALS: Target date: 11/05/23  I with initial HEP Baseline: Goal status: 10/21/23-met  LONG TERM GOALS: Target date: 02/26/2024    I with final HEP Baseline:  Goal status: ONGOING 01/01/24   01/15/24 evolving  2.  L shoulder AROM to be within functional range without compensation patterns to allow her to perform all functional tasks at home  Baseline:  Goal status: ONGOING/REVISED 01/01/24 but approaching functional for flexion . 01/15/24 PRogressing  3.  Increase L shoulder strength to 5/5 in all planes. Baseline:  Goal status:   ONGOING 01/01/24    01/15/24 progressing  4.  Patient will be able to use LUE as normal when working in her garden including pulling weeds, hoeing, etc. Baseline:  Goal status: ONGOING 01/01/24  Progressing 01/15/24  5.  Patient will report pain < 3/10 with all of her daily activities. Baseline:  Goal status: MET 01/01/24   PLAN:  PT FREQUENCY: 1-2x/week  PT DURATION: 8 weeks  PLANNED INTERVENTIONS: 97110-Therapeutic exercises, 97530- Therapeutic activity, 97112- Neuromuscular re-education, 97535- Self Care, 16109- Manual therapy, 97016- Vasopneumatic device, [phone]- Ionotophoresis 4mg /ml  Dexamethasone, Patient/Family education, Taping, Joint mobilization, Cryotherapy, and Moist heat  PLAN FOR NEXT SESSION: progress strength and func [facility] at [facility] [address] [facility] [place], [place], [address] Phone: [phone]   Fax:  [phone]  Patient Details  Name: Alicia [name] MRN: [id number] Date of Birth: 11/12/1960 Referring Provider:  Pearline Cables, MD  Encounter Date: 01/15/2024   Suanne Marker, PTA 01/15/2024, 8:46 AM  [place] [facility] at [facility] [address] [place], [place], [address] Phone: [phone]   Fax:  [phone] at [facility] [address] [place],  [place], [address] Phone: [phone]   Fax:  [phone]

## 2024-01-20 ENCOUNTER — Encounter: Payer: Self-pay | Admitting: Physical Therapy

## 2024-01-20 ENCOUNTER — Ambulatory Visit: Admitting: Physical Therapy

## 2024-01-20 DIAGNOSIS — M6281 Muscle weakness (generalized): Secondary | ICD-10-CM

## 2024-01-20 DIAGNOSIS — Z9889 Other specified postprocedural states: Secondary | ICD-10-CM | POA: Diagnosis not present

## 2024-01-20 DIAGNOSIS — M25812 Other specified joint disorders, left shoulder: Secondary | ICD-10-CM | POA: Diagnosis not present

## 2024-01-20 DIAGNOSIS — R1319 Other dysphagia: Secondary | ICD-10-CM | POA: Diagnosis not present

## 2024-01-20 DIAGNOSIS — M25612 Stiffness of left shoulder, not elsewhere classified: Secondary | ICD-10-CM | POA: Diagnosis not present

## 2024-01-20 DIAGNOSIS — K219 Gastro-esophageal reflux disease without esophagitis: Secondary | ICD-10-CM | POA: Diagnosis not present

## 2024-01-20 DIAGNOSIS — R053 Chronic cough: Secondary | ICD-10-CM | POA: Diagnosis not present

## 2024-01-20 NOTE — Therapy (Signed)
 OUTPATIENT PHYSICAL THERAPY SHOULDER TREATMENT   Patient Name: Alicia [name] MRN: [id number] DOB:Mar 06, 1961, 63 y.o., female Today's Date: 01/20/2024         END OF SESSION:  PT End of Session - 01/20/24 1515     Visit Number 19    Date for PT Re-Evaluation 02/26/24    Authorization Type BCBS MCR    PT Start Time 1433    PT Stop Time 1511    PT Time Calculation (min) 38 min    Activity Tolerance Patient tolerated treatment well    Behavior During Therapy WFL for tasks assessed/performed                    Past Medical History:  Diagnosis Date   Allergy    Anemia    Asthma    Cellulitis    Cirrhosis (HCC)    GERD (gastroesophageal reflux disease)    Hepatic fibrosis    congenital   History of UTI    chronic as a child   Sarcoidosis    Spleen enlarged    Past Surgical History:  Procedure Laterality Date   COLONOSCOPY     CYST EXCISION Left 05/01/2017   Procedure: LEFT INDEX FINGER CYST EXCISION;  Surgeon: Wes Hamman, MD;  Location: [facility] OR;  Service: Orthopedics;  Laterality: Left;   ESOPHAGOGASTRODUODENOSCOPY     LIVER TRANSPLANT     SPINAL FUSION  12/2007   Double spinal Fusion   VIDEO BRONCHOSCOPY Bilateral 12/08/2015   Procedure: VIDEO BRONCHOSCOPY WITH FLUORO;  Surgeon: Lind Repine, MD;  Location: [facility] ENDOSCOPY;  Service: Cardiopulmonary;  Laterality: Bilateral;   Patient Active Problem List   Diagnosis Date Noted   Osteoporosis without current pathological fracture 05/25/2018   Status post liver transplant (HCC) 12/25/2017   Syncope 07/17/2017   Other cirrhosis of liver (HCC) 05/10/2017   Immunocompromised patient (HCC) 05/10/2017   Mucous cyst of digit of left hand 04/11/2017   Cellulitis, abdominal wall 03/01/2017   Normocytic anemia 03/01/2017   Hypokalemia 03/01/2017   CKD (chronic kidney disease), stage III (HCC) 03/01/2017   Abdominal wall cellulitis 03/01/2017   Adrenal insufficiency (HCC) 03/01/2017   Hyperammonemia (HCC)     Cellulitis 06/28/2016   Thrombocytopenia (HCC) 06/28/2016   AKI (acute kidney injury) (HCC) 06/28/2016   Multiple pulmonary nodules determined by computed tomography of lung    Chronic cough 09/07/2015   Hypertension, portal (HCC) 09/04/2011   Asthma with chronic obstructive pulmonary disease (COPD) (HCC) 09/04/2011   Vocal cord nodules 07/04/2011   GERD (gastroesophageal reflux disease) 07/04/2011   Sarcoidosis 07/01/2011   Asthma, severe persistent, with upper airway instability 06/13/2011   Congenital hepatic fibrosis 07/03/2000    PCP: Copland, Skipper Dumas, MD  REFERRING PROVIDER: Ellard Gunning, MD   REFERRING DIAG: M75.42 Lt Shoulder Impingement   THERAPY DIAG:  S/P left rotator cuff repair  Muscle weakness (generalized)  Stiffness of left shoulder, not elsewhere classified  Impingement of left shoulder  Rationale for Evaluation and Treatment: Rehabilitation  ONSET DATE: 10/07/23  SUBJECTIVE:  SUBJECTIVE STATEMENT:  Have been having spasms in my shoulder, wondering if I overworked it. Feeling tired and worked over today, its been a long day. Have been planting a lot over the past couple days.      PERTINENT HISTORY: Per referral:PT 1 x/week x 4 weeks for gentle PROM only, S/P L SA, Deb, SAD, RCR, DOS 10/07/23 Per referring physician note: Marvell Slider in 2023, injured L shoulder. The pain never improved, has gotten worse. PMHx: Hepaptic Fibrosis, liver transplant 75yrs ago Chronic kidney disease  Sarcoidosis Double spinal fusion C4-5 Osteoporosis  Fall on 12/05/21  PAIN:  Are you having pain? No discomfort, aching, not really pain    PRECAUTIONS: Shoulder, Fall, and Other: Liver transplant, CKD  RED FLAGS: None   WEIGHT BEARING RESTRICTIONS: Yes NWB L  FALLS:  Has patient fallen in last 6  months? No  LIVING ENVIRONMENT: Lives with: lives with their family Lives in: House/apartment Stairs: Yes: Internal: 14 steps; on right going up Has following equipment at home: Walker - 2 wheeled  OCCUPATION: N/A since her liver transplant, She loves to garden and does chair yoga, walking, hiking, camping.  PLOF: Independent  PATIENT GOALS:Patient would like to return to her normal daily activities, including gardening.  NEXT MD VISIT: late April 2025  OBJECTIVE:  Note: Objective measures were completed at Evaluation unless otherwise noted.  DIAGNOSTIC FINDINGS:  Not obtained.  COGNITION: Overall cognitive status: Within functional limits for tasks assessed     SENSATION: Not tested  POSTURE: Upright posture, L scapula sits in slight elevation and add.  UPPER EXTREMITY ROM: L elbow, wrist and hand all WNL  Passive ROM Right eval Left eval Left 11/11/23 Left 11/18/23 PROM Left 12/18/23 AROM supine  Left 01/01/24 AROM supine   Shoulder flexion  58 90* 90-100*  126* 131*  Shoulder extension  20      Shoulder abduction  46 60* (approximate) 80* (approximate), pain limited  94* pain limited  154*  Shoulder adduction        Shoulder internal rotation  At least 60   Hand to belly at 0* ABD    Shoulder external rotation  neutral At 0* ABD, about 10* ER  At 0* ABD, about 15* ER  At 0* ABD about 35* (approximate) At 10* ABD, about 37* (approximate)  Elbow flexion        Elbow extension        Wrist flexion        Wrist extension        Wrist ulnar deviation        Wrist radial deviation        Wrist pronation        Wrist supination        (Blank rows = not tested)  UPPER EXTREMITY MMT: L shoulder deferred, L elbow, wrist and hand at least 3/5.    12/18/23 01/01/24  L shoulder flexion  4 4+  L shoulder ABD  3+ upper trap compensations with gravity resisted  4 less upper trap compensation   L shoulder extension  4 4+  L shoulder ER  3+ at best  3+  L shoulder IR  4+  5      SHOULDER SPECIAL TESTS: deferred  JOINT MOBILITY TESTING:  deferred  PALPATION:  Patient has increased tension in B UT, TTP and guarded all around L shoulder.  TREATMENT DATE:   01/20/24   UBE L3x3 min each way  Wall ladder flexion stretch 12x5 seconds  Wall ladder scaption stretch 12x5 seconds  Thoracic extensions + star gazer x10  Shoulder flexion yellow 1x15  Shoulder scaption yellow 1x15 Wall ball circles CW/CCW 2x10 each  Wall ball alphabet x2  Wall pushups x12      01/15/24 Nustep LUE 2 min push/pull and then Horz abd 2 min Seated row 4# 2 sets 10 Seated shld ext 3# 2 sets 10 Seated Horz ABD 2# 2 sets 10 Standing 4# chest press 10 x.OH 10 x    2 sets AROM LUE standing flexion  156    abd   145     IR   65       ER 70 MMT LUE   WFLs in available range except ER 4+/5- fatigues quickly with resistance Red tband 3 way rhy stab 10 x each BIL Yellow tband shld flexion 10 x and abd 10 x 2 sets  Red tband IR and ER 2 sets 10      PATIENT EDUCATION: Education details: POC Person educated: Patient Education method: Explanation Education comprehension: verbalized understanding  HOME EXERCISE PROGRAM:  Access Code: [email or web address] URL: [email or web address] Date: 12/04/2023 Prepared by: Angela Payseur  Exercises - Shoulder extension with resistance - Neutral  - 1 x daily - 7 x weekly - 2 sets - 10 reps - Standing Shoulder Row with Anchored Resistance  - 1 x daily - 7 x weekly - 2 sets - 10 reps - Shoulder External Rotation with Anchored Resistance  - 1 x daily - 7 x weekly - 2 sets - 10 reps - Shoulder Internal Rotation with Resistance  - 1 x daily - 7 x weekly - 2 sets - 10 reps - Standing Bicep Curls with Resistance  - 1 x daily - 7 x weekly - 2 sets - 10 reps - Standing Shoulder Abduction AAROM with Dowel  - 2 x daily - 7  x weekly - 1 sets - 10-15 reps   Access Code: [email or web address] URL: [email or web address] Date: 11/28/2023 Prepared by: Towanda Fret  Exercises - Seated Scapular Retraction  - 1 x daily - 7 x weekly - 3 sets - 10 reps - Seated Cervical Retraction  - 1 x daily - 7 x weekly - 3 sets - 10 reps - Supine Shoulder Flexion Extension AAROM with Dowel  - 2 x daily - 7 x weekly - 1 sets - 10 reps - 5 seconds  hold - Supine Shoulder Abduction AAROM with Dowel  - 2 x daily - 7 x weekly - 1 sets - 10 reps - 5 seconds  hold - Seated Shoulder External Rotation AAROM with Dowel  - 2 x daily - 7 x weekly - 1 sets - 10 reps - 5 seconds  hold    ASSESSMENT:  CLINICAL IMPRESSION:     Pt arrives today feeling a little more tired due to having a busy day. Spent a little more time on stretching due to spasms and fatigue today and then continued working on functional strengthening as tolerated. Still very motivated to improve and working hard in PT.    OBJECTIVE IMPAIRMENTS: decreased activity tolerance, decreased coordination, decreased mobility, decreased ROM, decreased strength, increased muscle spasms, impaired flexibility, impaired UE functional use, postural dysfunction, and pain.   ACTIVITY LIMITATIONS: carrying, lifting, bed mobility, bathing, toileting, dressing, reach over head, and hygiene/grooming  PARTICIPATION LIMITATIONS: meal prep, cleaning, laundry, driving, shopping, and  community activity  PERSONAL FACTORS: Past/current experiences are also affecting patient's functional outcome.   REHAB POTENTIAL: Good  CLINICAL DECISION MAKING: Evolving/moderate complexity  EVALUATION COMPLEXITY: Low   GOALS: Goals reviewed with patient? Yes  SHORT TERM GOALS: Target date: 11/05/23  I with initial HEP Baseline: Goal status: 10/21/23-met  LONG TERM GOALS: Target date: 02/26/2024    I with final HEP Baseline:  Goal status: ONGOING 01/01/24   01/15/24 evolving  2.  L shoulder AROM  to be within functional range without compensation patterns to allow her to perform all functional tasks at home  Baseline:  Goal status: ONGOING/REVISED 01/01/24 but approaching functional for flexion . 01/15/24 PRogressing  3.  Increase L shoulder strength to 5/5 in all planes. Baseline:  Goal status:   ONGOING 01/01/24    01/15/24 progressing  4.  Patient will be able to use LUE as normal when working in her garden including pulling weeds, hoeing, etc. Baseline:  Goal status: ONGOING 01/01/24  Progressing 01/15/24  5.  Patient will report pain < 3/10 with all of her daily activities. Baseline:  Goal status: MET 01/01/24   PLAN:  PT FREQUENCY: 1-2x/week  PT DURATION: 8 weeks  PLANNED INTERVENTIONS: 97110-Therapeutic exercises, 97530- Therapeutic activity, 97112- Neuromuscular re-education, 97535- Self Care, 16109- Manual therapy, 97016- Vasopneumatic device, [phone]- Ionotophoresis 4mg /ml Dexamethasone, Patient/Family education, Taping, Joint mobilization, Cryotherapy, and Moist heat  PLAN FOR NEXT SESSION: progress strength and function as tolerated     Terrel Ferries, PT, DPT 01/20/24 3:16 PM   [place] [facility] at [facility] [address] [facility] [address] [facility]. [place], [place], [address] Phone: [phone]   Fax:  [phone] at [facility] [address] [place], [place], [address] Phone: [phone]   Fax:  [phone]

## 2024-01-22 ENCOUNTER — Ambulatory Visit: Admitting: Physical Therapy

## 2024-01-22 ENCOUNTER — Encounter: Payer: Self-pay | Admitting: Physical Therapy

## 2024-01-22 DIAGNOSIS — M25812 Other specified joint disorders, left shoulder: Secondary | ICD-10-CM | POA: Diagnosis not present

## 2024-01-22 DIAGNOSIS — Z9889 Other specified postprocedural states: Secondary | ICD-10-CM | POA: Diagnosis not present

## 2024-01-22 DIAGNOSIS — M25612 Stiffness of left shoulder, not elsewhere classified: Secondary | ICD-10-CM

## 2024-01-22 DIAGNOSIS — M6281 Muscle weakness (generalized): Secondary | ICD-10-CM | POA: Diagnosis not present

## 2024-01-22 NOTE — Therapy (Signed)
 OUTPATIENT PHYSICAL THERAPY SHOULDER TREATMENT   Patient Name: Alicia [name] MRN: [id number] DOB:07-25-1961, 63 y.o., female Today's Date: 01/22/2024         END OF SESSION:  PT End of Session - 01/22/24 0910     Visit Number 20    Date for PT Re-Evaluation 02/26/24    Authorization Type BCBS MCR    Progress Note Due on Visit 26    PT Start Time 0849    PT Stop Time 0927    PT Time Calculation (min) 38 min    Activity Tolerance Patient tolerated treatment well    Behavior During Therapy WFL for tasks assessed/performed                     Past Medical History:  Diagnosis Date   Allergy    Anemia    Asthma    Cellulitis    Cirrhosis (HCC)    GERD (gastroesophageal reflux disease)    Hepatic fibrosis    congenital   History of UTI    chronic as a child   Sarcoidosis    Spleen enlarged    Past Surgical History:  Procedure Laterality Date   COLONOSCOPY     CYST EXCISION Left 05/01/2017   Procedure: LEFT INDEX FINGER CYST EXCISION;  Surgeon: Wes Hamman, MD;  Location: [facility] OR;  Service: Orthopedics;  Laterality: Left;   ESOPHAGOGASTRODUODENOSCOPY     LIVER TRANSPLANT     SPINAL FUSION  12/2007   Double spinal Fusion   VIDEO BRONCHOSCOPY Bilateral 12/08/2015   Procedure: VIDEO BRONCHOSCOPY WITH FLUORO;  Surgeon: Lind Repine, MD;  Location: [facility] ENDOSCOPY;  Service: Cardiopulmonary;  Laterality: Bilateral;   Patient Active Problem List   Diagnosis Date Noted   Osteoporosis without current pathological fracture 05/25/2018   Status post liver transplant (HCC) 12/25/2017   Syncope 07/17/2017   Other cirrhosis of liver (HCC) 05/10/2017   Immunocompromised patient (HCC) 05/10/2017   Mucous cyst of digit of left hand 04/11/2017   Cellulitis, abdominal wall 03/01/2017   Normocytic anemia 03/01/2017   Hypokalemia 03/01/2017   CKD (chronic kidney disease), stage III (HCC) 03/01/2017   Abdominal wall cellulitis 03/01/2017   Adrenal insufficiency (HCC)  03/01/2017   Hyperammonemia (HCC)    Cellulitis 06/28/2016   Thrombocytopenia (HCC) 06/28/2016   AKI (acute kidney injury) (HCC) 06/28/2016   Multiple pulmonary nodules determined by computed tomography of lung    Chronic cough 09/07/2015   Hypertension, portal (HCC) 09/04/2011   Asthma with chronic obstructive pulmonary disease (COPD) (HCC) 09/04/2011   Vocal cord nodules 07/04/2011   GERD (gastroesophageal reflux disease) 07/04/2011   Sarcoidosis 07/01/2011   Asthma, severe persistent, with upper airway instability 06/13/2011   Congenital hepatic fibrosis 07/03/2000    PCP: Copland, Skipper Dumas, MD  REFERRING PROVIDER: Ellard Gunning, MD   REFERRING DIAG: M75.42 Lt Shoulder Impingement   THERAPY DIAG:  S/P left rotator cuff repair  Muscle weakness (generalized)  Stiffness of left shoulder, not elsewhere classified  Impingement of left shoulder  Rationale for Evaluation and Treatment: Rehabilitation  ONSET DATE: 10/07/23  SUBJECTIVE:  SUBJECTIVE STATEMENT:  Doing OK, nothing new since last time. See the doctor again soon, hopeful he will fully release me      PERTINENT HISTORY: Per referral:PT 1 x/week x 4 weeks for gentle PROM only, S/P L SA, Deb, SAD, RCR, DOS 10/07/23 Per referring physician note: Marvell Slider in 2023, injured L shoulder. The pain never improved, has gotten worse. PMHx: Hepaptic Fibrosis, liver transplant 46yrs ago Chronic kidney disease  Sarcoidosis Double spinal fusion C4-5 Osteoporosis  Fall on 12/05/21  PAIN:  Are you having pain? No 0/10 only sore from gardening no pain    PRECAUTIONS: Shoulder, Fall, and Other: Liver transplant, CKD  RED FLAGS: None   WEIGHT BEARING RESTRICTIONS: Yes NWB L  FALLS:  Has patient fallen in last 6 months? No  LIVING  ENVIRONMENT: Lives with: lives with their family Lives in: House/apartment Stairs: Yes: Internal: 14 steps; on right going up Has following equipment at home: Walker - 2 wheeled  OCCUPATION: N/A since her liver transplant, She loves to garden and does chair yoga, walking, hiking, camping.  PLOF: Independent  PATIENT GOALS:Patient would like to return to her normal daily activities, including gardening.  NEXT MD VISIT: late April 2025  OBJECTIVE:  Note: Objective measures were completed at Evaluation unless otherwise noted.  DIAGNOSTIC FINDINGS:  Not obtained.  COGNITION: Overall cognitive status: Within functional limits for tasks assessed     SENSATION: Not tested  POSTURE: Upright posture, L scapula sits in slight elevation and add.  UPPER EXTREMITY ROM: L elbow, wrist and hand all WNL  Passive ROM Right eval Left eval Left 11/11/23 Left 11/18/23 PROM Left 12/18/23 AROM supine  Left 01/01/24 AROM supine   Shoulder flexion  58 90* 90-100*  126* 131*  Shoulder extension  20      Shoulder abduction  46 60* (approximate) 80* (approximate), pain limited  94* pain limited  154*  Shoulder adduction        Shoulder internal rotation  At least 60   Hand to belly at 0* ABD    Shoulder external rotation  neutral At 0* ABD, about 10* ER  At 0* ABD, about 15* ER  At 0* ABD about 35* (approximate) At 10* ABD, about 37* (approximate)  Elbow flexion        Elbow extension        Wrist flexion        Wrist extension        Wrist ulnar deviation        Wrist radial deviation        Wrist pronation        Wrist supination        (Blank rows = not tested)  UPPER EXTREMITY MMT: L shoulder deferred, L elbow, wrist and hand at least 3/5.    12/18/23 01/01/24  L shoulder flexion  4 4+  L shoulder ABD  3+ upper trap compensations with gravity resisted  4 less upper trap compensation   L shoulder extension  4 4+  L shoulder ER  3+ at best  3+  L shoulder IR  4+  5     SHOULDER  SPECIAL TESTS: deferred  JOINT MOBILITY TESTING:  deferred  PALPATION:  Patient has increased tension in B UT, TTP and guarded all around L shoulder.  TREATMENT DATE:    01/22/24  Nustep push/pull x2 min, then 2 min horizontal ABD L3  Wall ladder for flexion 12x5 seconds  Wall ladder for scaption 12x5 seconds Shoulder flexion with 2# back to wall to avoid compensations 2x10  Reaches to shelf 3# x12  Shoulder ABD 2# 2x10 to shoulder height  Wall ball CCW/CW x15 each Wall ball alphabet x2 rounds  Machine rows 10# 2x10  Wall biceps stretch 2x30 seconds Shoulder extensions 10# 2x10  Wall snow angels to shoulder height   x10  Backward shoulder rolls x20    01/20/24   UBE L3x3 min each way  Wall ladder flexion stretch 12x5 seconds  Wall ladder scaption stretch 12x5 seconds  Thoracic extensions + star gazer x10  Shoulder flexion yellow 1x15  Shoulder scaption yellow 1x15 Wall ball circles CW/CCW 2x10 each  Wall ball alphabet x2  Wall pushups x12      01/15/24 Nustep LUE 2 min push/pull and then Horz abd 2 min Seated row 4# 2 sets 10 Seated shld ext 3# 2 sets 10 Seated Horz ABD 2# 2 sets 10 Standing 4# chest press 10 x.OH 10 x    2 sets AROM LUE standing flexion  156    abd   145     IR   65       ER 70 MMT LUE   WFLs in available range except ER 4+/5- fatigues quickly with resistance Red tband 3 way rhy stab 10 x each BIL Yellow tband shld flexion 10 x and abd 10 x 2 sets  Red tband IR and ER 2 sets 10      PATIENT EDUCATION: Education details: POC Person educated: Patient Education method: Explanation Education comprehension: verbalized understanding  HOME EXERCISE PROGRAM:  Access Code: [email or web address] URL: [email or web address] Date: 12/04/2023 Prepared by: Angela Payseur  Exercises - Shoulder extension with  resistance - Neutral  - 1 x daily - 7 x weekly - 2 sets - 10 reps - Standing Shoulder Row with Anchored Resistance  - 1 x daily - 7 x weekly - 2 sets - 10 reps - Shoulder External Rotation with Anchored Resistance  - 1 x daily - 7 x weekly - 2 sets - 10 reps - Shoulder Internal Rotation with Resistance  - 1 x daily - 7 x weekly - 2 sets - 10 reps - Standing Bicep Curls with Resistance  - 1 x daily - 7 x weekly - 2 sets - 10 reps - Standing Shoulder Abduction AAROM with Dowel  - 2 x daily - 7 x weekly - 1 sets - 10-15 reps   Access Code: [email or web address] URL: [email or web address] Date: 11/28/2023 Prepared by: Towanda Fret  Exercises - Seated Scapular Retraction  - 1 x daily - 7 x weekly - 3 sets - 10 reps - Seated Cervical Retraction  - 1 x daily - 7 x weekly - 3 sets - 10 reps - Supine Shoulder Flexion Extension AAROM with Dowel  - 2 x daily - 7 x weekly - 1 sets - 10 reps - 5 seconds  hold - Supine Shoulder Abduction AAROM with Dowel  - 2 x daily - 7 x weekly - 1 sets - 10 reps - 5 seconds  hold - Seated Shoulder External Rotation AAROM with Dowel  - 2 x daily - 7 x weekly - 1 sets - 10 reps - 5 seconds  hold    ASSESSMENT:  CLINICAL IMPRESSION:     Pt arrives today doing well,  has been working hard in the garden with some related shoulder soreness, but doing OK. Kept working on ROM and strength as able today. Sees the MD soon, hopeful that he will be pleased with her progress.    OBJECTIVE IMPAIRMENTS: decreased activity tolerance, decreased coordination, decreased mobility, decreased ROM, decreased strength, increased muscle spasms, impaired flexibility, impaired UE functional use, postural dysfunction, and pain.   ACTIVITY LIMITATIONS: carrying, lifting, bed mobility, bathing, toileting, dressing, reach over head, and hygiene/grooming  PARTICIPATION LIMITATIONS: meal prep, cleaning, laundry, driving, shopping, and community activity  PERSONAL FACTORS: Past/current  experiences are also affecting patient's functional outcome.   REHAB POTENTIAL: Good  CLINICAL DECISION MAKING: Evolving/moderate complexity  EVALUATION COMPLEXITY: Low   GOALS: Goals reviewed with patient? Yes  SHORT TERM GOALS: Target date: 11/05/23  I with initial HEP Baseline: Goal status: 10/21/23-met  LONG TERM GOALS: Target date: 02/26/2024    I with final HEP Baseline:  Goal status: ONGOING 01/01/24   01/15/24 evolving  2.  L shoulder AROM to be within functional range without compensation patterns to allow her to perform all functional tasks at home  Baseline:  Goal status: ONGOING/REVISED 01/01/24 but approaching functional for flexion . 01/15/24 PRogressing  3.  Increase L shoulder strength to 5/5 in all planes. Baseline:  Goal status:   ONGOING 01/01/24    01/15/24 progressing  4.  Patient will be able to use LUE as normal when working in her garden including pulling weeds, hoeing, etc. Baseline:  Goal status: ONGOING 01/01/24  Progressing 01/15/24  5.  Patient will report pain < 3/10 with all of her daily activities. Baseline:  Goal status: MET 01/01/24   PLAN:  PT FREQUENCY: 1-2x/week  PT DURATION: 8 weeks  PLANNED INTERVENTIONS: 97110-Therapeutic exercises, 97530- Therapeutic activity, 97112- Neuromuscular re-education, 97535- Self Care, 09811- Manual therapy, 97016- Vasopneumatic device, [phone]- Ionotophoresis 4mg /ml Dexamethasone, Patient/Family education, Taping, Joint mobilization, Cryotherapy, and Moist heat  PLAN FOR NEXT SESSION: progress strength and function as tolerated, any updates from surgeon?     Terrel Ferries, PT, DPT 01/22/24 9:28 AM   [facility] at [facility] [address] [facility]. [place], [place], [address] Phone: [phone]   Fax:  [phone] at [facility] [address]. [place], [place], [address] Phone: [phone]   Fax:  [phone]

## 2024-01-23 DIAGNOSIS — I10 Essential (primary) hypertension: Secondary | ICD-10-CM | POA: Diagnosis not present

## 2024-01-23 DIAGNOSIS — H2512 Age-related nuclear cataract, left eye: Secondary | ICD-10-CM | POA: Diagnosis not present

## 2024-01-23 DIAGNOSIS — H52222 Regular astigmatism, left eye: Secondary | ICD-10-CM | POA: Diagnosis not present

## 2024-01-23 DIAGNOSIS — J449 Chronic obstructive pulmonary disease, unspecified: Secondary | ICD-10-CM | POA: Diagnosis not present

## 2024-01-26 DIAGNOSIS — M7502 Adhesive capsulitis of left shoulder: Secondary | ICD-10-CM | POA: Diagnosis not present

## 2024-01-26 DIAGNOSIS — Z4789 Encounter for other orthopedic aftercare: Secondary | ICD-10-CM | POA: Diagnosis not present

## 2024-01-27 ENCOUNTER — Ambulatory Visit: Admitting: Physical Therapy

## 2024-01-27 DIAGNOSIS — M25612 Stiffness of left shoulder, not elsewhere classified: Secondary | ICD-10-CM | POA: Diagnosis not present

## 2024-01-27 DIAGNOSIS — M6281 Muscle weakness (generalized): Secondary | ICD-10-CM | POA: Diagnosis not present

## 2024-01-27 DIAGNOSIS — M25812 Other specified joint disorders, left shoulder: Secondary | ICD-10-CM | POA: Diagnosis not present

## 2024-01-27 DIAGNOSIS — Z9889 Other specified postprocedural states: Secondary | ICD-10-CM | POA: Diagnosis not present

## 2024-01-27 NOTE — Therapy (Signed)
 OUTPATIENT PHYSICAL THERAPY SHOULDER TREATMENT   Patient Name: Alicia [name] MRN: [id number] DOB:[birth year], 63 y.o., female Today's Date: 01/27/2024         END OF SESSION:  PT End of Session - 01/27/24 0846     Visit Number 21    Date for PT Re-Evaluation 02/26/24    Authorization Type BCBS MCR    PT Start Time 0846    PT Stop Time 0930    PT Time Calculation (min) 44 min                     Past Medical History:  Diagnosis Date   Allergy    Anemia    Asthma    Cellulitis    Cirrhosis (HCC)    GERD (gastroesophageal reflux disease)    Hepatic fibrosis    congenital   History of UTI    chronic as a child   Sarcoidosis    Spleen enlarged    Past Surgical History:  Procedure Laterality Date   COLONOSCOPY     CYST EXCISION Left 05/01/2017   Procedure: LEFT INDEX FINGER CYST EXCISION;  Surgeon: Wes Hamman, MD;  Location: [facility] OR;  Service: Orthopedics;  Laterality: Left;   ESOPHAGOGASTRODUODENOSCOPY     LIVER TRANSPLANT     SPINAL FUSION  12/2007   Double spinal Fusion   VIDEO BRONCHOSCOPY Bilateral 12/08/2015   Procedure: VIDEO BRONCHOSCOPY WITH FLUORO;  Surgeon: Lind Repine, MD;  Location: [facility] ENDOSCOPY;  Service: Cardiopulmonary;  Laterality: Bilateral;   Patient Active Problem List   Diagnosis Date Noted   Osteoporosis without current pathological fracture 05/25/2018   Status post liver transplant (HCC) 12/25/2017   Syncope 07/17/2017   Other cirrhosis of liver (HCC) 05/10/2017   Immunocompromised patient (HCC) 05/10/2017   Mucous cyst of digit of left hand 04/11/2017   Cellulitis, abdominal wall 03/01/2017   Normocytic anemia 03/01/2017   Hypokalemia 03/01/2017   CKD (chronic kidney disease), stage III (HCC) 03/01/2017   Abdominal wall cellulitis 03/01/2017   Adrenal insufficiency (HCC) 03/01/2017   Hyperammonemia (HCC)    Cellulitis 06/28/2016   Thrombocytopenia (HCC) 06/28/2016   AKI (acute kidney injury) (HCC) 06/28/2016    Multiple pulmonary nodules determined by computed tomography of lung    Chronic cough 09/07/2015   Hypertension, portal (HCC) 09/04/2011   Asthma with chronic obstructive pulmonary disease (COPD) (HCC) 09/04/2011   Vocal cord nodules 07/04/2011   GERD (gastroesophageal reflux disease) 07/04/2011   Sarcoidosis 07/01/2011   Asthma, severe persistent, with upper airway instability 06/13/2011   Congenital hepatic fibrosis 07/03/2000    PCP: Copland, Skipper Dumas, MD  REFERRING PROVIDER: Ellard Gunning, MD   REFERRING DIAG: M75.42 Lt Shoulder Impingement   THERAPY DIAG:  S/P left rotator cuff repair  Muscle weakness (generalized)  Stiffness of left shoulder, not elsewhere classified  Rationale for Evaluation and Treatment: Rehabilitation  ONSET DATE: 10/07/23  SUBJECTIVE:  SUBJECTIVE STATEMENT:  Saw MD yesterday, cortisone injection yesterday. 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 Okay to D/C PT May 15th     PERTINENT HISTORY: Per referral:PT 1 x/week x 4 weeks for gentle PROM only, S/P L SA, Deb, SAD, RCR, DOS 10/07/23 Per referring physician note: Marvell Slider in 2023, injured L shoulder. The pain never improved, has gotten worse. PMHx: Hepaptic Fibrosis, liver transplant 13yrs ago Chronic kidney disease  Sarcoidosis Double spinal fusion C4-5 Osteoporosis  Fall on 12/05/21  PAIN:  Are you having pain? No 0/10 only sore from gardening no pain    PRECAUTIONS: Shoulder, Fall, and Other: Liver transplant, CKD  RED FLAGS: None   WEIGHT BEARING RESTRICTIONS: Yes NWB L  FALLS:  Has patient fallen in last 6 months? No  LIVING ENVIRONMENT: Lives with: lives with their family Lives in: House/apartment Stairs: Yes: Internal: 14 steps; on right going up Has following equipment at home: Walker -  2 wheeled  OCCUPATION: N/A since her liver transplant, She loves to garden and does chair yoga, walking, hiking, camping.  PLOF: Independent  PATIENT GOALS:Patient would like to return to her normal daily activities, including gardening.  NEXT MD VISIT: late April 2025  OBJECTIVE:  Note: Objective measures were completed at Evaluation unless otherwise noted.  DIAGNOSTIC FINDINGS:  Not obtained.  COGNITION: Overall cognitive status: Within functional limits for tasks assessed     SENSATION: Not tested  POSTURE: Upright posture, L scapula sits in slight elevation and add.  UPPER EXTREMITY ROM: L elbow, wrist and hand all WNL  Passive ROM Right eval Left eval Left 11/11/23 Left 11/18/23 PROM Left 12/18/23 AROM supine  Left 01/01/24 AROM supine   Shoulder flexion  58 90* 90-100*  126* 131*  Shoulder extension  20      Shoulder abduction  46 60* (approximate) 80* (approximate), pain limited  94* pain limited  154*  Shoulder adduction        Shoulder internal rotation  At least 60   Hand to belly at 0* ABD    Shoulder external rotation  neutral At 0* ABD, about 10* ER  At 0* ABD, about 15* ER  At 0* ABD about 35* (approximate) At 10* ABD, about 37* (approximate)  Elbow flexion        Elbow extension        Wrist flexion        Wrist extension        Wrist ulnar deviation        Wrist radial deviation        Wrist pronation        Wrist supination        (Blank rows = not tested)  UPPER EXTREMITY MMT: L shoulder deferred, L elbow, wrist and hand at least 3/5.    12/18/23 01/01/24  L shoulder flexion  4 4+  L shoulder ABD  3+ upper trap compensations with gravity resisted  4 less upper trap compensation   L shoulder extension  4 4+  L shoulder ER  3+ at best  3+  L shoulder IR  4+  5     SHOULDER SPECIAL TESTS: deferred  JOINT MOBILITY TESTING:  deferred  PALPATION:  Patient has increased tension in B UT, TTP and guarded all around L shoulder.  TREATMENT DATE:   01/27/24 Aggressive PROM supine and then seated with belt mobs Ice after for pain/inflammation   01/22/24  Nustep push/pull x2 min, then 2 min horizontal ABD L3  Wall ladder for flexion 12x5 seconds  Wall ladder for scaption 12x5 seconds Shoulder flexion with 2# back to wall to avoid compensations 2x10  Reaches to shelf 3# x12  Shoulder ABD 2# 2x10 to shoulder height  Wall ball CCW/CW x15 each Wall ball alphabet x2 rounds  Machine rows 10# 2x10  Wall biceps stretch 2x30 seconds Shoulder extensions 10# 2x10  Wall snow angels to shoulder height   x10  Backward shoulder rolls x20    01/20/24   UBE L3x3 min each way  Wall ladder flexion stretch 12x5 seconds  Wall ladder scaption stretch 12x5 seconds  Thoracic extensions + star gazer x10  Shoulder flexion yellow 1x15  Shoulder scaption yellow 1x15 Wall ball circles CW/CCW 2x10 each  Wall ball alphabet x2  Wall pushups x12      01/15/24 Nustep LUE 2 min push/pull and then Horz abd 2 min Seated row 4# 2 sets 10 Seated shld ext 3# 2 sets 10 Seated Horz ABD 2# 2 sets 10 Standing 4# chest press 10 x.OH 10 x    2 sets AROM LUE standing flexion  156    abd   145     IR   65       ER 70 MMT LUE   WFLs in available range except ER 4+/5- fatigues quickly with resistance Red tband 3 way rhy stab 10 x each BIL Yellow tband shld flexion 10 x and abd 10 x 2 sets  Red tband IR and ER 2 sets 10      PATIENT EDUCATION: Education details: POC Person educated: Patient Education method: Explanation Education comprehension: verbalized understanding  HOME EXERCISE PROGRAM:  Access Code: [email or web address] URL: [email or web address] Date: 12/04/2023 Prepared by: Obryan Radu  Exercises - Shoulder extension with resistance - Neutral  - 1 x daily - 7 x weekly - 2 sets - 10 reps - Standing Shoulder  Row with Anchored Resistance  - 1 x daily - 7 x weekly - 2 sets - 10 reps - Shoulder External Rotation with Anchored Resistance  - 1 x daily - 7 x weekly - 2 sets - 10 reps - Shoulder Internal Rotation with Resistance  - 1 x daily - 7 x weekly - 2 sets - 10 reps - Standing Bicep Curls with Resistance  - 1 x daily - 7 x weekly - 2 sets - 10 reps - Standing Shoulder Abduction AAROM with Dowel  - 2 x daily - 7 x weekly - 1 sets - 10-15 reps   Access Code: [email or web address] URL: [email or web address] Date: 11/28/2023 Prepared by: Towanda Fret  Exercises - Seated Scapular Retraction  - 1 x daily - 7 x weekly - 3 sets - 10 reps - Seated Cervical Retraction  - 1 x daily - 7 x weekly - 3 sets - 10 reps - Supine Shoulder Flexion Extension AAROM with Dowel  - 2 x daily - 7 x weekly - 1 sets - 10 reps - 5 seconds  hold - Supine Shoulder Abduction AAROM with Dowel  - 2 x daily - 7 x weekly - 1 sets - 10 reps - 5 seconds  hold - Seated Shoulder External Rotation AAROM with Dowel  - 2 x daily - 7 x weekly - 1 sets - 10 reps - 5 seconds  hold  ASSESSMENT:  CLINICAL IMPRESSION:     Pt arrives today after seeing MD yesterday, she received a cortisone injection after being told she had frozen shld. MD also stated after May 15 she would do PT on her own. MD follow up 2 monthes. Focus session on PROM today- pain and limitations noted but responded better with belt mobs with all minus IR very painful, showed IR stretch at counter for Home. No changes in goals this week as she is just here for todays appt this week.   OBJECTIVE IMPAIRMENTS: decreased activity tolerance, decreased coordination, decreased mobility, decreased ROM, decreased strength, increased muscle spasms, impaired flexibility, impaired UE functional use, postural dysfunction, and pain.   ACTIVITY LIMITATIONS: carrying, lifting, bed mobility, bathing, toileting, dressing, reach over head, and hygiene/grooming  PARTICIPATION  LIMITATIONS: meal prep, cleaning, laundry, driving, shopping, and community activity  PERSONAL FACTORS: Past/current experiences are also affecting patient's functional outcome.   REHAB POTENTIAL: Good  CLINICAL DECISION MAKING: Evolving/moderate complexity  EVALUATION COMPLEXITY: Low   GOALS: Goals reviewed with patient? Yes  SHORT TERM GOALS: Target date: 11/05/23  I with initial HEP Baseline: Goal status: 10/21/23-met  LONG TERM GOALS: Target date: 02/26/2024    I with final HEP Baseline:  Goal status: ONGOING 01/01/24   01/15/24 evolving  2.  L shoulder AROM to be within functional range without compensation patterns to allow her to perform all functional tasks at home  Baseline:  Goal status: ONGOING/REVISED 01/01/24 but approaching functional for flexion . 01/15/24 PRogressing  3.  Increase L shoulder strength to 5/5 in all planes. Baseline:  Goal status:   ONGOING 01/01/24    01/15/24 progressing  4.  Patient will be able to use LUE as normal when working in her garden including pulling weeds, hoeing, etc. Baseline:  Goal status: ONGOING 01/01/24  Progressing 01/15/24  5.  Patient will report pain < 3/10 with all of her daily activities. Baseline:  Goal status: MET 01/01/24   PLAN:  PT FREQUENCY: 1-2x/week  PT DURATION: 8 weeks  PLANNED INTERVENTIONS: 97110-Therapeutic exercises, 97530- Therapeutic activity, 97112- Neuromuscular re-education, 97535- Self Care, 57846- Manual therapy, 97016- Vasopneumatic device, 97033- Ionotophoresis 4mg /ml Dexamethasone, Patient/Family education, Taping, Joint mobilization, Cryotherapy, and Moist heat  PLAN FOR NEXT SESSION: progress strength and function as tolerated, any updates from surgeon?      Breck Hollinger,ANGIE, PTA 01/27/2024, 8:47 AM  [facility] at [facility] [address] [facility] [place], [place], [address] Phone: [phone]   Fax:  [phone]

## 2024-01-29 ENCOUNTER — Ambulatory Visit: Admitting: Physical Therapy

## 2024-02-02 ENCOUNTER — Other Ambulatory Visit: Payer: Self-pay | Admitting: Pulmonary Disease

## 2024-02-03 ENCOUNTER — Encounter: Payer: Self-pay | Admitting: Physical Therapy

## 2024-02-03 ENCOUNTER — Ambulatory Visit: Attending: Family Medicine | Admitting: Physical Therapy

## 2024-02-03 DIAGNOSIS — M25612 Stiffness of left shoulder, not elsewhere classified: Secondary | ICD-10-CM | POA: Diagnosis not present

## 2024-02-03 DIAGNOSIS — M6281 Muscle weakness (generalized): Secondary | ICD-10-CM | POA: Diagnosis not present

## 2024-02-03 DIAGNOSIS — Z9889 Other specified postprocedural states: Secondary | ICD-10-CM | POA: Diagnosis not present

## 2024-02-03 NOTE — Therapy (Signed)
 OUTPATIENT PHYSICAL THERAPY SHOULDER TREATMENT   Patient Name: Alicia [name] MRN: [id number] DOB:06/04/1961, 63 y.o., female Today's Date: 02/03/2024         END OF SESSION:  PT End of Session - 02/03/24 0914     Visit Number 22    Date for PT Re-Evaluation 02/26/24    Authorization Type BCBS MCR    Progress Note Due on Visit 26    PT Start Time 0848    PT Stop Time 0926    PT Time Calculation (min) 38 min    Activity Tolerance Patient tolerated treatment well    Behavior During Therapy WFL for tasks assessed/performed                      Past Medical History:  Diagnosis Date   Allergy    Anemia    Asthma    Cellulitis    Cirrhosis (HCC)    GERD (gastroesophageal reflux disease)    Hepatic fibrosis    congenital   History of UTI    chronic as a child   Sarcoidosis    Spleen enlarged    Past Surgical History:  Procedure Laterality Date   COLONOSCOPY     CYST EXCISION Left 05/01/2017   Procedure: LEFT INDEX FINGER CYST EXCISION;  Surgeon: Wes Hamman, MD;  Location: [facility] OR;  Service: Orthopedics;  Laterality: Left;   ESOPHAGOGASTRODUODENOSCOPY     LIVER TRANSPLANT     SPINAL FUSION  12/2007   Double spinal Fusion   VIDEO BRONCHOSCOPY Bilateral 12/08/2015   Procedure: VIDEO BRONCHOSCOPY WITH FLUORO;  Surgeon: Lind Repine, MD;  Location: [facility] ENDOSCOPY;  Service: Cardiopulmonary;  Laterality: Bilateral;   Patient Active Problem List   Diagnosis Date Noted   Osteoporosis without current pathological fracture 05/25/2018   Status post liver transplant (HCC) 12/25/2017   Syncope 07/17/2017   Other cirrhosis of liver (HCC) 05/10/2017   Immunocompromised patient (HCC) 05/10/2017   Mucous cyst of digit of left hand 04/11/2017   Cellulitis, abdominal wall 03/01/2017   Normocytic anemia 03/01/2017   Hypokalemia 03/01/2017   CKD (chronic kidney disease), stage III (HCC) 03/01/2017   Abdominal wall cellulitis 03/01/2017   Adrenal insufficiency (HCC)  03/01/2017   Hyperammonemia (HCC)    Cellulitis 06/28/2016   Thrombocytopenia (HCC) 06/28/2016   AKI (acute kidney injury) (HCC) 06/28/2016   Multiple pulmonary nodules determined by computed tomography of lung    Chronic cough 09/07/2015   Hypertension, portal (HCC) 09/04/2011   Asthma with chronic obstructive pulmonary disease (COPD) (HCC) 09/04/2011   Vocal cord nodules 07/04/2011   GERD (gastroesophageal reflux disease) 07/04/2011   Sarcoidosis 07/01/2011   Asthma, severe persistent, with upper airway instability 06/13/2011   Congenital hepatic fibrosis 07/03/2000    PCP: Copland, Skipper Dumas, MD  REFERRING PROVIDER: Ellard Gunning, MD   REFERRING DIAG: M75.42 Lt Shoulder Impingement   THERAPY DIAG:  S/P left rotator cuff repair  Muscle weakness (generalized)  Stiffness of left shoulder, not elsewhere classified  Rationale for Evaluation and Treatment: Rehabilitation  ONSET DATE: 10/07/23  SUBJECTIVE:  SUBJECTIVE STATEMENT:  Shot did not take away pain, but pain is better with movement. Not having as much of a catch. He said DC PT on May 15th, I feel OK with that.      PERTINENT HISTORY: Per referral:PT 1 x/week x 4 weeks for gentle PROM only, S/P L SA, Deb, SAD, RCR, DOS 10/07/23 Per referring physician note: Marvell Slider in 2023, injured L shoulder. The pain never improved, has gotten worse. PMHx: Hepaptic Fibrosis, liver transplant 64yrs ago Chronic kidney disease  Sarcoidosis Double spinal fusion C4-5 Osteoporosis  Fall on 12/05/21  PAIN:  Are you having pain? No 0/10- just still sr    PRECAUTIONS: Shoulder, Fall, and Other: Liver transplant, CKD  RED FLAGS: None   WEIGHT BEARING RESTRICTIONS: Yes WBAT   FALLS:  Has patient fallen in last 6 months? No  LIVING ENVIRONMENT: Lives with:  lives with their family Lives in: House/apartment Stairs: Yes: Internal: 14 steps; on right going up Has following equipment at home: Walker - 2 wheeled  OCCUPATION: N/A since her liver transplant, She loves to garden and does chair yoga, walking, hiking, camping.  PLOF: Independent  PATIENT GOALS:Patient would like to return to her normal daily activities, including gardening.  NEXT MD VISIT: late April 2025  OBJECTIVE:  Note: Objective measures were completed at Evaluation unless otherwise noted.  DIAGNOSTIC FINDINGS:  Not obtained.  COGNITION: Overall cognitive status: Within functional limits for tasks assessed     SENSATION: Not tested  POSTURE: Upright posture, L scapula sits in slight elevation and add.  UPPER EXTREMITY ROM: L elbow, wrist and hand all WNL  Passive ROM Right eval Left eval Left 11/11/23 Left 11/18/23 PROM Left 12/18/23 AROM supine  Left 01/01/24 AROM supine   Shoulder flexion  58 90* 90-100*  126* 131*  Shoulder extension  20      Shoulder abduction  46 60* (approximate) 80* (approximate), pain limited  94* pain limited  154*  Shoulder adduction        Shoulder internal rotation  At least 60   Hand to belly at 0* ABD    Shoulder external rotation  neutral At 0* ABD, about 10* ER  At 0* ABD, about 15* ER  At 0* ABD about 35* (approximate) At 10* ABD, about 37* (approximate)  Elbow flexion        Elbow extension        Wrist flexion        Wrist extension        Wrist ulnar deviation        Wrist radial deviation        Wrist pronation        Wrist supination        (Blank rows = not tested)  UPPER EXTREMITY MMT: L shoulder deferred, L elbow, wrist and hand at least 3/5.    12/18/23 01/01/24  L shoulder flexion  4 4+  L shoulder ABD  3+ upper trap compensations with gravity resisted  4 less upper trap compensation   L shoulder extension  4 4+  L shoulder ER  3+ at best  3+  L shoulder IR  4+  5     SHOULDER SPECIAL  TESTS: deferred  JOINT MOBILITY TESTING:  deferred  PALPATION:  Patient has increased tension in B UT, TTP and guarded all around L shoulder.  TREATMENT DATE:    02/03/24  UBE L3x2 min forward, 2 min backward (limited due to asthma, Nustep not available) Wall ladder scaption and flexion 7x5 seconds B Towel IR stretch 10x5 seconds L (added to HEP) Sidelying IR stretch 10x5 seconds L (added to HEP) Standing shoulder flexion against wall 2# x15 Machine rows 10# x20 Standing shoulder ABD 2# x12  Shoulder extension 10# x12 Standing Ys at wall 2# x12  Standing Ts at wall 2# x12  Standing Is at wall 2# x12  Wall ball alphabet x2 rounds       01/27/24 Aggressive PROM supine and then seated with belt mobs Ice after for pain/inflammation   01/22/24  Nustep push/pull x2 min, then 2 min horizontal ABD L3  Wall ladder for flexion 12x5 seconds  Wall ladder for scaption 12x5 seconds Shoulder flexion with 2# back to wall to avoid compensations 2x10  Reaches to shelf 3# x12  Shoulder ABD 2# 2x10 to shoulder height  Wall ball CCW/CW x15 each Wall ball alphabet x2 rounds  Machine rows 10# 2x10  Wall biceps stretch 2x30 seconds Shoulder extensions 10# 2x10  Wall snow angels to shoulder height   x10  Backward shoulder rolls x20    01/20/24   UBE L3x3 min each way  Wall ladder flexion stretch 12x5 seconds  Wall ladder scaption stretch 12x5 seconds  Thoracic extensions + star gazer x10  Shoulder flexion yellow 1x15  Shoulder scaption yellow 1x15 Wall ball circles CW/CCW 2x10 each  Wall ball alphabet x2  Wall pushups x12      01/15/24 Nustep LUE 2 min push/pull and then Horz abd 2 min Seated row 4# 2 sets 10 Seated shld ext 3# 2 sets 10 Seated Horz ABD 2# 2 sets 10 Standing 4# chest press 10 x.OH 10 x    2 sets AROM LUE standing flexion  156     abd   145     IR   65       ER 70 MMT LUE   WFLs in available range except ER 4+/5- fatigues quickly with resistance Red tband 3 way rhy stab 10 x each BIL Yellow tband shld flexion 10 x and abd 10 x 2 sets  Red tband IR and ER 2 sets 10      PATIENT EDUCATION: Education details: POC Person educated: Patient Education method: Explanation Education comprehension: verbalized understanding  HOME EXERCISE PROGRAM:  Access Code: [email or web address] URL: [email or web address] Date: 02/03/2024 Prepared by: Terrel Ferries  Exercises - Shoulder extension with resistance - Neutral  - 1 x daily - 7 x weekly - 2 sets - 10 reps - Standing Shoulder Row with Anchored Resistance  - 1 x daily - 7 x weekly - 2 sets - 10 reps - Shoulder External Rotation with Anchored Resistance  - 1 x daily - 7 x weekly - 2 sets - 10 reps - Shoulder Internal Rotation with Resistance  - 1 x daily - 7 x weekly - 2 sets - 10 reps - Standing Bicep Curls with Resistance  - 1 x daily - 7 x weekly - 2 sets - 10 reps - Standing Shoulder Abduction AAROM with Dowel  - 2 x daily - 7 x weekly - 1 sets - 10-15 reps - Standing Shoulder Internal Rotation Stretch with Towel  - 2 x daily - 7 x weekly - 1 sets - 10 reps - 5 seconds  hold - Sleeper Stretch  - 2 x daily - 7 x weekly -  1 sets - 10 reps - 5 seconds hold   Access Code: [email or web address] URL: [email or web address] Date: 11/28/2023 Prepared by: Towanda Fret  Exercises - Seated Scapular Retraction  - 1 x daily - 7 x weekly - 3 sets - 10 reps - Seated Cervical Retraction  - 1 x daily - 7 x weekly - 3 sets - 10 reps - Supine Shoulder Flexion Extension AAROM with Dowel  - 2 x daily - 7 x weekly - 1 sets - 10 reps - 5 seconds  hold - Supine Shoulder Abduction AAROM with Dowel  - 2 x daily - 7 x weekly - 1 sets - 10 reps - 5 seconds  hold - Seated Shoulder External Rotation AAROM with Dowel  - 2 x daily - 7 x weekly - 1 sets - 10 reps - 5 seconds   hold    ASSESSMENT:  CLINICAL IMPRESSION:     Pt arrives today doing well, she is still having some pain with active movements, especially ABD but it is better. We continued working on strengthening as able today, she is on board with MD to DC on the 15th. From PT perspective, it would be preferable to continue a little longer but we will make sure we update her HEP to appropriate intensity prior to DC.    OBJECTIVE IMPAIRMENTS: decreased activity tolerance, decreased coordination, decreased mobility, decreased ROM, decreased strength, increased muscle spasms, impaired flexibility, impaired UE functional use, postural dysfunction, and pain.   ACTIVITY LIMITATIONS: carrying, lifting, bed mobility, bathing, toileting, dressing, reach over head, and hygiene/grooming  PARTICIPATION LIMITATIONS: meal prep, cleaning, laundry, driving, shopping, and community activity  PERSONAL FACTORS: Past/current experiences are also affecting patient's functional outcome.   REHAB POTENTIAL: Good  CLINICAL DECISION MAKING: Evolving/moderate complexity  EVALUATION COMPLEXITY: Low   GOALS: Goals reviewed with patient? Yes  SHORT TERM GOALS: Target date: 11/05/23  I with initial HEP Baseline: Goal status: 10/21/23-met  LONG TERM GOALS: Target date: 02/26/2024    I with final HEP Baseline:  Goal status: ONGOING 01/01/24   01/15/24 evolving  2.  L shoulder AROM to be within functional range without compensation patterns to allow her to perform all functional tasks at home  Baseline:  Goal status: ONGOING/REVISED 01/01/24 but approaching functional for flexion . 01/15/24 PRogressing  3.  Increase L shoulder strength to 5/5 in all planes. Baseline:  Goal status:   ONGOING 01/01/24    01/15/24 progressing  4.  Patient will be able to use LUE as normal when working in her garden including pulling weeds, hoeing, etc. Baseline:  Goal status: ONGOING 01/01/24  Progressing 01/15/24  5.  Patient will report pain  < 3/10 with all of her daily activities. Baseline:  Goal status: MET 01/01/24   PLAN:  PT FREQUENCY: 1-2x/week  PT DURATION: 8 weeks  PLANNED INTERVENTIONS: 97110-Therapeutic exercises, 97530- Therapeutic activity, 97112- Neuromuscular re-education, 97535- Self Care, 16109- Manual therapy, 97016- Vasopneumatic device, 97033- Ionotophoresis 4mg /ml Dexamethasone, Patient/Family education, Taping, Joint mobilization, Cryotherapy, and Moist heat  PLAN FOR NEXT SESSION: progress strength and function as tolerated. Planning on DC May 15th, need to make sure we are updating HEP appropriately    Terrel Ferries, PT, DPT 02/03/24 9:26 AM  [facility] [address] [facility] [place], [place], [address] Phone: [phone]   Fax:  [phone]

## 2024-02-04 DIAGNOSIS — N1832 Chronic kidney disease, stage 3b: Secondary | ICD-10-CM | POA: Diagnosis not present

## 2024-02-04 DIAGNOSIS — I129 Hypertensive chronic kidney disease with stage 1 through stage 4 chronic kidney disease, or unspecified chronic kidney disease: Secondary | ICD-10-CM | POA: Diagnosis not present

## 2024-02-04 DIAGNOSIS — Z944 Liver transplant status: Secondary | ICD-10-CM | POA: Diagnosis not present

## 2024-02-04 DIAGNOSIS — R059 Cough, unspecified: Secondary | ICD-10-CM | POA: Diagnosis not present

## 2024-02-05 ENCOUNTER — Encounter: Payer: Self-pay | Admitting: Physical Therapy

## 2024-02-05 ENCOUNTER — Ambulatory Visit: Admitting: Physical Therapy

## 2024-02-05 DIAGNOSIS — Z9889 Other specified postprocedural states: Secondary | ICD-10-CM

## 2024-02-05 DIAGNOSIS — M25612 Stiffness of left shoulder, not elsewhere classified: Secondary | ICD-10-CM | POA: Diagnosis not present

## 2024-02-05 DIAGNOSIS — M6281 Muscle weakness (generalized): Secondary | ICD-10-CM | POA: Diagnosis not present

## 2024-02-05 NOTE — Therapy (Signed)
 OUTPATIENT PHYSICAL THERAPY SHOULDER TREATMENT   Patient Name: Alicia [name] MRN: [id number] DOB:October 24, 1960, 63 y.o., female Today's Date: 02/05/2024         END OF SESSION:  PT End of Session - 02/05/24 0900     Visit Number 23    Date for PT Re-Evaluation 02/26/24    Authorization Type BCBS MCR    Progress Note Due on Visit 26    PT Start Time 0848    PT Stop Time 0927    PT Time Calculation (min) 39 min    Activity Tolerance Patient tolerated treatment well    Behavior During Therapy WFL for tasks assessed/performed                       Past Medical History:  Diagnosis Date   Allergy    Anemia    Asthma    Cellulitis    Cirrhosis (HCC)    GERD (gastroesophageal reflux disease)    Hepatic fibrosis    congenital   History of UTI    chronic as a child   Sarcoidosis    Spleen enlarged    Past Surgical History:  Procedure Laterality Date   COLONOSCOPY     CYST EXCISION Left 05/01/2017   Procedure: LEFT INDEX FINGER CYST EXCISION;  Surgeon: Wes Hamman, MD;  Location: [facility] OR;  Service: Orthopedics;  Laterality: Left;   ESOPHAGOGASTRODUODENOSCOPY     LIVER TRANSPLANT     SPINAL FUSION  12/2007   Double spinal Fusion   VIDEO BRONCHOSCOPY Bilateral 12/08/2015   Procedure: VIDEO BRONCHOSCOPY WITH FLUORO;  Surgeon: Lind Repine, MD;  Location: [facility] ENDOSCOPY;  Service: Cardiopulmonary;  Laterality: Bilateral;   Patient Active Problem List   Diagnosis Date Noted   Osteoporosis without current pathological fracture 05/25/2018   Status post liver transplant (HCC) 12/25/2017   Syncope 07/17/2017   Other cirrhosis of liver (HCC) 05/10/2017   Immunocompromised patient (HCC) 05/10/2017   Mucous cyst of digit of left hand 04/11/2017   Cellulitis, abdominal wall 03/01/2017   Normocytic anemia 03/01/2017   Hypokalemia 03/01/2017   CKD (chronic kidney disease), stage III (HCC) 03/01/2017   Abdominal wall cellulitis 03/01/2017   Adrenal insufficiency (HCC)  03/01/2017   Hyperammonemia (HCC)    Cellulitis 06/28/2016   Thrombocytopenia (HCC) 06/28/2016   AKI (acute kidney injury) (HCC) 06/28/2016   Multiple pulmonary nodules determined by computed tomography of lung    Chronic cough 09/07/2015   Hypertension, portal (HCC) 09/04/2011   Asthma with chronic obstructive pulmonary disease (COPD) (HCC) 09/04/2011   Vocal cord nodules 07/04/2011   GERD (gastroesophageal reflux disease) 07/04/2011   Sarcoidosis 07/01/2011   Asthma, severe persistent, with upper airway instability 06/13/2011   Congenital hepatic fibrosis 07/03/2000    PCP: Copland, Skipper Dumas, MD  REFERRING PROVIDER: Ellard Gunning, MD   REFERRING DIAG: M75.42 Lt Shoulder Impingement   THERAPY DIAG:  S/P left rotator cuff repair  Muscle weakness (generalized)  Stiffness of left shoulder, not elsewhere classified  Rationale for Evaluation and Treatment: Rehabilitation  ONSET DATE: 10/07/23  SUBJECTIVE:  SUBJECTIVE STATEMENT:  Doing well, sore from last time but not like an irritated sore- like a workout. The IR stretches are good but feel sore from those as well     PERTINENT HISTORY: Per referral:PT 1 x/week x 4 weeks for gentle PROM only, S/P L SA, Deb, SAD, RCR, DOS 10/07/23 Per referring physician note: Marvell Slider in 2023, injured L shoulder. The pain never improved, has gotten worse. PMHx: Hepaptic Fibrosis, liver transplant 54yrs ago Chronic kidney disease  Sarcoidosis Double spinal fusion C4-5 Osteoporosis  Fall on 12/05/21  PAIN:  Are you having pain? No 0/10- just still sore   PRECAUTIONS: Shoulder, Fall, and Other: Liver transplant, CKD  RED FLAGS: None   WEIGHT BEARING RESTRICTIONS: Yes WBAT   FALLS:  Has patient fallen in last 6 months? No  LIVING ENVIRONMENT: Lives with:  lives with their family Lives in: House/apartment Stairs: Yes: Internal: 14 steps; on right going up Has following equipment at home: Walker - 2 wheeled  OCCUPATION: N/A since her liver transplant, She loves to garden and does chair yoga, walking, hiking, camping.  PLOF: Independent  PATIENT GOALS:Patient would like to return to her normal daily activities, including gardening.  NEXT MD VISIT: late April 2025  OBJECTIVE:  Note: Objective measures were completed at Evaluation unless otherwise noted.  DIAGNOSTIC FINDINGS:  Not obtained.  COGNITION: Overall cognitive status: Within functional limits for tasks assessed     SENSATION: Not tested  POSTURE: Upright posture, L scapula sits in slight elevation and add.  UPPER EXTREMITY ROM: L elbow, wrist and hand all WNL  Passive ROM Right eval Left eval Left 11/11/23 Left 11/18/23 PROM Left 12/18/23 AROM supine  Left 01/01/24 AROM supine   Shoulder flexion  58 90* 90-100*  126* 131*  Shoulder extension  20      Shoulder abduction  46 60* (approximate) 80* (approximate), pain limited  94* pain limited  154*  Shoulder adduction        Shoulder internal rotation  At least 60   Hand to belly at 0* ABD    Shoulder external rotation  neutral At 0* ABD, about 10* ER  At 0* ABD, about 15* ER  At 0* ABD about 35* (approximate) At 10* ABD, about 37* (approximate)  Elbow flexion        Elbow extension        Wrist flexion        Wrist extension        Wrist ulnar deviation        Wrist radial deviation        Wrist pronation        Wrist supination        (Blank rows = not tested)  UPPER EXTREMITY MMT: L shoulder deferred, L elbow, wrist and hand at least 3/5.    12/18/23 01/01/24  L shoulder flexion  4 4+  L shoulder ABD  3+ upper trap compensations with gravity resisted  4 less upper trap compensation   L shoulder extension  4 4+  L shoulder ER  3+ at best  3+  L shoulder IR  4+  5     SHOULDER SPECIAL  TESTS: deferred  JOINT MOBILITY TESTING:  deferred  PALPATION:  Patient has increased tension in B UT, TTP and guarded all around L shoulder.  TREATMENT DATE:   02/05/24  UBE L3x2 min forward/2 min backward (limited due to asthma, Nustep not available) Wall ladder flexion 10x5 seconds  Wall ladder scaption 10x5 seconds Wall ball alphabet x3 Wall pushups x15  Isometric shoulder flexion holds at 90* 2x60 seconds  Isometric shoulder scaption holds at 90* 2x60 seconds  Shoulder flexion back to wall 2.5# x10  Shoulder scaption R shoulder to wall (to 90*) 2.5# x10     02/03/24  UBE L3x2 min forward, 2 min backward (limited due to asthma, Nustep not available) Wall ladder scaption and flexion 7x5 seconds B Towel IR stretch 10x5 seconds L (added to HEP) Sidelying IR stretch 10x5 seconds L (added to HEP) Standing shoulder flexion against wall 2# x15 Machine rows 10# x20 Standing shoulder ABD 2# x12  Shoulder extension 10# x12 Standing Ys at wall 2# x12  Standing Ts at wall 2# x12  Standing Is at wall 2# x12  Wall ball alphabet x2 rounds      PATIENT EDUCATION: Education details: POC Person educated: Patient Education method: Explanation Education comprehension: verbalized understanding  HOME EXERCISE PROGRAM:   Access Code: [email or web address] URL: [email or web address] Date: 02/05/2024 Prepared by: Terrel Ferries  Exercises - Shoulder extension with resistance - Neutral  - 1 x daily - 7 x weekly - 2 sets - 10 reps - Standing Shoulder Row with Anchored Resistance  - 1 x daily - 7 x weekly - 2 sets - 10 reps - Shoulder External Rotation with Anchored Resistance  - 1 x daily - 7 x weekly - 2 sets - 10 reps - Shoulder Internal Rotation with Resistance  - 1 x daily - 7 x weekly - 2 sets - 10 reps - Standing Bicep Curls with Resistance  - 1 x daily  - 7 x weekly - 2 sets - 10 reps - Standing Shoulder Abduction AAROM with Dowel  - 2 x daily - 7 x weekly - 1 sets - 10-15 reps - Standing Shoulder Internal Rotation Stretch with Towel  - 2 x daily - 7 x weekly - 1 sets - 10 reps - 5 seconds  hold - Sleeper Stretch  - 2 x daily - 7 x weekly - 1 sets - 10 reps - 5 seconds hold - Single Arm Low Trap Setting at Wall  - 1 x daily - 7 x weekly - 1 sets - 10 reps - Single Arm Shoulder Horizontal Abduction - Thumb Up  - 1 x daily - 7 x weekly - 1 sets - 10 reps - Standing Shoulder Extension  - 1 x daily - 7 x weekly - 1 sets - 10 reps       Access Code: [email or web address] URL: [email or web address] Date: 11/28/2023 Prepared by: Towanda Fret  Exercises - Seated Scapular Retraction  - 1 x daily - 7 x weekly - 3 sets - 10 reps - Seated Cervical Retraction  - 1 x daily - 7 x weekly - 3 sets - 10 reps - Supine Shoulder Flexion Extension AAROM with Dowel  - 2 x daily - 7 x weekly - 1 sets - 10 reps - 5 seconds  hold - Supine Shoulder Abduction AAROM with Dowel  - 2 x daily - 7 x weekly - 1 sets - 10 reps - 5 seconds  hold - Seated Shoulder External Rotation AAROM with Dowel  - 2 x daily - 7 x weekly - 1 sets - 10 reps - 5 seconds  hold    ASSESSMENT:  CLINICAL IMPRESSION:  Pt arrives today doing OK, sore from prior exercises and workouts   OBJECTIVE IMPAIRMENTS: decreased activity tolerance, decreased coordination, decreased mobility, decreased ROM, decreased strength, increased muscle spasms, impaired flexibility, impaired UE functional use, postural dysfunction, and pain.   ACTIVITY LIMITATIONS: carrying, lifting, bed mobility, bathing, toileting, dressing, reach over head, and hygiene/grooming  PARTICIPATION LIMITATIONS: meal prep, cleaning, laundry, driving, shopping, and community activity  PERSONAL FACTORS: Past/current experiences are also affecting patient's functional outcome.   REHAB POTENTIAL: Good  CLINICAL  DECISION MAKING: Evolving/moderate complexity  EVALUATION COMPLEXITY: Low   GOALS: Goals reviewed with patient? Yes  SHORT TERM GOALS: Target date: 11/05/23  I with initial HEP Baseline: Goal status: 10/21/23-met  LONG TERM GOALS: Target date: 02/26/2024    I with final HEP Baseline:  Goal status: ONGOING 01/01/24   01/15/24 evolving  2.  L shoulder AROM to be within functional range without compensation patterns to allow her to perform all functional tasks at home  Baseline:  Goal status: ONGOING/REVISED 01/01/24 but approaching functional for flexion . 01/15/24 PRogressing  3.  Increase L shoulder strength to 5/5 in all planes. Baseline:  Goal status:   ONGOING 01/01/24    01/15/24 progressing  4.  Patient will be able to use LUE as normal when working in her garden including pulling weeds, hoeing, etc. Baseline:  Goal status: ONGOING 01/01/24  Progressing 01/15/24  5.  Patient will report pain < 3/10 with all of her daily activities. Baseline:  Goal status: MET 01/01/24   PLAN:  PT FREQUENCY: 1-2x/week  PT DURATION: 8 weeks  PLANNED INTERVENTIONS: 97110-Therapeutic exercises, 97530- Therapeutic activity, 97112- Neuromuscular re-education, 97535- Self Care, 16109- Manual therapy, 97016- Vasopneumatic device, 97033- Ionotophoresis 4mg /ml Dexamethasone, Patient/Family education, Taping, Joint mobilization, Cryotherapy, and Moist heat  PLAN FOR NEXT SESSION: progress strength and function as tolerated. Planning on DC May 15th, need to make sure we are updating HEP appropriately    Terrel Ferries, PT, DPT 02/05/24 9:27 AM  [facility] [address] [facility] [place], [place], [address] Phone: [phone]   Fax:  [phone]

## 2024-02-10 ENCOUNTER — Ambulatory Visit: Admitting: Physical Therapy

## 2024-02-10 DIAGNOSIS — M6281 Muscle weakness (generalized): Secondary | ICD-10-CM

## 2024-02-10 DIAGNOSIS — Z9889 Other specified postprocedural states: Secondary | ICD-10-CM

## 2024-02-10 DIAGNOSIS — M25612 Stiffness of left shoulder, not elsewhere classified: Secondary | ICD-10-CM

## 2024-02-10 NOTE — Therapy (Signed)
 OUTPATIENT PHYSICAL THERAPY SHOULDER TREATMENT   Patient Name: Alicia [name] MRN: [id number] DOB:08/20/1961, 63 y.o., female Today's Date: 02/10/2024         END OF SESSION:  PT End of Session - 02/10/24 0844     Visit Number 24    Date for PT Re-Evaluation 02/26/24    Authorization Type BCBS MCR    PT Start Time 0845    PT Stop Time 0930    PT Time Calculation (min) 45 min                       Past Medical History:  Diagnosis Date   Allergy    Anemia    Asthma    Cellulitis    Cirrhosis (HCC)    GERD (gastroesophageal reflux disease)    Hepatic fibrosis    congenital   History of UTI    chronic as a child   Sarcoidosis    Spleen enlarged    Past Surgical History:  Procedure Laterality Date   COLONOSCOPY     CYST EXCISION Left 05/01/2017   Procedure: LEFT INDEX FINGER CYST EXCISION;  Surgeon: Wes Hamman, MD;  Location: [facility] OR;  Service: Orthopedics;  Laterality: Left;   ESOPHAGOGASTRODUODENOSCOPY     LIVER TRANSPLANT     SPINAL FUSION  12/2007   Double spinal Fusion   VIDEO BRONCHOSCOPY Bilateral 12/08/2015   Procedure: VIDEO BRONCHOSCOPY WITH FLUORO;  Surgeon: Lind Repine, MD;  Location: [facility] ENDOSCOPY;  Service: Cardiopulmonary;  Laterality: Bilateral;   Patient Active Problem List   Diagnosis Date Noted   Osteoporosis without current pathological fracture 05/25/2018   Status post liver transplant (HCC) 12/25/2017   Syncope 07/17/2017   Other cirrhosis of liver (HCC) 05/10/2017   Immunocompromised patient (HCC) 05/10/2017   Mucous cyst of digit of left hand 04/11/2017   Cellulitis, abdominal wall 03/01/2017   Normocytic anemia 03/01/2017   Hypokalemia 03/01/2017   CKD (chronic kidney disease), stage III (HCC) 03/01/2017   Abdominal wall cellulitis 03/01/2017   Adrenal insufficiency (HCC) 03/01/2017   Hyperammonemia (HCC)    Cellulitis 06/28/2016   Thrombocytopenia (HCC) 06/28/2016   AKI (acute kidney injury) (HCC) 06/28/2016    Multiple pulmonary nodules determined by computed tomography of lung    Chronic cough 09/07/2015   Hypertension, portal (HCC) 09/04/2011   Asthma with chronic obstructive pulmonary disease (COPD) (HCC) 09/04/2011   Vocal cord nodules 07/04/2011   GERD (gastroesophageal reflux disease) 07/04/2011   Sarcoidosis 07/01/2011   Asthma, severe persistent, with upper airway instability 06/13/2011   Congenital hepatic fibrosis 07/03/2000    PCP: Copland, Skipper Dumas, MD  REFERRING PROVIDER: Ellard Gunning, MD   REFERRING DIAG: M75.42 Lt Shoulder Impingement   THERAPY DIAG:  S/P left rotator cuff repair  Muscle weakness (generalized)  Stiffness of left shoulder, not elsewhere classified  Rationale for Evaluation and Treatment: Rehabilitation  ONSET DATE: 10/07/23  SUBJECTIVE:  SUBJECTIVE STATEMENT:  Doing pretty good. doing a lot of ex at home, behind the back is still the worst.   PERTINENT HISTORY: Per referral:PT 1 x/week x 4 weeks for gentle PROM only, S/P L SA, Deb, SAD, RCR, DOS 10/07/23 Per referring physician note: Marvell Slider in 2023, injured L shoulder. The pain never improved, has gotten worse. PMHx: Hepaptic Fibrosis, liver transplant 70yrs ago Chronic kidney disease  Sarcoidosis Double spinal fusion C4-5 Osteoporosis  Fall on 12/05/21  PAIN:  Are you having pain? No 0/10- just still sore   PRECAUTIONS: Shoulder, Fall, and Other: Liver transplant, CKD  RED FLAGS: None   WEIGHT BEARING RESTRICTIONS: Yes WBAT   FALLS:  Has patient fallen in last 6 months? No  LIVING ENVIRONMENT: Lives with: lives with their family Lives in: House/apartment Stairs: Yes: Internal: 14 steps; on right going up Has following equipment at home: Walker - 2 wheeled  OCCUPATION: N/A since her liver transplant, She  loves to garden and does chair yoga, walking, hiking, camping.  PLOF: Independent  PATIENT GOALS:Patient would like to return to her normal daily activities, including gardening.  NEXT MD VISIT: late April 2025  OBJECTIVE:  Note: Objective measures were completed at Evaluation unless otherwise noted.  DIAGNOSTIC FINDINGS:  Not obtained.  COGNITION: Overall cognitive status: Within functional limits for tasks assessed     SENSATION: Not tested  POSTURE: Upright posture, L scapula sits in slight elevation and add.  UPPER EXTREMITY ROM: L elbow, wrist and hand all WNL  Passive ROM Right eval Left eval Left 11/11/23 Left 11/18/23 PROM Left 12/18/23 AROM supine  Left 01/01/24 AROM supine   Shoulder flexion  58 90* 90-100*  126* 131*  Shoulder extension  20      Shoulder abduction  46 60* (approximate) 80* (approximate), pain limited  94* pain limited  154*  Shoulder adduction        Shoulder internal rotation  At least 60   Hand to belly at 0* ABD    Shoulder external rotation  neutral At 0* ABD, about 10* ER  At 0* ABD, about 15* ER  At 0* ABD about 35* (approximate) At 10* ABD, about 37* (approximate)  Elbow flexion        Elbow extension        Wrist flexion        Wrist extension        Wrist ulnar deviation        Wrist radial deviation        Wrist pronation        Wrist supination        (Blank rows = not tested)  UPPER EXTREMITY MMT: L shoulder deferred, L elbow, wrist and hand at least 3/5.    12/18/23 01/01/24  L shoulder flexion  4 4+  L shoulder ABD  3+ upper trap compensations with gravity resisted  4 less upper trap compensation   L shoulder extension  4 4+  L shoulder ER  3+ at best  3+  L shoulder IR  4+  5     SHOULDER SPECIAL TESTS: deferred  JOINT MOBILITY TESTING:  deferred  PALPATION:  Patient has increased tension in B UT, TTP and guarded all around L shoulder.  TREATMENT DATE:   02/10/24 Nustep standing push /pull L 4 2 min then laterally 2 min 5# rotation around back 10 x each way Standing BIL flexion 2# 10x 2 sets  Standing Left shld abd 2# 10x 2 sets  Standing Left  shld ER 2 sets 10 2# 5# cable pulleys IR and ER 2 sets 5 PROM and stretching AROM standing flex  160  abd  158   IR   65   ER 72 MMT standing  WFLS except ER 4+/5     02/05/24  UBE L3x2 min forward/2 min backward (limited due to asthma, Nustep not available) Wall ladder flexion 10x5 seconds  Wall ladder scaption 10x5 seconds Wall ball alphabet x3 Wall pushups x15  Isometric shoulder flexion holds at 90* 2x60 seconds  Isometric shoulder scaption holds at 90* 2x60 seconds  Shoulder flexion back to wall 2.5# x10  Shoulder scaption R shoulder to wall (to 90*) 2.5# x10     02/03/24  UBE L3x2 min forward, 2 min backward (limited due to asthma, Nustep not available) Wall ladder scaption and flexion 7x5 seconds B Towel IR stretch 10x5 seconds L (added to HEP) Sidelying IR stretch 10x5 seconds L (added to HEP) Standing shoulder flexion against wall 2# x15 Machine rows 10# x20 Standing shoulder ABD 2# x12  Shoulder extension 10# x12 Standing Ys at wall 2# x12  Standing Ts at wall 2# x12  Standing Is at wall 2# x12  Wall ball alphabet x2 rounds      PATIENT EDUCATION: Education details: POC Person educated: Patient Education method: Explanation Education comprehension: verbalized understanding  HOME EXERCISE PROGRAM:   Access Code: [email or web address] URL: [email or web address] Date: 02/05/2024 Prepared by: Terrel Ferries  Exercises - Shoulder extension with resistance - Neutral  - 1 x daily - 7 x weekly - 2 sets - 10 reps - Standing Shoulder Row with Anchored Resistance  - 1 x daily - 7 x weekly - 2 sets - 10 reps - Shoulder External Rotation with Anchored Resistance  - 1 x daily - 7 x weekly - 2 sets - 10  reps - Shoulder Internal Rotation with Resistance  - 1 x daily - 7 x weekly - 2 sets - 10 reps - Standing Bicep Curls with Resistance  - 1 x daily - 7 x weekly - 2 sets - 10 reps - Standing Shoulder Abduction AAROM with Dowel  - 2 x daily - 7 x weekly - 1 sets - 10-15 reps - Standing Shoulder Internal Rotation Stretch with Towel  - 2 x daily - 7 x weekly - 1 sets - 10 reps - 5 seconds  hold - Sleeper Stretch  - 2 x daily - 7 x weekly - 1 sets - 10 reps - 5 seconds hold - Single Arm Low Trap Setting at Wall  - 1 x daily - 7 x weekly - 1 sets - 10 reps - Single Arm Shoulder Horizontal Abduction - Thumb Up  - 1 x daily - 7 x weekly - 1 sets - 10 reps - Standing Shoulder Extension  - 1 x daily - 7 x weekly - 1 sets - 10 reps       Access Code: [email or web address] URL: [email or web address] Date: 11/28/2023 Prepared by: Towanda Fret  Exercises - Seated Scapular Retraction  - 1 x daily - 7 x weekly - 3 sets - 10 reps - Seated Cervical Retraction  - 1 x daily - 7 x weekly - 3 sets - 10 reps -  Supine Shoulder Flexion Extension AAROM with Dowel  - 2 x daily - 7 x weekly - 1 sets - 10 reps - 5 seconds  hold - Supine Shoulder Abduction AAROM with Dowel  - 2 x daily - 7 x weekly - 1 sets - 10 reps - 5 seconds  hold - Seated Shoulder External Rotation AAROM with Dowel  - 2 x daily - 7 x weekly - 1 sets - 10 reps - 5 seconds  hold    ASSESSMENT:  CLINICAL IMPRESSION:     Progressed ROM and strengthening ex. Reviewed and documented goals.  OBJECTIVE IMPAIRMENTS: decreased activity tolerance, decreased coordination, decreased mobility, decreased ROM, decreased strength, increased muscle spasms, impaired flexibility, impaired UE functional use, postural dysfunction, and pain.   ACTIVITY LIMITATIONS: carrying, lifting, bed mobility, bathing, toileting, dressing, reach over head, and hygiene/grooming  PARTICIPATION LIMITATIONS: meal prep, cleaning, laundry, driving, shopping, and  community activity  PERSONAL FACTORS: Past/current experiences are also affecting patient's functional outcome.   REHAB POTENTIAL: Good  CLINICAL DECISION MAKING: Evolving/moderate complexity  EVALUATION COMPLEXITY: Low   GOALS: Goals reviewed with patient? Yes  SHORT TERM GOALS: Target date: 11/05/23  I with initial HEP Baseline: Goal status: 10/21/23-met  LONG TERM GOALS: Target date: 02/26/2024    I with final HEP Baseline:  Goal status: ONGOING 01/01/24   01/15/24 evolving  02/10/24 MET  2.  L shoulder AROM to be within functional range without compensation patterns to allow her to perform all functional tasks at home  Baseline:  Goal status: ONGOING/REVISED 01/01/24 but approaching functional for flexion . 01/15/24 PRogressing   02/10/24 progressing  3.  Increase L shoulder strength to 5/5 in all planes. Baseline:  Goal status:   ONGOING 01/01/24    01/15/24 progressing  02/10/24 MET WFLs except ER 4+/5  4.  Patient will be able to use LUE as normal when working in her garden including pulling weeds, hoeing, etc. Baseline:  Goal status: ONGOING 01/01/24  Progressing 01/15/24  MET 02/10/24  5.  Patient will report pain < 3/10 with all of her daily activities. Baseline:  Goal status: MET 01/01/24   PLAN:  PT FREQUENCY: 1-2x/week  PT DURATION: 8 weeks  PLANNED INTERVENTIONS: 97110-Therapeutic exercises, 97530- Therapeutic activity, 97112- Neuromuscular re-education, 97535- Self Care, 54098- Manual therapy, 97016- Vasopneumatic device, [phone]- Ionotophoresis 4mg /ml Dexamethasone, Patient/Family education, Taping, Joint mobilization, Cryotherapy, and Moist heat  PLAN FOR NEXT SESSION: D/C next session Patient Details  Name: Evelina Mcmackin MRN: [id number] Date of Birth: 1961-08-15 Referring Provider:  Kaylee Partridge, MD  Encounter Date: 02/10/2024   Aquilla Bayley, PTA 02/10/2024, 8:45 AM  [place]  [facility] [address] [place], [place], [address] Phone: [phone]   Fax:  [phone]

## 2024-02-12 ENCOUNTER — Ambulatory Visit: Admitting: Physical Therapy

## 2024-02-12 DIAGNOSIS — M6281 Muscle weakness (generalized): Secondary | ICD-10-CM

## 2024-02-12 DIAGNOSIS — Z9889 Other specified postprocedural states: Secondary | ICD-10-CM | POA: Diagnosis not present

## 2024-02-12 DIAGNOSIS — M25612 Stiffness of left shoulder, not elsewhere classified: Secondary | ICD-10-CM | POA: Diagnosis not present

## 2024-02-12 NOTE — Therapy (Signed)
 OUTPATIENT PHYSICAL THERAPY SHOULDER TREATMENT   Patient Name: Alicia [name] MRN: [id number] DOB:1960/12/27, 63 y.o., female Today's Date: 02/12/2024         END OF SESSION:  PT End of Session - 02/12/24 0844     Visit Number 25    Date for PT Re-Evaluation 02/26/24    Authorization Type BCBS MCR    PT Start Time 0845    PT Stop Time 0925    PT Time Calculation (min) 40 min                       Past Medical History:  Diagnosis Date   Allergy    Anemia    Asthma    Cellulitis    Cirrhosis (HCC)    GERD (gastroesophageal reflux disease)    Hepatic fibrosis    congenital   History of UTI    chronic as a child   Sarcoidosis    Spleen enlarged    Past Surgical History:  Procedure Laterality Date   COLONOSCOPY     CYST EXCISION Left 05/01/2017   Procedure: LEFT INDEX FINGER CYST EXCISION;  Surgeon: Wes Hamman, MD;  Location: [facility] OR;  Service: Orthopedics;  Laterality: Left;   ESOPHAGOGASTRODUODENOSCOPY     LIVER TRANSPLANT     SPINAL FUSION  12/2007   Double spinal Fusion   VIDEO BRONCHOSCOPY Bilateral 12/08/2015   Procedure: VIDEO BRONCHOSCOPY WITH FLUORO;  Surgeon: Lind Repine, MD;  Location: [facility] ENDOSCOPY;  Service: Cardiopulmonary;  Laterality: Bilateral;   Patient Active Problem List   Diagnosis Date Noted   Osteoporosis without current pathological fracture 05/25/2018   Status post liver transplant (HCC) 12/25/2017   Syncope 07/17/2017   Other cirrhosis of liver (HCC) 05/10/2017   Immunocompromised patient (HCC) 05/10/2017   Mucous cyst of digit of left hand 04/11/2017   Cellulitis, abdominal wall 03/01/2017   Normocytic anemia 03/01/2017   Hypokalemia 03/01/2017   CKD (chronic kidney disease), stage III (HCC) 03/01/2017   Abdominal wall cellulitis 03/01/2017   Adrenal insufficiency (HCC) 03/01/2017   Hyperammonemia (HCC)    Cellulitis 06/28/2016   Thrombocytopenia (HCC) 06/28/2016   AKI (acute kidney injury) (HCC) 06/28/2016    Multiple pulmonary nodules determined by computed tomography of lung    Chronic cough 09/07/2015   Hypertension, portal (HCC) 09/04/2011   Asthma with chronic obstructive pulmonary disease (COPD) (HCC) 09/04/2011   Vocal cord nodules 07/04/2011   GERD (gastroesophageal reflux disease) 07/04/2011   Sarcoidosis 07/01/2011   Asthma, severe persistent, with upper airway instability 06/13/2011   Congenital hepatic fibrosis 07/03/2000    PCP: Copland, Skipper Dumas, MD  REFERRING PROVIDER: Ellard Gunning, MD   REFERRING DIAG: M75.42 Lt Shoulder Impingement   THERAPY DIAG:  S/P left rotator cuff repair  Muscle weakness (generalized)  Stiffness of left shoulder, not elsewhere classified  Rationale for Evaluation and Treatment: Rehabilitation  ONSET DATE: 10/07/23  SUBJECTIVE:  SUBJECTIVE STATEMENT:  Doing good,raised arm way up this morning   PERTINENT HISTORY: Per referral:PT 1 x/week x 4 weeks for gentle PROM only, S/P L SA, Deb, SAD, RCR, DOS 10/07/23 Per referring physician note: Alicia [name] in 2023, injured L shoulder. The pain never improved, has gotten worse. PMHx: Hepaptic Fibrosis, liver transplant 16yrs ago Chronic kidney disease  Sarcoidosis Double spinal fusion C4-5 Osteoporosis  Fall on 12/05/21  PAIN:  Are you having pain? No 0/10- just still sore   PRECAUTIONS: Shoulder, Fall, and Other: Liver transplant, CKD  RED FLAGS: None   WEIGHT BEARING RESTRICTIONS: Yes WBAT   FALLS:  Has patient fallen in last 6 months? No  LIVING ENVIRONMENT: Lives with: lives with their family Lives in: House/apartment Stairs: Yes: Internal: 14 steps; on right going up Has following equipment at home: Walker - 2 wheeled  OCCUPATION: N/A since her liver transplant, She loves to garden and does chair yoga,  walking, hiking, camping.  PLOF: Independent  PATIENT GOALS:Patient would like to return to her normal daily activities, including gardening.  NEXT MD VISIT: late April 2025  OBJECTIVE:  Note: Objective measures were completed at Evaluation unless otherwise noted.  DIAGNOSTIC FINDINGS:  Not obtained.  COGNITION: Overall cognitive status: Within functional limits for tasks assessed     SENSATION: Not tested  POSTURE: Upright posture, L scapula sits in slight elevation and add.  UPPER EXTREMITY ROM: L elbow, wrist and hand all WNL  Passive ROM Right eval Left eval Left 11/11/23 Left 11/18/23 PROM Left 12/18/23 AROM supine  Left 01/01/24 AROM supine   Shoulder flexion  58 90* 90-100*  126* 131*  Shoulder extension  20      Shoulder abduction  46 60* (approximate) 80* (approximate), pain limited  94* pain limited  154*  Shoulder adduction        Shoulder internal rotation  At least 60   Hand to belly at 0* ABD    Shoulder external rotation  neutral At 0* ABD, about 10* ER  At 0* ABD, about 15* ER  At 0* ABD about 35* (approximate) At 10* ABD, about 37* (approximate)  Elbow flexion        Elbow extension        Wrist flexion        Wrist extension        Wrist ulnar deviation        Wrist radial deviation        Wrist pronation        Wrist supination        (Blank rows = not tested)  UPPER EXTREMITY MMT: L shoulder deferred, L elbow, wrist and hand at least 3/5.    12/18/23 01/01/24  L shoulder flexion  4 4+  L shoulder ABD  3+ upper trap compensations with gravity resisted  4 less upper trap compensation   L shoulder extension  4 4+  L shoulder ER  3+ at best  3+  L shoulder IR  4+  5     SHOULDER SPECIAL TESTS: deferred  JOINT MOBILITY TESTING:  deferred  PALPATION:  Patient has increased tension in B UT, TTP and guarded all around L shoulder.  TREATMENT DATE:   02/12/24 Nustep standing push /pull L 5 3 min then laterally 3 min Standing BIL flexion 2# 10x 2 sets  Standing Left shld abd 2# 10x 2 sets  Standing Left  shld ER 2 sets 10 2# 5# cable pulleys IR and ER 2 sets 5 PROM and stretching Reviewed HEP   02/10/24 Nustep standing push /pull L 4 2 min then laterally 2 min 5# rotation around back 10 x each way Standing BIL flexion 2# 10x 2 sets  Standing Left shld abd 2# 10x 2 sets  Standing Left  shld ER 2 sets 10 2# 5# cable pulleys IR and ER 2 sets 5 PROM and stretching AROM standing flex  160  abd  158   IR   65   ER 72 MMT standing  WFLS except ER 4+/5     02/05/24  UBE L3x2 min forward/2 min backward (limited due to asthma, Nustep not available) Wall ladder flexion 10x5 seconds  Wall ladder scaption 10x5 seconds Wall ball alphabet x3 Wall pushups x15  Isometric shoulder flexion holds at 90* 2x60 seconds  Isometric shoulder scaption holds at 90* 2x60 seconds  Shoulder flexion back to wall 2.5# x10  Shoulder scaption R shoulder to wall (to 90*) 2.5# x10     02/03/24  UBE L3x2 min forward, 2 min backward (limited due to asthma, Nustep not available) Wall ladder scaption and flexion 7x5 seconds B Towel IR stretch 10x5 seconds L (added to HEP) Sidelying IR stretch 10x5 seconds L (added to HEP) Standing shoulder flexion against wall 2# x15 Machine rows 10# x20 Standing shoulder ABD 2# x12  Shoulder extension 10# x12 Standing Ys at wall 2# x12  Standing Ts at wall 2# x12  Standing Is at wall 2# x12  Wall ball alphabet x2 rounds      PATIENT EDUCATION: Education details: POC Person educated: Patient Education method: Explanation Education comprehension: verbalized understanding  HOME EXERCISE PROGRAM:   Access Code: [email or web address] URL: [email or web address] Date: 02/05/2024 Prepared by: Terrel Ferries  Exercises - Shoulder extension with resistance - Neutral  - 1 x daily - 7 x  weekly - 2 sets - 10 reps - Standing Shoulder Row with Anchored Resistance  - 1 x daily - 7 x weekly - 2 sets - 10 reps - Shoulder External Rotation with Anchored Resistance  - 1 x daily - 7 x weekly - 2 sets - 10 reps - Shoulder Internal Rotation with Resistance  - 1 x daily - 7 x weekly - 2 sets - 10 reps - Standing Bicep Curls with Resistance  - 1 x daily - 7 x weekly - 2 sets - 10 reps - Standing Shoulder Abduction AAROM with Dowel  - 2 x daily - 7 x weekly - 1 sets - 10-15 reps - Standing Shoulder Internal Rotation Stretch with Towel  - 2 x daily - 7 x weekly - 1 sets - 10 reps - 5 seconds  hold - Sleeper Stretch  - 2 x daily - 7 x weekly - 1 sets - 10 reps - 5 seconds hold - Single Arm Low Trap Setting at Wall  - 1 x daily - 7 x weekly - 1 sets - 10 reps - Single Arm Shoulder Horizontal Abduction - Thumb Up  - 1 x daily - 7 x weekly - 1 sets - 10 reps - Standing Shoulder Extension  - 1 x daily - 7 x weekly - 1 sets - 10 reps  Access Code: [email or web address] URL: [email or web address] Date: 11/28/2023 Prepared by: Towanda Fret  Exercises - Seated Scapular Retraction  - 1 x daily - 7 x weekly - 3 sets - 10 reps - Seated Cervical Retraction  - 1 x daily - 7 x weekly - 3 sets - 10 reps - Supine Shoulder Flexion Extension AAROM with Dowel  - 2 x daily - 7 x weekly - 1 sets - 10 reps - 5 seconds  hold - Supine Shoulder Abduction AAROM with Dowel  - 2 x daily - 7 x weekly - 1 sets - 10 reps - 5 seconds  hold - Seated Shoulder External Rotation AAROM with Dowel  - 2 x daily - 7 x weekly - 1 sets - 10 reps - 5 seconds  hold    ASSESSMENT:  CLINICAL IMPRESSION:     Progressed ROM and strengthening ex. With reviewing HEP as we went for D/C. Pt f/u in 2 months. Pt pleased with current level and will continue to work on ROM and strength at home.  OBJECTIVE IMPAIRMENTS: decreased activity tolerance, decreased coordination, decreased mobility, decreased ROM, decreased  strength, increased muscle spasms, impaired flexibility, impaired UE functional use, postural dysfunction, and pain.   ACTIVITY LIMITATIONS: carrying, lifting, bed mobility, bathing, toileting, dressing, reach over head, and hygiene/grooming  PARTICIPATION LIMITATIONS: meal prep, cleaning, laundry, driving, shopping, and community activity  PERSONAL FACTORS: Past/current experiences are also affecting patient's functional outcome.   REHAB POTENTIAL: Good  CLINICAL DECISION MAKING: Evolving/moderate complexity  EVALUATION COMPLEXITY: Low   GOALS: Goals reviewed with patient? Yes  SHORT TERM GOALS: Target date: 11/05/23  I with initial HEP Baseline: Goal status: 10/21/23-met  LONG TERM GOALS: Target date: 02/26/2024    I with final HEP Baseline:  Goal status: ONGOING 01/01/24   01/15/24 evolving  02/10/24 MET  2.  L shoulder AROM to be within functional range without compensation patterns to allow her to perform all functional tasks at home  Baseline:  Goal status: ONGOING/REVISED 01/01/24 but approaching functional for flexion . 01/15/24 PRogressing   02/10/24 progressing  3.  Increase L shoulder strength to 5/5 in all planes. Baseline:  Goal status:   ONGOING 01/01/24    01/15/24 progressing  02/10/24 MET WFLs except ER 4+/5  4.  Patient will be able to use LUE as normal when working in her garden including pulling weeds, hoeing, etc. Baseline:  Goal status: ONGOING 01/01/24  Progressing 01/15/24  MET 02/10/24  5.  Patient will report pain < 3/10 with all of her daily activities. Baseline:  Goal status: MET 01/01/24   PLAN:  PT FREQUENCY: 1-2x/week  PT DURATION: 8 weeks  PLANNED INTERVENTIONS: 97110-Therapeutic exercises, 97530- Therapeutic activity, 97112- Neuromuscular re-education, 97535- Self Care, 16109- Manual therapy, 97016- Vasopneumatic device, 97033- Ionotophoresis 4mg /ml Dexamethasone, Patient/Family education, Taping, Joint mobilization, Cryotherapy, and Moist heat  PLAN  FOR NEXT SESSION: D/C   PHYSICAL THERAPY DISCHARGE SUMMARY   Patient agrees to discharge. Patient goals were partially met. Patient is being discharged due to being pleased with the current functional level.  Patient Details  Name: Alicia [name] MRN: [id number] Date of Birth: 09-22-1961 Referring Provider:  Kaylee Partridge, MD  Encounter Date: 02/12/2024   Aquilla Bayley, PTA 02/12/2024, 8:46 AM  [place] [facility] [address] [place], [place], [address] Phone: [phone]   Fax:  [phone] [address] [place], [place], [address] Phone: [phone]  Fax:  [phone]  Patient Details  Name: Alicia [name] MRN: [id number] Date of Birth: [birth year] Referring Provider:  Kaylee Partridge, MD

## 2024-02-24 ENCOUNTER — Encounter: Payer: Self-pay | Admitting: Family Medicine

## 2024-02-24 DIAGNOSIS — Z944 Liver transplant status: Secondary | ICD-10-CM

## 2024-02-24 DIAGNOSIS — N183 Chronic kidney disease, stage 3 unspecified: Secondary | ICD-10-CM

## 2024-02-27 ENCOUNTER — Other Ambulatory Visit (INDEPENDENT_AMBULATORY_CARE_PROVIDER_SITE_OTHER)

## 2024-02-27 DIAGNOSIS — Z944 Liver transplant status: Secondary | ICD-10-CM

## 2024-02-27 DIAGNOSIS — N183 Chronic kidney disease, stage 3 unspecified: Secondary | ICD-10-CM

## 2024-02-27 LAB — CBC WITH DIFFERENTIAL/PLATELET
Basophils Absolute: 0 10*3/uL (ref 0.0–0.1)
Basophils Relative: 0.7 % (ref 0.0–3.0)
Eosinophils Absolute: 0.1 10*3/uL (ref 0.0–0.7)
Eosinophils Relative: 1.8 % (ref 0.0–5.0)
HCT: 35.9 % — ABNORMAL LOW (ref 36.0–46.0)
Hemoglobin: 12.3 g/dL (ref 12.0–15.0)
Lymphocytes Relative: 30.1 % (ref 12.0–46.0)
Lymphs Abs: 1 10*3/uL (ref 0.7–4.0)
MCHC: 34.1 g/dL (ref 30.0–36.0)
MCV: 90.5 fl (ref 78.0–100.0)
Monocytes Absolute: 0.3 10*3/uL (ref 0.1–1.0)
Monocytes Relative: 9.8 % (ref 3.0–12.0)
Neutro Abs: 2 10*3/uL (ref 1.4–7.7)
Neutrophils Relative %: 57.6 % (ref 43.0–77.0)
Platelets: 182 10*3/uL (ref 150.0–400.0)
RBC: 3.97 Mil/uL (ref 3.87–5.11)
RDW: 13.6 % (ref 11.5–15.5)
WBC: 3.4 10*3/uL — ABNORMAL LOW (ref 4.0–10.5)

## 2024-02-27 LAB — COMPREHENSIVE METABOLIC PANEL WITH GFR
ALT: 6 U/L (ref 0–35)
AST: 10 U/L (ref 0–37)
Albumin: 4.3 g/dL (ref 3.5–5.2)
Alkaline Phosphatase: 55 U/L (ref 39–117)
BUN: 19 mg/dL (ref 6–23)
CO2: 25 meq/L (ref 19–32)
Calcium: 8.8 mg/dL (ref 8.4–10.5)
Chloride: 107 meq/L (ref 96–112)
Creatinine, Ser: 1.7 mg/dL — ABNORMAL HIGH (ref 0.40–1.20)
GFR: 31.33 mL/min — ABNORMAL LOW (ref >60.00)
Glucose, Bld: 100 mg/dL — ABNORMAL HIGH (ref 70–99)
Potassium: 4.7 meq/L (ref 3.5–5.1)
Sodium: 140 meq/L (ref 135–145)
Total Bilirubin: 0.4 mg/dL (ref 0.2–1.2)
Total Protein: 6.1 g/dL (ref 6.0–8.3)

## 2024-02-27 LAB — CORTISOL: Cortisol, Plasma: 6.5 ug/dL

## 2024-03-01 ENCOUNTER — Ambulatory Visit: Payer: Self-pay | Admitting: Family Medicine

## 2024-03-01 LAB — ACTH: C206 ACTH: 25 pg/mL (ref 6–50)

## 2024-03-01 LAB — TACROLIMUS,HIGHLY SENSITIVE,LC/MS/MS: Tacrolimus Lvl: 4.8 ug/L — ABNORMAL LOW

## 2024-03-02 NOTE — Progress Notes (Unsigned)
 [facility] [address] [place], [place] [address] [phone]  Date:  03/03/2024   Name:  Alicia [name]   DOB:  1961/07/16   MRN:  [id number]  PCP:  Kaylee Partridge, MD    Chief Complaint: No chief complaint on file.   History of Present Illness:  Alicia [name] is a 63 y.o. very pleasant female patient who presents with the following:  Patient seen today with a skin concern.  Most recent visit with myself was in November for preop clearance-planned shoulder operation- rotator cuff repair per Dr Alfredo Ano.  She had a good result and is doing all her exercises- however it does hurt still.  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 Also history of osteoporosis, chronic kidney disease, adrenal insufficiency, portal hypertension, asthma, GERD, sarcoidosis   She sees endocrinology annually for her adrenal insufficiency  She also has pulmonology care  About 6 weeks ago she poked her left forearm on a rose thorn-it bled quite a bit for such a small wound It then developed a nodule and was tender.  A few days after the original injury the wound appeared to be full of pus, it then popped open/drained and she pulled out a good-sized piece of rose thorn.  However, the area has failed to get back to normal.  She still has a small tender nodule at the site of the original wound  Tetanus is up-to-date  Patient Active Problem List   Diagnosis Date Noted   Osteoporosis without current pathological fracture 05/25/2018   Status post liver transplant (HCC) 12/25/2017   Syncope 07/17/2017   Other cirrhosis of liver (HCC) 05/10/2017   Immunocompromised patient (HCC) 05/10/2017   Mucous cyst of digit of left hand 04/11/2017   Cellulitis, abdominal wall 03/01/2017   Normocytic anemia 03/01/2017   Hypokalemia 03/01/2017   CKD (chronic kidney disease), stage III  (HCC) 03/01/2017   Abdominal wall cellulitis 03/01/2017   Adrenal insufficiency (HCC) 03/01/2017   Hyperammonemia (HCC)    Cellulitis 06/28/2016   Thrombocytopenia (HCC) 06/28/2016   AKI (acute kidney injury) (HCC) 06/28/2016   Multiple pulmonary nodules determined by computed tomography of lung    Chronic cough 09/07/2015   Hypertension, portal (HCC) 09/04/2011   Asthma with chronic obstructive pulmonary disease (COPD) (HCC) 09/04/2011   Vocal cord nodules 07/04/2011   GERD (gastroesophageal reflux disease) 07/04/2011   Sarcoidosis 07/01/2011   Asthma, severe persistent, with upper airway instability 06/13/2011   Congenital hepatic fibrosis 07/03/2000    Past Medical History:  Diagnosis Date   Allergy    Anemia    Asthma    Cellulitis    Cirrhosis (HCC)    GERD (gastroesophageal reflux disease)    Hepatic fibrosis    congenital   History of UTI    chronic as a child   Sarcoidosis    Spleen enlarged     Past Surgical History:  Procedure Laterality Date   COLONOSCOPY     CYST EXCISION Left 05/01/2017   Procedure: LEFT INDEX FINGER CYST EXCISION;  Surgeon: Wes Hamman, MD;  Location: [facility] OR;  Service: Orthopedics;  Laterality: Left;   ESOPHAGOGASTRODUODENOSCOPY     LIVER TRANSPLANT     SPINAL FUSION  12/2007   Double spinal Fusion   VIDEO BRONCHOSCOPY Bilateral 12/08/2015   Procedure: VIDEO BRONCHOSCOPY WITH FLUORO;  Surgeon: Celene Coins  V, MD;  Location: [facility] ENDOSCOPY;  Service: Cardiopulmonary;  Laterality: Bilateral;    Social History   Tobacco Use   Smoking status: Never   Smokeless tobacco: Never  Substance Use Topics   Alcohol use: No   Drug use: No    Family History  Problem Relation Age of Onset   Arthritis Mother    Heart disease Father        Triple bypass   Migraines Father    Heart attack Paternal Grandmother    Stroke Paternal Grandfather    Liver disease Sister        Juvenile cysitc liver disease   Asthma Sister     Allergies  Allergen  Reactions   Aspirin Other (See Comments)    Causes Asthma   Peanut-Containing Drug Products Shortness Of Breath    Asthma, eye swelling, itchy eyes, can't catch her breath   Gabapentin Other (See Comments)    Blisters in mouth, nausea    Statins     Asthma, eye swelling, itchy eyes, can't catch her breath   Azithromycin Nausea And Vomiting    Medication list has been reviewed and updated.  Current Outpatient Medications on File Prior to Visit  Medication Sig Dispense Refill   albuterol  (VENTOLIN  HFA) 108 (90 Base) MCG/ACT inhaler INHALE 2 PUFFS INTO THE LUNGS EVERY 6 HOURS AS NEEDED FOR WHEEZING OR SHORTNESS OF BREATH 6.7 g 3   arformoterol  (BROVANA ) 15 MCG/2ML NEBU Substituted for: Brovana  Neb Solution Inhale one vial in nebulizer twice a day. 2 mL 11   budesonide  (PULMICORT ) 0.25 MG/2ML nebulizer solution Generic for Pulmicort . Inhale one vial in nebulizer twice a day. Rinse mouth after use. 60 mL 11   clotrimazole  (MYCELEX ) 10 MG troche Take 1 tablet (10 mg total) by mouth 5 (five) times daily. Allow to dissolve slowly 35 Troche 0   ipratropium (ATROVENT ) 0.02 % nebulizer solution Inhale one vial in nebulizer every 6 hours 120 mL 11   ipratropium (ATROVENT ) 0.03 % nasal spray USE 2 SPRAYS IN EACH NOSTRIL EVERY 12 HOURS 30 mL 12   levocetirizine (XYZAL) 2.5 MG/5ML solution Take 2.5 mg by mouth every evening.     pantoprazole  (PROTONIX ) 40 MG tablet Take 40 mg by mouth daily.     tacrolimus  (PROGRAF ) 1 MG capsule Take 1 mg by mouth 2 (two) times a day. TAKE 2 TABLETS (3MG  TOTAL) IN THE MORNING AND TWO  TABLETS (2MG  TOTAL) IN THE EVENING.     UNABLE TO FIND 25 mg. Med Name: Verapamil 25 mg daily     verapamil (CALAN-SR) 120 MG CR tablet Take 120 mg by mouth daily.     losartan (COZAAR) 25 MG tablet Take 25 mg by mouth daily. (Patient not taking: Reported on 03/03/2024)     No current facility-administered medications on file prior to visit.    Review of Systems:  As per HPI- otherwise  negative.   Physical Examination: Vitals:   03/03/24 1534 03/03/24 1601  BP: (!) 176/76 (!) 140/90  Pulse: 81   Resp: 16   Temp: 98.8 F (37.1 C)   SpO2: 99%    Vitals:   03/03/24 1534  Weight: 150 lb (68 kg)  Height: 5\' 3"  (1.6 m)   Body mass index is 26.57 kg/m. Ideal Body Weight: Weight in (lb) to have BMI = 25: 140.8  GEN: no acute distress.  Normal weight, looks well HEENT: Atraumatic, Normocephalic.  Ears and Nose: No external deformity. CV: RRR, No M/G/R. No  JVD. No thrill. No extra heart sounds. PULM: CTA B, no wheezes, crackles, rhonchi. No retractions. No resp. distress. No accessory muscle use. EXTR: No c/c/e PSYCH: Normally interactive. Conversant.  Left arm displays an approximately 5 mm diameter flesh-colored nodule on the dorsal aspect of the forearm.  It is tender to the touch but does not seem infected.  It is not erythematous and there is no sign of pus  Verbal consent obtained.  Nodule prepped with alcohol and punctured with a needle, but there was no drainage from the nodule.  I reconsented patient for a scalpel I&D Area prepped with Betadine, anesthesia with a small wheal of 1% lidocaine  with epinephrine .  Used an 11 blade to incise over the top of the nodule.  I was not able to locate a foreign body such as a thorn.  However when I squeezed the nodule hoping to find any foreign body there was a popping sensation likely consistent with collapse of a small cyst.  Nodule smaller in size after this treatment.  Hemostasis with pressure, dressing placed Patient tolerated procedure well with no immediate complications.  Estimated blood loss less than 1 mL  Patient also shows me her left great toenail.  She dropped something on it several months ago-there is a clear line across the proximal nail where healthy nail is growing in, suspect there is some subungual hematoma.  The nail also has excessive curvature making it susceptible to ingrown  Assessment and  Plan: Cyst of skin and subcutaneous tissue  Patient seen today with a likely subcutaneous cyst resulting from a wound and retained rose thorn in her left forearm.  Incised over nodule and was able to squeeze out/rupture cyst as above.  Discussed wound care with patient in oral and written form We held this problem is now resolved.  However I advised [place] if it comes back we will consult with dermatology.  She will let me know if any sign of infection or other concerns  Signed Gates Kasal, MD

## 2024-03-03 ENCOUNTER — Ambulatory Visit (INDEPENDENT_AMBULATORY_CARE_PROVIDER_SITE_OTHER): Admitting: Family Medicine

## 2024-03-03 ENCOUNTER — Encounter: Payer: Self-pay | Admitting: Family Medicine

## 2024-03-03 VITALS — BP 140/90 | HR 81 | Temp 98.8°F | Resp 16 | Ht 63.0 in | Wt 150.0 lb

## 2024-03-03 DIAGNOSIS — L720 Epidermal cyst: Secondary | ICD-10-CM | POA: Diagnosis not present

## 2024-03-03 NOTE — Patient Instructions (Addendum)
 It was good to see you today-please let me know if the area on your left arm is not healing or if it seems infected. Leave the bandage in place for about 2 hours; then you can lossen it up.  Loosen sooner if you have discomfort  Keep it clean and dry for about 24 hours and then you can bathe as usual and keep it covered until it heals sufficiently   If you like, try a toenail straightener kit off amaz[employer] for your left great toenail

## 2024-03-09 ENCOUNTER — Other Ambulatory Visit: Payer: Self-pay | Admitting: Pulmonary Disease

## 2024-03-09 DIAGNOSIS — K219 Gastro-esophageal reflux disease without esophagitis: Secondary | ICD-10-CM | POA: Diagnosis not present

## 2024-03-09 DIAGNOSIS — J4550 Severe persistent asthma, uncomplicated: Secondary | ICD-10-CM

## 2024-03-09 DIAGNOSIS — R1319 Other dysphagia: Secondary | ICD-10-CM | POA: Diagnosis not present

## 2024-03-10 DIAGNOSIS — K2289 Other specified disease of esophagus: Secondary | ICD-10-CM | POA: Diagnosis not present

## 2024-03-10 DIAGNOSIS — R1319 Other dysphagia: Secondary | ICD-10-CM | POA: Diagnosis not present

## 2024-03-10 DIAGNOSIS — K219 Gastro-esophageal reflux disease without esophagitis: Secondary | ICD-10-CM | POA: Diagnosis not present

## 2024-03-11 DIAGNOSIS — K219 Gastro-esophageal reflux disease without esophagitis: Secondary | ICD-10-CM | POA: Diagnosis not present

## 2024-03-11 DIAGNOSIS — R1319 Other dysphagia: Secondary | ICD-10-CM | POA: Diagnosis not present

## 2024-03-24 DIAGNOSIS — M7502 Adhesive capsulitis of left shoulder: Secondary | ICD-10-CM | POA: Diagnosis not present

## 2024-03-30 DIAGNOSIS — J385 Laryngeal spasm: Secondary | ICD-10-CM | POA: Diagnosis not present

## 2024-03-30 DIAGNOSIS — R1319 Other dysphagia: Secondary | ICD-10-CM | POA: Diagnosis not present

## 2024-03-30 DIAGNOSIS — D869 Sarcoidosis, unspecified: Secondary | ICD-10-CM | POA: Diagnosis not present

## 2024-03-30 DIAGNOSIS — K219 Gastro-esophageal reflux disease without esophagitis: Secondary | ICD-10-CM | POA: Diagnosis not present

## 2024-03-30 DIAGNOSIS — R490 Dysphonia: Secondary | ICD-10-CM | POA: Diagnosis not present

## 2024-03-30 DIAGNOSIS — K224 Dyskinesia of esophagus: Secondary | ICD-10-CM | POA: Diagnosis not present

## 2024-03-30 DIAGNOSIS — R053 Chronic cough: Secondary | ICD-10-CM | POA: Diagnosis not present

## 2024-03-30 DIAGNOSIS — J387 Other diseases of larynx: Secondary | ICD-10-CM | POA: Diagnosis not present

## 2024-03-30 DIAGNOSIS — J383 Other diseases of vocal cords: Secondary | ICD-10-CM | POA: Diagnosis not present

## 2024-04-29 DIAGNOSIS — R490 Dysphonia: Secondary | ICD-10-CM | POA: Diagnosis not present

## 2024-04-29 DIAGNOSIS — J387 Other diseases of larynx: Secondary | ICD-10-CM | POA: Diagnosis not present

## 2024-04-29 DIAGNOSIS — K224 Dyskinesia of esophagus: Secondary | ICD-10-CM | POA: Diagnosis not present

## 2024-04-29 DIAGNOSIS — J385 Laryngeal spasm: Secondary | ICD-10-CM | POA: Diagnosis not present

## 2024-04-29 DIAGNOSIS — D869 Sarcoidosis, unspecified: Secondary | ICD-10-CM | POA: Diagnosis not present

## 2024-04-29 DIAGNOSIS — K219 Gastro-esophageal reflux disease without esophagitis: Secondary | ICD-10-CM | POA: Diagnosis not present

## 2024-04-29 DIAGNOSIS — J383 Other diseases of vocal cords: Secondary | ICD-10-CM | POA: Diagnosis not present

## 2024-04-29 DIAGNOSIS — R1319 Other dysphagia: Secondary | ICD-10-CM | POA: Diagnosis not present

## 2024-04-29 DIAGNOSIS — R053 Chronic cough: Secondary | ICD-10-CM | POA: Diagnosis not present

## 2024-05-12 DIAGNOSIS — J387 Other diseases of larynx: Secondary | ICD-10-CM | POA: Diagnosis not present

## 2024-05-12 DIAGNOSIS — D869 Sarcoidosis, unspecified: Secondary | ICD-10-CM | POA: Diagnosis not present

## 2024-05-12 DIAGNOSIS — R490 Dysphonia: Secondary | ICD-10-CM | POA: Diagnosis not present

## 2024-05-12 DIAGNOSIS — R1319 Other dysphagia: Secondary | ICD-10-CM | POA: Diagnosis not present

## 2024-05-12 DIAGNOSIS — J383 Other diseases of vocal cords: Secondary | ICD-10-CM | POA: Diagnosis not present

## 2024-05-12 DIAGNOSIS — K224 Dyskinesia of esophagus: Secondary | ICD-10-CM | POA: Diagnosis not present

## 2024-05-12 DIAGNOSIS — J385 Laryngeal spasm: Secondary | ICD-10-CM | POA: Diagnosis not present

## 2024-05-12 DIAGNOSIS — R053 Chronic cough: Secondary | ICD-10-CM | POA: Diagnosis not present

## 2024-05-12 DIAGNOSIS — K219 Gastro-esophageal reflux disease without esophagitis: Secondary | ICD-10-CM | POA: Diagnosis not present

## 2024-05-24 ENCOUNTER — Ambulatory Visit (INDEPENDENT_AMBULATORY_CARE_PROVIDER_SITE_OTHER): Admitting: Podiatry

## 2024-05-24 VITALS — Ht 63.0 in | Wt 150.0 lb

## 2024-05-24 DIAGNOSIS — L603 Nail dystrophy: Secondary | ICD-10-CM | POA: Diagnosis not present

## 2024-05-24 MED ORDER — TERBINAFINE HCL 1 % EX CREA: 1.0000 | TOPICAL_CREAM | Freq: Two times a day (BID) | CUTANEOUS | Status: AC

## 2024-05-24 MED ORDER — CEFADROXIL 500 MG PO CAPS: 500.0000 mg | ORAL_CAPSULE | Freq: Two times a day (BID) | ORAL | 0 refills | Status: AC

## 2024-05-24 NOTE — Progress Notes (Signed)
  Subjective:  Patient ID: Alicia [name], female    DOB: [birth year],  MRN: [id number]  Chief Complaint  Patient presents with   Ingrown Toenail    Rm 10 Patient is here for ingrown toenail of the left hallux. Patient states pain on both lateral and medial side. Unable to wear closed-toed shoes due to pain left hallux.    63 y.o. female presents with the above complaint. History confirmed with patient. This occurred after 2 injuries about 2.5 years ago  Objective:  Physical Exam: warm, good capillary refill, no trophic changes or ulcerative lesions, normal DP and PT pulses, normal sensory exam, and dystrophic left hallux nail   Assessment:   1. Nail dystrophy      Plan:  Patient was evaluated and treated and all questions answered.     Ingrown Nail, left -Patient elects to proceed with minor surgery to remove ingrown toenail today. Consent reviewed and signed by patient. -Ingrown nail excised. See procedure note. -Educated on post-procedure care including soaking. Written instructions provided and reviewed. -Rx for duricef sent to pharmacy due to immunocompromised status for infection ppx. -Advised on signs and symptoms of infection developing.     Procedure: Excision of Ingrown Toenail Location: Left 1st toe nail Anesthesia: Lidocaine  1% plain; 1.5 mL and Marcaine  0.5% plain; 1.5 mL, digital block. Skin Prep: Betadine. Dressing: Silvadene; telfa; dry, sterile, compression dressing. Technique: Following skin prep, the toe was exsanguinated and a tourniquet was secured at the base of the toe. The affected nail was freed, and excised. The wound bed has been irrigated out with alcohol. The tourniquet was then removed and sterile dressing applied. Disposition: Patient tolerated procedure well.    Return if symptoms worsen or fail to improve.

## 2024-05-24 NOTE — Patient Instructions (Signed)
 Soak Instructions    THE DAY AFTER THE PROCEDURE  Place 1/4 cup of epsom salts (or betadine, or white vinegar) in a quart of warm tap water.  Submerge your foot or feet with outer bandage intact for the initial soak; this will allow the bandage to become moist and wet for easy lift off.  Once you remove your bandage, continue to soak in the solution for 20 minutes.  This soak should be done twice a day.  Next, remove your foot or feet from solution, blot dry the affected area and apply Neosporin or triple antibiotic ointment.  Then cover with a regular Band-Aid.  Do this for at least 2 weeks.  Longer if you are still having drainage redness or irritation  After the 2 weeks you do not need to soak or bandage the toe, but apply the terbinafine  (lamisil ) cream to the nail bed to prevent fungus growing into the toe  IF YOUR SKIN BECOMES IRRITATED WHILE USING THESE INSTRUCTIONS, IT IS OKAY TO SWITCH TO  WHITE VINEGAR AND WATER. Or you may use antibacterial soap and water to keep the toe clean  Monitor for any signs/symptoms of infection. Call the office immediately if any occur or go directly to the emergency room. Call with any questions/concerns.

## 2024-05-26 DIAGNOSIS — J385 Laryngeal spasm: Secondary | ICD-10-CM | POA: Diagnosis not present

## 2024-05-26 DIAGNOSIS — R053 Chronic cough: Secondary | ICD-10-CM | POA: Diagnosis not present

## 2024-05-26 DIAGNOSIS — K219 Gastro-esophageal reflux disease without esophagitis: Secondary | ICD-10-CM | POA: Diagnosis not present

## 2024-05-26 DIAGNOSIS — R1319 Other dysphagia: Secondary | ICD-10-CM | POA: Diagnosis not present

## 2024-05-26 DIAGNOSIS — J383 Other diseases of vocal cords: Secondary | ICD-10-CM | POA: Diagnosis not present

## 2024-05-26 DIAGNOSIS — J387 Other diseases of larynx: Secondary | ICD-10-CM | POA: Diagnosis not present

## 2024-05-26 DIAGNOSIS — D869 Sarcoidosis, unspecified: Secondary | ICD-10-CM | POA: Diagnosis not present

## 2024-05-26 DIAGNOSIS — K224 Dyskinesia of esophagus: Secondary | ICD-10-CM | POA: Diagnosis not present

## 2024-05-26 DIAGNOSIS — R490 Dysphonia: Secondary | ICD-10-CM | POA: Diagnosis not present

## 2024-06-03 DIAGNOSIS — B078 Other viral warts: Secondary | ICD-10-CM | POA: Diagnosis not present

## 2024-06-03 DIAGNOSIS — L821 Other seborrheic keratosis: Secondary | ICD-10-CM | POA: Diagnosis not present

## 2024-06-03 DIAGNOSIS — D485 Neoplasm of uncertain behavior of skin: Secondary | ICD-10-CM | POA: Diagnosis not present

## 2024-06-03 DIAGNOSIS — D225 Melanocytic nevi of trunk: Secondary | ICD-10-CM | POA: Diagnosis not present

## 2024-06-03 DIAGNOSIS — L578 Other skin changes due to chronic exposure to nonionizing radiation: Secondary | ICD-10-CM | POA: Diagnosis not present

## 2024-06-16 ENCOUNTER — Other Ambulatory Visit: Payer: Self-pay | Admitting: Pulmonary Disease

## 2024-06-16 DIAGNOSIS — J4550 Severe persistent asthma, uncomplicated: Secondary | ICD-10-CM

## 2024-06-17 DIAGNOSIS — J387 Other diseases of larynx: Secondary | ICD-10-CM | POA: Diagnosis not present

## 2024-06-17 DIAGNOSIS — J383 Other diseases of vocal cords: Secondary | ICD-10-CM | POA: Diagnosis not present

## 2024-06-17 DIAGNOSIS — J385 Laryngeal spasm: Secondary | ICD-10-CM | POA: Diagnosis not present

## 2024-06-17 DIAGNOSIS — R053 Chronic cough: Secondary | ICD-10-CM | POA: Diagnosis not present

## 2024-06-17 DIAGNOSIS — D869 Sarcoidosis, unspecified: Secondary | ICD-10-CM | POA: Diagnosis not present

## 2024-06-17 DIAGNOSIS — K219 Gastro-esophageal reflux disease without esophagitis: Secondary | ICD-10-CM | POA: Diagnosis not present

## 2024-06-17 DIAGNOSIS — R490 Dysphonia: Secondary | ICD-10-CM | POA: Diagnosis not present

## 2024-06-17 DIAGNOSIS — K224 Dyskinesia of esophagus: Secondary | ICD-10-CM | POA: Diagnosis not present

## 2024-06-17 DIAGNOSIS — R1319 Other dysphagia: Secondary | ICD-10-CM | POA: Diagnosis not present

## 2024-06-28 DIAGNOSIS — K080 Exfoliation of teeth due to systemic causes: Secondary | ICD-10-CM | POA: Diagnosis not present

## 2024-07-05 ENCOUNTER — Encounter: Payer: Self-pay | Admitting: Family Medicine

## 2024-07-05 DIAGNOSIS — Z944 Liver transplant status: Secondary | ICD-10-CM

## 2024-07-07 ENCOUNTER — Other Ambulatory Visit (INDEPENDENT_AMBULATORY_CARE_PROVIDER_SITE_OTHER)

## 2024-07-07 DIAGNOSIS — Z944 Liver transplant status: Secondary | ICD-10-CM

## 2024-07-07 LAB — COMPREHENSIVE METABOLIC PANEL WITH GFR
ALT: 6 U/L (ref 0–35)
AST: 9 U/L (ref 0–37)
Albumin: 4.4 g/dL (ref 3.5–5.2)
Alkaline Phosphatase: 60 U/L (ref 39–117)
BUN: 16 mg/dL (ref 6–23)
CO2: 26 meq/L (ref 19–32)
Calcium: 8.9 mg/dL (ref 8.4–10.5)
Chloride: 106 meq/L (ref 96–112)
Creatinine, Ser: 1.69 mg/dL — ABNORMAL HIGH (ref 0.40–1.20)
GFR: 31.47 mL/min — ABNORMAL LOW (ref >60.00)
Glucose, Bld: 111 mg/dL — ABNORMAL HIGH (ref 70–99)
Potassium: 4.6 meq/L (ref 3.5–5.1)
Sodium: 140 meq/L (ref 135–145)
Total Bilirubin: 0.6 mg/dL (ref 0.2–1.2)
Total Protein: 6.4 g/dL (ref 6.0–8.3)

## 2024-07-07 LAB — CBC WITH DIFFERENTIAL/PLATELET
Basophils Absolute: 0 K/uL (ref 0.0–0.1)
Basophils Relative: 0.5 % (ref 0.0–3.0)
Eosinophils Absolute: 0.1 K/uL (ref 0.0–0.7)
Eosinophils Relative: 1.4 % (ref 0.0–5.0)
HCT: 36.7 % (ref 36.0–46.0)
Hemoglobin: 12.5 g/dL (ref 12.0–15.0)
Lymphocytes Relative: 25.3 % (ref 12.0–46.0)
Lymphs Abs: 1.1 K/uL (ref 0.7–4.0)
MCHC: 34.1 g/dL (ref 30.0–36.0)
MCV: 89.6 fl (ref 78.0–100.0)
Monocytes Absolute: 0.4 K/uL (ref 0.1–1.0)
Monocytes Relative: 8.6 % (ref 3.0–12.0)
Neutro Abs: 2.8 K/uL (ref 1.4–7.7)
Neutrophils Relative %: 64.2 % (ref 43.0–77.0)
Platelets: 196 K/uL (ref 150.0–400.0)
RBC: 4.1 Mil/uL (ref 3.87–5.11)
RDW: 13.1 % (ref 11.5–15.5)
WBC: 4.3 K/uL (ref 4.0–10.5)

## 2024-07-07 LAB — CORTISOL: Cortisol, Plasma: 8.3 ug/dL

## 2024-07-09 ENCOUNTER — Other Ambulatory Visit (HOSPITAL_BASED_OUTPATIENT_CLINIC_OR_DEPARTMENT_OTHER): Payer: Self-pay | Admitting: Family Medicine

## 2024-07-09 DIAGNOSIS — Z139 Encounter for screening, unspecified: Secondary | ICD-10-CM

## 2024-07-09 LAB — TACROLIMUS,HIGHLY SENSITIVE,LC/MS/MS: Tacrolimus Lvl: 5.3 ug/L

## 2024-07-11 LAB — ACTH: C206 ACTH: 30 pg/mL (ref 6–50)

## 2024-07-12 ENCOUNTER — Ambulatory Visit: Payer: Self-pay | Admitting: Family Medicine

## 2024-08-03 DIAGNOSIS — K080 Exfoliation of teeth due to systemic causes: Secondary | ICD-10-CM | POA: Diagnosis not present

## 2024-08-10 DIAGNOSIS — J387 Other diseases of larynx: Secondary | ICD-10-CM | POA: Diagnosis not present

## 2024-08-10 DIAGNOSIS — K219 Gastro-esophageal reflux disease without esophagitis: Secondary | ICD-10-CM | POA: Diagnosis not present

## 2024-08-10 DIAGNOSIS — J385 Laryngeal spasm: Secondary | ICD-10-CM | POA: Diagnosis not present

## 2024-08-10 DIAGNOSIS — J383 Other diseases of vocal cords: Secondary | ICD-10-CM | POA: Diagnosis not present

## 2024-08-10 DIAGNOSIS — K224 Dyskinesia of esophagus: Secondary | ICD-10-CM | POA: Diagnosis not present

## 2024-08-10 DIAGNOSIS — R1319 Other dysphagia: Secondary | ICD-10-CM | POA: Diagnosis not present

## 2024-08-10 DIAGNOSIS — D869 Sarcoidosis, unspecified: Secondary | ICD-10-CM | POA: Diagnosis not present

## 2024-08-10 DIAGNOSIS — R490 Dysphonia: Secondary | ICD-10-CM | POA: Diagnosis not present

## 2024-08-10 DIAGNOSIS — R053 Chronic cough: Secondary | ICD-10-CM | POA: Diagnosis not present

## 2024-08-11 DIAGNOSIS — N1832 Chronic kidney disease, stage 3b: Secondary | ICD-10-CM | POA: Diagnosis not present

## 2024-08-11 DIAGNOSIS — I129 Hypertensive chronic kidney disease with stage 1 through stage 4 chronic kidney disease, or unspecified chronic kidney disease: Secondary | ICD-10-CM | POA: Diagnosis not present

## 2024-08-11 DIAGNOSIS — Z944 Liver transplant status: Secondary | ICD-10-CM | POA: Diagnosis not present

## 2024-08-11 DIAGNOSIS — R059 Cough, unspecified: Secondary | ICD-10-CM | POA: Diagnosis not present

## 2024-08-16 ENCOUNTER — Encounter (HOSPITAL_BASED_OUTPATIENT_CLINIC_OR_DEPARTMENT_OTHER): Payer: Self-pay | Admitting: Family Medicine

## 2024-08-17 ENCOUNTER — Ambulatory Visit (HOSPITAL_BASED_OUTPATIENT_CLINIC_OR_DEPARTMENT_OTHER)
Admission: RE | Admit: 2024-08-17 | Discharge: 2024-08-17 | Disposition: A | Discharge status: Home / Self Care | Source: Ambulatory Visit | Attending: Family Medicine | Admitting: Family Medicine

## 2024-08-17 ENCOUNTER — Encounter (HOSPITAL_BASED_OUTPATIENT_CLINIC_OR_DEPARTMENT_OTHER): Payer: Self-pay

## 2024-08-17 ENCOUNTER — Encounter: Payer: Self-pay | Admitting: Family Medicine

## 2024-08-17 DIAGNOSIS — Z1231 Encounter for screening mammogram for malignant neoplasm of breast: Secondary | ICD-10-CM | POA: Insufficient documentation

## 2024-08-17 DIAGNOSIS — M810 Age-related osteoporosis without current pathological fracture: Secondary | ICD-10-CM | POA: Diagnosis not present

## 2024-08-17 DIAGNOSIS — Z7952 Long term (current) use of systemic steroids: Secondary | ICD-10-CM | POA: Diagnosis not present

## 2024-08-17 DIAGNOSIS — Z139 Encounter for screening, unspecified: Secondary | ICD-10-CM

## 2024-08-17 DIAGNOSIS — Z780 Asymptomatic menopausal state: Secondary | ICD-10-CM | POA: Diagnosis not present

## 2024-08-24 DIAGNOSIS — R1319 Other dysphagia: Secondary | ICD-10-CM | POA: Diagnosis not present

## 2024-08-24 DIAGNOSIS — J387 Other diseases of larynx: Secondary | ICD-10-CM | POA: Diagnosis not present

## 2024-08-24 DIAGNOSIS — K219 Gastro-esophageal reflux disease without esophagitis: Secondary | ICD-10-CM | POA: Diagnosis not present

## 2024-08-24 DIAGNOSIS — D869 Sarcoidosis, unspecified: Secondary | ICD-10-CM | POA: Diagnosis not present

## 2024-08-24 DIAGNOSIS — J385 Laryngeal spasm: Secondary | ICD-10-CM | POA: Diagnosis not present

## 2024-08-24 DIAGNOSIS — R053 Chronic cough: Secondary | ICD-10-CM | POA: Diagnosis not present

## 2024-08-24 DIAGNOSIS — J383 Other diseases of vocal cords: Secondary | ICD-10-CM | POA: Diagnosis not present

## 2024-08-24 DIAGNOSIS — R490 Dysphonia: Secondary | ICD-10-CM | POA: Diagnosis not present

## 2024-08-24 DIAGNOSIS — K224 Dyskinesia of esophagus: Secondary | ICD-10-CM | POA: Diagnosis not present

## 2024-09-14 ENCOUNTER — Encounter (HOSPITAL_BASED_OUTPATIENT_CLINIC_OR_DEPARTMENT_OTHER): Payer: Self-pay

## 2024-09-17 ENCOUNTER — Other Ambulatory Visit: Payer: Self-pay | Admitting: Pulmonary Disease

## 2024-09-17 DIAGNOSIS — J4550 Severe persistent asthma, uncomplicated: Secondary | ICD-10-CM

## 2024-09-20 ENCOUNTER — Other Ambulatory Visit: Payer: Self-pay

## 2024-09-20 ENCOUNTER — Ambulatory Visit (INDEPENDENT_AMBULATORY_CARE_PROVIDER_SITE_OTHER): Admitting: Family

## 2024-09-20 ENCOUNTER — Other Ambulatory Visit (HOSPITAL_BASED_OUTPATIENT_CLINIC_OR_DEPARTMENT_OTHER): Payer: Self-pay

## 2024-09-20 ENCOUNTER — Ambulatory Visit: Payer: Self-pay | Admitting: *Deleted

## 2024-09-20 ENCOUNTER — Telehealth: Payer: Self-pay

## 2024-09-20 ENCOUNTER — Encounter: Payer: Self-pay | Admitting: Family

## 2024-09-20 VITALS — BP 147/93 | HR 93 | Temp 98.1°F | Resp 16 | Ht 63.0 in | Wt 146.0 lb

## 2024-09-20 DIAGNOSIS — H5789 Other specified disorders of eye and adnexa: Secondary | ICD-10-CM | POA: Diagnosis not present

## 2024-09-20 MED ORDER — BACITRA-NEOMYCIN-POLYMYXIN-HC 1 % OP OINT: 1.0000 | TOPICAL_OINTMENT | Freq: Two times a day (BID) | OPHTHALMIC | 0 refills | Status: AC

## 2024-09-20 MED ORDER — BACITRA-NEOMYCIN-POLYMYXIN-HC 1 % OP OINT: 1.0000 | TOPICAL_OINTMENT | Freq: Two times a day (BID) | OPHTHALMIC | 0 refills | Status: DC

## 2024-09-20 MED ORDER — BACITRA-NEOMYCIN-POLYMYXIN-HC 1 % OP OINT
1.0000 | TOPICAL_OINTMENT | Freq: Two times a day (BID) | OPHTHALMIC | 0 refills | Status: DC
  Filled 2024-09-20: qty 3.5, 10d supply, fill #0

## 2024-09-20 NOTE — Telephone Encounter (Signed)
 Copied from CRM #[id number]. Topic: Clinical - Prescription Issue >> Sep 20, 2024  3:18 PM Berneda F wrote: Reason for CRM: Patient states that [facility] did not get the order for the bacitracin -neomycin -polymyxin-hydrocortisone  (CORTISPORIN) 1 % ophthalmic ointment but I can see that it was sent to them at 3:12 PM. She is there at [facility] now and they are checking again. After checking again, they do have the order for it, but do not have it in stock. They state they will have it by tomorrow which she says is fine. No further concerns noted.

## 2024-09-20 NOTE — Telephone Encounter (Signed)
 Appt scheduled

## 2024-09-20 NOTE — Progress Notes (Signed)
 "  Acute Office Visit  Subjective:     Patient ID: Alicia [name], female    DOB: 02/27/1961, 63 y.o.   MRN: [id number]  Chief Complaint  Patient presents with   Eye Pain    Patient has left eye pain and irritation    HPI Patient is in today with complaints of left eye irritation after getting a piece of lint on her eye 3 days ago.  She continues to have irritation.  Reports this morning she had matting and crusting and had to use a warm compress to open her eye.  Denies any redness.  Has some blurred vision but no worse than her normal.  She had implants placed in April.  Review of Systems  Constitutional: Negative.   Eyes:  Positive for discharge.       Irritation to the left eye  Respiratory: Negative.    Cardiovascular: Negative.   Musculoskeletal: Negative.   Neurological: Negative.   Psychiatric/Behavioral: Negative.      Past Medical History:  Diagnosis Date   Allergy    Anemia    Asthma    Cellulitis    Cirrhosis (HCC)    GERD (gastroesophageal reflux disease)    Hepatic fibrosis    congenital   History of UTI    chronic as a child   Sarcoidosis    Spleen enlarged     Social History   Socioeconomic History   Marital status: Married    Spouse name: Not on file   Number of children: Not on file   Years of education: Not on file   Highest education level: Not on file  Occupational History   Occupation: HR Manager    Employer: [employer]  Tobacco Use   Smoking status: Never   Smokeless tobacco: Never  Substance and Sexual Activity   Alcohol use: No   Drug use: No   Sexual activity: Not on file  Other Topics Concern   Not on file  Social History Narrative   Not on file   Social Drivers of Health   Tobacco Use: Low Risk (09/20/2024)   Patient History    Smoking Tobacco Use: Never    Smokeless Tobacco Use: Never    Passive Exposure: Not on file  Financial Resource Strain: Patient Declined (08/14/2023)   Received  from [facility]   Overall Financial Resource Strain (CARDIA)    Difficulty of Paying Living Expenses: Patient declined  Food Insecurity: Patient Declined (08/14/2023)   Received from [facility]   Epic    Within the past 12 months, the food you bought just didn't last and you didn't have money to get more.: Patient declined    Within the past 12 months, you worried that your food would run out before you got the money to buy more.: Patient declined  Transportation Needs: Patient Declined (08/14/2023)   Received from [facility]   PRAPARE - Transportation    Lack of Transportation (Non-Medical): Patient declined    In the past 12 months, has lack of transportation kept you from medical appointments or from getting medications?: Patient declined  Physical Activity: Not on file  Stress: Not on file  Social Connections: Not on file  Intimate Partner Violence: Not At Risk (08/20/2022)   Humiliation, Afraid, Rape, and Kick questionnaire    Fear of Current or Ex-Partner: No    Emotionally Abused: No    Physically Abused: No    Sexually Abused: No  Depression (PHQ2-9): Low Risk (02/11/2023)   Depression (PHQ2-9)    PHQ-2 Score: 0  Alcohol Screen: Low Risk (08/20/2022)   Alcohol Screen    Last Alcohol Screening Score (AUDIT): 0  Housing: Unknown (10/03/2023)   Received from [facility]   Epic    At any time in the past 12 months, were you homeless or living in a shelter (including now)?: No    Number of Times Moved in the Last Year: Not on file    In the last 12 months, was there a time when you were not able to pay the mortgage or rent on time?: Patient declined  Utilities: Patient Declined (08/14/2023)   Received from [facility] Utilities    Threatened with loss of utilities: Patient declined  Health Literacy: Not on file    Past Surgical History:  Procedure Laterality Date    COLONOSCOPY     CYST EXCISION Left 05/01/2017   Procedure: LEFT INDEX FINGER CYST EXCISION;  Surgeon: Jerri Kay HERO, MD;  Location: [facility] OR;  Service: Orthopedics;  Laterality: Left;   ESOPHAGOGASTRODUODENOSCOPY     LIVER TRANSPLANT     SPINAL FUSION  12/2007   Double spinal Fusion   VIDEO BRONCHOSCOPY Bilateral 12/08/2015   Procedure: VIDEO BRONCHOSCOPY WITH FLUORO;  Surgeon: Harden Jude GAILS, MD;  Location: [facility] ENDOSCOPY;  Service: Cardiopulmonary;  Laterality: Bilateral;    Family History  Problem Relation Age of Onset   Arthritis Mother    Heart disease Father        Triple bypass   Migraines Father    Heart attack Paternal Grandmother    Stroke Paternal Grandfather    Liver disease Sister        Juvenile cysitc liver disease   Asthma Sister     Allergies[1]  Medications Ordered Prior to Encounter[2]  BP (!) 147/93 (BP Location: Left Arm, Patient Position: Sitting, Cuff Size: Normal)   Pulse 93   Temp 98.1 F (36.7 C) (Oral)   Resp 16   Ht 5' 3 (1.6 m)   Wt 146 lb (66.2 kg)   SpO2 99%   BMI 25.86 kg/m chart     Objective:    BP (!) 147/93 (BP Location: Left Arm, Patient Position: Sitting, Cuff Size: Normal)   Pulse 93   Temp 98.1 F (36.7 C) (Oral)   Resp 16   Ht 5' 3 (1.6 m)   Wt 146 lb (66.2 kg)   SpO2 99%   BMI 25.86 kg/m    Physical Exam Vitals and nursing note reviewed.  Constitutional:      Appearance: Normal appearance. She is normal weight.  HENT:     Head: Normocephalic.     Right Ear: Tympanic membrane normal.     Left Ear: Tympanic membrane normal.  Eyes:     Extraocular Movements: Extraocular movements intact.     Conjunctiva/sclera: Conjunctivae normal.     Pupils: Pupils are equal, round, and reactive to light.     Comments: No obvious irritation.  Mild upper lid swelling.  Cardiovascular:     Pulses: Normal pulses.     Heart sounds: Normal heart sounds.  Pulmonary:     Effort: Pulmonary effort is normal.   Musculoskeletal:     Cervical back: Normal range of motion and neck supple.  Skin:    General: Skin is warm and dry.  Neurological:     General: No focal deficit present.  Mental Status: She is alert and oriented to person, place, and time. Mental status is at baseline.  Psychiatric:        Mood and Affect: Mood normal.        Behavior: Behavior normal.    No results found for any visits on 09/20/24.      Assessment & Plan:   Problem List Items Addressed This Visit   None Visit Diagnoses       Irritation of left eye    -  Primary       Meds ordered this encounter  Medications   DISCONTD: bacitracin -neomycin -polymyxin-hydrocortisone  (CORTISPORIN) 1 % ophthalmic ointment    Sig: Place 1 Application into the left eye 2 (two) times daily.    Dispense:  3.5 g    Refill:  0   bacitracin -neomycin -polymyxin-hydrocortisone  (CORTISPORIN) 1 % ophthalmic ointment    Sig: Place 1 Application into the left eye 2 (two) times daily.    Dispense:  3.5 g    Refill:  0   Call the office with any questions or concerns.  Recheck as scheduled or sooner as needed. No follow-ups on file.  Morry Veiga B Molina Hollenback, FNP       [1] Allergies Allergen Reactions   Aspirin Other (See Comments)    Causes Asthma   Peanut-Containing Drug Products Shortness Of Breath    Asthma, eye swelling, itchy eyes, can't catch her breath   Gabapentin Other (See Comments)    Blisters in mouth, nausea    Statins     Asthma, eye swelling, itchy eyes, can't catch her breath   Azithromycin Nausea And Vomiting  [2] Current Outpatient Medications on File Prior to Visit  Medication Sig Dispense Refill   albuterol  (VENTOLIN  HFA) 108 (90 Base) MCG/ACT inhaler INHALE 2 PUFFS INTO THE LUNGS EVERY 6 HOURS AS NEEDED FOR WHEEZING OR SHORTNESS OF BREATH 6.7 g 3   arformoterol  (BROVANA ) 15 MCG/2ML NEBU Substituted for: Brovana  Neb Solution Inhale one vial in nebulizer twice a day. 2 mL 11   budesonide   (PULMICORT ) 0.25 MG/2ML nebulizer solution Generic for Pulmicort . Inhale one vial in nebulizer twice a day. Rinse mouth after use. 60 mL 11   clotrimazole  (MYCELEX ) 10 MG troche Take 1 tablet (10 mg total) by mouth 5 (five) times daily. Allow to dissolve slowly 35 Troche 0   ipratropium (ATROVENT ) 0.02 % nebulizer solution Inhale one vial in nebulizer every 6 hours 120 mL 11   ipratropium (ATROVENT ) 0.03 % nasal spray USE 2 SPRAYS IN EACH NOSTRIL EVERY 12 HOURS 30 mL 12   levocetirizine (XYZAL) 2.5 MG/5ML solution Take 2.5 mg by mouth every evening.     losartan (COZAAR) 25 MG tablet Take 25 mg by mouth daily.     pantoprazole  (PROTONIX ) 40 MG tablet Take 40 mg by mouth daily.     tacrolimus  (PROGRAF ) 1 MG capsule Take 1 mg by mouth 2 (two) times a day. TAKE 2 TABLETS (3MG  TOTAL) IN THE MORNING AND TWO  TABLETS (2MG  TOTAL) IN THE EVENING.     terbinafine  (LAMISIL ) 1 % cream Apply 1 Application topically 2 (two) times daily. Apply to nail bed as it regrows over next 2-3 months     UNABLE TO FIND 25 mg. Med Name: Verapamil 25 mg daily     verapamil (CALAN-SR) 120 MG CR tablet Take 120 mg by mouth daily.     No current facility-administered medications on file prior to visit.  "

## 2024-09-20 NOTE — Telephone Encounter (Signed)
 Rx resent.

## 2024-09-20 NOTE — Telephone Encounter (Signed)
 FYI Only or Action Required?: FYI only for provider: appointment scheduled on 12/22.  Patient was last seen in primary care on 03/03/2024 by Copland, Harlene BROCKS, MD.  Called Nurse Triage reporting Eye Pain.  Symptoms began several days ago.  Interventions attempted: OTC medications: OTC eye wash.  Symptoms are: unchanged.  Triage Disposition: See HCP Within 4 Hours (Or PCP Triage)  Patient/caregiver understands and will follow disposition?: Yes  Copied from CRM #[id number]. Topic: Clinical - Red Word Triage >> Sep 20, 2024 10:43 AM Rea ORN wrote: Red Word that prompted transfer to Nurse Triage: left eye pain. Pt stated she had something in her eye and was able to get it out.Since then, the eye has been painful and gives her a headache. Reason for Disposition  MODERATE eye pain or discomfort (e.g., interferes with normal activities or awakens from sleep; more than mild)  Answer Assessment - Initial Assessment Questions 1. ONSET: When did the pain start? (e.g., minutes, hours, days)     3 days ago- felt she had something in her eye- used eye wash- did not clear- does not feel there is anything present now- but still having pain with blinking. Patient contacted eye doctor- but they did not have opening.  2. TIMING: Does the pain come and go, or has it been constant since it started? (e.g., constant, intermittent, fleeting)     Constant- every time blinks 3. SEVERITY: How bad is the pain?  (Scale 1-10; mild, moderate or severe)     5/10 4. LOCATION: Where does it hurt?  (e.g., eyelid, eye, cheekbone)     Left eye- in the eye- left side of eye and lid into the top and tearduct 5. CAUSE: What do you think is causing the pain?     Unsure- thought she had lint in eye- but not seeing anything 6. VISION: Do you have blurred vision or changes in your vision?      Slightly blurry 7. EYE DISCHARGE: Is there any discharge (pus) from the eye(s)?  If Yes, ask: What color is it?      It  does have excessive discharge in corner of the eye- crusty 8. FEVER: Do you have a fever? If Yes, ask: What is it, how was it measured, and when did it start?      no 9. OTHER SYMPTOMS: Do you have any other symptoms? (e.g., headache, nasal discharge, facial rash)     Headache- behind the eyes  Protocols used: Eye Pain and Other Symptoms-A-AH

## 2024-09-21 ENCOUNTER — Other Ambulatory Visit (HOSPITAL_BASED_OUTPATIENT_CLINIC_OR_DEPARTMENT_OTHER): Payer: Self-pay

## 2024-10-04 ENCOUNTER — Telehealth: Payer: Self-pay | Admitting: Pulmonary Disease

## 2024-10-04 NOTE — Telephone Encounter (Signed)
 Copied from CRM [id number]. Topic: General - Other >> Oct 04, 2024  9:29 AM Devaughn RAMAN wrote: Reason for CRM: Pt calling in regards to letter from refusal for Jury Duty pt stated she has to do her nebulizer treatment every 4 hours and due to cough and breathing, pt stated she has chronic cough and would like to be excused from this. Please f/u with pt regarding this.

## 2024-10-04 NOTE — Telephone Encounter (Addendum)
 I will call her and let her know its something she has to [place]     Letter has been written.  waiting on approval from Dr. Kara

## 2024-10-04 NOTE — Telephone Encounter (Signed)
 Letter at front desk ready to be picked up

## 2024-10-04 NOTE — Telephone Encounter (Signed)
 Please advise Dr. Kara ipt is requesting to be excused from [facility]

## 2024-10-27 ENCOUNTER — Ambulatory Visit: Admitting: Pulmonary Disease

## 2024-10-27 ENCOUNTER — Encounter: Payer: Self-pay | Admitting: Pulmonary Disease

## 2024-10-27 ENCOUNTER — Encounter: Payer: Self-pay | Admitting: Family Medicine

## 2024-10-27 VITALS — BP 140/78 | HR 71 | Ht 62.0 in | Wt 145.0 lb

## 2024-10-27 DIAGNOSIS — K219 Gastro-esophageal reflux disease without esophagitis: Secondary | ICD-10-CM

## 2024-10-27 DIAGNOSIS — J4489 Other specified chronic obstructive pulmonary disease: Secondary | ICD-10-CM

## 2024-10-27 DIAGNOSIS — J4550 Severe persistent asthma, uncomplicated: Secondary | ICD-10-CM | POA: Diagnosis not present

## 2024-10-27 DIAGNOSIS — R053 Chronic cough: Secondary | ICD-10-CM | POA: Diagnosis not present

## 2024-10-27 DIAGNOSIS — D869 Sarcoidosis, unspecified: Secondary | ICD-10-CM

## 2024-10-27 MED ORDER — IPRATROPIUM BROMIDE 0.02 % IN SOLN: 0.2500 mg | Freq: Four times a day (QID) | RESPIRATORY_TRACT | 11 refills | Status: AC

## 2024-10-27 MED ORDER — IPRATROPIUM BROMIDE 0.03 % NA SOLN: 2.0000 | Freq: Two times a day (BID) | NASAL | 12 refills | Status: AC

## 2024-10-27 MED ORDER — ARFORMOTEROL TARTRATE 15 MCG/2ML IN NEBU: 15.0000 ug | INHALATION_SOLUTION | Freq: Two times a day (BID) | RESPIRATORY_TRACT | 11 refills | Status: AC

## 2024-10-27 MED ORDER — BUDESONIDE 0.25 MG/2ML IN SUSP: 0.2500 mg | Freq: Two times a day (BID) | RESPIRATORY_TRACT | 11 refills | Status: AC

## 2024-10-27 MED ORDER — ALBUTEROL SULFATE HFA 108 (90 BASE) MCG/ACT IN AERS: 2.0000 | INHALATION_SPRAY | Freq: Four times a day (QID) | RESPIRATORY_TRACT | 11 refills | Status: AC | PRN

## 2024-10-27 NOTE — Assessment & Plan Note (Addendum)
" °  Orders:   ipratropium (ATROVENT ) 0.03 % nasal spray; Place 2 sprays into both nostrils 2 (two) times daily.  "

## 2024-10-27 NOTE — Assessment & Plan Note (Addendum)
 SABRA

## 2024-10-27 NOTE — Assessment & Plan Note (Addendum)
  Orders:   albuterol  (VENTOLIN  HFA) 108 (90 Base) MCG/ACT inhaler; Inhale 2 puffs into the lungs every 6 (six) hours as needed for wheezing or shortness of breath.

## 2024-10-27 NOTE — Progress Notes (Signed)
 "  Established Patient Pulmonology Office Visit   Subjective:  Patient ID: Alicia [name], female    DOB: 07-10-1961  MRN: [id number]  CC:  Chief Complaint  Patient presents with   Medical Management of Chronic Issues    Pt states     Discussed the use of AI scribe software for clinical note transcription with the patient, who gave verbal consent to proceed.  History of Present Illness Alicia [name] is a 64 year old woman, never smoker with GERD, congenital hepatic fibrosis s/p liver transplant, CKD III, chronic cough and asthma who returns for follow up.   She has chronic cough and chest tightness that worsens if she misses nebulizer treatments. She uses nebulized ipratropium, Pulmicort , and Brovana , plus ipratropium nasal spray twice daily, with no recent need for prednisone  or antibiotics.  She had high-resolution manometry with 24-hour monitoring that was inconclusive. She sometimes feels food stick in the mid-chest before it goes down and has had prior swallowing tests.  She has persistent hoarseness and known vocal fold damage. She receives superior laryngeal nerve injections for laryngeal sensitivity and takes amitriptyline  at night.  She developed dry mouth about six months ago, uses lozenges and xylitol products, and recently started amitriptyline  and amlodipine. She also takes low-dose verapamil for blood pressure.  She had reverse shoulder replacement three weeks ago and is in a sling outside the home. She avoids heavy lifting and has modified exercise.  She remains active with gardening. She has intermittent dry mouth and occasional coughing fits that can cause transient temperature elevation. She has had no recent colds or sustained fevers.        ROS   Current Medications[1]      Objective:  BP (!) 140/78   Pulse 71   Ht 5' 2 (1.575 m) Comment: per pt  Wt 145 lb (65.8 kg)   SpO2 96%   BMI 26.52 kg/m     Physical Exam Constitutional:      General:  She is not in acute distress.    Appearance: Normal appearance.  Eyes:     General: No scleral icterus.    Conjunctiva/sclera: Conjunctivae normal.  Cardiovascular:     Rate and Rhythm: Normal rate and regular rhythm.  Pulmonary:     Breath sounds: No wheezing, rhonchi or rales.  Musculoskeletal:     Right lower leg: No edema.     Left lower leg: No edema.  Skin:    General: Skin is warm and dry.  Neurological:     General: No focal deficit present.      Diagnostic Review:  Last CBC Lab Results  Component Value Date   WBC 4.3 07/07/2024   HGB 12.5 07/07/2024   HCT 36.7 07/07/2024   MCV 89.6 07/07/2024   MCH 31.0 07/12/2020   RDW 13.1 07/07/2024   PLT 196.0 07/07/2024   Last metabolic panel Lab Results  Component Value Date   GLUCOSE 111 (H) 07/07/2024   NA 140 07/07/2024   K 4.6 07/07/2024   CL 106 07/07/2024   CO2 26 07/07/2024   BUN 16 07/07/2024   CREATININE 1.69 (H) 07/07/2024   GFR 31.47 (L) 07/07/2024   CALCIUM  8.9 07/07/2024   PROT 6.4 07/07/2024   ALBUMIN 4.4 07/07/2024   LABGLOB 1.5 09/21/2018   AGRATIO 2.9 (H) 09/21/2018   BILITOT 0.6 07/07/2024   ALKPHOS 60 07/07/2024   AST 9 07/07/2024   ALT 6 07/07/2024   ANIONGAP 7 08/16/2017  Assessment & Plan:   Assessment & Plan Asthma-COPD overlap syndrome (HCC)  Orders:   arformoterol  (BROVANA ) 15 MCG/2ML NEBU; Take 2 mLs (15 mcg total) by nebulization 2 (two) times daily.   budesonide  (PULMICORT ) 0.25 MG/2ML nebulizer solution; Take 2 mLs (0.25 mg total) by nebulization 2 (two) times daily.   ipratropium (ATROVENT ) 0.02 % nebulizer solution; Take 1.25 mLs (0.25 mg total) by nebulization every 6 (six) hours.  Severe persistent asthma without complication (HCC)  Orders:   albuterol  (VENTOLIN  HFA) 108 (90 Base) MCG/ACT inhaler; Inhale 2 puffs into the lungs every 6 (six) hours as needed for wheezing or shortness of breath.  Sarcoidosis     Chronic cough  Orders:   ipratropium  (ATROVENT ) 0.03 % nasal spray; Place 2 sprays into both nostrils 2 (two) times daily.  Gastroesophageal reflux disease without esophagitis      Assessment and Plan Assessment & Plan Asthma-COPD overlap syndrome Managed with nebulizer treatments. Symptoms include heaviness and tightness in the chest. No recent need for prednisone  or antibiotics. Dyspnea on exertion persists. - Continue nebulizer treatments with ipratropium, Pulmicort , and Brovana . - Refilled albuterol  rescue inhaler. - Monitor symptoms and adjust treatment as needed.  Severe persistent asthma Managed with current nebulizer regimen. No recent exacerbations requiring prednisone  or antibiotics. Dyspnea on exertion persists. - Continue current asthma management plan with nebulizer treatments. - Monitor for any changes in symptoms or need for additional medications.  Chronic cough Persists, possibly related to swallowing issues and vocal fold damage. Recent interventions include SLN shots and amitriptyline . Dry mouth noted, possibly exacerbated by ipratropium nasal spray. - Continue amitriptyline  at night to manage nerve sensitivity. - Reduce ipratropium nasal spray to one spray twice a day and monitor for changes in dry mouth. - Consider using ipratropium nasal spray as needed if symptoms worsen.      Return in about 1 year (around 10/27/2025) for f/u visit Dr. Kara.   Dorn KATHEE Kara, MD     [1]  Current Outpatient Medications:    amLODipine (NORVASC) 5 MG tablet, Take 5 mg by mouth daily., Disp: , Rfl:    bacitracin -neomycin -polymyxin-hydrocortisone  (CORTISPORIN) 1 % ophthalmic ointment, Place 1 Application into the left eye 2 (two) times daily., Disp: 3.5 g, Rfl: 0   clotrimazole  (MYCELEX ) 10 MG troche, Take 1 tablet (10 mg total) by mouth 5 (five) times daily. Allow to dissolve slowly, Disp: 35 Troche, Rfl: 0   levocetirizine (XYZAL) 2.5 MG/5ML solution, Take 2.5 mg by mouth every evening., Disp: , Rfl:     pantoprazole  (PROTONIX ) 40 MG tablet, Take 40 mg by mouth daily., Disp: , Rfl:    tacrolimus  (PROGRAF ) 1 MG capsule, Take 1 mg by mouth 2 (two) times a day. TAKE 2 TABLETS (3MG  TOTAL) IN THE MORNING AND TWO  TABLETS (2MG  TOTAL) IN THE EVENING., Disp: , Rfl:    terbinafine  (LAMISIL ) 1 % cream, Apply 1 Application topically 2 (two) times daily. Apply to nail bed as it regrows over next 2-3 months, Disp: , Rfl:    UNABLE TO FIND, 25 mg. Med Name: Verapamil 25 mg daily, Disp: , Rfl:    verapamil (CALAN-SR) 120 MG CR tablet, Take 120 mg by mouth daily., Disp: , Rfl:    albuterol  (VENTOLIN  HFA) 108 (90 Base) MCG/ACT inhaler, Inhale 2 puffs into the lungs every 6 (six) hours as needed for wheezing or shortness of breath., Disp: 8 g, Rfl: 11   arformoterol  (BROVANA ) 15 MCG/2ML NEBU, Take 2 mLs (15 mcg total) by  nebulization 2 (two) times daily., Disp: 120 mL, Rfl: 11   budesonide  (PULMICORT ) 0.25 MG/2ML nebulizer solution, Take 2 mLs (0.25 mg total) by nebulization 2 (two) times daily., Disp: 120 mL, Rfl: 11   ipratropium (ATROVENT ) 0.02 % nebulizer solution, Take 1.25 mLs (0.25 mg total) by nebulization every 6 (six) hours., Disp: 150 mL, Rfl: 11   ipratropium (ATROVENT ) 0.03 % nasal spray, Place 2 sprays into both nostrils 2 (two) times daily., Disp: 30 mL, Rfl: 12   losartan (COZAAR) 25 MG tablet, Take 25 mg by mouth daily. (Patient not taking: Reported on 10/27/2024), Disp: , Rfl:   "

## 2024-10-27 NOTE — Patient Instructions (Addendum)
 Continue budesonide  nebs twice daily  Continue brovana  nebs twice daily  Continue ipratropium nebs every 6 hours  Continue ipratropium nasal spray twice daily as needed - consider backing off this therapy if as it could be contributing to dry mouth  Continue Xyzal daily for allergies  Follow up in 1 year, call sooner if needed

## 2024-10-28 ENCOUNTER — Encounter: Payer: Self-pay | Admitting: Family Medicine

## 2024-10-28 ENCOUNTER — Ambulatory Visit (INDEPENDENT_AMBULATORY_CARE_PROVIDER_SITE_OTHER): Admitting: Family Medicine

## 2024-10-28 VITALS — BP 116/80 | HR 82 | Temp 98.7°F | Ht 62.0 in | Wt 145.2 lb

## 2024-10-28 DIAGNOSIS — R300 Dysuria: Secondary | ICD-10-CM

## 2024-10-28 LAB — POCT URINALYSIS DIP (MANUAL ENTRY)
Bilirubin, UA: NEGATIVE
Blood, UA: NEGATIVE
Glucose, UA: NEGATIVE mg/dL
Ketones, POC UA: NEGATIVE mg/dL
Nitrite, UA: NEGATIVE
Protein Ur, POC: NEGATIVE mg/dL
Spec Grav, UA: <=1.005 — AB
Urobilinogen, UA: 0.2 U/dL
pH, UA: 7

## 2024-10-28 MED ORDER — CEPHALEXIN 500 MG PO CAPS: 500.0000 mg | ORAL_CAPSULE | Freq: Two times a day (BID) | ORAL | 0 refills | Status: AC

## 2024-10-28 NOTE — Progress Notes (Signed)
 [facility] [address] [place], [place] [address] [phone]  Date:  10/28/2024   Name:  Alicia [name]   DOB:  [birth year]   MRN:  [id number]  PCP:  Watt Harlene BROCKS, MD    Chief Complaint: Urinary Tract Infection (Onset a few days ago, urgency and frequency)   History of Present Illness:  Alicia [name] is a 65 y.o. very pleasant female patient who presents with the following:  Pt seen today with concern of UTI She contacted me this am via mychart with concern of dysuria  Last seen by me in June  She had a liver transplant in 2019 for congenital hepatic fibrosis. Also history of osteoporosis, chronic kidney disease, adrenal insufficiency, portal hypertension, asthma, GERD, sarcoidosis  She sees endocrinology annually for her adrenal insufficiency  She also has pulmonology care  Discussed the use of AI scribe software for clinical note transcription with the patient, who gave verbal consent to proceed.  History of Present Illness Alicia [name] is a 64 year old female who presents with symptoms of a urinary tract infection.  She has been experiencing symptoms consistent with a urinary tract infection for the past three days, including frequent urination, nocturia with at least six episodes per night, and a persistent sensation of incomplete bladder emptying. There is no hematuria. She experienced a low-grade fever over the weekend, which has since resolved- she thinks this was due to a different viral URI as opposed to her current urinary sx. No back pain or vomiting is reported.  She has a known intolerance to azithromycin, which causes vomiting shortly after ingestion. She recalls a previous incident where she vomited within fifteen minutes of taking the medication, with the capsule still visible in the vomit.  She mentions the recent loss of both her parents, with her mother passing away last Christmas and  her father on December 8th of the previous year. She has been busy with estate matters following her parents' passing, leaving little time for leisure activities.    Patient Active Problem List   Diagnosis Date Noted   Osteoporosis without current pathological fracture 05/25/2018   Status post liver transplant (HCC) 12/25/2017   Syncope 07/17/2017   Other cirrhosis of liver (HCC) 05/10/2017   Immunocompromised patient 05/10/2017   Mucous cyst of digit of left hand 04/11/2017   Cellulitis, abdominal wall 03/01/2017   Normocytic anemia 03/01/2017   Hypokalemia 03/01/2017   CKD (chronic kidney disease), stage III (HCC) 03/01/2017   Abdominal wall cellulitis 03/01/2017   Adrenal insufficiency 03/01/2017   Hyperammonemia    Cellulitis 06/28/2016   Thrombocytopenia 06/28/2016   AKI (acute kidney injury) 06/28/2016   Multiple pulmonary nodules determined by computed tomography of lung    Chronic cough 09/07/2015   Hypertension, portal (HCC) 09/04/2011   Asthma with chronic obstructive pulmonary disease (COPD) (HCC) 09/04/2011   Vocal cord nodules 07/04/2011   GERD (gastroesophageal reflux disease) 07/04/2011   Sarcoidosis 07/01/2011   Asthma, severe persistent, with upper airway instability 06/13/2011   Congenital hepatic fibrosis 07/03/2000    Past Medical History:  Diagnosis Date   Allergy    Anemia    Asthma    Cellulitis    Cirrhosis (HCC)    GERD (gastroesophageal reflux disease)    Hepatic fibrosis    congenital   History of UTI    chronic as a child   Sarcoidosis    Spleen enlarged  Past Surgical History:  Procedure Laterality Date   COLONOSCOPY     CYST EXCISION Left 05/01/2017   Procedure: LEFT INDEX FINGER CYST EXCISION;  Surgeon: Jerri Kay HERO, MD;  Location: [facility] OR;  Service: Orthopedics;  Laterality: Left;   ESOPHAGOGASTRODUODENOSCOPY     LIVER TRANSPLANT     SPINAL FUSION  12/2007   Double spinal Fusion   VIDEO BRONCHOSCOPY Bilateral 12/08/2015    Procedure: VIDEO BRONCHOSCOPY WITH FLUORO;  Surgeon: Harden Jude GAILS, MD;  Location: [facility] ENDOSCOPY;  Service: Cardiopulmonary;  Laterality: Bilateral;    Social History[1]  Family History  Problem Relation Age of Onset   Arthritis Mother    Heart disease Father        Triple bypass   Migraines Father    Heart attack Paternal Grandmother    Stroke Paternal Grandfather    Liver disease Sister        Juvenile cysitc liver disease   Asthma Sister     Allergies[2]  Medication list has been reviewed and updated.  Medications Ordered Prior to Encounter[3]  Review of Systems:  As per HPI- otherwise negative.   Physical Examination: Vitals:   10/28/24 0950  BP: 116/80  Pulse: 82  SpO2: 97%   Vitals:   10/28/24 0950  Weight: 145 lb 3.2 oz (65.9 kg)  Height: 5' 2 (1.575 m)   Body mass index is 26.56 kg/m. Ideal Body Weight: Weight in (lb) to have BMI = 25: 136.4  GEN: no acute distress. HEENT: Atraumatic, Normocephalic.  Ears and Nose: No external deformity. CV: RRR, No M/G/R. No JVD. No thrill. No extra heart sounds. PULM: CTA B, no wheezes, crackles, rhonchi. No retractions. No resp. distress. No accessory muscle use. ABD: S, NT, ND No rebound.  No CVA tenderness  EXTR: No c/c/e PSYCH: Normally interactive. Conversant.   Results for orders placed or performed in visit on 10/28/24  POCT urinalysis dipstick   Collection Time: 10/28/24 10:04 AM  Result Value Ref Range   Color, UA yellow yellow   Clarity, UA cloudy (A) clear   Glucose, UA negative negative mg/dL   Bilirubin, UA negative negative   Ketones, POC UA negative negative mg/dL   Spec Grav, UA <=8.994 (A) 1.010 - 1.025   Blood, UA negative negative   pH, UA 7.0 5.0 - 8.0   Protein Ur, POC negative negative mg/dL   Urobilinogen, UA 0.2 0.2 or 1.0 E.U./dL   Nitrite, UA Negative Negative   Leukocytes, UA Large (3+) (A) Negative     Assessment and Plan: Dysuria - Plan: Urine Culture, POCT urinalysis  dipstick  Assessment & Plan Acute urinary tract infection Symptoms of frequent urination and incomplete bladder emptying for three days. No hematuria, fever, or back pain currently. Low-grade fever over the weekend resolved. No antibiotic allergy except for azithromycin, which causes severe vomiting. - Sent urine for culture. - Prescribed Keflex  and sent prescription  - gave a 7 day rx but advised 5 days is enough is sx resolved - Instructed to report if symptoms worsen or do not improve within a few days.  Signed Harlene Schroeder, MD     [1]  Social History Tobacco Use   Smoking status: Never   Smokeless tobacco: Never  Substance Use Topics   Alcohol use: No   Drug use: No  [2]  Allergies Allergen Reactions   Aspirin Other (See Comments)    Causes Asthma   Peanut-Containing Drug Products Shortness Of Breath    Asthma, eye  swelling, itchy eyes, can't catch her breath   Gabapentin Other (See Comments)    Blisters in mouth, nausea    Statins     Asthma, eye swelling, itchy eyes, can't catch her breath   Azithromycin Nausea And Vomiting  [3]  Current Outpatient Medications on File Prior to Visit  Medication Sig Dispense Refill   albuterol  (VENTOLIN  HFA) 108 (90 Base) MCG/ACT inhaler Inhale 2 puffs into the lungs every 6 (six) hours as needed for wheezing or shortness of breath. 8 g 11   amLODipine (NORVASC) 5 MG tablet Take 5 mg by mouth daily.     arformoterol  (BROVANA ) 15 MCG/2ML NEBU Take 2 mLs (15 mcg total) by nebulization 2 (two) times daily. 120 mL 11   bacitracin -neomycin -polymyxin-hydrocortisone  (CORTISPORIN) 1 % ophthalmic ointment Place 1 Application into the left eye 2 (two) times daily. 3.5 g 0   budesonide  (PULMICORT ) 0.25 MG/2ML nebulizer solution Take 2 mLs (0.25 mg total) by nebulization 2 (two) times daily. 120 mL 11   clotrimazole  (MYCELEX ) 10 MG troche Take 1 tablet (10 mg total) by mouth 5 (five) times daily. Allow to dissolve slowly 35 Troche 0    ipratropium (ATROVENT ) 0.02 % nebulizer solution Take 1.25 mLs (0.25 mg total) by nebulization every 6 (six) hours. 150 mL 11   ipratropium (ATROVENT ) 0.03 % nasal spray Place 2 sprays into both nostrils 2 (two) times daily. 30 mL 12   levocetirizine (XYZAL) 2.5 MG/5ML solution Take 2.5 mg by mouth every evening.     pantoprazole  (PROTONIX ) 40 MG tablet Take 40 mg by mouth daily.     tacrolimus  (PROGRAF ) 1 MG capsule Take 1 mg by mouth 2 (two) times a day. TAKE 2 TABLETS (3MG  TOTAL) IN THE MORNING AND TWO  TABLETS (2MG  TOTAL) IN THE EVENING.     terbinafine  (LAMISIL ) 1 % cream Apply 1 Application topically 2 (two) times daily. Apply to nail bed as it regrows over next 2-3 months     UNABLE TO FIND 25 mg. Med Name: Verapamil 25 mg daily     verapamil (CALAN-SR) 120 MG CR tablet Take 120 mg by mouth daily.     losartan (COZAAR) 25 MG tablet Take 25 mg by mouth daily. (Patient not taking: Reported on 10/27/2024)     No current facility-administered medications on file prior to visit.   "

## 2024-10-29 LAB — URINE CULTURE
MICRO NUMBER:: 17526661
Result:: NO GROWTH
SPECIMEN QUALITY:: ADEQUATE

## 2024-10-30 ENCOUNTER — Encounter: Payer: Self-pay | Admitting: Family Medicine

## 2024-11-17 ENCOUNTER — Ambulatory Visit: Admitting: Pulmonary Disease
